# Patient Record
Sex: Female | Born: 1950
Health system: Southern US, Community
[De-identification: ages and names within clinical notes are randomized; demographics above are authoritative.]

## PROBLEM LIST (undated history)

## (undated) DIAGNOSIS — I639 Cerebral infarction, unspecified: Secondary | ICD-10-CM

## (undated) DIAGNOSIS — K9089 Other intestinal malabsorption: Secondary | ICD-10-CM

## (undated) DIAGNOSIS — I10 Essential (primary) hypertension: Secondary | ICD-10-CM

## (undated) DIAGNOSIS — D6800 Von Willebrand disease, unspecified: Secondary | ICD-10-CM

## (undated) DIAGNOSIS — T4145XA Adverse effect of unspecified anesthetic, initial encounter: Secondary | ICD-10-CM

## (undated) DIAGNOSIS — T7840XA Allergy, unspecified, initial encounter: Secondary | ICD-10-CM

## (undated) DIAGNOSIS — T8859XA Other complications of anesthesia, initial encounter: Secondary | ICD-10-CM

## (undated) DIAGNOSIS — M5410 Radiculopathy, site unspecified: Secondary | ICD-10-CM

## (undated) DIAGNOSIS — M797 Fibromyalgia: Secondary | ICD-10-CM

## (undated) DIAGNOSIS — D689 Coagulation defect, unspecified: Secondary | ICD-10-CM

## (undated) DIAGNOSIS — K219 Gastro-esophageal reflux disease without esophagitis: Secondary | ICD-10-CM

## (undated) DIAGNOSIS — F419 Anxiety disorder, unspecified: Secondary | ICD-10-CM

## (undated) DIAGNOSIS — D759 Disease of blood and blood-forming organs, unspecified: Secondary | ICD-10-CM

## (undated) DIAGNOSIS — C50919 Malignant neoplasm of unspecified site of unspecified female breast: Secondary | ICD-10-CM

## (undated) DIAGNOSIS — M1990 Unspecified osteoarthritis, unspecified site: Secondary | ICD-10-CM

## (undated) DIAGNOSIS — Z5189 Encounter for other specified aftercare: Secondary | ICD-10-CM

## (undated) DIAGNOSIS — E7800 Pure hypercholesterolemia, unspecified: Secondary | ICD-10-CM

## (undated) HISTORY — PX: SHOULDER SURGERY: SHX246

## (undated) HISTORY — PX: COSMETIC SURGERY: SHX468

## (undated) HISTORY — DX: Radiculopathy, site unspecified: M54.10

## (undated) HISTORY — PX: AUGMENTATION MAMMAPLASTY: SUR837

## (undated) HISTORY — DX: Malignant neoplasm of unspecified site of unspecified female breast: C50.919

## (undated) HISTORY — DX: Gastro-esophageal reflux disease without esophagitis: K21.9

## (undated) HISTORY — PX: HEMORRHOID SURGERY: SHX153

## (undated) HISTORY — DX: Fibromyalgia: M79.7

## (undated) HISTORY — PX: CARPAL TUNNEL RELEASE: SHX101

## (undated) HISTORY — PX: SKIN CANCER EXCISION: SHX779

## (undated) HISTORY — DX: Encounter for other specified aftercare: Z51.89

## (undated) HISTORY — PX: MASTECTOMY: SHX3

## (undated) HISTORY — DX: Anxiety disorder, unspecified: F41.9

## (undated) HISTORY — DX: Other intestinal malabsorption: K90.89

## (undated) HISTORY — DX: Von Willebrand disease, unspecified: D68.00

## (undated) HISTORY — DX: Von Willebrand's disease: D68.0

## (undated) HISTORY — PX: FINGER SURGERY: SHX640

## (undated) HISTORY — PX: LAMINECTOMY: SHX219

## (undated) HISTORY — PX: SPINE SURGERY: SHX786

## (undated) HISTORY — PX: BACK SURGERY: SHX140

## (undated) HISTORY — DX: Allergy, unspecified, initial encounter: T78.40XA

## (undated) HISTORY — PX: ABDOMINAL HYSTERECTOMY: SHX81

## (undated) HISTORY — PX: TONSILLECTOMY: SUR1361

## (undated) HISTORY — DX: Cerebral infarction, unspecified: I63.9

## (undated) HISTORY — PX: TUBAL LIGATION: SHX77

## (undated) HISTORY — DX: Coagulation defect, unspecified: D68.9

## (undated) HISTORY — DX: Pure hypercholesterolemia, unspecified: E78.00

---

## 1997-10-19 ENCOUNTER — Other Ambulatory Visit: Admission: RE | Admit: 1997-10-19 | Discharge: 1997-10-19 | Payer: Self-pay | Admitting: *Deleted

## 1998-10-27 ENCOUNTER — Other Ambulatory Visit: Admission: RE | Admit: 1998-10-27 | Discharge: 1998-10-27 | Payer: Self-pay | Admitting: *Deleted

## 1999-10-24 ENCOUNTER — Other Ambulatory Visit: Admission: RE | Admit: 1999-10-24 | Discharge: 1999-10-24 | Payer: Self-pay | Admitting: *Deleted

## 2000-06-21 ENCOUNTER — Encounter: Payer: Self-pay | Admitting: Neurological Surgery

## 2000-06-21 ENCOUNTER — Ambulatory Visit (HOSPITAL_COMMUNITY): Admission: RE | Admit: 2000-06-21 | Discharge: 2000-06-21 | Payer: Self-pay | Admitting: Neurological Surgery

## 2000-10-23 ENCOUNTER — Ambulatory Visit (HOSPITAL_COMMUNITY): Admission: RE | Admit: 2000-10-23 | Discharge: 2000-10-23 | Payer: Self-pay | Admitting: *Deleted

## 2000-10-23 ENCOUNTER — Encounter: Payer: Self-pay | Admitting: *Deleted

## 2000-10-28 ENCOUNTER — Other Ambulatory Visit: Admission: RE | Admit: 2000-10-28 | Discharge: 2000-10-28 | Payer: Self-pay | Admitting: *Deleted

## 2001-10-23 ENCOUNTER — Encounter: Payer: Self-pay | Admitting: *Deleted

## 2001-10-23 ENCOUNTER — Ambulatory Visit (HOSPITAL_COMMUNITY): Admission: RE | Admit: 2001-10-23 | Discharge: 2001-10-23 | Payer: Self-pay | Admitting: *Deleted

## 2001-10-28 ENCOUNTER — Other Ambulatory Visit: Admission: RE | Admit: 2001-10-28 | Discharge: 2001-10-28 | Payer: Self-pay | Admitting: *Deleted

## 2001-12-19 ENCOUNTER — Ambulatory Visit (HOSPITAL_COMMUNITY): Admission: RE | Admit: 2001-12-19 | Discharge: 2001-12-19 | Payer: Self-pay | Admitting: General Surgery

## 2002-10-26 ENCOUNTER — Ambulatory Visit (HOSPITAL_COMMUNITY): Admission: RE | Admit: 2002-10-26 | Discharge: 2002-10-26 | Payer: Self-pay | Admitting: *Deleted

## 2002-10-26 ENCOUNTER — Encounter: Payer: Self-pay | Admitting: *Deleted

## 2002-11-02 ENCOUNTER — Other Ambulatory Visit: Admission: RE | Admit: 2002-11-02 | Discharge: 2002-11-02 | Payer: Self-pay | Admitting: *Deleted

## 2002-12-22 ENCOUNTER — Encounter: Payer: Self-pay | Admitting: Family Medicine

## 2002-12-22 ENCOUNTER — Ambulatory Visit (HOSPITAL_COMMUNITY): Admission: RE | Admit: 2002-12-22 | Discharge: 2002-12-22 | Payer: Self-pay | Admitting: Family Medicine

## 2003-05-03 ENCOUNTER — Ambulatory Visit (HOSPITAL_COMMUNITY): Admission: RE | Admit: 2003-05-03 | Discharge: 2003-05-03 | Payer: Self-pay | Admitting: Neurological Surgery

## 2003-10-28 ENCOUNTER — Ambulatory Visit (HOSPITAL_COMMUNITY): Admission: RE | Admit: 2003-10-28 | Discharge: 2003-10-28 | Payer: Self-pay | Admitting: *Deleted

## 2003-11-04 ENCOUNTER — Other Ambulatory Visit: Admission: RE | Admit: 2003-11-04 | Discharge: 2003-11-04 | Payer: Self-pay | Admitting: *Deleted

## 2004-10-30 ENCOUNTER — Ambulatory Visit (HOSPITAL_COMMUNITY): Admission: RE | Admit: 2004-10-30 | Discharge: 2004-10-30 | Payer: Self-pay | Admitting: *Deleted

## 2004-11-16 ENCOUNTER — Other Ambulatory Visit: Admission: RE | Admit: 2004-11-16 | Discharge: 2004-11-16 | Payer: Self-pay | Admitting: *Deleted

## 2004-12-20 ENCOUNTER — Ambulatory Visit (HOSPITAL_COMMUNITY): Admission: RE | Admit: 2004-12-20 | Discharge: 2004-12-20 | Payer: Self-pay | Admitting: Otolaryngology

## 2004-12-21 ENCOUNTER — Ambulatory Visit: Payer: Self-pay | Admitting: Internal Medicine

## 2005-02-01 ENCOUNTER — Ambulatory Visit: Payer: Self-pay | Admitting: Internal Medicine

## 2005-02-06 ENCOUNTER — Ambulatory Visit: Payer: Self-pay | Admitting: Internal Medicine

## 2005-02-06 ENCOUNTER — Ambulatory Visit (HOSPITAL_COMMUNITY): Admission: RE | Admit: 2005-02-06 | Discharge: 2005-02-06 | Payer: Self-pay | Admitting: Internal Medicine

## 2005-03-13 ENCOUNTER — Ambulatory Visit: Payer: Self-pay | Admitting: Internal Medicine

## 2005-04-23 ENCOUNTER — Ambulatory Visit: Payer: Self-pay | Admitting: Internal Medicine

## 2005-04-27 ENCOUNTER — Ambulatory Visit: Payer: Self-pay | Admitting: Internal Medicine

## 2005-07-23 ENCOUNTER — Ambulatory Visit: Payer: Self-pay | Admitting: Internal Medicine

## 2005-11-16 ENCOUNTER — Ambulatory Visit (HOSPITAL_COMMUNITY): Admission: RE | Admit: 2005-11-16 | Discharge: 2005-11-16 | Payer: Self-pay | Admitting: *Deleted

## 2005-11-20 ENCOUNTER — Other Ambulatory Visit: Admission: RE | Admit: 2005-11-20 | Discharge: 2005-11-20 | Payer: Self-pay | Admitting: *Deleted

## 2005-11-29 ENCOUNTER — Ambulatory Visit (HOSPITAL_COMMUNITY): Admission: RE | Admit: 2005-11-29 | Discharge: 2005-11-29 | Payer: Self-pay | Admitting: Family Medicine

## 2005-12-26 ENCOUNTER — Ambulatory Visit: Payer: Self-pay | Admitting: Orthopedic Surgery

## 2006-01-09 ENCOUNTER — Ambulatory Visit: Payer: Self-pay | Admitting: Orthopedic Surgery

## 2006-01-14 ENCOUNTER — Ambulatory Visit (HOSPITAL_COMMUNITY): Admission: RE | Admit: 2006-01-14 | Discharge: 2006-01-14 | Payer: Self-pay | Admitting: Orthopedic Surgery

## 2006-01-24 ENCOUNTER — Ambulatory Visit: Payer: Self-pay | Admitting: Orthopedic Surgery

## 2006-02-07 ENCOUNTER — Ambulatory Visit: Payer: Self-pay | Admitting: Orthopedic Surgery

## 2006-02-13 ENCOUNTER — Ambulatory Visit (HOSPITAL_COMMUNITY): Admission: RE | Admit: 2006-02-13 | Discharge: 2006-02-13 | Payer: Self-pay | Admitting: Orthopedic Surgery

## 2006-02-15 ENCOUNTER — Ambulatory Visit: Payer: Self-pay | Admitting: Internal Medicine

## 2006-03-26 ENCOUNTER — Encounter (HOSPITAL_COMMUNITY): Admission: RE | Admit: 2006-03-26 | Discharge: 2006-04-25 | Payer: Self-pay | Admitting: Neurological Surgery

## 2006-08-01 ENCOUNTER — Ambulatory Visit (HOSPITAL_BASED_OUTPATIENT_CLINIC_OR_DEPARTMENT_OTHER): Admission: RE | Admit: 2006-08-01 | Discharge: 2006-08-02 | Payer: Self-pay | Admitting: Orthopedic Surgery

## 2006-12-06 ENCOUNTER — Ambulatory Visit (HOSPITAL_COMMUNITY): Admission: RE | Admit: 2006-12-06 | Discharge: 2006-12-06 | Payer: Self-pay | Admitting: Obstetrics and Gynecology

## 2006-12-25 ENCOUNTER — Other Ambulatory Visit: Admission: RE | Admit: 2006-12-25 | Discharge: 2006-12-25 | Payer: Self-pay | Admitting: *Deleted

## 2007-06-20 ENCOUNTER — Encounter: Payer: Self-pay | Admitting: Orthopedic Surgery

## 2007-09-09 ENCOUNTER — Ambulatory Visit (HOSPITAL_COMMUNITY): Admission: RE | Admit: 2007-09-09 | Discharge: 2007-09-09 | Payer: Self-pay | Admitting: Family Medicine

## 2008-05-21 LAB — HM COLONOSCOPY

## 2009-03-31 ENCOUNTER — Ambulatory Visit (HOSPITAL_COMMUNITY): Admission: RE | Admit: 2009-03-31 | Discharge: 2009-03-31 | Payer: Self-pay | Admitting: General Surgery

## 2009-03-31 ENCOUNTER — Encounter (INDEPENDENT_AMBULATORY_CARE_PROVIDER_SITE_OTHER): Payer: Self-pay | Admitting: General Surgery

## 2009-04-29 ENCOUNTER — Encounter (INDEPENDENT_AMBULATORY_CARE_PROVIDER_SITE_OTHER): Payer: Self-pay | Admitting: General Surgery

## 2009-04-29 ENCOUNTER — Inpatient Hospital Stay (HOSPITAL_COMMUNITY): Admission: RE | Admit: 2009-04-29 | Discharge: 2009-05-01 | Payer: Self-pay | Admitting: General Surgery

## 2009-06-20 ENCOUNTER — Ambulatory Visit (HOSPITAL_COMMUNITY): Payer: Self-pay | Admitting: Oncology

## 2009-09-01 ENCOUNTER — Ambulatory Visit (HOSPITAL_BASED_OUTPATIENT_CLINIC_OR_DEPARTMENT_OTHER): Admission: RE | Admit: 2009-09-01 | Discharge: 2009-09-02 | Payer: Self-pay | Admitting: Plastic Surgery

## 2009-09-26 ENCOUNTER — Ambulatory Visit (HOSPITAL_COMMUNITY): Payer: Self-pay | Admitting: Oncology

## 2010-01-10 ENCOUNTER — Ambulatory Visit (HOSPITAL_BASED_OUTPATIENT_CLINIC_OR_DEPARTMENT_OTHER): Admission: RE | Admit: 2010-01-10 | Discharge: 2010-01-10 | Payer: Self-pay | Admitting: Plastic Surgery

## 2010-01-19 HISTORY — PX: COLONOSCOPY: SHX174

## 2010-02-14 ENCOUNTER — Ambulatory Visit (HOSPITAL_COMMUNITY): Admission: RE | Admit: 2010-02-14 | Discharge: 2010-02-14 | Payer: Self-pay | Admitting: General Surgery

## 2010-02-18 LAB — HM MAMMOGRAPHY

## 2010-03-06 LAB — HM PAP SMEAR: HM Pap smear: NORMAL

## 2010-03-31 ENCOUNTER — Ambulatory Visit
Admission: RE | Admit: 2010-03-31 | Discharge: 2010-03-31 | Payer: Self-pay | Source: Home / Self Care | Admitting: Plastic Surgery

## 2010-06-12 ENCOUNTER — Encounter: Payer: Self-pay | Admitting: Family Medicine

## 2010-06-19 ENCOUNTER — Encounter (HOSPITAL_COMMUNITY): Admission: RE | Admit: 2010-06-19 | Payer: Self-pay | Source: Home / Self Care | Admitting: Oncology

## 2010-06-26 ENCOUNTER — Ambulatory Visit (HOSPITAL_COMMUNITY): Admitting: Oncology

## 2010-06-26 DIAGNOSIS — C50919 Malignant neoplasm of unspecified site of unspecified female breast: Secondary | ICD-10-CM

## 2010-08-01 LAB — TYPE AND SCREEN
ABO/RH(D): A POS
Antibody Screen: POSITIVE
Donor AG Type: NEGATIVE
Unit division: 0

## 2010-08-01 LAB — POCT HEMOGLOBIN-HEMACUE: Hemoglobin: 13.7 g/dL (ref 12.0–15.0)

## 2010-08-03 LAB — TYPE AND SCREEN
ABO/RH(D): A POS
Antibody Screen: POSITIVE
Donor AG Type: NEGATIVE
Donor AG Type: NEGATIVE

## 2010-08-09 LAB — POCT HEMOGLOBIN-HEMACUE: Hemoglobin: 13.9 g/dL (ref 12.0–15.0)

## 2010-08-09 LAB — TYPE AND SCREEN
ABO/RH(D): A POS
Antibody Screen: POSITIVE
Donor AG Type: NEGATIVE
Donor AG Type: NEGATIVE

## 2010-08-22 LAB — CROSSMATCH
ABO/RH(D): A POS
Antibody Screen: POSITIVE
DAT, IgG: NEGATIVE
Donor AG Type: NEGATIVE
Donor AG Type: NEGATIVE
Donor AG Type: NEGATIVE
PT AG Type: NEGATIVE

## 2010-08-22 LAB — BASIC METABOLIC PANEL
GFR calc Af Amer: >60 mL/min (ref >60)
Glucose, Bld: 103 mg/dL — ABNORMAL HIGH (ref 70–99)

## 2010-08-22 LAB — DIFFERENTIAL
Basophils Relative: 0 % (ref 0–1)
Basophils Relative: 0 % (ref 0–1)
Eosinophils Absolute: 0.3 10*3/uL (ref 0.0–0.7)
Eosinophils Absolute: 0.5 10*3/uL (ref 0.0–0.7)
Lymphocytes Relative: 23 % (ref 12–46)
Lymphocytes Relative: 24 % (ref 12–46)
Monocytes Absolute: 0.5 10*3/uL (ref 0.1–1.0)
Monocytes Absolute: 0.6 10*3/uL (ref 0.1–1.0)
Monocytes Relative: 12 % (ref 3–12)
Neutro Abs: 3 10*3/uL (ref 1.7–7.7)
Neutro Abs: 3 10*3/uL (ref 1.7–7.7)
Neutrophils Relative %: 58 % (ref 43–77)

## 2010-08-22 LAB — CBC
HCT: 31.5 % — ABNORMAL LOW (ref 36.0–46.0)
HCT: 34.5 % — ABNORMAL LOW (ref 36.0–46.0)
Hemoglobin: 10.9 g/dL — ABNORMAL LOW (ref 12.0–15.0)
Hemoglobin: 12.1 g/dL (ref 12.0–15.0)
MCHC: 34.6 g/dL (ref 30.0–36.0)
MCHC: 35 g/dL (ref 30.0–36.0)
MCV: 94 fL (ref 78.0–100.0)
RBC: 3.35 MIL/uL — ABNORMAL LOW (ref 3.87–5.11)
WBC: 5.2 10*3/uL (ref 4.0–10.5)

## 2010-08-23 LAB — APTT: aPTT: 29 seconds (ref 24–37)

## 2010-08-23 LAB — BASIC METABOLIC PANEL
BUN: 12 mg/dL (ref 6–23)
CO2: 32 mEq/L (ref 19–32)
GFR calc Af Amer: 47 mL/min — ABNORMAL LOW (ref >60)
Glucose, Bld: 104 mg/dL — ABNORMAL HIGH (ref 70–99)
Potassium: 4.3 mEq/L (ref 3.5–5.1)
Sodium: 142 mEq/L (ref 135–145)

## 2010-08-23 LAB — CBC
Hemoglobin: 13.6 g/dL (ref 12.0–15.0)
MCHC: 35 g/dL (ref 30.0–36.0)
MCV: 94.1 fL (ref 78.0–100.0)
Platelets: 203 10*3/uL (ref 150–400)
RDW: 12.6 % (ref 11.5–15.5)
WBC: 5.1 10*3/uL (ref 4.0–10.5)

## 2010-09-25 ENCOUNTER — Encounter (HOSPITAL_COMMUNITY): Attending: Oncology

## 2010-09-25 ENCOUNTER — Other Ambulatory Visit (HOSPITAL_COMMUNITY): Payer: Self-pay | Admitting: Oncology

## 2010-09-25 DIAGNOSIS — Z79899 Other long term (current) drug therapy: Secondary | ICD-10-CM | POA: Insufficient documentation

## 2010-09-25 DIAGNOSIS — Z853 Personal history of malignant neoplasm of breast: Secondary | ICD-10-CM | POA: Insufficient documentation

## 2010-09-25 DIAGNOSIS — C50919 Malignant neoplasm of unspecified site of unspecified female breast: Secondary | ICD-10-CM

## 2010-09-25 DIAGNOSIS — E7800 Pure hypercholesterolemia, unspecified: Secondary | ICD-10-CM | POA: Insufficient documentation

## 2010-09-25 DIAGNOSIS — K219 Gastro-esophageal reflux disease without esophagitis: Secondary | ICD-10-CM | POA: Insufficient documentation

## 2010-09-25 LAB — COMPREHENSIVE METABOLIC PANEL
Albumin: 3.8 g/dL (ref 3.5–5.2)
Alkaline Phosphatase: 61 U/L (ref 39–117)
BUN: 10 mg/dL (ref 6–23)
Calcium: 10.1 mg/dL (ref 8.4–10.5)
Creatinine, Ser: 0.67 mg/dL (ref 0.4–1.2)
Glucose, Bld: 89 mg/dL (ref 70–99)
Potassium: 4.2 mEq/L (ref 3.5–5.1)
Total Protein: 6.6 g/dL (ref 6.0–8.3)

## 2010-09-27 ENCOUNTER — Encounter (HOSPITAL_COMMUNITY)

## 2010-09-27 DIAGNOSIS — C50919 Malignant neoplasm of unspecified site of unspecified female breast: Secondary | ICD-10-CM

## 2010-09-27 DIAGNOSIS — M810 Age-related osteoporosis without current pathological fracture: Secondary | ICD-10-CM

## 2010-10-06 NOTE — Consult Note (Signed)
NAME:  Caroline Valdez, Caroline Valdez             ACCOUNT NO.:  [id number]   MEDICAL RECORD NO.:  [id number]          PATIENT TYPE:  AMB   LOCATION:  DAY                           FACILITY:  [facility]   PHYSICIAN:  R. Roetta Sessions, M.D. DATE OF BIRTH:  11/23/1950   DATE OF CONSULTATION:  DATE OF DISCHARGE:                                   CONSULTATION   REQUESTING PHYSICIAN:  Dr. Jearld Fenton.   REASON FOR CONSULTATION:  Refractory GERD.   HISTORY OF PRESENT ILLNESS:  Mrs. Cislo is a 60 year old Caucasian female  who presents today for evaluation of refractory GERD symptoms. She notes  about 3 months ago she began developing frequent throat clearing, some  coughing and strangulation, not associated with eating.  She was having  between five and six episodes a day.  She was seen by Dr. Jearld Fenton at  [facility], Nose and Thro[facility].  He felt that some of her symptoms were  related to laryngospasm and GERD.  She does have a history of similar type  of symptoms back 8 years ago.  She underwent an EGD by Dr. Jena Gauss at that  time and was found to have a normal exam.  This was then followed by  manometry which showed moderate correlation with belching and reflux  episodes during the pH study.  However, pH study was normal.  So basically  she has a history of NERD.  She complains of refractory heartburn and  indigestion over the last 3 months.  She was started on b.i.d. Nexium 40 mg  over the last couple of weeks.  She notes pill dysphagia.  She denies any  problems with solids or liquids.  She denies any odynophagia.  She has also  been evaluated by a pulmonologist with a reportedly normal exam.  She denies  any nausea or vomiting.  She does have fleeting left lower quadrant  abdominal pain with a history of IBS, constipation predominant, which  responds well to stool softeners.  She does take an occasional Dulcolax.  She noted small fresh, bright red blood after stooling with known  hemorrhoidal disease and denies  any melena.  Reportedly she has had a  colonoscopy by Dr. Lovell Sheehan within the last 5 years which is normal.   PAST MEDICAL HISTORY:  Chronic GERD.  She is being evaluated at [facility] for a  platelet disorder felt to be possible von Willebrand's.  Hemorrhoid disease  followed by Dr. Lovell Sheehan with the last colonoscopy normal within the last 5  years.  IBS, fibromyalgia, hemorrhoidectomy times two, back surgery and  chronic back pain, ruptured disk, complete hysterectomy, tonsillectomy,  bilateral benign breast biopsies.   CURRENT MEDICATIONS:  1.  Nexium 40 mg b.i.d.  2.  Zantac 75 mg 2 q.h.s.  3.  Centrum Silver daily.  4.  Black cohosh 540 mg daily.  5.  Calcium 600 mg with vitamin D daily.  6.  Dulcolax p.r.n.  7.  Stool softeners p.r.n.   ALLERGIES:  Aspirin.   FAMILY HISTORY:  Noncontributory.   SOCIAL HISTORY:  Mrs. Cortese has been married for 30 plus years.  She  has  two grown, healthy children.  She is currently unemployed.  She denies any  tobacco, alcohol or drug use.   REVIEW OF SYSTEMS:  CONSTITUTIONAL:  Weight is stable.  Denies any anorexia.  Denies any early satiety.  Denies any fatigue.  Denies any fever or chills.  CARDIOVASCULAR:  Denies chest pain or palpitations.  PULMONARY:  She does  have a nonproductive cough as described in the HPI.  She denies any  hemoptysis or shortness of breath.  GI:  See HPI.   PHYSICAL EXAMINATION:  VITAL SIGNS:  Weight 129 pounds.  Height 66 inches.  Temperature 98.2, blood pressure 118/64.  Pulse 72.  GENERAL:  Mrs. Lacerte is a well-developed, well-nourished Caucasian female  in no acute distress.  HEENT:  Sclerae are clear and nonicteric.  The conjunctivae are pink.  The  oropharynx is pink and moist without lesions.  NECK:  Supple without mass or thyromegaly.  CHEST:  Heart has regular rate and rhythm with normal S1, S2 without  murmurs, rubs or gallops.  LUNGS:  Clear to auscultation bilaterally.  ABDOMEN:  Positive bowel sounds  times four.  No bruits auscultated.  Soft,  nontender, nondistended.  No mass or hepatosplenomegaly.  No rebound  tenderness or guarding.  EXTREMITIES:  Without clubbing or edema bilaterally.  SKIN:  Pink, warm and dry without any rash or jaundice.  RECTAL:  Deferred.   IMPRESSION:  Mrs. Natal is a 60 year old Caucasian female with a 67-month  history of refractory gastroesophageal reflux disease type of symptoms, most  of her symptoms consisting of frequent throat clearing, strangulation  episodes, heartburn and indigestion.  Her symptoms are suspicious for  laryngopharyngeal reflux.  She also notes pill dysphagia.  I feel further  evaluation is warranted at this time, given the worsening of her symptoms  and she has inadequate relief on b.i.d. PPI as well as nighttime H2 blocker.   She also has a history of irritable bowel syndrome, constipation based, and  is requesting help with this today too.   PLAN:  1.  We will schedule an EGD with Dr. Jena Gauss in the near future.  I discussed      this procedure including risks and benefits to include but not limited      to bleeding, infection, perforation, drug reaction, __________ consent      will be obtained.  2.  Prescription for Maalox 17 gm daily as directed, quantity sufficient      times a month with one refill.  3.  She is to continue Nexium 40 mg b.i.d. and Zantac 75, 2 at bedtime, for      now until EGD.  4.  Further recommendations pending procedure.  5.  She is going to check on her last colonoscopy, given her history of      small volume intermittent hematochezia      suspected to be due to hemorrhoidal disease.  However, she is due for a      colonoscopy.  She is going to give [place] a call so this can be done at the      same time.   We would like to thank Dr. Jearld Fenton for allowing [place] to participate in the care  of Mrs. Deal.      Nicholas Lose, N.P.     Jonathon Bellows, M.D.  Electronically Signed     KC/MEDQ  D:  02/01/2005  T:  02/01/2005  Job:  [id number]   cc:   Jonny Ruiz  Jearld Fenton, M.D.  [address] [place]  [place] [address]  Fax: [phone]

## 2010-10-06 NOTE — Op Note (Signed)
NAMESHELAGH, RAYMAN             ACCOUNT NO.:  [id number]   MEDICAL RECORD NO.:  [id number]          PATIENT TYPE:  AMB   LOCATION:  DSC                          FACILITY:  [facility]   PHYSICIAN:  Katy Fitch. Sypher, M.D. DATE OF BIRTH:  1950-08-05   DATE OF PROCEDURE:  08/01/2006  DATE OF DISCHARGE:  08/02/2006                               OPERATIVE REPORT   PREOPERATIVE DIAGNOSIS:  Painful right shoulder due to adhesive  capsulitis and chronic stage II impingement with probable  acromioclavicular arthropathy.   POSTOPERATIVE DIAGNOSIS:  Painful right shoulder due to adhesive  capsulitis and chronic stage II impingement with probable  acromioclavicular arthropathy with confirmation of extensive adhesive  capsulitis, acromioclavicular arthropathy and extensive bursal-side  degenerative changes and fraying of supraspinatus and infraspinatus  tendons.   OPERATION/PROCEDURE:  1. Examination right shoulder under anesthesia.  2. Diagnostic arthroscopy, right glenohumeral joint with debridement      of labrum, adhesive capsulitis tissues, and confirmation of joint      side intact rotator cuff tendons.  3. Subacromial decompression with extensive bursectomy and lysis of      adhesions between acromion, acromioclavicular joint capsule and      coracoacromial ligament with rotator cuff followed by subacromial      decompression by acromioplasty, coracoacromial ligament release and      acromioplasty.  4. Arthroscopic resection of distal clavicle.   SURGEON:  Katy Fitch. Sypher, M.D.   ASSISTANT:  Molly Maduro Dasnoit PA-C.   ANESTHESIA:  General endotracheal without supplemental interscalene  block.  Supervising anesthesiologist, Janetta Hora. Gelene Mink, M.D.   INDICATIONS:  Talynn Lebon is a 60 year old woman referred to the  courtesy of Dr. Barnett Abu for evaluation and management of a  chronically stiff and painful right shoulder.  Mrs. Hubka had had pain  in her shoulder dating  back to June 2007.  She was initially evaluated  by Dr. Romeo Apple in [place] and had an MRI of the shoulder in August  2007.  The MRI revealed extensive rotator cuff tendinopathy, subacromial  and subdeltoid bursitis and unfavorable acromial anatomy.   Dr. Danielle Dess evaluated Ms. Hands for cervical degenerative disk disease.  Upon review of her exam, Dr. Danielle Dess identified signs of chronic  impingement and requested upper extremity orthopedic consult.  Mrs.  Narayanan was seen in February 2008.  At the time of her consult she  reported that she had a history of a bleeding diathesis.  She has been  evaluated at [facility] and has been advised that she probably has  a variant of von Willebrand disease.  She has been advised to use DDAVP  preoperatively by her consultants at  [facility].  With prior  neurosurgery and abdominal surgery, she has used the DDAVP successfully.  Preoperatively, an anesthesia consult was obtained with Dr. Gelene Mink.  We discussed the von Willebrand predicament and decided to provide DDAVP  in an effort to diminish our risk of bleeding.   After informed consent, Mrs. Arnell was brought to the operating room  at this time.   DESCRIPTION OF PROCEDURE:  Schwanda Zima was brought  to the operating  room and placed in supine position on the table.  After anesthesia  consult with Dr. Gelene Mink due to her history of von Willebrand studies,  it was elected not to proceed with an interscalene block out of fear of  bleeding complications.  Her DDAVP was dosed accordingly by the pharmacy  and provided 30 minutes prior to entering the operating room.   After informed consent, she was brought to room 8, placed in supine  position on the operating table and under Dr. Thornton Dales strict  supervision, general endotracheal anesthesia induced.  She then  carefully positioned in the beach-chair position with a __________  torso and head holder designed for shoulder  arthroscopy.  The entire  right upper extremity and forequarter were prepped with DuraPrep and  draped with impervious arthroscopy drapes.  Examination of the shoulder  under anesthesia revealed elevation 160 degrees, external rotation of 75  degrees, internal of 50 degrees.  She was noted to have moderate  adhesive capsulitis.  She had clear impingement signs beneath the [facility]  joint and acromion.   The scope was placed through a standard posterior viewing portal with a  blunt trocar.  Subsequent diagnostic arthroscopy confirmed profound  adhesive capsulitis with angry appearing hypervascular scar tissue  covering the surface of the subscapularis anterior glenohumeral  ligaments and rotator interval.  This was documented with a digital  camera.  The origin of the biceps at the superior labrum was intact.  Biceps tendon was normal through the rotator interval.  The deep surface  of the subscapularis, supraspinatus, infraspinatus, teres minor was  noted be normal.   An anterior portal was created under direct vision followed by use of  the suction shaver to debride the labrum and to remove the adhesive  capsulitis granulations.  A bipolar cautery was used for hemostasis and  for lysis of the adhesions between the subscapularis and the capsular  ligaments as well as the capsule in the anterior surface of  subscapularis.   After completion of the intra-articular debridement, the scope was  removed and placed in subacromial space.   The subacromial space was obliterated by adhesions between the acromion  and rotator cuff.  After use of cutting cautery to take down adhesions,  a suction shaver was used to debride redundant tissue followed by  inspection of the rotator cuff.  The cuff was extremely abraded on its  bursal surface, primarily the entire supraspinatus tendon and a portion  of the infraspinatus and the critical zone.  After debridement of the rough surface of the tendons, the  capsule of  the [facility] joint was inspected.  The capsule was violated due to chronic sub  AC impingement.  The coracoacromial ligament was relaxed with the  cutting cautery followed by hemostasis with bipolar cautery.  The AC  capsule was resected with the cutting [employer] wand followed by use of  a suction shaver to remove the distal 15 mm of clavicle and leveling the  acromion to type 1 morphology.  After final hemostasis and photographic  documentation of the  decompression, the arthroscopic equipment was  removed.   Our final diagnosis was subacromioclavicular joint and subcoracoacromial  impingement aggravated by adhesive capsulitis.   The rotator cuff tendon was found be intact on its bursal side but  significantly frayed at superficial 50% of the thickness of the tendon  in the critical zone.  These findings correlate well with the  preoperative MRI report.   The arthroscopic  equipment was removed and the portals repaired with  mattress suture of 3-0 Prolene.  Mrs. Tawney was awakened from general  anesthesia and transferred to the recovery room with stable vital signs.  She will be admitted to the recovery care center for observation of her  vital signs, appropriate analgesics in the form of p.o. and IV Dilaudid,  p.o. Percocet and possible use of IV PCA morphine.   It should be noted that passive calf compression pumps were used  throughout the procedure for deep vein thrombosis prophylaxis and we  anticipate that she will be able to begin immediate mobilization in the  postoperative period in the recovery care center.      Katy Fitch Sypher, M.D.  Electronically Signed     RVS/MEDQ  D:  08/01/2006  T:  08/03/2006  Job:  [id number]   cc:   Stefani Dama, M.D.

## 2010-11-08 ENCOUNTER — Encounter: Payer: Self-pay | Admitting: Family Medicine

## 2010-11-08 DIAGNOSIS — M797 Fibromyalgia: Secondary | ICD-10-CM

## 2010-11-08 DIAGNOSIS — N809 Endometriosis, unspecified: Secondary | ICD-10-CM | POA: Insufficient documentation

## 2010-11-08 DIAGNOSIS — M5410 Radiculopathy, site unspecified: Secondary | ICD-10-CM

## 2011-04-03 DIAGNOSIS — C50919 Malignant neoplasm of unspecified site of unspecified female breast: Secondary | ICD-10-CM | POA: Insufficient documentation

## 2011-06-20 ENCOUNTER — Encounter (HOSPITAL_COMMUNITY): Payer: Self-pay | Admitting: Oncology

## 2011-06-20 ENCOUNTER — Encounter (HOSPITAL_COMMUNITY): Attending: Oncology | Admitting: Oncology

## 2011-06-20 DIAGNOSIS — D0590 Unspecified type of carcinoma in situ of unspecified breast: Secondary | ICD-10-CM

## 2011-06-20 DIAGNOSIS — Z9010 Acquired absence of unspecified breast and nipple: Secondary | ICD-10-CM

## 2011-06-20 DIAGNOSIS — D680 Von Willebrand's disease: Secondary | ICD-10-CM

## 2011-06-20 DIAGNOSIS — M810 Age-related osteoporosis without current pathological fracture: Secondary | ICD-10-CM

## 2011-06-20 NOTE — Patient Instructions (Signed)
[facility]  Discharge Instructions  RECOMMENDATIONS MADE BY THE CONSULTANT AND ANY TEST RESULTS WILL BE SENT TO YOUR REFERRING DOCTOR.   EXAM FINDINGS BY MD TODAY AND SIGNS AND SYMPTOMS TO REPORT TO CLINIC OR PRIMARY MD: We will get you scheduled for a Bone Scan with Dr.Bertrand in March 2013. We will get you scheduled for lab work and Zometa in may 2013. Return to clinic in 1 year to see MD.     I acknowledge that I have been informed and understand all the instructions given to me and received a copy. I do not have any more questions at this time, but understand that I may call the Specialty Clinic at [facility] at [phone] during business hours should I have any further questions or need assistance in obtaining follow-up care.    __________________________________________  _____________  __________ Signature of Patient or Authorized Representative            Date                   Time    __________________________________________ Nurse's Signature

## 2011-06-20 NOTE — Progress Notes (Signed)
CC:   Claude Manges, MD Dalia Heading, M.D.  DIAGNOSIS: 1. Ductal carcinoma in situ of left breast with suspicious but not     definite cells for microinvasion.  The DCIS was ER positive, PR     negative but felt to be high-grade and she had a mastectomy on     04/29/2009.  Seven nodes were found and all negative.  The DCIS was     1.2 cm in size on the November pathology report with the initial     biopsy and then at mastectomy there was another 8 mm of DCIS found.     The original biopsy took place actually in late October 2010.  She     did not want to pursue adjuvant hormonal therapy.  She has been     observed since without recurrence. 2. Right breast biopsy years ago. 3. Right breast and left breast reconstruction by Dr. Brantley Persons in [place]. 4. History of von Willebrand disease diagnosed by Dr. Kerry Dory at [facility] and she has been treated with DDAVP prior to surgery     with no problems thereafter. 5. Tonsillectomy 1968. 6. Dilation and curettage in 1974. 7. Right breast biopsy years ago. 8. Hemorrhoidectomy in 1988 with a repeat in 1999. 9. Colonoscopy in 2001 and she also states that she had 1 more     recently. 10.Right shoulder surgery in 2008. 11.Ruptured disk with laminectomy in 1989. 12.Hypercholesterolemia. 13.Gastroesophageal reflux disease. 14.Family history of breast cancer in her mother and a sister at age     92 who had bilateral mastectomies.  I do not think any of the     family members have been tested for BRCA1 or BRCA2.  Jadynn is here today doing very, very well, but she needs a bone density in March which we will schedule.  She will have a C-MET in May and then Zometa which she takes once a year.  She does have documented osteoporosis and she is on calcium and vitamin D.  REVIEW OF SYSTEMS:  Otherwise negative.  PHYSICAL EXAMINATION:  Lungs:  Her lungs are very clear.  She has no adenopathy in any location.   Heart:  Shows a regular rhythm and rate without murmur, rub or gallop.  Breasts:  Both breasts are negative for masses.  Abdomen:  Soft, nontender, without organomegaly.  She has no peripheral edema.  So will see her back in a year, but she will come in May for the Reclast.  We will see her sooner if need be.  Her vital signs today were excellent, weight stable at 133 pounds.   ______________________________ Ladona Horns. Mariel Sleet, MD ESN/MEDQ  D:  06/20/2011  T:  06/20/2011  Job:  [id number]

## 2011-06-20 NOTE — Progress Notes (Signed)
This office note has been dictated.

## 2011-06-22 ENCOUNTER — Telehealth (HOSPITAL_COMMUNITY): Payer: Self-pay

## 2011-06-22 NOTE — Telephone Encounter (Signed)
Taking Calcium 1000 mg daily and Vitamin D 1000 units daily.

## 2011-06-22 NOTE — Telephone Encounter (Signed)
Message copied by [name] on Fri Jun 22, 2011  1:01 PM ------      Message from: Mariel Sleet, ERIC S      Created: Wed Jun 20, 2011  5:20 PM       Please call her and verify her dose of Ca+ and if she is on Vit D and how much??

## 2011-09-03 DIAGNOSIS — E785 Hyperlipidemia, unspecified: Secondary | ICD-10-CM | POA: Insufficient documentation

## 2011-09-03 DIAGNOSIS — D6801 Von willebrand disease, type 1: Secondary | ICD-10-CM | POA: Insufficient documentation

## 2011-09-27 ENCOUNTER — Other Ambulatory Visit (HOSPITAL_COMMUNITY): Payer: Self-pay

## 2011-09-27 DIAGNOSIS — D0510 Intraductal carcinoma in situ of unspecified breast: Secondary | ICD-10-CM

## 2011-10-04 ENCOUNTER — Encounter (HOSPITAL_COMMUNITY): Attending: Oncology

## 2011-10-04 DIAGNOSIS — D0510 Intraductal carcinoma in situ of unspecified breast: Secondary | ICD-10-CM

## 2011-10-04 DIAGNOSIS — M810 Age-related osteoporosis without current pathological fracture: Secondary | ICD-10-CM | POA: Insufficient documentation

## 2011-10-04 LAB — COMPREHENSIVE METABOLIC PANEL
ALT: 18 U/L (ref 0–35)
AST: 26 U/L (ref 0–37)
Albumin: 4.2 g/dL (ref 3.5–5.2)
Alkaline Phosphatase: 58 U/L (ref 39–117)
BUN: 10 mg/dL (ref 6–23)
Chloride: 102 mEq/L (ref 96–112)
Potassium: 4 mEq/L (ref 3.5–5.1)
Sodium: 139 mEq/L (ref 135–145)
Total Bilirubin: 0.7 mg/dL (ref 0.3–1.2)
Total Protein: 7.1 g/dL (ref 6.0–8.3)

## 2011-10-04 LAB — CBC
HCT: 40 % (ref 36.0–46.0)
MCH: 31.5 pg (ref 26.0–34.0)
MCV: 92.6 fL (ref 78.0–100.0)
Platelets: 195 10*3/uL (ref 150–400)
RDW: 12.4 % (ref 11.5–15.5)
WBC: 5 10*3/uL (ref 4.0–10.5)

## 2011-10-04 NOTE — Progress Notes (Signed)
Labs drawn today for cbc,cmp 

## 2011-10-08 ENCOUNTER — Telehealth (HOSPITAL_COMMUNITY): Payer: Self-pay | Admitting: Oncology

## 2011-10-08 NOTE — Telephone Encounter (Signed)
[id number] ?'D IF REFERRAL FOR OV DOS 06/20/11 WAS OBTAINED. PER CSR A REF WAS NOT OBTAINED FOR THE  VISIT. I WAS ADVISED THAT THE INITIAL VISIT HAS TO HAVE A REFERRAL DONE BY PT'S PCP AND ARE GOOD FOR 365 DAYS. DURING THAT 365 DAY PERIOD THE SPECIALIST  CAN REQUEST REFERRALS FOR  SERVICE THEY ORDER. HOWEVER, AFTER 365 DAYS THE PT MUST GO BACK TO THEIR PCP TO OBTAIN ANOTHER REFERRAL TO SEE THE SPECIALIST.

## 2011-10-09 ENCOUNTER — Telehealth (HOSPITAL_COMMUNITY): Payer: Self-pay | Admitting: Oncology

## 2011-10-11 ENCOUNTER — Encounter (HOSPITAL_BASED_OUTPATIENT_CLINIC_OR_DEPARTMENT_OTHER)

## 2011-10-11 DIAGNOSIS — M810 Age-related osteoporosis without current pathological fracture: Secondary | ICD-10-CM

## 2011-10-11 MED ORDER — ZOLEDRONIC ACID 5 MG/100ML IV SOLN
5.0000 mg | Freq: Once | INTRAVENOUS | Status: AC
  Administered 2011-10-11: 5 mg via INTRAVENOUS

## 2011-10-11 MED ORDER — SODIUM CHLORIDE 0.9 % IJ SOLN
INTRAMUSCULAR | Status: AC
  Filled 2011-10-11: qty 10

## 2011-10-11 MED ORDER — ZOLEDRONIC ACID 5 MG/100ML IV SOLN
INTRAVENOUS | Status: AC
  Filled 2011-10-11: qty 100

## 2011-10-11 MED ORDER — SODIUM CHLORIDE 0.9 % IV SOLN
Freq: Once | INTRAVENOUS | Status: AC
  Administered 2011-10-11: 11:00:00 via INTRAVENOUS

## 2011-10-11 NOTE — Progress Notes (Signed)
Tolerated well

## 2011-11-01 ENCOUNTER — Encounter: Payer: Self-pay | Admitting: Oncology

## 2012-03-04 ENCOUNTER — Other Ambulatory Visit: Payer: Self-pay | Admitting: Family Medicine

## 2012-03-04 DIAGNOSIS — Z139 Encounter for screening, unspecified: Secondary | ICD-10-CM

## 2012-03-07 ENCOUNTER — Other Ambulatory Visit: Payer: Self-pay | Admitting: Family Medicine

## 2012-03-07 ENCOUNTER — Ambulatory Visit (HOSPITAL_COMMUNITY)
Admission: RE | Admit: 2012-03-07 | Discharge: 2012-03-07 | Disposition: A | Discharge status: Home / Self Care | Source: Ambulatory Visit | Attending: Family Medicine | Admitting: Family Medicine

## 2012-03-07 DIAGNOSIS — Z139 Encounter for screening, unspecified: Secondary | ICD-10-CM

## 2012-03-07 DIAGNOSIS — Z853 Personal history of malignant neoplasm of breast: Secondary | ICD-10-CM | POA: Insufficient documentation

## 2012-03-07 DIAGNOSIS — Z803 Family history of malignant neoplasm of breast: Secondary | ICD-10-CM | POA: Insufficient documentation

## 2012-06-18 ENCOUNTER — Ambulatory Visit (HOSPITAL_COMMUNITY): Admitting: Oncology

## 2012-06-30 ENCOUNTER — Encounter (HOSPITAL_COMMUNITY): Payer: PRIVATE HEALTH INSURANCE | Attending: Oncology | Admitting: Oncology

## 2012-06-30 ENCOUNTER — Encounter (HOSPITAL_COMMUNITY): Payer: Self-pay | Admitting: Oncology

## 2012-06-30 VITALS — BP 124/80 | HR 86 | Temp 97.8°F | Resp 16 | Wt 127.3 lb

## 2012-06-30 DIAGNOSIS — R079 Chest pain, unspecified: Secondary | ICD-10-CM

## 2012-06-30 DIAGNOSIS — R0789 Other chest pain: Secondary | ICD-10-CM

## 2012-06-30 DIAGNOSIS — Z170 Estrogen receptor positive status [ER+]: Secondary | ICD-10-CM

## 2012-06-30 DIAGNOSIS — F329 Major depressive disorder, single episode, unspecified: Secondary | ICD-10-CM

## 2012-06-30 DIAGNOSIS — D0512 Intraductal carcinoma in situ of left breast: Secondary | ICD-10-CM

## 2012-06-30 DIAGNOSIS — D0590 Unspecified type of carcinoma in situ of unspecified breast: Secondary | ICD-10-CM

## 2012-06-30 NOTE — Progress Notes (Signed)
Problem number 1 DCIS of the left breast with suspicious but not definite cells for possible microinvasion. The DCIS was ER positive, PR negative but felt to be high-grade status post mastectomy on 04/30/1999 and with subsequent bilateral reconstruction with silicone implants on both sides. The DCIS was 1.2 cm in size and at the time of mastectomy there was another 8 mm of DCIS found. The original biopsy took place in late October 2010. She does not want to pursue adjuvant hormonal therapy. Thus far she was without recurrence. Problem #2 right sided chest pain after leaning on the side of a chair last Tuesday or Wednesday afternoon, she then felt a pop in her right breast/chest wall area with excruciating pain occurring Friday evening without relief by Vicodin though it has slowly gotten better. She actually thinks the pain is more in the breast area but on physical exam it feels as if it may be one of the ribs medially located just below the breast in the inner lower quadrant. I think we need to make sure that this is not a implant rupture. Problem #3 von Willebrand's disease diagnosed by Dr. Kerry Dory at [facility] and treated with DDAVP prior to surgery in the past Problem #4 depression, reactive secondary to the death of her husband from acute leukemia 5 weeks ago. He had a long-standing history of MDS before that Problem #5 history of colonoscopy in 2001 and 2011 Problem #6 right shoulder surgery 2008 Problem #7 ruptured disc with laminectomy in 1989 Problem #8 right breast biopsy many years ago Problem #9 D&C in 1974 Problem #10 history of tonsillectomy 1968 Problem #11 history of DCIS and her mother and also in a sister at age 80 status post bilateral mastectomies and her sister. She tells me today that one of her sisters granddaughters was diagnosed with ovarian cancer at age 50. She is not sure whether her great niece has been tested for BRCA1 or BRCA2. Except for the pain in the right rib  cage mentioned above she has been doing very well except for the also loss of her husband from acute leukemia. He had been fighting that for 2-3 years but prior to that had a history of MDS for many years.  She is having a tough time getting by at looks good. She is sleeping at times. Her weight is down 6 pounds however since a year ago.  Oncology review of systems is negative. Lungs are clear. She has no lymphadenopathy in any location. Both reconstructed breasts show no distinct masses but she is very tender especially down at the rib cage level medial lower quadrant of the right breast at approximately the costal chondral junction. Approximately the fifth rib. She has normal heart exam. Abdomen is soft and nontender. She has no leg edema and no arm edema. The left reconstructed breast shows no masses either.  I do think we need to exclude rupture of the implant on the right. There are no ecchymoses over the scan etc. If all is well we will see her back in a year.

## 2012-06-30 NOTE — Patient Instructions (Addendum)
[facility] Discharge Instructions  RECOMMENDATIONS MADE BY THE CONSULTANT AND ANY TEST RESULTS WILL BE SENT TO YOUR REFERRING PHYSICIAN.  EXAM FINDINGS BY THE PHYSICIAN TODAY AND SIGNS OR SYMPTOMS TO REPORT TO CLINIC OR PRIMARY PHYSICIAN: Exam and discussion by MD.  Will do ultrasound of your right breast to make sure the implant is ok.  MEDICATIONS PRESCRIBED:  none  INSTRUCTIONS GIVEN AND DISCUSSED: Report any new lumps, bone pain or shortness of breath.  SPECIAL INSTRUCTIONS/FOLLOW-UP: Ultrasound of your right breast and follow-up in 1 year.  Thank you for choosing [facility] to provide your oncology and hematology care.  To afford each patient quality time with our providers, please arrive at least 15 minutes before your scheduled appointment time.  With your help, our goal is to use those 15 minutes to complete the necessary work-up to ensure our physicians have the information they need to help with your evaluation and healthcare recommendations.    Effective January 1st, 2014, we ask that you re-schedule your appointment with our physicians should you arrive 10 or more minutes late for your appointment.  We strive to give you quality time with our providers, and arriving late affects you and other patients whose appointments are after yours.    Again, thank you for choosing [facility].  Our hope is that these requests will decrease the amount of time that you wait before being seen by our physicians.       _____________________________________________________________  Should you have questions after your visit to [facility], please contact our office at [phone] between the hours of 8:30 a.m. and 5:00 p.m.  Voicemails left after 4:30 p.m. will not be returned until the following business day.  For prescription refill requests, have your pharmacy contact our office with your prescription refill request.

## 2012-07-02 ENCOUNTER — Other Ambulatory Visit (HOSPITAL_COMMUNITY): Payer: PRIVATE HEALTH INSURANCE

## 2012-07-09 ENCOUNTER — Ambulatory Visit (HOSPITAL_COMMUNITY)
Admission: RE | Admit: 2012-07-09 | Discharge: 2012-07-09 | Disposition: A | Discharge status: Home / Self Care | Source: Ambulatory Visit | Attending: Family Medicine | Admitting: Family Medicine

## 2012-07-09 ENCOUNTER — Ambulatory Visit (HOSPITAL_COMMUNITY)
Admission: RE | Admit: 2012-07-09 | Discharge: 2012-07-09 | Disposition: A | Discharge status: Home / Self Care | Source: Ambulatory Visit | Attending: Oncology | Admitting: Oncology

## 2012-07-09 ENCOUNTER — Other Ambulatory Visit (HOSPITAL_COMMUNITY): Payer: Self-pay | Admitting: Family Medicine

## 2012-07-09 DIAGNOSIS — N6310 Unspecified lump in the right breast, unspecified quadrant: Secondary | ICD-10-CM

## 2012-07-09 DIAGNOSIS — D0512 Intraductal carcinoma in situ of left breast: Secondary | ICD-10-CM

## 2012-07-09 DIAGNOSIS — N644 Mastodynia: Secondary | ICD-10-CM | POA: Insufficient documentation

## 2012-07-09 DIAGNOSIS — Z853 Personal history of malignant neoplasm of breast: Secondary | ICD-10-CM | POA: Insufficient documentation

## 2012-07-09 DIAGNOSIS — R0789 Other chest pain: Secondary | ICD-10-CM

## 2012-08-18 ENCOUNTER — Other Ambulatory Visit: Payer: Self-pay | Admitting: Family Medicine

## 2012-09-16 ENCOUNTER — Encounter: Payer: Self-pay | Admitting: Family Medicine

## 2012-09-16 ENCOUNTER — Ambulatory Visit (INDEPENDENT_AMBULATORY_CARE_PROVIDER_SITE_OTHER): Admitting: Family Medicine

## 2012-09-16 VITALS — BP 120/72 | HR 100 | Temp 98.0°F | Resp 16 | Wt 126.0 lb

## 2012-09-16 DIAGNOSIS — E7800 Pure hypercholesterolemia, unspecified: Secondary | ICD-10-CM | POA: Insufficient documentation

## 2012-09-16 DIAGNOSIS — F4321 Adjustment disorder with depressed mood: Secondary | ICD-10-CM

## 2012-09-16 DIAGNOSIS — D0512 Intraductal carcinoma in situ of left breast: Secondary | ICD-10-CM | POA: Insufficient documentation

## 2012-09-16 DIAGNOSIS — Z0000 Encounter for general adult medical examination without abnormal findings: Secondary | ICD-10-CM

## 2012-09-16 LAB — BASIC METABOLIC PANEL
BUN: 11 mg/dL (ref 6–23)
CO2: 29 mEq/L (ref 19–32)
Calcium: 9.3 mg/dL (ref 8.4–10.5)
Chloride: 103 mEq/L (ref 96–112)
Creat: 0.72 mg/dL (ref 0.50–1.10)

## 2012-09-16 LAB — CBC WITH DIFFERENTIAL/PLATELET
Basophils Relative: 1 % (ref 0–1)
Eosinophils Absolute: 0.1 10*3/uL (ref 0.0–0.7)
Hemoglobin: 13.8 g/dL (ref 12.0–15.0)
MCH: 31.2 pg (ref 26.0–34.0)
MCHC: 34.2 g/dL (ref 30.0–36.0)
Neutro Abs: 2.3 10*3/uL (ref 1.7–7.7)
Neutrophils Relative %: 54 % (ref 43–77)
Platelets: 197 10*3/uL (ref 150–400)
RBC: 4.43 MIL/uL (ref 3.87–5.11)

## 2012-09-16 LAB — LIPID PANEL
Cholesterol: 179 mg/dL (ref 0–200)
HDL: 56 mg/dL (ref >39)
Total CHOL/HDL Ratio: 3.2 Ratio
Triglycerides: 121 mg/dL (ref <150)
VLDL: 24 mg/dL (ref 0–40)

## 2012-09-16 LAB — HEPATIC FUNCTION PANEL
Albumin: 4.5 g/dL (ref 3.5–5.2)
Bilirubin, Direct: 0.2 mg/dL (ref 0.0–0.3)
Total Bilirubin: 0.8 mg/dL (ref 0.3–1.2)

## 2012-09-16 MED ORDER — ALPRAZOLAM 0.5 MG PO TABS: 0.5000 mg | ORAL_TABLET | Freq: Every evening | ORAL | Status: DC | PRN

## 2012-09-16 NOTE — Progress Notes (Signed)
Subjective:    Patient ID: Caroline Valdez, female    DOB: July 24, 1950, 62 y.o.   MRN: [id number]  HPI Sadly the patient's husband recently died due to myelodysplastic syndrome in January. She is still dealing with a great deal of grief. 2-3 nights a week she cannot sleep. She would like Xanax to be used sparingly to help with the insomnia. She denies depression, anhedonia, or panic attacks. She denies suicidal ideation.  Otherwise she is doing well with no specific complaints. Past Medical History  Diagnosis Date  . Fibromyalgia   . 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 Endometriosis   . Radiculopathy   . GERD (gastroesophageal reflux disease)   . Von Willebrand's disease     history of  . Hypercholesteremia   . Breast cancer    Current Outpatient Prescriptions on File Prior to Visit  Medication Sig Dispense Refill  . calcium carbonate (OS-CAL) 600 MG TABS Take 600 mg by mouth daily.       . DiphenhydrAMINE HCl, Sleep, (ZZZQUIL) 25 MG CAPS Take 25 mg by mouth at bedtime as needed. Takes 2 at bedtime as needed.      Marland Kitchen HYDROcodone-acetaminophen (VICODIN) 5-500 MG per tablet Take 1 tablet by mouth every 6 (six) hours as needed.        Marland Kitchen NEXIUM 40 MG capsule TAKE 1 CAPSULE BY MOUTH EVERY DAY  90 capsule  3  . simvastatin (ZOCOR) 20 MG tablet TAKE 1 TABLET BY MOUTH AT BEDTIME  90 tablet  1  . zinc sulfate 220 MG capsule Take 220 mg by mouth daily.        . zoledronic acid (RECLAST) 5 MG/100ML SOLN Inject 5 mg into the vein once. yearly       No current facility-administered medications on file prior to visit.   Allergies  Allergen Reactions  . Asa Buff (Mag (Buffered Aspirin)    History   Social History  . Marital Status: Married    Spouse Name: N/A    Number of Children: N/A  . Years of Education: N/A   Occupational History  . Not on file.   Social History Main Topics  . Smoking status: Never Smoker   . Smokeless tobacco: Never Used  . Alcohol Use: No  . Drug  Use: No  . Sexually Active: Not on file     Comment: husband recently died from myelodysplastic syndrome in January of 2014   Other Topics Concern  . Not on file   Social History Narrative  . No narrative on file   Family History  Problem Relation Age of Onset  . Cancer Mother     breast cancer  . Stroke Father   . Cancer Sister     breast cancer    Colonoscopy is not due until 2020 Zostavax was given in 2012 Pneumovax was given in 2012 Tetanus was given in 2009  Review of Systems  All other systems reviewed and are negative.       Objective:   Physical Exam  Constitutional: She is oriented to person, place, and time. She appears well-developed and well-nourished.  HENT:  Head: Normocephalic and atraumatic.  Right Ear: External ear normal.  Left Ear: External ear normal.  Nose: Nose normal.  Mouth/Throat: Oropharynx is clear and moist.  Eyes: Conjunctivae and EOM are normal. Pupils are equal, round, and reactive to light. Right eye exhibits no discharge. Left eye exhibits no discharge. No scleral icterus.  Neck: Normal  range of motion. Neck supple. No JVD present. No tracheal deviation present. No thyromegaly present.  Cardiovascular: Normal rate, regular rhythm, normal heart sounds and intact distal pulses.  Exam reveals no gallop and no friction rub.   No murmur heard. Pulmonary/Chest: Effort normal and breath sounds normal. No stridor. No respiratory distress. She has no wheezes. She has no rales. She exhibits no tenderness.  Abdominal: Soft. Bowel sounds are normal. She exhibits no distension and no mass. There is no tenderness. There is no rebound and no guarding.  Genitourinary: Vagina normal and uterus normal. No vaginal discharge found.  Musculoskeletal: Normal range of motion. She exhibits no edema and no tenderness.  Lymphadenopathy:    She has no cervical adenopathy.  Neurological: She is alert and oriented to person, place, and time. She has normal  reflexes. She displays normal reflexes. No cranial nerve deficit. She exhibits normal muscle tone. Coordination normal.  Skin: Skin is warm and dry. No rash noted. No erythema. No pallor.  Psychiatric: She has a normal mood and affect. Her behavior is normal. Judgment and thought content normal.   her oncologist recently performed a breast exam in January.        Assessment & Plan:  1. Routine general medical examination at a health care facility Cancer screening and immunizations are up to date. Patient's bone density was done in 2013 and was stable to improved.  Therefore I will obtain some baseline screening labs.  Exam today was normal she can follow up in one year if there are no abnormalities on her blood work - Basic Metabolic Panel - CBC with Differential - Hepatic Function Panel - Lipid Panel - ALPRAZolam (XANAX) 0.5 MG tablet; Take 1 tablet (0.5 mg total) by mouth at bedtime as needed.  Dispense: 60 tablet; Refill: 1 - Pap IG (Image Guided) [facility]  2. Grief reaction Xanax was prescribed to be used on a when necessary basis. If the symptoms of depression and anxiety persist or become pathologic we can consider starting an SSRI. - ALPRAZolam (XANAX) 0.5 MG tablet; Take 1 tablet (0.5 mg total) by mouth at bedtime as needed.  Dispense: 60 tablet; Refill: 1

## 2012-09-17 LAB — PAP IG (IMAGE GUIDED)

## 2012-09-18 DIAGNOSIS — G459 Transient cerebral ischemic attack, unspecified: Secondary | ICD-10-CM

## 2012-09-18 DIAGNOSIS — I639 Cerebral infarction, unspecified: Secondary | ICD-10-CM

## 2012-09-18 HISTORY — DX: Transient cerebral ischemic attack, unspecified: G45.9

## 2012-09-18 HISTORY — DX: Cerebral infarction, unspecified: I63.9

## 2012-09-18 NOTE — Progress Notes (Signed)
Letter sent.

## 2012-09-26 ENCOUNTER — Encounter (HOSPITAL_COMMUNITY): Payer: PRIVATE HEALTH INSURANCE | Attending: Oncology

## 2012-09-26 VITALS — BP 130/73 | HR 80 | Temp 97.5°F | Resp 16

## 2012-09-26 DIAGNOSIS — M810 Age-related osteoporosis without current pathological fracture: Secondary | ICD-10-CM

## 2012-09-26 MED ORDER — ZOLEDRONIC ACID 5 MG/100ML IV SOLN
5.0000 mg | Freq: Once | INTRAVENOUS | Status: AC
  Administered 2012-09-26: 5 mg via INTRAVENOUS

## 2012-09-26 MED ORDER — SODIUM CHLORIDE 0.9 % IV SOLN
INTRAVENOUS | Status: DC
  Administered 2012-09-26: 250 mL via INTRAVENOUS

## 2012-09-26 MED ORDER — ZOLEDRONIC ACID 5 MG/100ML IV SOLN
INTRAVENOUS | Status: AC
  Filled 2012-09-26: qty 100

## 2012-09-26 NOTE — Progress Notes (Signed)
Caroline Valdez Teas tolerated infusion well and without incident; verbalizes understanding for follow-up.  No distress noted at time of discharge and patient was discharged home by herself.

## 2012-10-10 ENCOUNTER — Encounter: Payer: Self-pay | Admitting: Physician Assistant

## 2012-10-10 ENCOUNTER — Ambulatory Visit (INDEPENDENT_AMBULATORY_CARE_PROVIDER_SITE_OTHER): Admitting: Physician Assistant

## 2012-10-10 ENCOUNTER — Ambulatory Visit
Admission: RE | Admit: 2012-10-10 | Discharge: 2012-10-10 | Disposition: A | Discharge status: Home / Self Care | Source: Ambulatory Visit | Attending: Physician Assistant | Admitting: Physician Assistant

## 2012-10-10 VITALS — BP 110/70 | HR 86 | Temp 97.1°F | Resp 22 | Ht 65.0 in | Wt 129.0 lb

## 2012-10-10 DIAGNOSIS — E7800 Pure hypercholesterolemia, unspecified: Secondary | ICD-10-CM

## 2012-10-10 DIAGNOSIS — G459 Transient cerebral ischemic attack, unspecified: Secondary | ICD-10-CM

## 2012-10-10 DIAGNOSIS — D680 Von Willebrand's disease: Secondary | ICD-10-CM

## 2012-10-10 MED ORDER — ATORVASTATIN CALCIUM 80 MG PO TABS: 80.0000 mg | ORAL_TABLET | Freq: Every day | ORAL | Status: DC

## 2012-10-10 MED ORDER — IOHEXOL 300 MG/ML  SOLN
75.0000 mL | Freq: Once | INTRAMUSCULAR | Status: AC | PRN
  Administered 2012-10-10: 75 mL via INTRAVENOUS

## 2012-10-10 NOTE — Progress Notes (Signed)
Patient ID: DUSTINA SCOGGIN MRN: [id number], DOB: 05/27/1950, 62 y.o. Date of Encounter: @DATE @  Chief Complaint:  Chief Complaint  Patient presents with  . other    confusion yesterday    HPI: 62 y.o. year old female  presents with her daughter. She reports that yesterday she had periods of time that "she acted different than usual" and " she can only remember bits and pieces of things that went on during those times." Yesterday she went to lunch with multiple family members. They noticed that when they asked her questions, talked to her, she only gave them "one word answers, which is very unusual for her."  Later yesterday she had been at home, got in her car, picked up her mom, went to grocery store and out to eat. Then she drove back home. While out, her mom noticed that she "was acting strange". Patient realized later that she had bought two of the same items at the grocery store and she didn't mean to buy two. Now, patient can only remember parts of what went on at the lunch and only parts of what went on later that day. Really doesn't remember getting to her mom's house, etc.  She has noticed no other symptoms. No weakness/heaviness sensation in any arm or leg. No slurred speech. No staggering or balance issues. No vision changes.    Past Medical History  Diagnosis Date  . Fibromyalgia   . 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 Endometriosis   . Radiculopathy   . GERD (gastroesophageal reflux disease)   . Von Willebrand's disease     history of  . Hypercholesteremia   . Breast cancer      Home Meds: See attached medication section for current medication list. Any medications entered into computer today will not appear on this note's list. The medications listed below were entered prior to today. Current Outpatient Prescriptions on File Prior to Visit  Medication Sig Dispense Refill  . ALPRAZolam (XANAX) 0.5 MG tablet Take 1 tablet (0.5 mg total) by mouth at bedtime  as needed.  60 tablet  1  . calcium carbonate (OS-CAL) 600 MG TABS Take 600 mg by mouth daily.       . cholecalciferol (VITAMIN D) 1000 UNITS tablet Take 1,000 Units by mouth daily.      . DiphenhydrAMINE HCl, Sleep, (ZZZQUIL) 25 MG CAPS Take 25 mg by mouth at bedtime as needed. Takes 2 at bedtime as needed.      Marland Kitchen HYDROcodone-acetaminophen (VICODIN) 5-500 MG per tablet Take 1 tablet by mouth every 6 (six) hours as needed.        . Multiple Vitamins-Minerals (ICAPS MV PO) Take by mouth.      Marland Kitchen NEXIUM 40 MG capsule TAKE 1 CAPSULE BY MOUTH EVERY DAY  90 capsule  3  . OVER THE COUNTER MEDICATION Multivitamin      . zinc sulfate 220 MG capsule Take 220 mg by mouth daily.        . zoledronic acid (RECLAST) 5 MG/100ML SOLN Inject 5 mg into the vein once. yearly       No current facility-administered medications on file prior to visit.    Allergies: No Known Allergies  History   Social History  . Marital Status: Married    Spouse Name: N/A    Number of Children: N/A  . Years of Education: N/A   Occupational History  . Not on file.   Social History Main Topics  .  Smoking status: Never Smoker   . Smokeless tobacco: Never Used  . Alcohol Use: No  . Drug Use: No  . Sexually Active: Not on file     Comment: husband recently died from myelodysplastic syndrome in January of 2014   Other Topics Concern  . Not on file   Social History Narrative  . No narrative on file    Family History  Problem Relation Age of Onset  . Cancer Mother     breast cancer  . Stroke Father   . Cancer Sister     breast cancer     Review of Systems:  See HPI for pertinent ROS. All other ROS negative.    Physical Exam: Blood pressure 110/70, pulse 86, temperature 97.1 F (36.2 C), temperature source Oral, resp. rate 22, height 5\' 5"  (1.651 m), weight 129 lb (58.514 kg)., Body mass index is 21.47 kg/(m^2). General: WNWD WF. Appears in no acute distress. Neck: Supple. No thyromegaly. No  lymphadenopathy. Carotid Arteries 2+ Bilaterally without Bruits.  Lungs: Clear bilaterally to auscultation without wheezes, rales, or rhonchi. Breathing is unlabored. Heart: RRR with S1 S2. No murmurs, rubs, or gallops. Musculoskeletal:  Strength and tone normal for age. Neuro: Alert and oriented X 3. Moves all extremities spontaneously.EOM intact.  Gait is normal.Romberg normal. Can tandem walk heel to toe with good balance.  CNII-XII grossly in tact.  5/5 strength of bilateral arms and bilateral legs and at ankle level.   Psych:  Responds to questions appropriately with a normal affect.     ASSESSMENT AND PLAN:  62 y.o. year old female with  1. TIA (transient ischemic attack) I reviewed case with Dr. Tanya Nones while patient in office. He also discussed situation with patient and her daughter. Discussed that we think she has had a TIA. Discussed risk of Stroke in next two weeks following a TIA. Explained the tests that need to be done. Explained that we need to do CT scan immediately. We can either schedule the Carotid Doppler and Echo to be done on outpt basis or we can have her go to the hospital and have all of this evaluation complete in next 24 hours. She is aware of risk but opts to have eval on outpatient basis.  We also discussed her h/o VonWillibrands. Discussed bleeding risk from Von Willebrands vs Clot  Risk given recent TIA.  Will obtain Head CT now. If that shows no bleed, then she will start Aspirin 81mg  one QD.  We will change her Simvastatin to Lipitor 80 given the data of use of  Lipitor 80 at time of TIA/CVA. Will need FLP/LFT in 6 weeks.  She will schedule f/u OV here in 2 weeks to f/u the above. F/U sooner if any further signs/symptoms.  Her BP is good at 110/70.   - CT Head W Wo Contrast; Future - 2D Echocardiogram without contrast; Future - US Carotid Duplex Bilateral; Future - atorvastatin (LIPITOR) 80 MG tablet; Take 1 tablet (80 mg total) by mouth daily.  Dispense: 30  tablet; Refill: 1  2. Von Willebrand disease See above.  3. Hypercholesteremia - atorvastatin (LIPITOR) 80 MG tablet; Take 1 tablet (80 mg total) by mouth daily.  Dispense: 30 tablet; Refill: 1   Signed, [address] [place], [place], [facility] 10/10/2012 4:20 PM

## 2012-10-22 ENCOUNTER — Encounter (HOSPITAL_COMMUNITY)

## 2012-10-22 ENCOUNTER — Ambulatory Visit (HOSPITAL_COMMUNITY)
Admission: RE | Admit: 2012-10-22 | Discharge: 2012-10-22 | Disposition: A | Discharge status: Home / Self Care | Source: Ambulatory Visit | Attending: Cardiovascular Disease | Admitting: Cardiovascular Disease

## 2012-10-22 DIAGNOSIS — E785 Hyperlipidemia, unspecified: Secondary | ICD-10-CM | POA: Insufficient documentation

## 2012-10-22 DIAGNOSIS — I059 Rheumatic mitral valve disease, unspecified: Secondary | ICD-10-CM | POA: Insufficient documentation

## 2012-10-22 DIAGNOSIS — C50919 Malignant neoplasm of unspecified site of unspecified female breast: Secondary | ICD-10-CM | POA: Insufficient documentation

## 2012-10-22 DIAGNOSIS — I079 Rheumatic tricuspid valve disease, unspecified: Secondary | ICD-10-CM | POA: Insufficient documentation

## 2012-10-22 DIAGNOSIS — G459 Transient cerebral ischemic attack, unspecified: Secondary | ICD-10-CM | POA: Insufficient documentation

## 2012-10-22 DIAGNOSIS — I359 Nonrheumatic aortic valve disorder, unspecified: Secondary | ICD-10-CM | POA: Insufficient documentation

## 2012-10-22 NOTE — Progress Notes (Signed)
[facility]   2D echo completed 10/22/2012.   Cindy Jacquelynn Friend, RDCS  

## 2012-10-22 NOTE — Progress Notes (Signed)
Carotid Duplex Completed. Caroline Valdez, RDMS, RVT  

## 2012-10-24 ENCOUNTER — Ambulatory Visit (INDEPENDENT_AMBULATORY_CARE_PROVIDER_SITE_OTHER): Admitting: Family Medicine

## 2012-10-24 ENCOUNTER — Encounter: Payer: Self-pay | Admitting: Family Medicine

## 2012-10-24 VITALS — BP 122/76 | HR 80 | Temp 97.6°F | Resp 18 | Wt 128.0 lb

## 2012-10-24 DIAGNOSIS — G459 Transient cerebral ischemic attack, unspecified: Secondary | ICD-10-CM

## 2012-10-24 NOTE — Progress Notes (Signed)
Subjective:    Patient ID: Caroline Valdez, female    DOB: 16-Oct-1950, 62 y.o.   MRN: [id number]  HPI Patient's last office visit was reviewed. She was diagnosed with a possible TIA. The patient has 3 hours of time which he cannot remember. The family states that she was giving short answers was acting out of character was acting confused. There was no bowel or bladder incontinence. There is no witnessed seizure activity. There is no history of a concussion. She did not take any mood altering substances.  CAT scan obtained revealed no acute intracranial and amount he. Carotid Doppler showed mild nonobstructive plaque. Echocardiogram of the heart was completely normal. The patient was started on aspirin 81 mg by mouth daily and was switched from Zocor 20 mg a day 2 Lipitor 80 mg a day. Since being seen the patient reports feeling tired and some slight myalgias on high-dose Lipitor. Otherwise she is doing well. They have been no further episodes. Past Medical History  Diagnosis Date  . Fibromyalgia   . 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 Endometriosis   . Radiculopathy   . GERD (gastroesophageal reflux disease)   . Von Willebrand's disease     history of  . Hypercholesteremia   . Breast cancer    Current Outpatient Prescriptions on File Prior to Visit  Medication Sig Dispense Refill  . ALPRAZolam (XANAX) 0.5 MG tablet Take 1 tablet (0.5 mg total) by mouth at bedtime as needed.  60 tablet  1  . atorvastatin (LIPITOR) 80 MG tablet Take 1 tablet (80 mg total) by mouth daily.  30 tablet  1  . calcium carbonate (OS-CAL) 600 MG TABS Take 600 mg by mouth daily.       . cholecalciferol (VITAMIN D) 1000 UNITS tablet Take 1,000 Units by mouth daily.      . DiphenhydrAMINE HCl, Sleep, (ZZZQUIL) 25 MG CAPS Take 25 mg by mouth at bedtime as needed. Takes 2 at bedtime as needed.      Marland Kitchen HYDROcodone-acetaminophen (VICODIN) 5-500 MG per tablet Take 1 tablet by mouth every 6 (six) hours as  needed.        . zinc sulfate 220 MG capsule Take 220 mg by mouth daily.        . zoledronic acid (RECLAST) 5 MG/100ML SOLN Inject 5 mg into the vein once. yearly      . Multiple Vitamins-Minerals (ICAPS MV PO) Take by mouth.      Marland Kitchen NEXIUM 40 MG capsule TAKE 1 CAPSULE BY MOUTH EVERY DAY  90 capsule  3   No current facility-administered medications on file prior to visit.   No Known Allergies History   Social History  . Marital Status: Married    Spouse Name: N/A    Number of Children: N/A  . Years of Education: N/A   Occupational History  . Not on file.   Social History Main Topics  . Smoking status: Never Smoker   . Smokeless tobacco: Never Used  . Alcohol Use: No  . Drug Use: No  . Sexually Active: Not on file     Comment: husband recently died from myelodysplastic syndrome in January of 2014   Other Topics Concern  . Not on file   Social History Narrative  . No narrative on file      Review of Systems  All other systems reviewed and are negative.       Objective:   Physical Exam  Vitals  reviewed. Constitutional: She is oriented to person, place, and time. She appears well-developed and well-nourished.  Eyes: Conjunctivae and EOM are normal. Pupils are equal, round, and reactive to light.  Neck: Neck supple. No JVD present. No thyromegaly present.  Cardiovascular: Normal rate, regular rhythm and normal heart sounds.   No murmur heard. Pulmonary/Chest: Effort normal and breath sounds normal. No respiratory distress. She has no wheezes. She has no rales.  Abdominal: Soft. Bowel sounds are normal. She exhibits no distension. There is no tenderness.  Musculoskeletal: Normal range of motion.  Lymphadenopathy:    She has no cervical adenopathy.  Neurological: She is alert and oriented to person, place, and time. She has normal reflexes. She displays normal reflexes. No cranial nerve deficit. Coordination normal.          Assessment & Plan:  1. TIA  (transient ischemic attack) Emesis was likely a TIA. Therefore I've asked her to continue the aspirin 81 mg by mouth daily. She can decrease the Lipitor to 40 mg by mouth daily hopefully that'll make it more tolerable. I like her to return in 3 months let us recheck a fasting lipid panel. Her goal LDL be less than 70. She is to return immediately if symptoms change or return. The other possibility is this could have been a unusual partial seizure with some type of post ictal phase.  Consider an EEG if these attacks become recurrent.

## 2012-10-27 ENCOUNTER — Telehealth: Payer: Self-pay | Admitting: Family Medicine

## 2012-10-27 NOTE — Telephone Encounter (Signed)
Message copied by Donne Anon on Mon Oct 27, 2012  5:00 PM ------      Message from: Allayne Butcher      Created: Sat Oct 25, 2012 10:35 AM       She recently had OV and was diagnosed with TIA      Carotid Artery Doppler shows no significant blockages in the carotid arteries--the arteries in her neck going up to her brain.       Tell her tests are normal. Make sure she is taking ASA 81mg  QD and the Lipitor as we discussed. Schedule f/u OV in 5 weeks--come fasting.       ------

## 2012-10-27 NOTE — Telephone Encounter (Signed)
Pt saw Dr Tanya Nones on Friday.  He relayed results to her.  Has cut Lipitor dose in half and is having her return in 3 months

## 2012-12-29 ENCOUNTER — Other Ambulatory Visit: Payer: Self-pay | Admitting: Family Medicine

## 2012-12-29 NOTE — Telephone Encounter (Signed)
Medication refilled per protocol. 

## 2013-01-05 ENCOUNTER — Other Ambulatory Visit: Payer: Self-pay | Admitting: Physician Assistant

## 2013-01-05 NOTE — Telephone Encounter (Signed)
Medication refilled per protocol. 

## 2013-01-26 ENCOUNTER — Ambulatory Visit (INDEPENDENT_AMBULATORY_CARE_PROVIDER_SITE_OTHER): Admitting: Family Medicine

## 2013-01-26 ENCOUNTER — Encounter: Payer: Self-pay | Admitting: Family Medicine

## 2013-01-26 VITALS — BP 100/64 | HR 78 | Temp 97.7°F | Resp 14 | Wt 129.0 lb

## 2013-01-26 DIAGNOSIS — G459 Transient cerebral ischemic attack, unspecified: Secondary | ICD-10-CM

## 2013-01-26 DIAGNOSIS — Z23 Encounter for immunization: Secondary | ICD-10-CM

## 2013-01-26 LAB — COMPLETE METABOLIC PANEL WITH GFR
Albumin: 4.3 g/dL (ref 3.5–5.2)
Alkaline Phosphatase: 48 U/L (ref 39–117)
BUN: 11 mg/dL (ref 6–23)
GFR, Est Non African American: 89 mL/min
Glucose, Bld: 85 mg/dL (ref 70–99)
Potassium: 4.1 mEq/L (ref 3.5–5.3)
Total Bilirubin: 1 mg/dL (ref 0.3–1.2)

## 2013-01-26 LAB — LIPID PANEL
Cholesterol: 117 mg/dL (ref 0–200)
Total CHOL/HDL Ratio: 2.3 Ratio

## 2013-01-26 MED ORDER — ATORVASTATIN CALCIUM 80 MG PO TABS: ORAL_TABLET | ORAL | Status: DC

## 2013-01-26 NOTE — Progress Notes (Signed)
Subjective:    Patient ID: Caroline Valdez, female    DOB: 1951/02/17, 62 y.o.   MRN: [id number]  Hyperlipidemia  10/24/12 Patient's last office visit was reviewed. She was diagnosed with a possible TIA. The patient has 3 hours of time which he cannot remember. The family states that she was giving short answers was acting out of character was acting confused. There was no bowel or bladder incontinence. There is no witnessed seizure activity. There is no history of a concussion. She did not take any mood altering substances.  CAT scan obtained revealed no acute intracranial and amount he. Carotid Doppler showed mild nonobstructive plaque. Echocardiogram of the heart was completely normal. The patient was started on aspirin 81 mg by mouth daily and was switched from Zocor 20 mg a day to Lipitor 80 mg a day. Since being seen the patient reports feeling tired and some slight myalgias on high-dose Lipitor. Otherwise she is doing well. They have been no further episodes.  At that time, my plan was: 1. TIA (transient ischemic attack) Emesis was likely a TIA. Therefore I've asked her to continue the aspirin 81 mg by mouth daily. She can decrease the Lipitor to 40 mg by mouth daily hopefully that'll make it more tolerable. I like her to return in 3 months let us recheck a fasting lipid panel. Her goal LDL be less than 70. She is to return immediately if symptoms change or return. The other possibility is this could have been a unusual partial seizure with some type of post ictal phase.  Consider an EEG if these attacks become recurrent.  She is here today for followup. She has had no further episodes similar to the TIA she had in the spring. She is doing well. She denies any myalgias on Lipitor. She is currently taking 40 mg by mouth daily. She is here to recheck a fasting lipid panel. Her goal LDL is less than 70. Blood pressure is currently well controlled. She is taking her aspirin 81 mg every day for secondary  prevention of stroke. She denies any chest pain, shortness of breath, dyspnea on exertion, palpitations, or syncope. Past Medical History  Diagnosis Date  . Fibromyalgia   . 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 Endometriosis   . Radiculopathy   . GERD (gastroesophageal reflux disease)   . Von Willebrand's disease     history of  . Hypercholesteremia   . Breast cancer     left   Current Outpatient Prescriptions on File Prior to Visit  Medication Sig Dispense Refill  . ALPRAZolam (XANAX) 0.5 MG tablet Take 1 tablet (0.5 mg total) by mouth at bedtime as needed.  60 tablet  1  . aspirin 81 MG tablet Take 81 mg by mouth daily.      . calcium carbonate (OS-CAL) 600 MG TABS Take 600 mg by mouth daily.       . cholecalciferol (VITAMIN D) 1000 UNITS tablet Take 1,000 Units by mouth daily.      . DiphenhydrAMINE HCl, Sleep, (ZZZQUIL) 25 MG CAPS Take 25 mg by mouth at bedtime as needed. Takes 2 at bedtime as needed.      . Multiple Vitamins-Minerals (ICAPS MV PO) Take by mouth.      Marland Kitchen NEXIUM 40 MG capsule TAKE 1 CAPSULE DAILY  90 capsule  3  . zinc sulfate 220 MG capsule Take 220 mg by mouth daily.        . zoledronic acid (  RECLAST) 5 MG/100ML SOLN Inject 5 mg into the vein once. yearly       No current facility-administered medications on file prior to visit.   No Known Allergies History   Social History  . Marital Status: Married    Spouse Name: N/A    Number of Children: N/A  . Years of Education: N/A   Occupational History  . Not on file.   Social History Main Topics  . Smoking status: Never Smoker   . Smokeless tobacco: Never Used  . Alcohol Use: No  . Drug Use: No  . Sexual Activity: Not on file     Comment: husband recently died from myelodysplastic syndrome in January of 2014   Other Topics Concern  . Not on file   Social History Narrative  . No narrative on file      Review of Systems  All other systems reviewed and are negative.        Objective:   Physical Exam  Vitals reviewed. Constitutional: She is oriented to person, place, and time. She appears well-developed and well-nourished.  Eyes: Conjunctivae and EOM are normal. Pupils are equal, round, and reactive to light.  Neck: Neck supple. No JVD present. No thyromegaly present.  Cardiovascular: Normal rate, regular rhythm and normal heart sounds.   No murmur heard. Pulmonary/Chest: Effort normal and breath sounds normal. No respiratory distress. She has no wheezes. She has no rales.  Abdominal: Soft. Bowel sounds are normal. She exhibits no distension. There is no tenderness.  Musculoskeletal: Normal range of motion.  Lymphadenopathy:    She has no cervical adenopathy.  Neurological: She is alert and oriented to person, place, and time. She has normal reflexes. No cranial nerve deficit. Coordination normal.          Assessment & Plan:  1. Need for prophylactic vaccination and inoculation against influenza - Flu Vaccine QUAD 36+ mos IM  2. TIA (transient ischemic attack) Check fasting lipid panel today. Goal LDL is less than 70. If she is at goal I would continue Lipitor 40 mg by mouth daily. If She's not at goal, increase Lipitor to 80 mg by mouth daily. I recommended she continue aspirin 81 mg by mouth daily. - COMPLETE METABOLIC PANEL WITH GFR - Lipid panel - atorvastatin (LIPITOR) 80 MG tablet; TAKE ONE TABLET BY MOUTH EVERY DAY  Dispense: 90 tablet; Refill: 3

## 2013-06-15 ENCOUNTER — Other Ambulatory Visit: Payer: Self-pay | Admitting: Family Medicine

## 2013-06-15 DIAGNOSIS — Z139 Encounter for screening, unspecified: Secondary | ICD-10-CM

## 2013-06-30 ENCOUNTER — Encounter (HOSPITAL_BASED_OUTPATIENT_CLINIC_OR_DEPARTMENT_OTHER)

## 2013-06-30 ENCOUNTER — Encounter (HOSPITAL_COMMUNITY): Payer: Self-pay

## 2013-06-30 ENCOUNTER — Encounter (HOSPITAL_COMMUNITY): Attending: Hematology and Oncology

## 2013-06-30 VITALS — BP 108/72 | HR 80 | Temp 97.9°F | Resp 16 | Wt 132.0 lb

## 2013-06-30 DIAGNOSIS — D0510 Intraductal carcinoma in situ of unspecified breast: Secondary | ICD-10-CM

## 2013-06-30 DIAGNOSIS — Z09 Encounter for follow-up examination after completed treatment for conditions other than malignant neoplasm: Secondary | ICD-10-CM | POA: Insufficient documentation

## 2013-06-30 DIAGNOSIS — Z170 Estrogen receptor positive status [ER+]: Secondary | ICD-10-CM | POA: Insufficient documentation

## 2013-06-30 DIAGNOSIS — E7800 Pure hypercholesterolemia, unspecified: Secondary | ICD-10-CM | POA: Insufficient documentation

## 2013-06-30 DIAGNOSIS — D6800 Von Willebrand disease, unspecified: Secondary | ICD-10-CM

## 2013-06-30 DIAGNOSIS — D0590 Unspecified type of carcinoma in situ of unspecified breast: Secondary | ICD-10-CM

## 2013-06-30 DIAGNOSIS — IMO0002 Reserved for concepts with insufficient information to code with codable children: Secondary | ICD-10-CM | POA: Insufficient documentation

## 2013-06-30 DIAGNOSIS — Z9010 Acquired absence of unspecified breast and nipple: Secondary | ICD-10-CM | POA: Insufficient documentation

## 2013-06-30 DIAGNOSIS — Z853 Personal history of malignant neoplasm of breast: Secondary | ICD-10-CM | POA: Insufficient documentation

## 2013-06-30 DIAGNOSIS — K219 Gastro-esophageal reflux disease without esophagitis: Secondary | ICD-10-CM | POA: Insufficient documentation

## 2013-06-30 DIAGNOSIS — IMO0001 Reserved for inherently not codable concepts without codable children: Secondary | ICD-10-CM | POA: Insufficient documentation

## 2013-06-30 DIAGNOSIS — Z8673 Personal history of transient ischemic attack (TIA), and cerebral infarction without residual deficits: Secondary | ICD-10-CM | POA: Insufficient documentation

## 2013-06-30 DIAGNOSIS — M948X9 Other specified disorders of cartilage, unspecified sites: Secondary | ICD-10-CM | POA: Insufficient documentation

## 2013-06-30 LAB — CBC WITH DIFFERENTIAL/PLATELET
BASOS ABS: 0 10*3/uL (ref 0.0–0.1)
BASOS PCT: 0 % (ref 0–1)
Eosinophils Absolute: 0.1 10*3/uL (ref 0.0–0.7)
Eosinophils Relative: 1 % (ref 0–5)
HEMATOCRIT: 41.4 % (ref 36.0–46.0)
HEMOGLOBIN: 14 g/dL (ref 12.0–15.0)
LYMPHS PCT: 31 % (ref 12–46)
Lymphs Abs: 1.6 10*3/uL (ref 0.7–4.0)
MCH: 31.7 pg (ref 26.0–34.0)
MCHC: 33.8 g/dL (ref 30.0–36.0)
MCV: 93.7 fL (ref 78.0–100.0)
Monocytes Absolute: 0.5 10*3/uL (ref 0.1–1.0)
Monocytes Relative: 10 % (ref 3–12)
NEUTROS ABS: 3.1 10*3/uL (ref 1.7–7.7)
NEUTROS PCT: 58 % (ref 43–77)
Platelets: 190 10*3/uL (ref 150–400)
RBC: 4.42 MIL/uL (ref 3.87–5.11)
RDW: 12.4 % (ref 11.5–15.5)
WBC: 5.3 10*3/uL (ref 4.0–10.5)

## 2013-06-30 LAB — APTT: aPTT: 29 seconds (ref 24–37)

## 2013-06-30 LAB — COMPREHENSIVE METABOLIC PANEL
ALBUMIN: 4.3 g/dL (ref 3.5–5.2)
ALK PHOS: 64 U/L (ref 39–117)
ALT: 23 U/L (ref 0–35)
AST: 28 U/L (ref 0–37)
BILIRUBIN TOTAL: 0.8 mg/dL (ref 0.3–1.2)
BUN: 12 mg/dL (ref 6–23)
CHLORIDE: 101 meq/L (ref 96–112)
CO2: 30 meq/L (ref 19–32)
Calcium: 9.9 mg/dL (ref 8.4–10.5)
Creatinine, Ser: 0.75 mg/dL (ref 0.50–1.10)
GFR calc Af Amer: >90 mL/min (ref >90)
GFR, EST NON AFRICAN AMERICAN: 88 mL/min — AB (ref >90)
Glucose, Bld: 96 mg/dL (ref 70–99)
POTASSIUM: 4 meq/L (ref 3.7–5.3)
Sodium: 142 mEq/L (ref 137–147)
Total Protein: 7.6 g/dL (ref 6.0–8.3)

## 2013-06-30 LAB — PROTIME-INR
INR: 0.97 (ref 0.00–1.49)
Prothrombin Time: 12.7 seconds (ref 11.6–15.2)

## 2013-06-30 NOTE — Patient Instructions (Addendum)
[place] Discharge Instructions  RECOMMENDATIONS MADE BY THE CONSULTANT AND ANY TEST RESULTS WILL BE SENT TO YOUR REFERRING PHYSICIAN.  EXAM FINDINGS BY THE PHYSICIAN TODAY AND SIGNS OR SYMPTOMS TO REPORT TO CLINIC OR PRIMARY PHYSICIAN: Exam and findings as discussed by Dr. Barnet Glasgow.  We will get you scheduled for genetic counseling for determination of BRCA1 and BRCA2 testing. They will contact you with the date and time of your appointment.  Will check some additional blood work today, will cancel your reclast infusion and see you back in 1 year. Report any new lumps, bone pain, shortness of breath or other symptoms.     INSTRUCTIONS/FOLLOW-UP: Follow-up in 1 year.   Thank you for choosing [facility] to provide your oncology and hematology care.  To afford each patient quality time with our providers, please arrive at least 15 minutes before your scheduled appointment time.  With your help, our goal is to use those 15 minutes to complete the necessary work-up to ensure our physicians have the information they need to help with your evaluation and healthcare recommendations.    Effective January 1st, 2014, we ask that you re-schedule your appointment with our physicians should you arrive 10 or more minutes late for your appointment.  We strive to give you quality time with our providers, and arriving late affects you and other patients whose appointments are after yours.    Again, thank you for choosing [facility].  Our hope is that these requests will decrease the amount of time that you wait before being seen by our physicians.       _____________________________________________________________  Should you have questions after your visit to [facility], please contact our office at [phone] between the hours of 8:30 a.m. and 5:00 p.m.  Voicemails left after 4:30 p.m. will not be returned until the following business day.   For prescription refill requests, have your pharmacy contact our office with your prescription refill request.

## 2013-06-30 NOTE — Progress Notes (Signed)
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 - Plan: CBC with Differential, Comprehensive metabolic panel, APTT, Protime-INR, Von Willebrand multimeric, simvastatin (ZOCOR) 40 MG tablet, Ambulatory referral to Genetics, CBC with Differential, Comprehensive metabolic panel, APTT, Protime-INR, Von Willebrand multimeric  Von Willebrand disease - Plan: CBC with Differential, Comprehensive metabolic panel, APTT, Protime-INR, Von Willebrand multimeric, simvastatin (ZOCOR) 40 MG tablet, Ambulatory referral to Genetics, CBC with Differential, Comprehensive metabolic panel, APTT, Protime-INR, Von Willebrand multimeric  Chief Complaint  Patient presents with  . Follow-up  . DCIS with microinvasion  . Von Willebrand's disease    On DDAVP    CURRENT THERAPY: Watchful expectation, intermittent use of DDAVP prior to surgical interventions for von Willebrand's disease  INTERVAL HISTORY: Caroline Valdez 63 y.o. 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 Apparently suffered a "mini stroke" in May of 2014 but with normal echocardiogram and normal ultrasound of the carotids. She had experienced a 2-3 hour period when she did not remember what happened. She was driving her mother to appointments at the time and did not suffer any left or right-sided weakness. She is not any additional experiences. She does have an abnormality in the right breast which is being followed by MRI. She denies any upper extremity swelling or redness, cough, wheezing, PND, orthopnea, palpitations, epistaxis, easy bruising, melena, hematochezia,  hematuria, lower extremity swelling or redness, PND, orthopnea, palpitations, skin rash, headache, or seizures. Because of hyperostosis in the floor of her mouth, she plans to undergo oral surgery in May of 2015. She is interested in undergoing BRCA1 and BRCA2 testing.  MEDICAL HISTORY: Past Medical History  Diagnosis Date  . Fibromyalgia   . 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 Endometriosis   . Radiculopathy   . GERD (gastroesophageal reflux disease)   . Von Willebrand's disease     history of  . Hypercholesteremia   . Breast cancer     left  . Mini stroke 09/2012    INTERIM HISTORY: has Radiculopathy; Fibromyalgia; Endometriosis; Hypercholesteremia; and Breast cancer on her problem list.   #1.DCIS of the left breast with suspicious but not definite cells for possible microinvasion. The DCIS was ER positive, PR negative but felt to be high-grade status post mastectomy on 04/30/1999 and with subsequent bilateral reconstruction with silicone implants on both sides. The DCIS was 1.2 cm in size and at the time of mastectomy there was another 8 mm of DCIS found. The original biopsy took place in late October 2010. She did not want to pursue adjuvant hormonal therapy.  #2Cruzita Valdez Willebrand's disease diagnosed by Dr. Sterling Valdez at [facility] and treated with DDAVP prior to surgery in the past.  ALLERGIES:  has No Known Allergies.  MEDICATIONS: has a current medication list which includes the following prescription(s): alprazolam, aspirin, calcium carbonate, cholecalciferol, diphenhydramine hcl (sleep), multiple vitamins-minerals, nexium, simvastatin, zinc sulfate, and zoledronic acid.  SURGICAL HISTORY:  Past Surgical History  Procedure Laterality Date  . Mastectomy      left  . Hemorrhoid surgery    . Abdominal hysterectomy    . Tonsillectomy    . Shoulder surgery    .  Back surgery    . Laminectomy    . Carpal tunnel release      right  . Breast reconstruction       FAMILY HISTORY: family history includes Cancer in her mother and sister; Stroke in her father.  SOCIAL HISTORY:  reports that she has never smoked. She has never used smokeless tobacco. She reports that she does not drink alcohol or use illicit drugs.  REVIEW OF SYSTEMS:  Other than that discussed above is noncontributory.  PHYSICAL EXAMINATION: ECOG PERFORMANCE STATUS: 0 - Asymptomatic  Blood pressure 108/72, pulse 80, temperature 97.9 F (36.6 C), temperature source Oral, resp. rate 16, weight 132 lb (59.875 kg).  GENERAL:alert, no distress and comfortable SKIN: skin color, texture, turgor are normal, no rashes or significant lesions EYES: PERLA; Conjunctiva are pink and non-injected, sclera clear OROPHARYNX: Hyperostosis noticed in the floor of the mouth anteriorly. NECK: supple, thyroid normal size, non-tender, without nodularity. No masses CHEST: Status post right breast reduction with implant. Left breast reconstructed after left mastectomy. No palpable masses in either breast. No LYMPH:  no palpable lymphadenopathy in the cervical, axillary or inguinal LUNGS: clear to auscultation and percussion with normal breathing effort HEART: regular rate & rhythm and no murmurs. ABDOMEN:abdomen soft, non-tender and normal bowel sounds MUSCULOSKELETAL:no cyanosis of digits and no clubbing. Range of motion normal.  NEURO: alert & oriented x 3 with fluent speech, no focal motor/sensory deficits   LABORATORY DATA: Office Visit on 06/30/2013  Component Date Value Range Status  . WBC 06/30/2013 5.3  4.0 - 10.5 K/uL Final  . RBC 06/30/2013 4.42  3.87 - 5.11 MIL/uL Final  . Hemoglobin 06/30/2013 14.0  12.0 - 15.0 g/dL Final  . HCT 06/30/2013 41.4  36.0 - 46.0 % Final  . MCV 06/30/2013 93.7  78.0 - 100.0 fL Final  . MCH 06/30/2013 31.7  26.0 - 34.0 pg Final  . MCHC 06/30/2013 33.8  30.0 - 36.0 g/dL Final  . RDW 06/30/2013 12.4  11.5 - 15.5 % Final  . Platelets 06/30/2013 190  150 - 400  K/uL Final  . Neutrophils Relative % 06/30/2013 58  43 - 77 % Final  . Neutro Abs 06/30/2013 3.1  1.7 - 7.7 K/uL Final  . Lymphocytes Relative 06/30/2013 31  12 - 46 % Final  . Lymphs Abs 06/30/2013 1.6  0.7 - 4.0 K/uL Final  . Monocytes Relative 06/30/2013 10  3 - 12 % Final  . Monocytes Absolute 06/30/2013 0.5  0.1 - 1.0 K/uL Final  . Eosinophils Relative 06/30/2013 1  0 - 5 % Final  . Eosinophils Absolute 06/30/2013 0.1  0.0 - 0.7 K/uL Final  . Basophils Relative 06/30/2013 0  0 - 1 % Final  . Basophils Absolute 06/30/2013 0.0  0.0 - 0.1 K/uL Final  . Sodium 06/30/2013 142  137 - 147 mEq/L Final  . Potassium 06/30/2013 4.0  3.7 - 5.3 mEq/L Final  . Chloride 06/30/2013 101  96 - 112 mEq/L Final  . CO2 06/30/2013 30  19 - 32 mEq/L Final  . Glucose, Bld 06/30/2013 96  70 - 99 mg/dL Final  . BUN 06/30/2013 12  6 - 23 mg/dL Final  . Creatinine, Ser 06/30/2013 0.75  0.50 - 1.10 mg/dL Final  . Calcium 06/30/2013 9.9  8.4 - 10.5 mg/dL Final  . Total Protein 06/30/2013 7.6  6.0 - 8.3 g/dL Final  . Albumin 06/30/2013 4.3  3.5 - 5.2 g/dL Final  . AST 06/30/2013 28  0 -  37 U/L Final  . ALT 06/30/2013 23  0 - 35 U/L Final  . Alkaline Phosphatase 06/30/2013 64  39 - 117 U/L Final  . Total Bilirubin 06/30/2013 0.8  0.3 - 1.2 mg/dL Final  . GFR calc non Af Amer 06/30/2013 88* >90 mL/min Final  . GFR calc Af Amer 06/30/2013 >90  >90 mL/min Final   Comment: (NOTE)                          The eGFR has been calculated using the CKD EPI equation.                          This calculation has not been validated in all clinical situations.                          eGFR's persistently <90 mL/min signify possible Chronic Kidney                          Disease.  Marland Kitchen aPTT 06/30/2013 29  24 - 37 seconds Final  . Prothrombin Time 06/30/2013 12.7  11.6 - 15.2 seconds Final  . INR 06/30/2013 0.97  0.00 - 1.49 Final    PATHOLOGY: No new pathology.  Urinalysis No results found for this basename:  colorurine,  appearanceur,  labspec,  phurine,  glucoseu,  hgbur,  bilirubinur,  ketonesur,  proteinur,  urobilinogen,  nitrite,  leukocytesur    RADIOGRAPHIC STUDIES: . MM Digital Diagnostic Unilat R Status: Final result            Study Result    *RADIOLOGY REPORT*  Clinical Data: Trauma to the right breast. Evaluate implant  integrity. The patient states that clinically the implant is stable  in appearance. She states that she had pain following the accident  but that has improved. History of left breast cancer status post  mastectomy.  DIGITAL DIAGNOSTIC RIGHT MAMMOGRAM WITH CAD  Comparison: With priors  Findings:  ACR Breast Density Category 3: The breast tissue is heterogeneously  dense.  Standard and push back views were obtained. There is a subpectoral  implant. Integrity of the implant looks intact mammographically.  No suspicious mass or malignant-type microcalcifications  identified.  Mammographic images were processed with CAD.  IMPRESSION:  No evidence of malignancy in the right breast.  RECOMMENDATION:  Unilateral right screening mammogram in 1 year is recommended. If  additional imaging of the right breast implant for integrity is  needed MRI would be recommended.  I have discussed the findings and recommendations with the patient.  Results were also provided in writing at the conclusion of the  visit.  BI-RADS CATEGORY 1: Negative.  Original Report Authenticated      ASSESSMENT:  #1. Noninvasive ductal neoplasia the left breast, microinvasion, status post left mastectomy and reconstruction together with right breast reduction and implant, no evidence of disease. Surgery was performed in December 2010. #2. Von Willebrand's disease, asymptomatic. #3. Probable abscence seizure in June of 2014, normal echocardiogram and normal carotid ultrasound. #4. Desirous of BRCA1 and BRCA2 testing. #5. Right shoulder surgery 2008. #6. Ruptured disc with laminectomy  in 1989. #7. Tonsillectomy 1968. #8. Hyperostosis floor of the mouth for oral surgery in May 2015. #9. History of DCIS in her mother and also in a sister at age 28 status post bilateral mastectomies in her sister. She tells  me today that one of her sister's granddaughters was diagnosed with ovarian cancer at age 62    PLAN:  #1. Genetic counseling for BRCA1 and BRCA2 testing. #2. Per recommendations of her oral surgeon, Reclast will be held prior to anticipated surgery in May of 2015. #3. Continue yearly MRI of the right breast. #4. Will minister Reclast after oral surgery is completed. #5. Followup in one year with CBC, chem profile, and vitamin D level.   All questions were answered. The patient knows to call the clinic with any problems, questions or concerns. We can certainly see the patient much sooner if necessary.   I spent 25 minutes counseling the patient face to face. The total time spent in the appointment was 30 minutes.    Doroteo Bradford, MD 06/30/2013 12:59 PM

## 2013-06-30 NOTE — Addendum Note (Signed)
Addended by: Mellissa Kohut on: 06/30/2013 02:32 PM   Modules accepted: Orders

## 2013-06-30 NOTE — Progress Notes (Signed)
Labs drawn today for von willebrand,cbc/diff,cmp,ptt,pt,cmp,ldh

## 2013-07-01 ENCOUNTER — Telehealth: Payer: Self-pay | Admitting: Genetic Counselor

## 2013-07-01 NOTE — Telephone Encounter (Signed)
PATIENT SCHEDULED FOR GENETIC COUSELING ON 04/30 @ 9 W/KAREN POWELL.  [facility] PACKET MAILED.

## 2013-07-01 NOTE — Telephone Encounter (Signed)
LEFT MESSAGE FOR PATIENT TO RETURN CALL TO SCHEDULE GENETIC APPT.  °

## 2013-07-06 LAB — VON WILLEBRAND FACTOR MULTIMER
FACTOR-VIII ACTIVITY: 117 % (ref 50–180)
RISTOCETIN CO-FACTOR: 148 % (ref 42–200)
Von Willebrand Factor Ag: 153 % (ref 50–217)

## 2013-07-13 ENCOUNTER — Ambulatory Visit (HOSPITAL_COMMUNITY)
Admission: RE | Admit: 2013-07-13 | Discharge: 2013-07-13 | Disposition: A | Discharge status: Home / Self Care | Source: Ambulatory Visit | Attending: Family Medicine | Admitting: Family Medicine

## 2013-07-13 DIAGNOSIS — Z139 Encounter for screening, unspecified: Secondary | ICD-10-CM

## 2013-07-13 DIAGNOSIS — Z1231 Encounter for screening mammogram for malignant neoplasm of breast: Secondary | ICD-10-CM | POA: Insufficient documentation

## 2013-08-04 ENCOUNTER — Other Ambulatory Visit

## 2013-08-04 DIAGNOSIS — E785 Hyperlipidemia, unspecified: Secondary | ICD-10-CM

## 2013-08-04 LAB — LIPID PANEL
CHOLESTEROL: 131 mg/dL (ref 0–200)
HDL: 55 mg/dL (ref >39)
LDL Cholesterol: 59 mg/dL (ref 0–99)
Total CHOL/HDL Ratio: 2.4 Ratio
Triglycerides: 84 mg/dL (ref <150)
VLDL: 17 mg/dL (ref 0–40)

## 2013-08-19 ENCOUNTER — Telehealth: Payer: Self-pay | Admitting: *Deleted

## 2013-08-19 NOTE — Telephone Encounter (Signed)
Left message for pt to make her aware that Santiago Glad will not be here and she would be seeing Ofri.

## 2013-09-09 ENCOUNTER — Telehealth (HOSPITAL_COMMUNITY): Payer: Self-pay

## 2013-09-10 ENCOUNTER — Other Ambulatory Visit (HOSPITAL_COMMUNITY): Payer: Self-pay | Admitting: Oncology

## 2013-09-10 DIAGNOSIS — D6800 Von Willebrand disease, unspecified: Secondary | ICD-10-CM

## 2013-09-11 ENCOUNTER — Telehealth (HOSPITAL_COMMUNITY): Payer: Self-pay

## 2013-09-11 ENCOUNTER — Encounter (HOSPITAL_COMMUNITY): Payer: Self-pay | Admitting: Oncology

## 2013-09-11 ENCOUNTER — Other Ambulatory Visit (HOSPITAL_COMMUNITY): Payer: Self-pay | Admitting: Oncology

## 2013-09-11 DIAGNOSIS — D6800 Von Willebrand disease, unspecified: Secondary | ICD-10-CM

## 2013-09-11 HISTORY — DX: Von Willebrand's disease: D68.0

## 2013-09-11 HISTORY — DX: Von Willebrand disease, unspecified: D68.00

## 2013-09-11 MED ORDER — DESMOPRESSIN ACETATE SPRAY 0.01 % NA SOLN: 10.0000 ug | Freq: Two times a day (BID) | NASAL | Status: DC

## 2013-09-11 NOTE — Telephone Encounter (Signed)
Patient notified regarding infusion of DDAVP on 10/14/13 @ 8:45am prior to surgery.  Also instructed to use prescribed nasal spray for 3 days after surgery.

## 2013-09-17 ENCOUNTER — Ambulatory Visit (HOSPITAL_BASED_OUTPATIENT_CLINIC_OR_DEPARTMENT_OTHER): Admitting: Genetic Counselor

## 2013-09-17 ENCOUNTER — Other Ambulatory Visit

## 2013-09-17 DIAGNOSIS — C50919 Malignant neoplasm of unspecified site of unspecified female breast: Secondary | ICD-10-CM

## 2013-09-17 DIAGNOSIS — Z87898 Personal history of other specified conditions: Secondary | ICD-10-CM

## 2013-09-17 DIAGNOSIS — Z803 Family history of malignant neoplasm of breast: Secondary | ICD-10-CM

## 2013-09-17 DIAGNOSIS — Z801 Family history of malignant neoplasm of trachea, bronchus and lung: Secondary | ICD-10-CM

## 2013-09-17 NOTE — Progress Notes (Signed)
Patient Name: Caroline Valdez Patient Age: 63 y.o. Encounter Date: 09/17/2013  Referring Physician: Susy Frizzle, MD [address], [place] [address]  Primary Care Provider: [name], MD   [name], a 63 y.o. female, is being seen at the [facility] due to a personal and family history of breast cancer.  She presents to clinic today to discuss the possibility of a hereditary predisposition to cancer and discuss whether genetic testing is warranted.  HISTORY OF PRESENT ILLNESS: Caroline Valdez was diagnosed with left breast cancer (DCIS) in 2010 at the age of 78. she is s/p left mastectomy. The breast tumor was ER postive, PR positive and Her2Neu negative.  She reports having basal and squamous cell skin cancers removed from her forehead, chest and nose.  She reports having a total hysterectomy in 86 at age 30. She is up-to-date with her yearly mammogram and gynecologic exam. She had negative colonoscopies in 2001 and 2011.  Past Medical History  Diagnosis Date  . Fibromyalgia   . 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 Endometriosis   . Radiculopathy   . GERD (gastroesophageal reflux disease)   . Von Willebrand's disease     history of  . Hypercholesteremia   . Breast cancer     left  . Mini stroke 09/2012  . Von Willebrand disease 09/11/2013    Past Surgical History  Procedure Laterality Date  . Mastectomy      left  . Hemorrhoid surgery    . Abdominal hysterectomy    . Tonsillectomy    . Shoulder surgery    . Back surgery    . Laminectomy    . Carpal tunnel release      right  . Breast reconstruction      History   Social History  . Marital Status: Married    Spouse Name: N/A    Number of Children: N/A  . Years of Education: N/A   Social History Main Topics  . Smoking status: Never Smoker   . Smokeless tobacco: Never Used  . Alcohol Use: No  . Drug Use: No  . Sexual Activity: Not on file   Comment: husband recently died from myelodysplastic syndrome in January of 2014   Other Topics Concern  . Not on file   Social History Narrative  . No narrative on file     FAMILY HISTORY:   During the visit, a 4-generation pedigree was obtained. Significant diagnoses include the following:  Family History  Problem Relation Age of Onset  . Breast cancer Mother 7    now 71yo  . Stroke Father     Father had no full sisters  . Breast cancer Sister 8    now 68yo; had TAH/BSO  . Lung cancer Brother     died 31yo; smoker    Additionally, her sister who had breast cancer had a TAH/BSO (age unknown). Other than her mother, there are no cancers in any maternal relatives. Her paternal family is very limited. Her father only had a paternal half-sister. Her paternal grandmother died in her 57s and she has no information about her paternal grandfather.  Caroline Valdez ancestry is [place]. There is no known Jewish ancestry and no consanguinity.  ASSESSMENT AND PLAN: Caroline Valdez is a 63 y.o. female with a personal and family history of breast cancer. While she was not diagnosed young, this history is somewhat suggestive of a hereditary predisposition to cancer.  Her own TAH/BSO may have delayed the onset of her breast cancer. Given this, her sister's young age at breast cancer diagnosis, and the paucity of women in her father's family, genetic testing is warranted. We reviewed the characteristics, features and inheritance patterns of hereditary cancer syndromes. We also discussed genetic testing, including other appropriate family members to test, the process of testing, insurance coverage and implications of results. A negative result will be overall reassuring, but her sister is recommended to also undergo a genetics evaluation.  Caroline Valdez wished to pursue genetic testing and a blood sample will be sent to [facility] for analysis of 17 genes on the BreastNext panel. We discussed the  implications of a positive, negative and/ or Variant of Uncertain Significance (VUS) result. Results should be available in approximately 4-5 weeks, at which point we will contact her and address implications for her as well as address genetic testing for at-risk family members, if needed.    We encouraged Caroline Valdez to remain in contact with Cancer Genetics annually so that we can update the family history and inform her of any changes in cancer genetics and testing that may be of benefit for this family. Ms.  Valdez questions were answered to her satisfaction today.   Thank you for the referral and allowing [place] to share in the care of your patient.   The patient was seen for a total of 35 minutes, greater than 50% of which was spent face-to-face counseling. This patient was discussed with the referring provider who agrees with the above.

## 2013-09-18 ENCOUNTER — Encounter: Payer: Self-pay | Admitting: Family Medicine

## 2013-09-18 ENCOUNTER — Ambulatory Visit (INDEPENDENT_AMBULATORY_CARE_PROVIDER_SITE_OTHER): Admitting: Family Medicine

## 2013-09-18 VITALS — BP 110/74 | HR 78 | Temp 97.3°F | Resp 16 | Ht 65.5 in | Wt 135.0 lb

## 2013-09-18 DIAGNOSIS — Z0000 Encounter for general adult medical examination without abnormal findings: Secondary | ICD-10-CM

## 2013-09-18 NOTE — Progress Notes (Signed)
Subjective:    Patient ID: Caroline Valdez, female    DOB: 1951-02-05, 63 y.o.   MRN: [id number]  HPI Patient is here today for complete physical exam. She has no concerns. She has a spot underneath her left arm which is a C3 keratosis and completely benign. Otherwise she is doing well. She is scheduled to have torus resection in her mouth.  Due to her von Willebrand's disease they're pretty treating her with ddavp.  Otherwise she is doing well. Her mammogram was performed in January 2015 is up to date. She has a history of a hysterectomy and therefore does not require Pap smears. Her colonoscopy was performed in 2011 and is not due again until 2021. She is not due for Prevnar 13 until next year. She is not due for a booster on Pneumovax 23 until 2017. Her flu shot, tdap, and zostavax are UTD.   Past Medical History  Diagnosis Date  . Fibromyalgia   . 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 Endometriosis   . Radiculopathy   . GERD (gastroesophageal reflux disease)   . Von Willebrand's disease     history of  . Hypercholesteremia   . Breast cancer     left  . Mini stroke 09/2012  . Von Willebrand disease 09/11/2013   Current Outpatient Prescriptions on File Prior to Visit  Medication Sig Dispense Refill  . ALPRAZolam (XANAX) 0.5 MG tablet Take 1 tablet (0.5 mg total) by mouth at bedtime as needed.  60 tablet  1  . aspirin 81 MG tablet Take 81 mg by mouth daily.      . calcium carbonate (OS-CAL) 600 MG TABS Take 600 mg by mouth daily.       . cholecalciferol (VITAMIN D) 1000 UNITS tablet Take 1,000 Units by mouth daily.      Marland Kitchen desmopressin (DDAVP NASAL) 0.01 % solution Place 1 spray (10 mcg total) into the nose 2 (two) times daily.  5 mL  0  . DiphenhydrAMINE HCl, Sleep, (ZZZQUIL) 25 MG CAPS Take 25 mg by mouth at bedtime as needed. Takes 2 at bedtime as needed.      . Multiple Vitamins-Minerals (ICAPS MV PO) Take 1 capsule by mouth daily.       Marland Kitchen NEXIUM 40 MG capsule TAKE 1  CAPSULE DAILY  90 capsule  3  . simvastatin (ZOCOR) 40 MG tablet Take 40 mg by mouth daily.      Marland Kitchen zinc sulfate 220 MG capsule Take 220 mg by mouth daily.        . zoledronic acid (RECLAST) 5 MG/100ML SOLN Inject 5 mg into the vein once. yearly       No current facility-administered medications on file prior to visit.   No Known Allergies History   Social History  . Marital Status: Married    Spouse Name: N/A    Number of Children: N/A  . Years of Education: N/A   Occupational History  . Not on file.   Social History Main Topics  . Smoking status: Never Smoker   . Smokeless tobacco: Never Used  . Alcohol Use: No  . Drug Use: No  . Sexual Activity: Not on file     Comment: husband recently died from myelodysplastic syndrome in January of 2014   Other Topics Concern  . Not on file   Social History Narrative  . No narrative on file   Family History  Problem Relation Age of Onset  .  Breast cancer Mother 43    now 63yo  . Stroke Father     Father had no full sisters  . Breast cancer Sister 37    now 51yo; had TAH/BSO  . Lung cancer Brother     died 23yo; smoker      Review of Systems  All other systems reviewed and are negative.      Objective:   Physical Exam  Vitals reviewed. Constitutional: She is oriented to person, place, and time. She appears well-developed and well-nourished. No distress.  HENT:  Head: Normocephalic and atraumatic.  Right Ear: External ear normal.  Left Ear: External ear normal.  Nose: Nose normal.  Mouth/Throat: Oropharynx is clear and moist. No oropharyngeal exudate.  Eyes: Conjunctivae and EOM are normal. Pupils are equal, round, and reactive to light. Right eye exhibits no discharge. Left eye exhibits no discharge. No scleral icterus.  Neck: Normal range of motion. Neck supple. No JVD present. No tracheal deviation present. No thyromegaly present.  Cardiovascular: Normal rate, regular rhythm, normal heart sounds and intact distal  pulses.  Exam reveals no gallop and no friction rub.   No murmur heard. Pulmonary/Chest: Effort normal and breath sounds normal. No stridor. No respiratory distress. She has no wheezes. She has no rales. She exhibits no tenderness.  Abdominal: Soft. Bowel sounds are normal. She exhibits no distension and no mass. There is no tenderness. There is no rebound and no guarding.  Musculoskeletal: Normal range of motion. She exhibits no edema and no tenderness.  Lymphadenopathy:    She has no cervical adenopathy.  Neurological: She is alert and oriented to person, place, and time. She has normal reflexes. She displays normal reflexes. No cranial nerve deficit. She exhibits normal muscle tone. Coordination normal.  Skin: Skin is warm. No rash noted. She is not diaphoretic. No erythema. No pallor.  Psychiatric: She has a normal mood and affect. Her behavior is normal. Judgment and thought content normal.          Assessment & Plan:  Routine general medical examination at a health care facility  His physical exam is completely normal. Her screening tests are up to date. Her immunizations are up-to-date.  Resume Reclast after her upcoming surgery. I will repeat a bone density test in 2 years. The bone density test she had this year showed stable osteopenia in her lumbar spine with a T score of -2.4, and in both hips.  Otherwise she is doing well. I recheck her cholesterol and blood pressure in 6 months.

## 2013-09-25 ENCOUNTER — Ambulatory Visit (HOSPITAL_COMMUNITY)

## 2013-10-14 ENCOUNTER — Encounter (HOSPITAL_COMMUNITY): Attending: Hematology and Oncology

## 2013-10-14 VITALS — BP 135/76 | HR 78 | Temp 98.1°F | Resp 18

## 2013-10-14 DIAGNOSIS — D6800 Von Willebrand disease, unspecified: Secondary | ICD-10-CM

## 2013-10-14 MED ORDER — SODIUM CHLORIDE 0.9 % IV SOLN
20.0000 ug | Freq: Once | INTRAVENOUS | Status: AC
  Administered 2013-10-14: 20 ug via INTRAVENOUS
  Filled 2013-10-14: qty 5

## 2013-10-14 NOTE — Progress Notes (Signed)
IV access not discontinued d/t pt is going to have an OP surgery that will require conscious sedation.  Called Dr. Dorian Heckle office and confirmed this was okay with them prior to discharge.  IV access wrapped in kling for protection.

## 2013-10-19 ENCOUNTER — Encounter: Payer: Self-pay | Admitting: Genetic Counselor

## 2013-10-19 NOTE — Progress Notes (Signed)
Referring Physician: Jenna Luo, MD   [name] was called today to discuss genetic test results. Please see the Genetics note from her visit on 09/17/13.  GENETIC TESTING: At the time of [name]'s visit, we recommended she pursue genetic testing of multiple genes on the BreastNext gene panel. This test, which included sequencing and deletion/duplication analysis of 17 genes, was performed at [facility]. Testing was normal and did not reveal a mutation in these genes. The genes tested were ATM, BARD1, BRCA1, BRCA2, BRIP1, CDH1, CHEK2, MRE11A, MUTYH, NBN, NF1, PALB2, PTEN, RAD50, RAD51C, RAD51D, and TP53.  We discussed with [name] that since the current test is not perfect, it is possible there may be a gene mutation that current testing cannot detect, but that chance is small.  We also discussed that it is possible that a different genetic factor, which is not on this panel or has not yet been discovered, is responsible for the cancer diagnoses in the family. Finally, it may be that there is a detectable mutation in one of these genes in [name]'s family that she did not inherit and that her breast cancer was a sporadic phenocopy.  CANCER SCREENING: This result suggests that [name]'s cancer was most likely not due to an inherited predisposition. Most cancers happen by chance and this negative test, along with details of her personal history, suggests that her cancer falls into this category. We, therefore, recommended she continue to follow the cancer screening guidelines provided by her physician.   FAMILY MEMBERS: While these results are reassuring for [name], this test does not tell us anything about [name]'s sister's genetic status. Since she had breast cancer at age 19, she is recommended to have genetic counseling and testing. Please let us know if we can help facilitate testing. Genetic counselors can be located in other cities, by visiting the website of the  [employer] (ArtistMovie.se) and Field seismologist for a Dietitian by zip code. [name] is aware that her daughter and two unaffected sisters have a somewhat elevated risk of breast cancer due to the family history.    Lastly, we discussed with [name] that cancer genetics is a rapidly advancing field and it is possible that new genetic tests will be appropriate for her in the future. We encouraged her to remain in contact with [place] on an annual basis so we can update her personal and family histories, and let her know of advances in cancer genetics that may benefit the family. Our contact number was provided. [name] questions were answered to her satisfaction today, and she knows she is welcome to call anytime with additional questions.    Steele Berg, MS, [place] Certified Genetic Counseor phone: [phone] [email or web address]

## 2013-11-17 ENCOUNTER — Other Ambulatory Visit: Payer: Self-pay | Admitting: Family Medicine

## 2013-11-26 ENCOUNTER — Encounter: Payer: Self-pay | Admitting: Physician Assistant

## 2013-11-26 ENCOUNTER — Ambulatory Visit (INDEPENDENT_AMBULATORY_CARE_PROVIDER_SITE_OTHER): Admitting: Physician Assistant

## 2013-11-26 VITALS — BP 130/76 | HR 72 | Temp 98.0°F | Resp 14

## 2013-11-26 DIAGNOSIS — A6920 Lyme disease, unspecified: Secondary | ICD-10-CM

## 2013-11-26 DIAGNOSIS — K645 Perianal venous thrombosis: Secondary | ICD-10-CM

## 2013-11-26 MED ORDER — HYDROCODONE-ACETAMINOPHEN 5-325 MG PO TABS: 1.0000 | ORAL_TABLET | Freq: Four times a day (QID) | ORAL | Status: DC | PRN

## 2013-11-26 MED ORDER — DOXYCYCLINE HYCLATE 100 MG PO TABS: 100.0000 mg | ORAL_TABLET | Freq: Two times a day (BID) | ORAL | Status: DC

## 2013-11-27 ENCOUNTER — Encounter (INDEPENDENT_AMBULATORY_CARE_PROVIDER_SITE_OTHER): Admitting: Surgery

## 2013-11-28 NOTE — Progress Notes (Signed)
Patient ID: KEAH LAMBA MRN: [id number], DOB: 1951-05-08, 63 y.o. Date of Encounter: @DATE @  Chief Complaint:  Chief Complaint  Patient presents with  . Hemmorrohoids    x 3 days- has been using creams and suppositories, but it keeps getting worse  . Bug Bite    x 3 days- back of L shoulder- no bug removed, just noticed it was a bite  . Medication Management    Reclast was held d/t possible oral surgery- surgery cancelled- should she resume medication    HPI: 63 y.o. year old white female  presents with the above concerns.  States she has had surgery on hemorrhoids twice in the past. She's been using some suppositories and external cream. She can feel one external hemorrhoid it is extremely painful.  On the night of Sunday 11/22/13, she noticed an itchy area on her left upper back.  Since then has noticed an area of pink coloration that appears to be in a bulls eye pattern. She never actually saw a tick there or removed a tic from the spot.  She also has large tori in her mouth. As the surgeon was going to do surgery in May at the last minute canceled. He had her stay off Reclast for one year prior to that planned surgery. Now is wanting her to stay off of it in case they need to do surgery in the future.   Past Medical History  Diagnosis Date  . Fibromyalgia   . 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 Endometriosis   . Radiculopathy   . GERD (gastroesophageal reflux disease)   . Von Willebrand's disease     history of  . Hypercholesteremia   . Breast cancer     left  . Mini stroke 09/2012  . Von Willebrand disease 09/11/2013     Home Meds: Outpatient Prescriptions Prior to Visit  Medication Sig Dispense Refill  . ALPRAZolam (XANAX) 0.5 MG tablet Take 1 tablet (0.5 mg total) by mouth at bedtime as needed.  60 tablet  1  . aspirin 81 MG tablet Take 81 mg by mouth daily.      . calcium carbonate (OS-CAL) 600 MG TABS Take 600 mg by mouth daily.       .  cholecalciferol (VITAMIN D) 1000 UNITS tablet Take 1,000 Units by mouth daily.      . DiphenhydrAMINE HCl, Sleep, (ZZZQUIL) 25 MG CAPS Take 25 mg by mouth at bedtime as needed. Takes 2 at bedtime as needed.      . Multiple Vitamins-Minerals (ICAPS MV PO) Take 1 capsule by mouth daily.       Marland Kitchen NEXIUM 40 MG capsule TAKE 1 CAPSULE DAILY  90 capsule  3  . zinc sulfate 220 MG capsule Take 220 mg by mouth daily.        . simvastatin (ZOCOR) 40 MG tablet Take 40 mg by mouth daily.      . zoledronic acid (RECLAST) 5 MG/100ML SOLN Inject 5 mg into the vein once. yearly      . desmopressin (DDAVP NASAL) 0.01 % solution Place 1 spray (10 mcg total) into the nose 2 (two) times daily.  5 mL  0   No facility-administered medications prior to visit.    Allergies: No Known Allergies  History   Social History  . Marital Status: Married    Spouse Name: N/A    Number of Children: N/A  . Years of Education: N/A   Occupational  History  . Not on file.   Social History Main Topics  . Smoking status: Never Smoker   . Smokeless tobacco: Never Used  . Alcohol Use: No  . Drug Use: No  . Sexual Activity: Not on file     Comment: husband recently died from myelodysplastic syndrome in January of 2014   Other Topics Concern  . Not on file   Social History Narrative  . No narrative on file    Family History  Problem Relation Age of Onset  . Breast cancer Mother 81    now 42yo  . Stroke Father     Father had no full sisters  . Breast cancer Sister 63    now 56yo; had TAH/BSO  . Lung cancer Brother     died 24yo; smoker     Review of Systems:  See HPI for pertinent ROS. All other ROS negative.    Physical Exam: Blood pressure 130/76, pulse 72, temperature 98 F (36.7 C), temperature source Oral, resp. rate 14., There is no weight on file to calculate BMI. General: WNWD WF. Lying on her side, on the exam table, to prevent pressure on her rectal area.  Lungs: Clear bilaterally to auscultation  without wheezes, rales, or rhonchi. Breathing is unlabored. Heart: RRR with S1 S2. No murmurs, rubs, or gallops. Musculoskeletal:  Strength and tone normal for age. Anus/Rectum: On the left side, there is an approximate 1 cm "ball" that is extremely firm and hard. It is erythematous. The tip is in a red/purple area. Skin: Lateral aspect of left upper back/lateral aspect of upper scapula--there is a small 3 mm size area in the center which looks like the site of a prior tick. Around this is a circular round pink macular rash in a bulls-eye pattern. Neuro: Alert and oriented X 3. Moves all extremities spontaneously. Gait is normal. CNII-XII grossly in tact. Psych:  Responds to questions appropriately with a normal affect.     ASSESSMENT AND PLAN:  63 y.o. year old female with  1. External hemorrhoid, thrombosed - Ambulatory referral to General Surgery-- we were able to get her in with a surgeon tomorrow. Prescribeded hydrocodone to be using for pain. In addition to continue her topical treatments. - HYDROcodone-acetaminophen (NORCO/VICODIN) 5-325 MG per tablet; Take 1 tablet by mouth every 6 (six) hours as needed.  Dispense: 30 tablet; Refill: 0  2. Erythema migrans (Lyme disease) Discussed that the site is consistent with a previous tick bite now with erythema migrans. Discussed doing titers. Patient defers. I told her that regardless of the titer results, I would recommend that she go ahead and start doxycycline and complete a course of doxycycline. - doxycycline (VIBRA-TABS) 100 MG tablet; Take 1 tablet (100 mg total) by mouth 2 (two) times daily.  Dispense: 28 tablet; Refill: 0   Signed, [address] [place], [place], [facility] 11/28/2013 5:59 AM

## 2014-01-28 ENCOUNTER — Telehealth: Payer: Self-pay | Admitting: Family Medicine

## 2014-01-28 MED ORDER — ACYCLOVIR 400 MG PO TABS: 400.0000 mg | ORAL_TABLET | Freq: Three times a day (TID) | ORAL | Status: DC

## 2014-01-28 NOTE — Telephone Encounter (Signed)
Prescription sent to pharmacy. .   Call placed to patient and patient made aware.  

## 2014-01-28 NOTE — Telephone Encounter (Signed)
[phone] Pharmacy [facility] [address] [place]  Patient needs refill on her fever blister medication it has no refills

## 2014-01-28 NOTE — Telephone Encounter (Signed)
ok 

## 2014-01-28 NOTE — Telephone Encounter (Signed)
Ok to refill Acyclovir?

## 2014-03-05 ENCOUNTER — Other Ambulatory Visit: Payer: Self-pay

## 2014-04-20 ENCOUNTER — Encounter: Payer: Self-pay | Admitting: Family Medicine

## 2014-04-20 ENCOUNTER — Ambulatory Visit (INDEPENDENT_AMBULATORY_CARE_PROVIDER_SITE_OTHER): Payer: Medicare Other | Admitting: Family Medicine

## 2014-04-20 VITALS — BP 126/74 | HR 68 | Temp 98.3°F | Resp 16 | Ht 65.5 in | Wt 133.0 lb

## 2014-04-20 DIAGNOSIS — E785 Hyperlipidemia, unspecified: Secondary | ICD-10-CM

## 2014-04-20 DIAGNOSIS — G459 Transient cerebral ischemic attack, unspecified: Secondary | ICD-10-CM | POA: Diagnosis not present

## 2014-04-20 LAB — CBC WITH DIFFERENTIAL/PLATELET
Basophils Absolute: 0.1 10*3/uL (ref 0.0–0.1)
Basophils Relative: 1 % (ref 0–1)
Eosinophils Absolute: 0.1 10*3/uL (ref 0.0–0.7)
Eosinophils Relative: 1 % (ref 0–5)
HCT: 42.5 % (ref 36.0–46.0)
Hemoglobin: 14.7 g/dL (ref 12.0–15.0)
Lymphocytes Relative: 33 % (ref 12–46)
Lymphs Abs: 1.7 10*3/uL (ref 0.7–4.0)
MCH: 32.2 pg (ref 26.0–34.0)
MCHC: 34.6 g/dL (ref 30.0–36.0)
MCV: 93.2 fL (ref 78.0–100.0)
MPV: 10.6 fL (ref 9.4–12.4)
Monocytes Absolute: 0.4 10*3/uL (ref 0.1–1.0)
Monocytes Relative: 8 % (ref 3–12)
Neutro Abs: 3 10*3/uL (ref 1.7–7.7)
Neutrophils Relative %: 57 % (ref 43–77)
Platelets: 207 10*3/uL (ref 150–400)
RBC: 4.56 MIL/uL (ref 3.87–5.11)
RDW: 13.2 % (ref 11.5–15.5)
WBC: 5.2 10*3/uL (ref 4.0–10.5)

## 2014-04-20 LAB — COMPLETE METABOLIC PANEL WITH GFR
ALT: 21 U/L (ref 0–35)
AST: 27 U/L (ref 0–37)
Albumin: 4.3 g/dL (ref 3.5–5.2)
Alkaline Phosphatase: 65 U/L (ref 39–117)
BUN: 13 mg/dL (ref 6–23)
CO2: 31 mEq/L (ref 19–32)
Calcium: 9.2 mg/dL (ref 8.4–10.5)
Chloride: 105 mEq/L (ref 96–112)
Creat: 0.85 mg/dL (ref 0.50–1.10)
GFR, Est African American: 84 mL/min
GFR, Est Non African American: 73 mL/min
Glucose, Bld: 99 mg/dL (ref 70–99)
Potassium: 4.9 mEq/L (ref 3.5–5.3)
Sodium: 143 mEq/L (ref 135–145)
Total Bilirubin: 0.7 mg/dL (ref 0.2–1.2)
Total Protein: 6.6 g/dL (ref 6.0–8.3)

## 2014-04-20 LAB — LIPID PANEL
CHOLESTEROL: 136 mg/dL (ref 0–200)
HDL: 63 mg/dL (ref >39)
LDL CALC: 52 mg/dL (ref 0–99)
Total CHOL/HDL Ratio: 2.2 Ratio
Triglycerides: 105 mg/dL (ref <150)
VLDL: 21 mg/dL (ref 0–40)

## 2014-04-20 MED ORDER — ESOMEPRAZOLE MAGNESIUM 40 MG PO CPDR: 40.0000 mg | DELAYED_RELEASE_CAPSULE | Freq: Every day | ORAL | Status: DC

## 2014-04-20 MED ORDER — ATORVASTATIN CALCIUM 40 MG PO TABS: 40.0000 mg | ORAL_TABLET | Freq: Every day | ORAL | Status: DC

## 2014-04-20 NOTE — Progress Notes (Signed)
Subjective:    Patient ID: Caroline Valdez, female    DOB: [birth year], 63 y.o.   MRN: [id number]  HPI Patient has a history of a TIA as well as hyperlipidemia. She is currently on aspirin 81 mg by mouth daily for prevention of stroke. She is also taking atorvastatin 40 mg by mouth daily. She denies any myalgias or right upper quadrant pain. Her immunizations are up-to-date. She is overdue for a fasting lipid panel and CMP. Her mammogram is up-to-date. She has a history of a hysterectomy and therefore does not require Pap smears. Her last colonoscopy was in 2011. Past Medical History  Diagnosis Date  . Fibromyalgia   . 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 Endometriosis   . Radiculopathy   . GERD (gastroesophageal reflux disease)   . Von Willebrand's disease     history of  . Hypercholesteremia   . Breast cancer     left  . Mini stroke 09/2012  . Von Willebrand disease 09/11/2013   Past Surgical History  Procedure Laterality Date  . Mastectomy      left  . Hemorrhoid surgery    . Abdominal hysterectomy    . Tonsillectomy    . Shoulder surgery    . Back surgery    . Laminectomy    . Carpal tunnel release      right  . Breast reconstruction     Current Outpatient Prescriptions on File Prior to Visit  Medication Sig Dispense Refill  . acyclovir (ZOVIRAX) 400 MG tablet Take 1 tablet (400 mg total) by mouth 3 (three) times daily. 30 tablet 0  . ALPRAZolam (XANAX) 0.5 MG tablet Take 1 tablet (0.5 mg total) by mouth at bedtime as needed. 60 tablet 1  . aspirin 81 MG tablet Take 81 mg by mouth daily.    . calcium carbonate (OS-CAL) 600 MG TABS Take 600 mg by mouth daily.     . cholecalciferol (VITAMIN D) 1000 UNITS tablet Take 1,000 Units by mouth daily.    . DiphenhydrAMINE HCl, Sleep, (ZZZQUIL) 25 MG CAPS Take 25 mg by mouth at bedtime as needed. Takes 2 at bedtime as needed.    . Multiple Vitamins-Minerals (ICAPS MV PO) Take 1 capsule by mouth daily.     Marland Kitchen zinc  sulfate 220 MG capsule Take 220 mg by mouth daily.      . zoledronic acid (RECLAST) 5 MG/100ML SOLN Inject 5 mg into the vein once. yearly     No current facility-administered medications on file prior to visit.   No Known Allergies History   Social History  . Marital Status: Married    Spouse Name: N/A    Number of Children: N/A  . Years of Education: N/A   Occupational History  . Not on file.   Social History Main Topics  . Smoking status: Never Smoker   . Smokeless tobacco: Never Used  . Alcohol Use: No  . Drug Use: No  . Sexual Activity: Not on file     Comment: husband recently died from myelodysplastic syndrome in January of 2014   Other Topics Concern  . Not on file   Social History Narrative      Review of Systems  All other systems reviewed and are negative.      Objective:   Physical Exam  Constitutional: She is oriented to person, place, and time. She appears well-developed and well-nourished.  Neck: Neck supple. No JVD present.  Cardiovascular: Normal  rate, regular rhythm and normal heart sounds.   No murmur heard. Pulmonary/Chest: Effort normal and breath sounds normal. No respiratory distress. She has no wheezes. She has no rales.  Abdominal: Soft. Bowel sounds are normal. She exhibits no distension. There is no tenderness. There is no rebound and no guarding.  Musculoskeletal: She exhibits no edema.  Neurological: She is alert and oriented to person, place, and time. She has normal reflexes. She displays normal reflexes. No cranial nerve deficit. She exhibits normal muscle tone. Coordination normal.  Vitals reviewed.         Assessment & Plan:  HLD (hyperlipidemia) - Plan: COMPLETE METABOLIC PANEL WITH GFR, Lipid panel, CBC with Differential  Transient cerebral ischemia, unspecified transient cerebral ischemia type  His blood pressure is excellent. Her preventative care is up-to-date. I will check a CMP as well as fasting lipid panel to  monitor her hyperlipidemia. Goal LDL cholesterol is less than 70.

## 2014-04-22 ENCOUNTER — Encounter: Payer: Self-pay | Admitting: Family Medicine

## 2014-05-31 ENCOUNTER — Ambulatory Visit (INDEPENDENT_AMBULATORY_CARE_PROVIDER_SITE_OTHER): Payer: Medicare Other | Admitting: Physician Assistant

## 2014-05-31 ENCOUNTER — Encounter: Payer: Self-pay | Admitting: Physician Assistant

## 2014-05-31 VITALS — BP 118/76 | HR 80 | Temp 98.2°F | Resp 18 | Wt 135.0 lb

## 2014-05-31 DIAGNOSIS — C50912 Malignant neoplasm of unspecified site of left female breast: Secondary | ICD-10-CM | POA: Diagnosis not present

## 2014-05-31 DIAGNOSIS — R0789 Other chest pain: Secondary | ICD-10-CM

## 2014-05-31 MED ORDER — PREDNISONE 20 MG PO TABS: ORAL_TABLET | ORAL | Status: DC

## 2014-05-31 NOTE — Progress Notes (Signed)
Patient ID: Caroline Valdez MRN: [id number], DOB: 05-26-1950, 64 y.o. Date of Encounter: 05/31/2014, 10:08 AM    Chief Complaint:  Chief Complaint  Patient presents with  . left breast pinching    hx of mastectomy with implant on left side     HPI: 64 y.o. year old white female here with the above complaint.  She reports that she has a history of left breast cancer. Says that she underwent left mastectomy 5 years ago. Says the mastectomy was performed by Dr. Arnoldo Morale in [place]. Says that about 6 months later she had the implant. Says that was done by plastic surgeon in [place]. However says that that plastic surgeon does not take Medicare. Patient did not have Medicare at the         time of her prior surgery but now has Medicare.  Patient says that she was having no pain or symptoms until 4 days ago. Says that for the past 4 days she feels a pinching sensation at times and other times feeling of needles sticking her.  Points to the very tip/front area (where areola would be) of the left breast as the area where she is feeling these sensations. It is at the area of scar. She says that she also feels these sensations if she takes a deep breath or moves her body certain ways/positions.  Says that she has not done any upper body exercise and has been doing no upper body activity or lifting etc. Has done nothing that she can think of that would have irritated the site recently. Says that the breast itself and the remainder of the tissue is not tender with palpation and there is no tenderness with palpation.     Home Meds:   Outpatient Prescriptions Prior to Visit  Medication Sig Dispense Refill  . ALPRAZolam (XANAX) 0.5 MG tablet Take 1 tablet (0.5 mg total) by mouth at bedtime as needed. 60 tablet 1  . aspirin 81 MG tablet Take 81 mg by mouth daily.    Marland Kitchen atorvastatin (LIPITOR) 40 MG tablet Take 1 tablet (40 mg total) by mouth daily. 90 tablet 3  . calcium carbonate (OS-CAL)  600 MG TABS Take 600 mg by mouth daily.     . cholecalciferol (VITAMIN D) 1000 UNITS tablet Take 1,000 Units by mouth daily.    . DiphenhydrAMINE HCl, Sleep, (ZZZQUIL) 25 MG CAPS Take 25 mg by mouth at bedtime as needed. Takes 2 at bedtime as needed.    Marland Kitchen esomeprazole (NEXIUM) 40 MG capsule Take 1 capsule (40 mg total) by mouth daily. 90 capsule 3  . Multiple Vitamins-Minerals (ICAPS MV PO) Take 1 capsule by mouth daily.     Marland Kitchen acyclovir (ZOVIRAX) 400 MG tablet Take 1 tablet (400 mg total) by mouth 3 (three) times daily. 30 tablet 0  . zinc sulfate 220 MG capsule Take 220 mg by mouth daily.      . zoledronic acid (RECLAST) 5 MG/100ML SOLN Inject 5 mg into the vein once. yearly     No facility-administered medications prior to visit.    Allergies: No Known Allergies    Review of Systems: See HPI for pertinent ROS. All other ROS negative.    Physical Exam: Blood pressure 118/76, pulse 80, temperature 98.2 F (36.8 C), temperature source Oral, resp. rate 18, weight 135 lb (61.236 kg)., Body mass index is 22.12 kg/(m^2). General:  WNWD WF. Appears in no acute distress. Neck: Supple. No thyromegaly. No lymphadenopathy. Left Breast: She  points to area where she feels "pinching" and "needles prickling her". This area is at anterior of breast (where areola would be) and at scar site.  Lungs: Clear bilaterally to auscultation without wheezes, rales, or rhonchi. Breathing is unlabored. Heart: Regular rhythm. No murmurs, rubs, or gallops. Msk:  Strength and tone normal for age. Extremities/Skin: Warm and dry. Neuro: Alert and oriented X 3. Moves all extremities spontaneously. Gait is normal. CNII-XII grossly in tact. Psych:  Responds to questions appropriately with a normal affect.     ASSESSMENT AND PLAN:  64 y.o. year old female with  1. Musculoskeletal chest pain - predniSONE (DELTASONE) 20 MG tablet; Take 3 daily for 2 days, then 2 daily for 2 days, then 1 daily for 2 days.  Dispense: 12  tablet; Refill: 0  Discussed with patient that I think she has an irritated nerve at the scar tissue. Use prednisone to see if this will resolve the irritation/inflammation. Told her if symptoms do not resolve at completion of prednisone and call me. Also if symptoms resolved while on prednisone but then re-occur then call me as well. If either of these situations occur then I probably would refer her back to Dr. Arnoldo Morale in [place] for further evaluation/treatment.   2. Breast cancer, left    Signed, [address] [place], [place], [facility] 05/31/2014 10:08 AM

## 2014-06-10 ENCOUNTER — Other Ambulatory Visit: Payer: Self-pay | Admitting: Family Medicine

## 2014-06-10 DIAGNOSIS — Z09 Encounter for follow-up examination after completed treatment for conditions other than malignant neoplasm: Secondary | ICD-10-CM

## 2014-06-29 ENCOUNTER — Encounter (HOSPITAL_BASED_OUTPATIENT_CLINIC_OR_DEPARTMENT_OTHER): Payer: Medicare Other

## 2014-06-29 ENCOUNTER — Encounter (HOSPITAL_COMMUNITY): Payer: Medicare Other | Attending: Hematology & Oncology | Admitting: Hematology & Oncology

## 2014-06-29 VITALS — BP 143/69 | HR 84 | Temp 98.4°F | Resp 16 | Wt 134.9 lb

## 2014-06-29 DIAGNOSIS — D6800 Von Willebrand disease, unspecified: Secondary | ICD-10-CM

## 2014-06-29 DIAGNOSIS — M810 Age-related osteoporosis without current pathological fracture: Secondary | ICD-10-CM | POA: Diagnosis not present

## 2014-06-29 DIAGNOSIS — M8580 Other specified disorders of bone density and structure, unspecified site: Secondary | ICD-10-CM | POA: Diagnosis not present

## 2014-06-29 DIAGNOSIS — C50912 Malignant neoplasm of unspecified site of left female breast: Secondary | ICD-10-CM | POA: Diagnosis not present

## 2014-06-29 DIAGNOSIS — Z853 Personal history of malignant neoplasm of breast: Secondary | ICD-10-CM | POA: Diagnosis not present

## 2014-06-29 LAB — COMPREHENSIVE METABOLIC PANEL
ALBUMIN: 4.4 g/dL (ref 3.5–5.2)
ALK PHOS: 64 U/L (ref 39–117)
ALT: 24 U/L (ref 0–35)
AST: 29 U/L (ref 0–37)
Anion gap: 5 (ref 5–15)
BUN: 19 mg/dL (ref 6–23)
CALCIUM: 9.2 mg/dL (ref 8.4–10.5)
CO2: 29 mmol/L (ref 19–32)
Chloride: 107 mmol/L (ref 96–112)
Creatinine, Ser: 0.71 mg/dL (ref 0.50–1.10)
GFR, EST NON AFRICAN AMERICAN: 89 mL/min — AB (ref >90)
GLUCOSE: 96 mg/dL (ref 70–99)
Potassium: 3.9 mmol/L (ref 3.5–5.1)
SODIUM: 141 mmol/L (ref 135–145)
TOTAL PROTEIN: 7.2 g/dL (ref 6.0–8.3)
Total Bilirubin: 0.9 mg/dL (ref 0.3–1.2)

## 2014-06-29 NOTE — Patient Instructions (Signed)
[facility] Discharge Instructions  RECOMMENDATIONS MADE BY THE CONSULTANT AND ANY TEST RESULTS WILL BE SENT TO YOUR REFERRING PHYSICIAN.  SPECIAL INSTRUCTIONS/FOLLOW-UP:  Please call prior to your next visit with any problems or concerns. We will see you back again in one year  with labs and an office visit.  I have ordered your Reclast. The nurses will work with you to schedule your infusion. Please call if you have persistent discomfort in the left breast and I will order an ultrasound.  Thank you for choosing [facility] to provide your oncology and hematology care.  To afford each patient quality time with our providers, please arrive at least 15 minutes before your scheduled appointment time.  With your help, our goal is to use those 15 minutes to complete the necessary work-up to ensure our physicians have the information they need to help with your evaluation and healthcare recommendations.    Effective January 1st, 2014, we ask that you re-schedule your appointment with our physicians should you arrive 10 or more minutes late for your appointment.  We strive to give you quality time with our providers, and arriving late affects you and other patients whose appointments are after yours.    Again, thank you for choosing [facility].  Our hope is that these requests will decrease the amount of time that you wait before being seen by our physicians.       _____________________________________________________________  Should you have questions after your visit to [facility], please contact our office at [phone] between the hours of 8:30 a.m. and 5:00 p.m.  Voicemails left after 4:30 p.m. will not be returned until the following business day.  For prescription refill requests, have your pharmacy contact our office with your prescription refill request.

## 2014-06-29 NOTE — Progress Notes (Signed)
Labs for cmp,[id number]

## 2014-06-29 NOTE — Progress Notes (Signed)
Caroline Fraction, MD [address] [place] [address]   DIAGNOSIS: Noninvasive ductal neoplasia the left breast, microinvasion, status post left mastectomy and reconstruction together with right breast reduction and implant, no evidence of disease. Surgery was performed in December 2010.  DCIS with questionable microinvasion.  Er+, PR-. Tumor size approximately 2 cm  Patient did not pursue endocrine therapy  Von Willebrand's disease, asymptomatic. Evaluated by [name] in the past.   [place] expectation, intermittent use of DDAVP prior to surgical interventions for von Willebrand's disease  INTERVAL HISTORY: Caroline Valdez 64 y.o. female returns for follow-up of her breast cancer diagnosed in 2010. She has no major complaints today. She is doing well. She denies any bleeding or bruising. Her energy is good. She describes her appetite is unchanged. She will be due again for unilateral mammogram the end of February.  MEDICAL HISTORY: Past Medical History  Diagnosis Date  . Fibromyalgia   . 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 Endometriosis   . Radiculopathy   . GERD (gastroesophageal reflux disease)   . Von Willebrand's disease     history of  . Hypercholesteremia   . Breast cancer     left  . Mini stroke 09/2012  . Von Willebrand disease 09/11/2013    has Radiculopathy; Fibromyalgia; Endometriosis; Hypercholesteremia; Breast cancer; Von Willebrand disease; and Osteoporosis on her problem list.     has No Known Allergies.  Ms. Laham had no medications administered during this visit.  SURGICAL HISTORY: Past Surgical History  Procedure Laterality Date  . Mastectomy      left  . Hemorrhoid surgery    . Abdominal hysterectomy    . Tonsillectomy    . Shoulder surgery    . Back surgery    . Laminectomy    . Carpal tunnel release      right  . Breast reconstruction      SOCIAL HISTORY: History   Social History    . Marital Status: Married    Spouse Name: N/A  . Number of Children: N/A  . Years of Education: N/A   Occupational History  . Not on file.   Social History Main Topics  . Smoking status: Never Smoker   . Smokeless tobacco: Never Used  . Alcohol Use: No  . Drug Use: No  . Sexual Activity: Not on file     Comment: husband recently died from myelodysplastic syndrome in January of 2014   Other Topics Concern  . Not on file   Social History Narrative    FAMILY HISTORY: Family History  Problem Relation Age of Onset  . Breast cancer Mother 32    now 33yo  . Stroke Father     Father had no full sisters  . Breast cancer Sister 73    now 49yo; had TAH/BSO  . Lung cancer Brother     died 8yo; smoker    Review of Systems  Constitutional: Negative for fever, chills, weight loss and malaise/fatigue.  HENT: Negative for congestion, hearing loss, nosebleeds, sore throat and tinnitus.   Eyes: Negative for blurred vision, double vision, pain and discharge.  Respiratory: Negative for cough, hemoptysis, sputum production, shortness of breath and wheezing.   Cardiovascular: Negative for chest pain, palpitations, claudication, leg swelling and PND.  Gastrointestinal: Negative for heartburn, nausea, vomiting, abdominal pain, diarrhea, constipation, blood in stool and melena.  Genitourinary: Negative for dysuria, urgency, frequency and hematuria.  Musculoskeletal: Negative  for myalgias, joint pain and falls.  Skin: Negative for itching and rash.  Neurological: Negative for dizziness, tingling, tremors, sensory change, speech change, focal weakness, seizures, loss of consciousness, weakness and headaches.  Endo/Heme/Allergies: Does not bruise/bleed easily.  Psychiatric/Behavioral: Negative for depression, suicidal ideas, memory loss and substance abuse. The patient is not nervous/anxious and does not have insomnia.     PHYSICAL EXAMINATION  ECOG PERFORMANCE STATUS: 0 -  Asymptomatic  Filed Vitals:   06/29/14 1033  BP: 143/69  Pulse: 84  Temp: 98.4 F (36.9 C)  Resp: 16    Physical Exam  Constitutional: She is oriented to person, place, and time and well-developed, well-nourished, and in no distress.  HENT:  Head: Normocephalic and atraumatic.  Nose: Nose normal.  Mouth/Throat: Oropharynx is clear and moist. No oropharyngeal exudate.  Eyes: Conjunctivae and EOM are normal. Pupils are equal, round, and reactive to light. Right eye exhibits no discharge. Left eye exhibits no discharge. No scleral icterus.  Neck: Normal range of motion. Neck supple. No tracheal deviation present. No thyromegaly present.  Cardiovascular: Normal rate, regular rhythm and normal heart sounds.  Exam reveals no gallop and no friction rub.   No murmur heard. Pulmonary/Chest: Effort normal and breath sounds normal. She has no wheezes. She has no rales.    Abdominal: Soft. Bowel sounds are normal. She exhibits no distension and no mass. There is no tenderness. There is no rebound and no guarding.  Musculoskeletal: Normal range of motion. She exhibits no edema.  Lymphadenopathy:    She has no cervical adenopathy.  Neurological: She is alert and oriented to person, place, and time. She has normal reflexes. No cranial nerve deficit. Gait normal. Coordination normal.  Skin: Skin is warm and dry. No rash noted.  Psychiatric: Mood, memory, affect and judgment normal.  Nursing note and vitals reviewed.   LABORATORY DATA:  CBC    Component Value Date/Time   WBC 5.2 04/20/2014 0925   RBC 4.56 04/20/2014 0925   HGB 14.7 04/20/2014 0925   HCT 42.5 04/20/2014 0925   PLT 207 04/20/2014 0925   MCV 93.2 04/20/2014 0925   MCH 32.2 04/20/2014 0925   MCHC 34.6 04/20/2014 0925   RDW 13.2 04/20/2014 0925   LYMPHSABS 1.7 04/20/2014 0925   MONOABS 0.4 04/20/2014 0925   EOSABS 0.1 04/20/2014 0925   BASOSABS 0.1 04/20/2014 0925   CMP     Component Value Date/Time   NA 141  06/29/2014 1129   K 3.9 06/29/2014 1129   CL 107 06/29/2014 1129   CO2 29 06/29/2014 1129   GLUCOSE 96 06/29/2014 1129   BUN 19 06/29/2014 1129   CREATININE 0.71 06/29/2014 1129   CREATININE 0.85 04/20/2014 0925   CALCIUM 9.2 06/29/2014 1129   PROT 7.2 06/29/2014 1129   ALBUMIN 4.4 06/29/2014 1129   AST 29 06/29/2014 1129   ALT 24 06/29/2014 1129   ALKPHOS 64 06/29/2014 1129   BILITOT 0.9 06/29/2014 1129   GFRNONAA 89* 06/29/2014 1129   GFRNONAA 73 04/20/2014 0925   GFRAA >90 06/29/2014 1129   GFRAA 84 04/20/2014 0925      RADIOGRAPHIC STUDIES:  DEXA 07/2013 with osteopenia/osteoporosis    ASSESSMENT and THERAPY PLAN:    Breast cancer Pleasant 64 year old female DCIS of the left breast, questionable microinvasive disease. He was treated with mastectomy and reconstruction. Tumor was ER positive no endocrine therapy was pursued. She will be due again for screening mammogram of the right breast at the end of February  and we will arrange this for her. Breast exam is unremarkable today. She is doing well without evidence of recurrence and we will see her back in one year.   Von Willebrand disease Von Willebrand's disease has been evaluated by Dr. Gaylyn Cheers at [facility]. She currently has that well-controlled she uses DDAVP when necessary. She is currently having no difficulties.   Osteoporosis She has osteoporosis. She takes calcium, vitamin D, and is on yearly re-class. She is currently doing fairly well and has no major complaints tolerating her calcium and vitamin D. She was encouraged to continue.     All questions were answered. The patient knows to call the clinic with any problems, questions or concerns. We can certainly see the patient much sooner if necessary.  Molli Hazard 07/14/2014

## 2014-06-30 LAB — VITAMIN D 25 HYDROXY (VIT D DEFICIENCY, FRACTURES): Vit D, 25-Hydroxy: 49.8 ng/mL (ref 30.0–100.0)

## 2014-07-02 ENCOUNTER — Encounter (HOSPITAL_COMMUNITY): Payer: Self-pay

## 2014-07-02 ENCOUNTER — Encounter (HOSPITAL_BASED_OUTPATIENT_CLINIC_OR_DEPARTMENT_OTHER): Payer: Medicare Other

## 2014-07-02 VITALS — BP 120/65 | HR 79 | Temp 97.9°F | Resp 18 | Wt 135.8 lb

## 2014-07-02 DIAGNOSIS — M810 Age-related osteoporosis without current pathological fracture: Secondary | ICD-10-CM

## 2014-07-02 DIAGNOSIS — M8580 Other specified disorders of bone density and structure, unspecified site: Secondary | ICD-10-CM

## 2014-07-02 MED ORDER — SODIUM CHLORIDE 0.9 % IV SOLN
INTRAVENOUS | Status: DC
  Administered 2014-07-02: 12:00:00 via INTRAVENOUS

## 2014-07-02 MED ORDER — ZOLEDRONIC ACID 5 MG/100ML IV SOLN
5.0000 mg | Freq: Once | INTRAVENOUS | Status: AC
  Administered 2014-07-02: 5 mg via INTRAVENOUS
  Filled 2014-07-02: qty 100

## 2014-07-02 NOTE — Patient Instructions (Signed)
[place] at [facility]  Discharge Instructions:  You had your reclast infusion today.  Follow up as scheduled.  Call the clinic if you have any questions or concerns _______________________________________________________________  Thank you for choosing [place] at [facility] to provide your oncology and hematology care.  To afford each patient quality time with our providers, please arrive at least 15 minutes before your scheduled appointment.  You need to re-schedule your appointment if you arrive 10 or more minutes late.  We strive to give you quality time with our providers, and arriving late affects you and other patients whose appointments are after yours.  Also, if you no show three or more times for appointments you may be dismissed from the clinic.  Again, thank you for choosing [place] at [place] hope is that these requests will allow you access to exceptional care and in a timely manner. _______________________________________________________________  If you have questions after your visit, please contact our office at [phone] between the hours of 8:30 a.m. and 5:00 p.m. Voicemails left after 4:30 p.m. will not be returned until the following business day. _______________________________________________________________  For prescription refill requests, have your pharmacy contact our office. _______________________________________________________________  Recommendations made by the consultant and any test results will be sent to your referring physician. _______________________________________________________________

## 2014-07-02 NOTE — Progress Notes (Signed)
Caroline Valdez Tolerated reclast infusion well today.  Discharged ambulatory.

## 2014-07-06 ENCOUNTER — Other Ambulatory Visit (HOSPITAL_COMMUNITY): Payer: Self-pay | Admitting: Hematology & Oncology

## 2014-07-06 DIAGNOSIS — C50912 Malignant neoplasm of unspecified site of left female breast: Secondary | ICD-10-CM

## 2014-07-14 ENCOUNTER — Encounter (HOSPITAL_COMMUNITY): Payer: Self-pay | Admitting: Hematology & Oncology

## 2014-07-14 DIAGNOSIS — M810 Age-related osteoporosis without current pathological fracture: Secondary | ICD-10-CM | POA: Insufficient documentation

## 2014-07-14 NOTE — Assessment & Plan Note (Signed)
She has osteoporosis. She takes calcium, vitamin D, and is on yearly re-class. She is currently doing fairly well and has no major complaints tolerating her calcium and vitamin D. She was encouraged to continue.

## 2014-07-14 NOTE — Assessment & Plan Note (Signed)
Von Willebrand's disease has been evaluated by Dr. Gaylyn Cheers at [facility]. She currently has that well-controlled she uses DDAVP when necessary. She is currently having no difficulties.

## 2014-07-14 NOTE — Assessment & Plan Note (Signed)
Pleasant 64 year old female DCIS of the left breast, questionable microinvasive disease. He was treated with mastectomy and reconstruction. Tumor was ER positive no endocrine therapy was pursued. She will be due again for screening mammogram of the right breast at the end of February and we will arrange this for her. Breast exam is unremarkable today. She is doing well without evidence of recurrence and we will see her back in one year.

## 2014-07-15 ENCOUNTER — Ambulatory Visit (HOSPITAL_COMMUNITY)
Admission: RE | Admit: 2014-07-15 | Discharge: 2014-07-15 | Disposition: A | Discharge status: Home / Self Care | Payer: Medicare Other | Source: Ambulatory Visit | Attending: Family Medicine | Admitting: Family Medicine

## 2014-07-15 ENCOUNTER — Ambulatory Visit
Admission: RE | Admit: 2014-07-15 | Discharge: 2014-07-15 | Disposition: A | Discharge status: Home / Self Care | Payer: Medicare Other | Source: Ambulatory Visit | Attending: Hematology & Oncology | Admitting: Hematology & Oncology

## 2014-07-15 ENCOUNTER — Other Ambulatory Visit (HOSPITAL_COMMUNITY): Payer: Self-pay | Admitting: Hematology & Oncology

## 2014-07-15 DIAGNOSIS — N644 Mastodynia: Secondary | ICD-10-CM | POA: Diagnosis not present

## 2014-07-15 DIAGNOSIS — Z09 Encounter for follow-up examination after completed treatment for conditions other than malignant neoplasm: Secondary | ICD-10-CM

## 2014-07-15 DIAGNOSIS — Z1231 Encounter for screening mammogram for malignant neoplasm of breast: Secondary | ICD-10-CM | POA: Insufficient documentation

## 2014-07-16 ENCOUNTER — Encounter (HOSPITAL_COMMUNITY): Payer: Self-pay

## 2014-07-16 DIAGNOSIS — C50919 Malignant neoplasm of unspecified site of unspecified female breast: Secondary | ICD-10-CM

## 2014-07-16 NOTE — Progress Notes (Unsigned)
STAR Program Physical Impairment and Functional Assessment Screening Tool  1. Are you having any pain, including headaches, joint pain, or muscle pain (upper body = OT; lower body = PT)?  Yes, this started after my diagnosis and is still a problem.  2. Do your hands and/or feet feel numb or tingle (PT)?  Yes, this is new since my last visit.  3. Does any part of your body feel swollen or larger than usual (upper body = OT; lower body = PT)?  No  4. Are you so tired that you cannot do the things you want or need to do (PT or OT)?  No  5. Are you feeling weak or are you having trouble moving any part of your body (PT/OT)?  Yes, this started after my diagnosis and is still a problem.  6. Are you having trouble concentrating, thinking, or remembering things (OT/ST)?  Yes, this started after my diagnosis and is still a problem.  7. Are you having trouble moving around or feel like you might trip or fall (PT)?  No  8. Are you having trouble swallowing (ST)?  No  9. Are you having trouble speaking (ST)?  No  10. Are you having trouble with going or getting to the bathroom (OT)?  No  11. Are you having trouble with your sexual function (OT)?  No  12. Are you having trouble lifting things, even just your arms (OT/PT)?  Yes, this started after my diagnosis and is still a problem.  37. Are you having trouble taking care of yourself as in dressing or bathing (OT)?  No  14. Are you having trouble with daily tasks like chores or shopping (OT)?  No  15. Are you having trouble driving (OT)?  No  16. Are you having trouble returning to work or completing your tasks at work (OT)?  No  Other concerns:  Having tenderness in left breast and arm and has difficulty moving arm.  Ultrasound and mammogram negative.  Legend: OT = Occupational Therapy PT = Physical Therapy ST = Speech Therapy

## 2014-07-20 ENCOUNTER — Other Ambulatory Visit (HOSPITAL_BASED_OUTPATIENT_CLINIC_OR_DEPARTMENT_OTHER): Payer: Medicare Other

## 2014-08-03 DIAGNOSIS — L570 Actinic keratosis: Secondary | ICD-10-CM | POA: Diagnosis not present

## 2014-08-03 DIAGNOSIS — Z85828 Personal history of other malignant neoplasm of skin: Secondary | ICD-10-CM | POA: Diagnosis not present

## 2014-08-10 ENCOUNTER — Ambulatory Visit (HOSPITAL_COMMUNITY): Payer: Medicare Other | Attending: Hematology & Oncology | Admitting: Physical Therapy

## 2014-08-10 DIAGNOSIS — M898X1 Other specified disorders of bone, shoulder: Secondary | ICD-10-CM | POA: Insufficient documentation

## 2014-08-10 DIAGNOSIS — N644 Mastodynia: Secondary | ICD-10-CM | POA: Diagnosis not present

## 2014-08-10 DIAGNOSIS — M25612 Stiffness of left shoulder, not elsewhere classified: Secondary | ICD-10-CM | POA: Diagnosis not present

## 2014-08-10 DIAGNOSIS — G8929 Other chronic pain: Secondary | ICD-10-CM | POA: Insufficient documentation

## 2014-08-10 NOTE — Therapy (Addendum)
[place], [place], [address] Phone: [phone]   Fax:  [phone]  Physical Therapy Evaluation  Patient Details  Name: Caroline Valdez MRN: [id number] Date of Birth: [birth year] Referring Provider:  Patrici Ranks, MD  Encounter Date: 08/10/2014      PT End of Session - 08/10/14 1250    Visit Number 1   Number of Visits 8   Date for PT Re-Evaluation 10/09/14   Authorization Type medicare   Authorization - Visit Number 1   Authorization - Number of Visits 8   PT Start Time 4008   PT Stop Time 1055   PT Time Calculation (min) 40 min   Activity Tolerance Patient tolerated treatment well   Behavior During Therapy [facility] for tasks assessed/performed      Past Medical History  Diagnosis Date  . Fibromyalgia   . 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 Endometriosis   . Radiculopathy   . GERD (gastroesophageal reflux disease)   . Von Willebrand's disease     history of  . Hypercholesteremia   . Breast cancer     left  . Mini stroke 09/2012  . Von Willebrand disease 09/11/2013    Past Surgical History  Procedure Laterality Date  . Mastectomy      left  . Hemorrhoid surgery    . Abdominal hysterectomy    . Tonsillectomy    . Shoulder surgery    . Back surgery    . Laminectomy    . Carpal tunnel release      right  . Breast reconstruction      There were no vitals filed for this visit.  Visit Diagnosis:  Chronic scapular pain  Breast pain, left  Stiffness of shoulder joint, left      Subjective Assessment - 08/10/14 1252    Symptoms Caroline Valdez,  Caroline Valdez states that she had a masectomy in November of 2010.  She did not opt for chemo or radiation.  In January she noticed that she was having increased pain in her shoulder blade area.  She has tried moving it around, and steroids but she is still having pain therefore she is being referred to therapy.  She states she has not noticed any  swelling.    Pertinent History Pleasant 64 year old female DCIS of the left breast, questionable microinvasive disease. Sge was treated with mastectomy and reconstruction. Tumor was ER positive no endocrine therapy was pursued. Hx: fibromyalgia, back surgery    How long can you sit comfortably? no problem   How long can you stand comfortably? no problem   How long can you walk comfortably? no problem   Currently in Pain? Yes   Pain Score 5    Pain Location Scapula   Pain Orientation Left   Pain Descriptors / Indicators Aching            [facility] PT Assessment - 08/10/14 0001    Assessment   Medical Diagnosis Lt scapular pain    Onset Date 05/25/14   Next MD Visit 07/01/2015   Prior Therapy none   Precautions   Precautions None   Required Braces or Orthoses --  none   Restrictions   Weight Bearing Restrictions No   Balance Screen   Has the patient fallen in the past 6 months No   Has the patient had a decrease in activity level because of a fear of falling?  No   Is the  patient reluctant to leave their home because of a fear of falling?  No   Home Environment   Living Enviornment Private residence   Type of Home House   Prior Function   Level of Independence Independent with basic ADLs   Vocation Retired   Leisure yard work; Occupational hygienist   Overall Cognitive Status Within [employer] for tasks assessed   Observation/Other Assessments   Focus on Therapeutic Outcomes (FOTO)  TUG- 8 seconds; FACIT-F125.8; VAS fatigue 2.3; VAS distress 1.0; VAS pain2.3    AROM   Left Shoulder Flexion 175 Degrees  tight at end range    Left Shoulder ABduction 155 Degrees   Left Shoulder Internal Rotation 90 Degrees   Left Shoulder External Rotation 90 Degrees   Strength   Left Shoulder Flexion 5/5   Left Shoulder Extension 3+/5   Left Shoulder ABduction 5/5   Left Shoulder Internal Rotation 4/5   Left Shoulder External Rotation 5/5   Palpation   Palpation pt has fascial  restriction and mm spasm along medial border of Lt scapula               [facility] Adult PT Treatment/Exercise - 08/10/14 0001    Exercises   Exercises Shoulder   Shoulder Exercises: Supine   External Rotation --   Internal Rotation --   Flexion AAROM;Both;10 reps   ABduction AAROM;Left;5 reps   Shoulder Exercises: Prone   Extension Left;5 reps   Horizontal ABduction 1 Left;10 reps           PT Education - 08/10/14 1231    Education provided Yes   Education Details HEP for improved ROM; Star booklet given out; information on breast cancer and lymphedma   Person(s) Educated Patient   Methods Explanation;Handout   Comprehension Verbalized understanding;Returned demonstration          PT Short Term Goals - 08/10/14 1241    PT SHORT TERM GOAL #1   Title I HEP   Time 2   Period Days   PT SHORT TERM GOAL #2   Title Pt to have full ROM to be able to place items in higher shelves   Time 2   Period Weeks           PT Long Term Goals - 08/10/14 1243    PT LONG TERM GOAL #1   Title I in advance HEP   Time 4   Period Weeks   PT LONG TERM GOAL #2   Title Pain level to be no higher than a 1/10 80% of the day    Time 4   Period Weeks   PT LONG TERM GOAL #3   Title no mm spasm to allow pt to have decreased pain   Time 4   Period Weeks               Plan - 08/10/14 1232    Clinical Impression Statement PT is a 64 yo female who has been referred to physical therapy secondary to having occasional  Lt breast and consistant Lt scapular pain.  She was diagnosed with Lt breast cancer in 2010; opted for a masectomy but no chemo or radiation therapy.  Examination demonstrates decreased ROM of Lt shoulder, decreased scapular stabilization, mm spasm along medial border of Lt scapula and increased pain.  Caroline Valdez will benefit from skilled PT to address these issues and decrease her pain to improve her quality of life.    Pt will benefit from  skilled therapeutic  intervention in order to improve on the following deficits Decreased range of motion;Increased fascial restricitons;Increased muscle spasms;Pain   Rehab Potential Good   PT Frequency 2x / week   PT Duration 4 weeks   PT Treatment/Interventions Therapeutic activities;Therapeutic exercise;Manual techniques   PT Next Visit Plan Begin manual techniques to decrease pain and spasm, UBE backward, begin dowel pendulum exercises as well as thumb tack exercises. Progress to higher activities such as wall push up, t-band postural exercises     PT Home Exercise Plan given          G-Codes - 2014/08/22 1246    Functional Assessment Tool Used clincial judgement   Functional Limitation Other PT primary   Other PT Primary Current Status (N1700) At least 1 percent but less than 20 percent impaired, limited or restricted   Other PT Primary Goal Status (F7494) 0 percent impaired, limited or restricted       Problem List Patient Active Problem List   Diagnosis Date Noted  . Osteoporosis 07/14/2014  . Von Willebrand disease 09/11/2013  . Hypercholesteremia   . Breast cancer   . Radiculopathy 11/08/2010  . Fibromyalgia 11/08/2010  . Endometriosis 11/08/2010    Rayetta Humphrey PT/ CLT [phone] 08-22-2014, 12:52 PM  [place] [address] [place], [place], [address] Phone: [phone]   Fax:  [phone]

## 2014-08-10 NOTE — Patient Instructions (Signed)
ROM: Flexion - Wand (Supine)   Lie on back holding wand. Raise arms over head.  Repeat __5-10__ times per set. Do ___1_ sets per session. Do __1-2__ sessions per day.  http://orth.exer.us/928   Copyright  VHI. All rights reserved.  ROM: Abduction - Wand   Holding wand with left hand palm up, push wand directly out to side, leading with other hand palm down, until stretch is felt. Hold __15-20__ seconds. Repeat __5__ times per set. Do __1__ sets per session. Do ___2_ sessions per day.  http://orth.exer.us/746   Copyright  VHI. All rights reserved.  ROM: Abduction - Wand   Holding wand with left hand palm up, push wand directly out to side, leading with other hand palm down, until stretch is felt. Hold ____ seconds. Repeat ____ times per set. Do ____ sets per session. Do ____ sessions per day.  http://orth.exer.us/746   Copyright  VHI. All rights reserved.  Scapular Retraction (Prone)   Lie with arms at sides. Pinch shoulder blades together and raise arms a few inches from floor. Repeat ___5-0_ times per set. Do _1___ sets per session. Do __2__ sessions per day.  http://orth.exer.us/954   Copyright  VHI. All rights reserved.  Strengthening: Horizontal Abduction - with External Rotation (Prone)   Holding __0__ pound weights, raise arms out from sides, pinching shoulder blades. Keep elbows straight, thumbs up. Repeat __5-10__ times per set. Do _1___ sets per session. Do _2___ sessions per day.  http://orth.exer.us/894   Copyright  VHI. All rights reserved.

## 2014-08-11 NOTE — Addendum Note (Signed)
Addended by: Leeroy Cha on: 08/11/2014 04:50 PM   Modules accepted: Orders

## 2014-08-19 ENCOUNTER — Ambulatory Visit (HOSPITAL_COMMUNITY): Payer: Medicare Other | Admitting: Physical Therapy

## 2014-08-19 DIAGNOSIS — M25612 Stiffness of left shoulder, not elsewhere classified: Secondary | ICD-10-CM

## 2014-08-19 DIAGNOSIS — N644 Mastodynia: Secondary | ICD-10-CM | POA: Diagnosis not present

## 2014-08-19 DIAGNOSIS — M898X1 Other specified disorders of bone, shoulder: Secondary | ICD-10-CM | POA: Diagnosis not present

## 2014-08-19 DIAGNOSIS — G8929 Other chronic pain: Secondary | ICD-10-CM | POA: Diagnosis not present

## 2014-08-19 NOTE — Therapy (Signed)
[place], [place], [address] Phone: [phone]   Fax:  [phone]  Physical Therapy Treatment  Patient Details  Name: Caroline Valdez MRN: [id number] Date of Birth: April 04, 1951 Referring Provider:  Susy Frizzle, MD  Encounter Date: 08/19/2014      PT End of Session - 08/19/14 1045    Visit Number 2   Number of Visits 8   Date for PT Re-Evaluation 10/09/14   Authorization Type medicare   Authorization - Visit Number 2   Authorization - Number of Visits 8   PT Start Time 0850   PT Stop Time 0930   PT Time Calculation (min) 40 min      Past Medical History  Diagnosis Date  . Fibromyalgia   . 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 Endometriosis   . Radiculopathy   . GERD (gastroesophageal reflux disease)   . Von Willebrand's disease     history of  . Hypercholesteremia   . Breast cancer     left  . Mini stroke 09/2012  . Von Willebrand disease 09/11/2013    Past Surgical History  Procedure Laterality Date  . Mastectomy      left  . Hemorrhoid surgery    . Abdominal hysterectomy    . Tonsillectomy    . Shoulder surgery    . Back surgery    . Laminectomy    . Carpal tunnel release      right  . Breast reconstruction      There were no vitals filed for this visit.  Visit Diagnosis:  Chronic scapular pain  Breast pain, left  Stiffness of shoulder joint, left      Subjective Assessment - 08/19/14 1039    Symptoms Pt states she is having increased breast tingling and an uncomfortable feeling under the scar on her Lt breast.     Currently in Pain? No/denies   Pain Score --  states it has been up to a 3/10 in the past two days.   Pain Location Scapula   Pain Orientation Left   Pain Descriptors / Indicators Aching   Multiple Pain Sites Yes   Pain Score 2   Pain Location Breast   Pain Orientation Left   Pain Descriptors / Indicators Tingling   Pain Onset 1 to 4 weeks  ago              [facility] Adult PT Treatment/Exercise - 08/19/14 0001    Shoulder Exercises: Supine   Other Supine Exercises PROM for shoulder now wnl    Shoulder Exercises: Seated   Other Seated Exercises Lt scapular retraction /cervical retraction x 10 each    Manual Therapy   Manual Therapy Myofascial release;Manual Lymphatic Drainage (MLD)   Myofascial Release Under scar area of Lt breast    Manual Lymphatic Drainage (MLD) for supraclavicular, deep and superficial abdominal; routing fluid using intraaxillary and Lt axillary/inguinal pathway.  UE was not completed                 PT Education - 08/19/14 1045    Education provided Yes   Education Details to complete scar massage for breast scar.   Person(s) Educated Patient   Methods Explanation;Demonstration   Comprehension Verbalized understanding;Returned demonstration          PT Short Term Goals - 08/10/14 1241    PT SHORT TERM GOAL #1   Title I HEP   Time 2  Period Days   PT SHORT TERM GOAL #2   Title Pt to have full ROM to be able to place items in higher shelves   Time 2   Period Weeks           PT Long Term Goals - 08/10/14 1243    PT LONG TERM GOAL #1   Title I in advance HEP   Time 4   Period Weeks   PT LONG TERM GOAL #2   Title Pain level to be no higher than a 1/10 80% of the day    Time 4   Period Weeks   PT LONG TERM GOAL #3   Title no mm spasm to allow pt to have decreased pain   Time 4   Period Weeks               Plan - 08/19/14 1046    Clinical Impression Statement Pt states that the exercises on her stomach increased her breast discomfort.  She had called the facility and therapist told pt to only do supine exercises which seemed to help.  Pt has increased swelling in Lt breast.  Pt also has scar adhesions under Lt breast incision.  Treatment will focus on manual to decrease swelling and discomfort as ROM is wnl now.    PT Next Visit Plan Pt to have increased time  spent in reducting Lt breast edema and scar adhesions.  Begin dowel pendulem exercises for stretching.         Problem List Patient Active Problem List   Diagnosis Date Noted  . Osteoporosis 07/14/2014  . Von Willebrand disease 09/11/2013  . Hypercholesteremia   . Breast cancer   . Radiculopathy 11/08/2010  . Fibromyalgia 11/08/2010  . Endometriosis 11/08/2010   Rayetta Humphrey, PT CLT [phone] 08/19/2014, 10:50 AM  [place], [place], [address] Phone: [phone]   Fax:  [phone]

## 2014-08-25 ENCOUNTER — Ambulatory Visit (HOSPITAL_COMMUNITY): Payer: Medicare Other | Admitting: Physical Therapy

## 2014-08-26 ENCOUNTER — Ambulatory Visit (HOSPITAL_COMMUNITY): Payer: Medicare Other | Attending: Hematology & Oncology | Admitting: Physical Therapy

## 2014-08-26 DIAGNOSIS — G8929 Other chronic pain: Secondary | ICD-10-CM | POA: Insufficient documentation

## 2014-08-26 DIAGNOSIS — M898X1 Other specified disorders of bone, shoulder: Secondary | ICD-10-CM | POA: Insufficient documentation

## 2014-08-26 DIAGNOSIS — M25612 Stiffness of left shoulder, not elsewhere classified: Secondary | ICD-10-CM | POA: Diagnosis not present

## 2014-08-26 DIAGNOSIS — N644 Mastodynia: Secondary | ICD-10-CM | POA: Diagnosis not present

## 2014-08-26 NOTE — Therapy (Signed)
[place], [place], [address] Phone: [phone]   Fax:  [phone]  Physical Therapy Treatment  Patient Details  Name: Caroline Valdez MRN: [id number] Date of Birth: 1950-08-06 Referring Provider:  Patrici Ranks, MD  Encounter Date: 08/26/2014      PT End of Session - 08/26/14 1214    Visit Number 3   Number of Visits 8   Date for PT Re-Evaluation 10/09/14   Authorization Type medicare   Authorization - Visit Number 3   Authorization - Number of Visits 8   PT Start Time 0945   PT Stop Time 1025   PT Time Calculation (min) 40 min   Activity Tolerance Patient tolerated treatment well   Behavior During Therapy [facility] for tasks assessed/performed      Past Medical History  Diagnosis Date  . Fibromyalgia   . 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 Endometriosis   . Radiculopathy   . GERD (gastroesophageal reflux disease)   . Von Willebrand's disease     history of  . Hypercholesteremia   . Breast cancer     left  . Mini stroke 09/2012  . Von Willebrand disease 09/11/2013    Past Surgical History  Procedure Laterality Date  . Mastectomy      left  . Hemorrhoid surgery    . Abdominal hysterectomy    . Tonsillectomy    . Shoulder surgery    . Back surgery    . Laminectomy    . Carpal tunnel release      right  . Breast reconstruction      There were no vitals filed for this visit.  Visit Diagnosis:  Chronic scapular pain  Breast pain, left  Stiffness of shoulder joint, left      Subjective Assessment - 08/26/14 1219    Subjective PT states she is no longer having discomfort in her scar and her scapular region is not bothering her.  States she is currently without pain.   Currently in Pain? No/denies                       [facility] Adult PT Treatment/Exercise - 08/26/14 1213    Shoulder Exercises: Seated   Elevation Both;10 reps   Retraction 10 reps   Shoulder Exercises: Sidelying   ABduction 10 reps   Manual Therapy   Manual Therapy Manual Lymphatic Drainage (MLD)   Myofascial Release to Lt pectoral muscle to decrease tightness/adhesions   Manual Lymphatic Drainage (MLD) for Lt breast routing Lt inguinal and Rt axillary including deep and superficial abdominal, clavical and lumbar nodes.  Completed supine and Rt sidelying. l                  PT Short Term Goals - 08/10/14 1241    PT SHORT TERM GOAL #1   Title I HEP   Time 2   Period Days   PT SHORT TERM GOAL #2   Title Pt to have full ROM to be able to place items in higher shelves   Time 2   Period Weeks           PT Long Term Goals - 08/10/14 1243    PT LONG TERM GOAL #1   Title I in advance HEP   Time 4   Period Weeks   PT LONG TERM GOAL #2   Title Pain level to be no higher than  a 1/10 80% of the day    Time 4   Period Weeks   PT LONG TERM GOAL #3   Title no mm spasm to allow pt to have decreased pain   Time 4   Period Weeks               Plan - 08/26/14 1215    Clinical Impression Statement Pt with full ROM in Lt UE now; however end range limitation with abduction with noted tightness in pectoral muscle.  Instructed with wall stretch for HEP and added seated UE strengtheing exercises.  PT able to complete without difficulty.  Pt no longer with tightness and sensitivity in scar region with overall reduction in swelling in Lt breast.  Pt educated on compression tank tops and how to acquire one.  Also informed of specialty bras and to notify her insurance to find out if these are covered.    PT Next Visit Plan continue manual techniques to decrease swelling in Lt breast.  Instruct with pectoral stretch next visit.  Follow up with compression garment/bra.        Problem List Patient Active Problem List   Diagnosis Date Noted  . Osteoporosis 07/14/2014  . Von Willebrand disease 09/11/2013  . Hypercholesteremia   . Breast cancer   .  Radiculopathy 11/08/2010  . Fibromyalgia 11/08/2010  . Endometriosis 11/08/2010    Teena Irani, PTA/CLT [phone] 08/26/2014, 12:21 PM  [place] [address] [place], [place], [address] Phone: [phone]   Fax:  [phone]

## 2014-08-27 ENCOUNTER — Ambulatory Visit (HOSPITAL_COMMUNITY): Payer: Medicare Other | Admitting: Physical Therapy

## 2014-08-27 DIAGNOSIS — M25612 Stiffness of left shoulder, not elsewhere classified: Secondary | ICD-10-CM | POA: Diagnosis not present

## 2014-08-27 DIAGNOSIS — M898X1 Other specified disorders of bone, shoulder: Secondary | ICD-10-CM | POA: Diagnosis not present

## 2014-08-27 DIAGNOSIS — N644 Mastodynia: Secondary | ICD-10-CM

## 2014-08-27 DIAGNOSIS — G8929 Other chronic pain: Secondary | ICD-10-CM

## 2014-08-28 NOTE — Therapy (Signed)
[place], [place], [address] Phone: [phone]   Fax:  [phone]  Physical Therapy Treatment  Patient Details  Name: Caroline Valdez MRN: [id number] Date of Birth: 09/07/1950 Referring Provider:  Patrici Ranks, MD  Encounter Date: 08/27/2014      PT End of Session - 08/27/14 1052    Visit Number 4   Number of Visits 8   Date for PT Re-Evaluation 10/09/14   Authorization Type medicare   Authorization - Visit Number 4   Authorization - Number of Visits 8   PT Start Time 9678   PT Stop Time 0937   PT Time Calculation (min) 45 min      Past Medical History  Diagnosis Date  . Fibromyalgia   . 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 Endometriosis   . Radiculopathy   . GERD (gastroesophageal reflux disease)   . Von Willebrand's disease     history of  . Hypercholesteremia   . Breast cancer     left  . Mini stroke 09/2012  . Von Willebrand disease 09/11/2013    Past Surgical History  Procedure Laterality Date  . Mastectomy      left  . Hemorrhoid surgery    . Abdominal hysterectomy    . Tonsillectomy    . Shoulder surgery    . Back surgery    . Laminectomy    . Carpal tunnel release      right  . Breast reconstruction      There were no vitals filed for this visit.  Visit Diagnosis:  Chronic scapular pain  Breast pain, left  Stiffness of shoulder joint, left      Subjective Assessment - 08/28/14 1042    Subjective Pt continues to be pain free.  States that she has all her ROM back in her arm.              [facility] Adult PT Treatment/Exercise - 08/28/14 0001    Shoulder Exercises: Seated   Elevation Both;10 reps   Retraction 10 reps   Shoulder Exercises: Sidelying   ABduction 10 reps   Shoulder Exercises: Standing   Other Standing Exercises 3 way pectorial stretch    Manual Therapy   Manual Therapy Manual Lymphatic Drainage (MLD)   Myofascial Release to Lt  pectoral muscle to decrease tightness/adhesions   Manual Lymphatic Drainage (MLD) for Lt breast routing Lt inguinal and Rt axillary including deep and superficial abdominal, clavical and lumbar nodes.  Completed supine and  prone            PT Education - 08/28/14 1051    Education provided Yes   Education Details Pectorial stretch   Methods Explanation;Demonstration   Comprehension Verbalized understanding;Returned demonstration          PT Short Term Goals - 08/10/14 1241    PT SHORT TERM GOAL #1   Title I HEP   Time 2   Period Days   PT SHORT TERM GOAL #2   Title Pt to have full ROM to be able to place items in higher shelves   Time 2   Period Weeks           PT Long Term Goals - 08/10/14 1243    PT LONG TERM GOAL #1   Title I in advance HEP   Time 4   Period Weeks   PT LONG TERM GOAL #2   Title Pain level  to be no higher than a 1/10 80% of the day    Time 4   Period Weeks   PT LONG TERM GOAL #3   Title no mm spasm to allow pt to have decreased pain   Time 4   Period Weeks               Plan - 08/27/14 1053    Clinical Impression Statement PT with decreased induration but continued swelling  in Lt breast and subaxillary area. Pt  states her insurance company stated that they would cover what was medically necessary.  Will send a prescription for compression bra to her insurance.    PT Next Visit Plan Continue manual techniques to decrease swelling in Lt breast and subaxillary area.          Problem List Patient Active Problem List   Diagnosis Date Noted  . Osteoporosis 07/14/2014  . Von Willebrand disease 09/11/2013  . Hypercholesteremia   . Breast cancer   . Radiculopathy 11/08/2010  . Fibromyalgia 11/08/2010  . Endometriosis 11/08/2010   Rayetta Humphrey, PT CLT [phone] 08/28/2014, 10:57 AM  [place], [place], [address] Phone: [phone]   Fax:   [phone]

## 2014-08-31 ENCOUNTER — Ambulatory Visit (HOSPITAL_COMMUNITY): Payer: Medicare Other | Admitting: Physical Therapy

## 2014-08-31 DIAGNOSIS — G8929 Other chronic pain: Secondary | ICD-10-CM

## 2014-08-31 DIAGNOSIS — N644 Mastodynia: Secondary | ICD-10-CM

## 2014-08-31 DIAGNOSIS — M25612 Stiffness of left shoulder, not elsewhere classified: Secondary | ICD-10-CM | POA: Diagnosis not present

## 2014-08-31 DIAGNOSIS — M898X1 Other specified disorders of bone, shoulder: Principal | ICD-10-CM

## 2014-08-31 NOTE — Therapy (Signed)
[place], [place], [address] Phone: [phone]   Fax:  [phone]  Physical Therapy Treatment  Patient Details  Name: Caroline Valdez MRN: [id number] Date of Birth: 1950-09-26 Referring Provider:  Patrici Ranks, MD  Encounter Date: 08/31/2014      PT End of Session - 08/31/14 1026    Visit Number 5   Number of Visits 8   Date for PT Re-Evaluation 10/09/14   Authorization Type medicare   Authorization - Visit Number 5   Authorization - Number of Visits 8   PT Start Time 0931   PT Stop Time 1009   PT Time Calculation (min) 38 min   Activity Tolerance Patient tolerated treatment well   Behavior During Therapy [facility] for tasks assessed/performed      Past Medical History  Diagnosis Date  . Fibromyalgia   . 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 Endometriosis   . Radiculopathy   . GERD (gastroesophageal reflux disease)   . Von Willebrand's disease     history of  . Hypercholesteremia   . Breast cancer     left  . Mini stroke 09/2012  . Von Willebrand disease 09/11/2013    Past Surgical History  Procedure Laterality Date  . Mastectomy      left  . Hemorrhoid surgery    . Abdominal hysterectomy    . Tonsillectomy    . Shoulder surgery    . Back surgery    . Laminectomy    . Carpal tunnel release      right  . Breast reconstruction      There were no vitals filed for this visit.  Visit Diagnosis:  Chronic scapular pain  Breast pain, left  Stiffness of shoulder joint, left      Subjective Assessment - 08/31/14 0955    Subjective Patient states she is having some discomfort along her scar line and part of her pec muscle, which started after the pec stretching last session   Pertinent History Pleasant 64 year old female DCIS of the left breast, questionable microinvasive disease. Sge was treated with mastectomy and reconstruction. Tumor was ER positive no endocrine therapy  was pursued. Hx: fibromyalgia, back surgery    Currently in Pain? Yes   Pain Score 3    Pain Location --  L pec/along scar line   Pain Orientation Left                       [facility] Adult PT Treatment/Exercise - 08/31/14 0001    Shoulder Exercises: Seated   Elevation Both;15 reps   Retraction 15 reps   Flexion Left;15 reps   Flexion Weight (lbs) 0   Flexion Limitations cues for form   Abduction Left;AAROM;15 reps   ABduction Weight (lbs) 0   ABduction Limitations with dowel, cues for form   Other Seated Exercises Posterior and anterior shoulder rolls 1x15 at slow, easy pace    Other Seated Exercises 3D thoracic excursions 1x10   Manual Therapy   Manual Therapy Manual Lymphatic Drainage (MLD);Myofascial release   Myofascial Release to L pec and L anterior deltoid    Manual Lymphatic Drainage (MLD) performed to L shoulder girdle/pectoral girdle in supine/prone positions                 PT Education - 08/31/14 1026    Education provided Yes   Education Details education regarding close interactions of neck/shoulder/thoracic spine  Person(s) Educated Patient   Methods Explanation   Comprehension Verbalized understanding          PT Short Term Goals - 08/10/14 1241    PT SHORT TERM GOAL #1   Title I HEP   Time 2   Period Days   PT SHORT TERM GOAL #2   Title Pt to have full ROM to be able to place items in higher shelves   Time 2   Period Weeks           PT Long Term Goals - 08/10/14 1243    PT LONG TERM GOAL #1   Title I in advance HEP   Time 4   Period Weeks   PT LONG TERM GOAL #2   Title Pain level to be no higher than a 1/10 80% of the day    Time 4   Period Weeks   PT LONG TERM GOAL #3   Title no mm spasm to allow pt to have decreased pain   Time 4   Period Weeks               Plan - 08/31/14 1027    Clinical Impression Statement Continued manual work including release of muscle knots in pectoral/axillary region and in  anterior deltiod; also continued shoudler girdle/scapular exercises with focus on slow, controlled form with appropriate pacing and good technique. Patient did experience reduction in soreness in area of scar after treatment but at end of session did state her shoulder was a little sore after increasing repetitions of exercise, and that that shoulder had been sensitive to high amounts of activity for a long time due to some problems she has in her neck. Recommend  adjusting reps appropriately next session.    Pt will benefit from skilled therapeutic intervention in order to improve on the following deficits Decreased range of motion;Increased fascial restricitons;Increased muscle spasms;Pain   Rehab Potential Good   PT Frequency 2x / week   PT Duration 4 weeks   PT Treatment/Interventions Therapeutic activities;Therapeutic exercise;Manual techniques   PT Next Visit Plan Continue manual techniques to decrease swelling in Lt breast and subaxillary area.     PT Home Exercise Plan given   Consulted and Agree with Plan of Care Patient        Problem List Patient Active Problem List   Diagnosis Date Noted  . Osteoporosis 07/14/2014  . Von Willebrand disease 09/11/2013  . Hypercholesteremia   . Breast cancer   . Radiculopathy 11/08/2010  . Fibromyalgia 11/08/2010  . Endometriosis 11/08/2010    Deniece Ree PT, DPT [phone]  [place] [address] [place], [place], [address] Phone: [phone]   Fax:  [phone]

## 2014-09-02 ENCOUNTER — Ambulatory Visit (HOSPITAL_COMMUNITY): Admitting: Physical Therapy

## 2014-09-02 DIAGNOSIS — H3530 Unspecified macular degeneration: Secondary | ICD-10-CM | POA: Diagnosis not present

## 2014-09-02 DIAGNOSIS — H5211 Myopia, right eye: Secondary | ICD-10-CM | POA: Diagnosis not present

## 2014-09-02 DIAGNOSIS — H5202 Hypermetropia, left eye: Secondary | ICD-10-CM | POA: Diagnosis not present

## 2014-09-02 DIAGNOSIS — H52221 Regular astigmatism, right eye: Secondary | ICD-10-CM | POA: Diagnosis not present

## 2014-09-03 ENCOUNTER — Ambulatory Visit (HOSPITAL_COMMUNITY): Payer: Medicare Other | Admitting: Physical Therapy

## 2014-09-03 DIAGNOSIS — M898X1 Other specified disorders of bone, shoulder: Principal | ICD-10-CM

## 2014-09-03 DIAGNOSIS — N644 Mastodynia: Secondary | ICD-10-CM | POA: Diagnosis not present

## 2014-09-03 DIAGNOSIS — M25612 Stiffness of left shoulder, not elsewhere classified: Secondary | ICD-10-CM

## 2014-09-03 DIAGNOSIS — G8929 Other chronic pain: Secondary | ICD-10-CM

## 2014-09-03 NOTE — Therapy (Addendum)
[place], [place], [address] Phone: [phone]   Fax:  [phone]  Physical Therapy Treatment  Patient Details  Name: Caroline Valdez MRN: [id number] Date of Birth: 19-Jun-1950 Referring Provider:  Patrici Ranks, MD  Encounter Date: 09/03/2014      PT End of Session - 09/03/14 1218    Visit Number 6   Number of Visits 8   Date for PT Re-Evaluation 10/09/14   Authorization Type medicare   Authorization - Visit Number 6   Authorization - Number of Visits 8   PT Start Time 3491   PT Stop Time 0936   PT Time Calculation (min) 46 min      Past Medical History  Diagnosis Date  . Fibromyalgia   . 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 Endometriosis   . Radiculopathy   . GERD (gastroesophageal reflux disease)   . Von Willebrand's disease     history of  . Hypercholesteremia   . Breast cancer     left  . Mini stroke 09/2012  . Von Willebrand disease 09/11/2013    Past Surgical History  Procedure Laterality Date  . Mastectomy      left  . Hemorrhoid surgery    . Abdominal hysterectomy    . Tonsillectomy    . Shoulder surgery    . Back surgery    . Laminectomy    . Carpal tunnel release      right  . Breast reconstruction      There were no vitals filed for this visit.  Visit Diagnosis:  Chronic scapular pain  Breast pain, left  Stiffness of shoulder joint, left      Subjective Assessment - 09/03/14 0855    Subjective Pt states she had increased pain iin her scapular area and all the way into her neck after last session.  Pain has decreased now but is still there.  States she has increased pain whenever she tries to do anything but she has bulge discs in her neck and that may be the reason.     Currently in Pain? Yes   Pain Score 3    Pain Location Scapula   Pain Orientation Left              [facility] Adult PT Treatment/Exercise - 09/03/14 0001    Manual Therapy    Manual Therapy Manual Lymphatic Drainage (MLD)   Myofascial Release to Lt pectoral muscle to decrease tightness/adhesions   Manual Lymphatic Drainage (MLD) for Lt breast routing Lt inguinal and Rt axillary including deep and superficial abdominal, clavical and lumbar nodes.  Completed supine and  prone     Cervical extension and SB x 5 reps each.            PT Education - 09/03/14 1218    Education provided Yes   Education Details cervical extension and sb   Person(s) Educated Patient   Methods Explanation;Demonstration   Comprehension Verbalized understanding;Returned demonstration          PT Short Term Goals - 08/10/14 1241    PT SHORT TERM GOAL #1   Title I HEP   Time 2   Period Days   PT SHORT TERM GOAL #2   Title Pt to have full ROM to be able to place items in higher shelves   Time 2   Period Weeks           PT Long  Term Goals - 08/10/14 1243    PT LONG TERM GOAL #1   Title I in advance HEP   Time 4   Period Weeks   PT LONG TERM GOAL #2   Title Pain level to be no higher than a 1/10 80% of the day    Time 4   Period Weeks   PT LONG TERM GOAL #3   Title no mm spasm to allow pt to have decreased pain   Time 4   Period Weeks               Plan - 09/03/14 1219    Clinical Impression Statement Pt given presciption for compression bra and pamphlet for possible vendor.  Pt only completed cervical extension and SB due to pt being very sensitive to exercise.  Pt tends to have increased swelling and pain when completing more than five minutes of exercise.  Therapis stressed to pt not to stop but to work exercises slowly into program.       PT Next Visit Plan continue manual complete three reps only of shoulder flexion and extension and scapular retraction.  Have pt add this to cervical ROM exercises    Consulted and Agree with Plan of Care Patient        Problem List Patient Active Problem List   Diagnosis Date Noted  . Osteoporosis 07/14/2014   . Von Willebrand disease 09/11/2013  . Hypercholesteremia   . Breast cancer   . Radiculopathy 11/08/2010  . Fibromyalgia 11/08/2010  . Endometriosis 11/08/2010   Rayetta Humphrey, PT CLT [phone] 09/03/2014, 12:23 PM  [place], [place], [address] Phone: [phone]   Fax:  [phone]     PHYSICAL THERAPY DISCHARGE SUMMARY  Visits from Start of Care:6  Current functional level related to goals / functional outcomes: Unknown pt quit coming to sessions   Remaining deficits: unknown   Education / Equipment: HEP  Plan: Patient agrees to discharge.  Patient goals were not met. Patient is being discharged due to not returning since the last visit.  ?????       Rayetta Humphrey, [place] CLT [phone]

## 2014-09-07 ENCOUNTER — Ambulatory Visit (HOSPITAL_COMMUNITY): Admitting: Physical Therapy

## 2014-09-09 ENCOUNTER — Ambulatory Visit (HOSPITAL_COMMUNITY): Admitting: Physical Therapy

## 2014-09-14 ENCOUNTER — Ambulatory Visit (HOSPITAL_COMMUNITY): Admitting: Physical Therapy

## 2014-09-16 ENCOUNTER — Ambulatory Visit (HOSPITAL_COMMUNITY): Payer: Medicare Other | Admitting: Physical Therapy

## 2014-10-22 ENCOUNTER — Ambulatory Visit (INDEPENDENT_AMBULATORY_CARE_PROVIDER_SITE_OTHER): Payer: Medicare Other | Admitting: Family Medicine

## 2014-10-22 ENCOUNTER — Encounter: Payer: Self-pay | Admitting: Family Medicine

## 2014-10-22 VITALS — BP 110/80 | HR 80 | Temp 98.3°F | Resp 18 | Wt 134.0 lb

## 2014-10-22 DIAGNOSIS — G459 Transient cerebral ischemic attack, unspecified: Secondary | ICD-10-CM | POA: Diagnosis not present

## 2014-10-22 DIAGNOSIS — Z23 Encounter for immunization: Secondary | ICD-10-CM

## 2014-10-22 DIAGNOSIS — E785 Hyperlipidemia, unspecified: Secondary | ICD-10-CM

## 2014-10-22 LAB — COMPLETE METABOLIC PANEL WITH GFR
ALBUMIN: 4.2 g/dL (ref 3.5–5.2)
ALT: 19 U/L (ref 0–35)
AST: 25 U/L (ref 0–37)
Alkaline Phosphatase: 49 U/L (ref 39–117)
BILIRUBIN TOTAL: 0.8 mg/dL (ref 0.2–1.2)
BUN: 13 mg/dL (ref 6–23)
CALCIUM: 8.9 mg/dL (ref 8.4–10.5)
CHLORIDE: 105 meq/L (ref 96–112)
CO2: 29 mEq/L (ref 19–32)
Creat: 0.7 mg/dL (ref 0.50–1.10)
GFR, Est African American: >89 mL/min
GFR, Est Non African American: >89 mL/min
GLUCOSE: 82 mg/dL (ref 70–99)
Potassium: 4.3 mEq/L (ref 3.5–5.3)
SODIUM: 142 meq/L (ref 135–145)
TOTAL PROTEIN: 6.4 g/dL (ref 6.0–8.3)

## 2014-10-22 LAB — CBC WITH DIFFERENTIAL/PLATELET
Basophils Absolute: 0 10*3/uL (ref 0.0–0.1)
Basophils Relative: 0 % (ref 0–1)
EOS ABS: 0.1 10*3/uL (ref 0.0–0.7)
Eosinophils Relative: 3 % (ref 0–5)
HEMATOCRIT: 41.5 % (ref 36.0–46.0)
Hemoglobin: 13.9 g/dL (ref 12.0–15.0)
LYMPHS PCT: 29 % (ref 12–46)
Lymphs Abs: 1.2 10*3/uL (ref 0.7–4.0)
MCH: 31.1 pg (ref 26.0–34.0)
MCHC: 33.5 g/dL (ref 30.0–36.0)
MCV: 92.8 fL (ref 78.0–100.0)
MONOS PCT: 11 % (ref 3–12)
MPV: 10.3 fL (ref 8.6–12.4)
Monocytes Absolute: 0.5 10*3/uL (ref 0.1–1.0)
Neutro Abs: 2.4 10*3/uL (ref 1.7–7.7)
Neutrophils Relative %: 57 % (ref 43–77)
PLATELETS: 178 10*3/uL (ref 150–400)
RBC: 4.47 MIL/uL (ref 3.87–5.11)
RDW: 13.1 % (ref 11.5–15.5)
WBC: 4.2 10*3/uL (ref 4.0–10.5)

## 2014-10-22 LAB — LIPID PANEL
CHOLESTEROL: 133 mg/dL (ref 0–200)
HDL: 56 mg/dL (ref >=46)
LDL CALC: 58 mg/dL (ref 0–99)
TRIGLYCERIDES: 97 mg/dL (ref <150)
Total CHOL/HDL Ratio: 2.4 Ratio
VLDL: 19 mg/dL (ref 0–40)

## 2014-10-22 NOTE — Progress Notes (Signed)
Subjective:    Patient ID: Caroline Valdez, female    DOB: 1950/11/12, 64 y.o.   MRN: [id number]  HPI  Patient has a history of a TIA as well as hyperlipidemia. She is currently on aspirin 81 mg by mouth daily for prevention of stroke. She is also taking atorvastatin 40 mg by mouth daily. She denies any myalgias or right upper quadrant pain. Her immunizations are up-to-date. She is overdue for a fasting lipid panel and CMP. Her mammogram is up-to-date. She has a history of a hysterectomy and therefore does not require Pap smears. Her last colonoscopy was in 2011.   She is due for prevnar 13 today.  She also has a trigger finger in her right 4th mcp joint. Past Medical History  Diagnosis Date  . Fibromyalgia   . 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 Endometriosis   . Radiculopathy   . GERD (gastroesophageal reflux disease)   . Von Willebrand's disease     history of  . Hypercholesteremia   . Breast cancer     left  . Mini stroke 09/2012  . Von Willebrand disease 09/11/2013   Past Surgical History  Procedure Laterality Date  . Mastectomy      left  . Hemorrhoid surgery    . Abdominal hysterectomy    . Tonsillectomy    . Shoulder surgery    . Back surgery    . Laminectomy    . Carpal tunnel release      right  . Breast reconstruction     Current Outpatient Prescriptions on File Prior to Visit  Medication Sig Dispense Refill  . ALPRAZolam (XANAX) 0.5 MG tablet Take 1 tablet (0.5 mg total) by mouth at bedtime as needed. 60 tablet 1  . aspirin 81 MG tablet Take 81 mg by mouth daily.    Marland Kitchen atorvastatin (LIPITOR) 40 MG tablet Take 1 tablet (40 mg total) by mouth daily. 90 tablet 3  . calcium carbonate (OS-CAL) 600 MG TABS Take 600 mg by mouth daily.     . cholecalciferol (VITAMIN D) 1000 UNITS tablet Take 1,000 Units by mouth daily.    Marland Kitchen esomeprazole (NEXIUM) 40 MG capsule Take 1 capsule (40 mg total) by mouth daily. 90 capsule 3  . Multiple Vitamins-Minerals (ICAPS MV  PO) Take 1 capsule by mouth daily.     Marland Kitchen zinc sulfate 220 MG capsule Take 220 mg by mouth daily.      . zoledronic acid (RECLAST) 5 MG/100ML SOLN Inject 5 mg into the vein once. yearly     No current facility-administered medications on file prior to visit.   No Known Allergies History   Social History  . Marital Status: Married    Spouse Name: N/A  . Number of Children: N/A  . Years of Education: N/A   Occupational History  . Not on file.   Social History Main Topics  . Smoking status: Never Smoker   . Smokeless tobacco: Never Used  . Alcohol Use: No  . Drug Use: No  . Sexual Activity: Not on file     Comment: husband recently died from myelodysplastic syndrome in January of 2014   Other Topics Concern  . Not on file   Social History Narrative      Review of Systems  All other systems reviewed and are negative.      Objective:   Physical Exam  Constitutional: She is oriented to person, place, and time. She appears well-developed  and well-nourished.  Neck: Neck supple. No JVD present.  Cardiovascular: Normal rate, regular rhythm and normal heart sounds.   No murmur heard. Pulmonary/Chest: Effort normal and breath sounds normal. No respiratory distress. She has no wheezes. She has no rales.  Abdominal: Soft. Bowel sounds are normal. She exhibits no distension. There is no tenderness. There is no rebound and no guarding.  Musculoskeletal: She exhibits no edema.  Neurological: She is alert and oriented to person, place, and time. She has normal reflexes. No cranial nerve deficit. She exhibits normal muscle tone. Coordination normal.  Vitals reviewed.         Assessment & Plan:  HLD (hyperlipidemia) - Plan: COMPLETE METABOLIC PANEL WITH GFR, Lipid panel, CBC with Differential/Platelet  Need for vaccination - Plan: Pneumococcal conjugate vaccine 13-valent  Transient cerebral ischemia, unspecified transient cerebral ischemia type  Blood pressure is excellent.  Her preventative care is up-to-date. I will check a CMP as well as fasting lipid panel to monitor her hyperlipidemia. Goal LDL cholesterol is less than 70.  Received prevnar 13.  Offered cortisone injection for trigger finger but she deferred today.

## 2015-01-25 DIAGNOSIS — Z85828 Personal history of other malignant neoplasm of skin: Secondary | ICD-10-CM | POA: Diagnosis not present

## 2015-01-25 DIAGNOSIS — L570 Actinic keratosis: Secondary | ICD-10-CM | POA: Diagnosis not present

## 2015-02-16 DIAGNOSIS — B078 Other viral warts: Secondary | ICD-10-CM | POA: Diagnosis not present

## 2015-02-16 DIAGNOSIS — C44529 Squamous cell carcinoma of skin of other part of trunk: Secondary | ICD-10-CM | POA: Diagnosis not present

## 2015-02-16 DIAGNOSIS — D485 Neoplasm of uncertain behavior of skin: Secondary | ICD-10-CM | POA: Diagnosis not present

## 2015-02-17 ENCOUNTER — Encounter (HOSPITAL_COMMUNITY): Payer: Self-pay

## 2015-02-17 ENCOUNTER — Encounter (HOSPITAL_COMMUNITY): Payer: Medicare Other | Attending: Hematology & Oncology

## 2015-02-17 DIAGNOSIS — Z23 Encounter for immunization: Secondary | ICD-10-CM | POA: Diagnosis present

## 2015-02-17 MED ORDER — INFLUENZA VAC SPLIT QUAD 0.5 ML IM SUSY
0.5000 mL | PREFILLED_SYRINGE | Freq: Once | INTRAMUSCULAR | Status: AC
  Administered 2015-02-17: 0.5 mL via INTRAMUSCULAR

## 2015-02-17 MED ORDER — INFLUENZA VAC SPLIT QUAD 0.5 ML IM SUSY
PREFILLED_SYRINGE | INTRAMUSCULAR | Status: AC
  Filled 2015-02-17: qty 0.5

## 2015-02-17 NOTE — Progress Notes (Signed)
Caroline Valdez's reason for visit today is for an injection and labs as scheduled per MD orders. Caroline Valdez also received flu injection per MD orders; see [facility] for administration details.  Caroline Valdez tolerated all procedures well and without incident; questions were answered and patient was discharged.

## 2015-02-24 DIAGNOSIS — C44529 Squamous cell carcinoma of skin of other part of trunk: Secondary | ICD-10-CM | POA: Diagnosis not present

## 2015-03-07 ENCOUNTER — Other Ambulatory Visit: Payer: Self-pay | Admitting: Family Medicine

## 2015-03-07 NOTE — Telephone Encounter (Signed)
Refill appropriate and filled per protocol. 

## 2015-04-05 ENCOUNTER — Encounter: Payer: Self-pay | Admitting: Family Medicine

## 2015-04-05 ENCOUNTER — Ambulatory Visit (INDEPENDENT_AMBULATORY_CARE_PROVIDER_SITE_OTHER): Payer: Medicare Other | Admitting: Family Medicine

## 2015-04-05 VITALS — BP 126/72 | HR 76 | Temp 98.1°F | Resp 12 | Ht 65.5 in | Wt 136.0 lb

## 2015-04-05 DIAGNOSIS — M654 Radial styloid tenosynovitis [de Quervain]: Secondary | ICD-10-CM | POA: Diagnosis not present

## 2015-04-05 MED ORDER — METHYLPREDNISOLONE 4 MG PO TBPK: ORAL_TABLET | ORAL | Status: DC

## 2015-04-05 NOTE — Progress Notes (Signed)
Patient ID: Caroline Valdez, female   DOB: 05-Feb-1951, 64 y.o.   MRN: [id number]   Subjective:    Patient ID: Caroline Valdez, female    DOB: 08-27-1950, 64 y.o.   MRN: [id number]  Patient presents for L Hand Pain  patient here with left wrist pain for the past 4 weeks. She did have an accident she was playing basketball and she fell backwards but she did not enter her wrists she actually hit the back of her head and had a little whiplash. She has soreness in her neck for a few days but that resolved. About a week later she began having some pain in her left wrist. She's been wearing a brace over the past 3 weeks that she has history of carpal tunnel in the right hand and she was not sure if this was just the beginning of carpal tunnel which she has not had any improvement with the brace. She has von Willebrand's disease and is unable to take any anti-inflammatories over-the-counter. He does not use any ice.    Review Of Systems:  GEN- denies fatigue, fever, weight loss,weakness, recent illness HEENT- denies eye drainage, change in vision, nasal discharge, CVS- denies chest pain, palpitations RESP- denies SOB, cough, wheeze MSK-+ joint pain, muscle aches, injury Neuro- denies headache, dizziness, syncope, seizure activity       Objective:    BP 126/72 mmHg  Pulse 76  Temp(Src) 98.1 F (36.7 C) (Oral)  Resp 12  Ht 5' 5.5" (1.664 m)  Wt 136 lb (61.689 kg)  BMI 22.28 kg/m2 GEN- NAD, alert and oriented x3 Neck- Supple, good ROM, neg spurlings  MSK- FROM upper EXT, Left wrist- TTP along thumb and base, no swelling noted, +finklesteins left side, good ROM wrist, no bony pathology Pulses- Radial,- 2+         Assessment & Plan:      Problem List Items Addressed This Visit    None    Visit Diagnoses    De Quervain's tenosynovitis, left    -  Primary    Based on exam, and tenosynovitis/tendinitis of wrist, will give Medrol dosepakl, ICE wrist, if no improvement, refer to  ortho/xray       Note: This dictation was prepared with Dragon dictation along with smaller phrase technology. Any transcriptional errors that result from this process are unintentional.

## 2015-04-05 NOTE — Patient Instructions (Signed)
Take the steroids as prescribed  Hold on brace Use ICE  Call if not improved

## 2015-04-07 ENCOUNTER — Telehealth: Payer: Self-pay | Admitting: Family Medicine

## 2015-04-07 NOTE — Telephone Encounter (Signed)
Call placed to patient and patient made aware.  

## 2015-04-07 NOTE — Telephone Encounter (Signed)
Tell her to start taking the entire day's amount first thing in the morning. For today, go ahead and take all of the remaining pills for today NOW !!!

## 2015-04-07 NOTE — Telephone Encounter (Signed)
Call placed to patient.   Reports that she started the Medrol dose pack for wrist pain. States that the first night she was able to drift off to sleep, but kept waking up throughout the night. Reports that last night she was not able to drift off at all. Reports that she is tired, but she cannot sleep.   Please advise.

## 2015-04-07 NOTE — Telephone Encounter (Signed)
Patient was put on steroid methylprednisolone and now cannot sleep at all   [phone] please call and advise her what to do

## 2015-04-07 NOTE — Telephone Encounter (Signed)
Call placed to patient.   Reports that she is taking Medrol 4mg  as follows: Day 1: 2 tablets before breakfast, 1 tablet after lunch and after supper, and 2 tablets at bedtime.  Day 2: 1 tablet before breakfast, 1 tablet after lunch and after supper, and 2 tablets at bedtime.  Day 3: 1 tablet before breakfast and 1 tablet after lunch and after supper, and at bedtime.  Day 4: 1 tablet before breakfast, after lunch, and at bedtime.  Day 5: 1 tablet before breakfast and at bedtime.  Day 6: 1 tablet before breakfast.  Also reports that she did not take Xanax.

## 2015-04-07 NOTE — Telephone Encounter (Signed)
Make sure that she is taking the prednisone in the morning. Reassure her that asked the dose of prednisone decreases, this insomnia should also improve. If she has not already been using over-the-counter sleep aid such as Tylenol PM then I recommend she take this at bedtime. Also as see on her medication list Xanax which states that she uses at bedtime. Is she taking this at bedtime the last couple of nights? If patient states that she has already done all of the above measures and is still having insomnia, let me know.

## 2015-04-11 ENCOUNTER — Telehealth: Payer: Self-pay | Admitting: Family Medicine

## 2015-04-11 ENCOUNTER — Ambulatory Visit (HOSPITAL_COMMUNITY)
Admission: RE | Admit: 2015-04-11 | Discharge: 2015-04-11 | Disposition: A | Discharge status: Home / Self Care | Payer: Medicare Other | Source: Ambulatory Visit | Attending: Family Medicine | Admitting: Family Medicine

## 2015-04-11 DIAGNOSIS — M79645 Pain in left finger(s): Secondary | ICD-10-CM | POA: Insufficient documentation

## 2015-04-11 DIAGNOSIS — M19032 Primary osteoarthritis, left wrist: Secondary | ICD-10-CM | POA: Insufficient documentation

## 2015-04-11 DIAGNOSIS — M25532 Pain in left wrist: Secondary | ICD-10-CM

## 2015-04-11 DIAGNOSIS — M19042 Primary osteoarthritis, left hand: Secondary | ICD-10-CM | POA: Diagnosis not present

## 2015-04-11 NOTE — Telephone Encounter (Signed)
Okay to order?

## 2015-04-11 NOTE — Telephone Encounter (Signed)
Call placed to patient and patient made aware.   Order for x-ray placed.

## 2015-04-11 NOTE — Telephone Encounter (Signed)
Call placed to patient.   States that wrist and thumb are not improved from steroids.   Per chart notes, x-ray is recommended.   Ok to order?

## 2015-04-11 NOTE — Telephone Encounter (Signed)
Patient says she is still having pain in her wrist and thumb  Please call her to advise what she should do  [phone]

## 2015-04-12 ENCOUNTER — Other Ambulatory Visit: Payer: Self-pay | Admitting: *Deleted

## 2015-04-12 DIAGNOSIS — M19039 Primary osteoarthritis, unspecified wrist: Secondary | ICD-10-CM

## 2015-04-25 ENCOUNTER — Encounter: Payer: Self-pay | Admitting: Family Medicine

## 2015-04-25 ENCOUNTER — Ambulatory Visit (INDEPENDENT_AMBULATORY_CARE_PROVIDER_SITE_OTHER): Payer: Medicare Other | Admitting: Family Medicine

## 2015-04-25 VITALS — BP 102/64 | HR 68 | Temp 98.2°F | Resp 14 | Ht 65.5 in | Wt 135.0 lb

## 2015-04-25 DIAGNOSIS — E785 Hyperlipidemia, unspecified: Secondary | ICD-10-CM | POA: Diagnosis not present

## 2015-04-25 DIAGNOSIS — Z8673 Personal history of transient ischemic attack (TIA), and cerebral infarction without residual deficits: Secondary | ICD-10-CM | POA: Diagnosis not present

## 2015-04-25 LAB — COMPLETE METABOLIC PANEL WITH GFR
ALT: 21 U/L (ref 6–29)
AST: 24 U/L (ref 10–35)
Albumin: 4.3 g/dL (ref 3.6–5.1)
Alkaline Phosphatase: 48 U/L (ref 33–130)
BUN: 16 mg/dL (ref 7–25)
CHLORIDE: 107 mmol/L (ref 98–110)
CO2: 31 mmol/L (ref 20–31)
Calcium: 8.9 mg/dL (ref 8.6–10.4)
Creat: 0.77 mg/dL (ref 0.50–0.99)
GFR, Est Non African American: 81 mL/min (ref >=60)
Glucose, Bld: 88 mg/dL (ref 70–99)
POTASSIUM: 4.2 mmol/L (ref 3.5–5.3)
Sodium: 142 mmol/L (ref 135–146)
Total Bilirubin: 0.7 mg/dL (ref 0.2–1.2)
Total Protein: 6.2 g/dL (ref 6.1–8.1)

## 2015-04-25 LAB — LIPID PANEL
CHOL/HDL RATIO: 2.3 ratio (ref <=5.0)
CHOLESTEROL: 145 mg/dL (ref 125–200)
HDL: 63 mg/dL (ref >=46)
LDL Cholesterol: 69 mg/dL (ref <130)
TRIGLYCERIDES: 65 mg/dL (ref <150)
VLDL: 13 mg/dL (ref <30)

## 2015-04-25 MED ORDER — DICLOFENAC SODIUM 1 % TD GEL: 2.0000 g | Freq: Four times a day (QID) | TRANSDERMAL | Status: DC

## 2015-04-25 NOTE — Progress Notes (Signed)
Subjective:    Patient ID: Caroline Valdez, female    DOB: February 13, 1951, 64 y.o.   MRN: [id number]  HPI 6/16 Patient has a history of a TIA as well as hyperlipidemia. She is currently on aspirin 81 mg by mouth daily for prevention of stroke. She is also taking atorvastatin 40 mg by mouth daily. She denies any myalgias or right upper quadrant pain. Her immunizations are up-to-date. She is overdue for a fasting lipid panel and CMP. Her mammogram is up-to-date. She has a history of a hysterectomy and therefore does not require Pap smears. Her last colonoscopy was in 2011.   She is due for prevnar 13 today.  She also has a trigger finger in her right 4th mcp joint. At that time, my plan was: Blood pressure is excellent. Her preventative care is up-to-date. I will check a CMP as well as fasting lipid panel to monitor her hyperlipidemia. Goal LDL cholesterol is less than 70.  Received prevnar 13.  Offered cortisone injection for trigger finger but she deferred today.  04/25/15 Overall she is doing good. She is complaining of some pain in the left wrist near the first [facility] joint. She is scheduled to see the orthopedist tomorrow. She also continues to have a trigger finger in the right fourth MCP joint. She plans to discuss this with the orthopedist tomorrow. She denies any myalgias or right upper quadrant pain. She denies any chest pain or shortness of breath. She is compliant with her aspirin. She is tolerating her statin without any problems. She is here today for fasting lab work Past Medical History  Diagnosis Date  . Fibromyalgia   . 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 Endometriosis   . Radiculopathy   . GERD (gastroesophageal reflux disease)   . Von Willebrand's disease [facility])     history of  . Hypercholesteremia   . Breast cancer ([place])     left  . Mini stroke ([place]) 09/2012  . Von Willebrand disease ([place]) 09/11/2013   Past Surgical History  Procedure Laterality Date  . Mastectomy       left  . Hemorrhoid surgery    . Abdominal hysterectomy    . Tonsillectomy    . Shoulder surgery    . Back surgery    . Laminectomy    . Carpal tunnel release      right  . Breast reconstruction     Current Outpatient Prescriptions on File Prior to Visit  Medication Sig Dispense Refill  . ALPRAZolam (XANAX) 0.5 MG tablet Take 1 tablet (0.5 mg total) by mouth at bedtime as needed. 60 tablet 1  . aspirin 81 MG tablet Take 81 mg by mouth daily.    Marland Kitchen atorvastatin (LIPITOR) 40 MG tablet TAKE 1 TABLET DAILY 90 tablet 2  . calcium carbonate (OS-CAL) 600 MG TABS Take 600 mg by mouth daily.     . cholecalciferol (VITAMIN D) 1000 UNITS tablet Take 1,000 Units by mouth daily.    . methylPREDNISolone (MEDROL DOSEPAK) 4 MG TBPK tablet Take as directed on package 21 tablet 0  . Multiple Vitamins-Minerals (ICAPS MV PO) Take 1 capsule by mouth daily.     Marland Kitchen NEXIUM 40 MG capsule TAKE 1 CAPSULE DAILY 90 capsule 2  . zinc sulfate 220 MG capsule Take 220 mg by mouth daily.      . zoledronic acid (RECLAST) 5 MG/100ML SOLN Inject 5 mg into the vein once. yearly     No  current facility-administered medications on file prior to visit.   No Known Allergies Social History   Social History  . Marital Status: Married    Spouse Name: N/A  . Number of Children: N/A  . Years of Education: N/A   Occupational History  . Not on file.   Social History Main Topics  . Smoking status: Never Smoker   . Smokeless tobacco: Never Used  . Alcohol Use: No  . Drug Use: No  . Sexual Activity: Not on file     Comment: husband recently died from myelodysplastic syndrome in January of 2014   Other Topics Concern  . Not on file   Social History Narrative      Review of Systems  All other systems reviewed and are negative.      Objective:   Physical Exam  Constitutional: She is oriented to person, place, and time. She appears well-developed and well-nourished.  Neck: Neck supple. No JVD present.    Cardiovascular: Normal rate, regular rhythm and normal heart sounds.   No murmur heard. Pulmonary/Chest: Effort normal and breath sounds normal. No respiratory distress. She has no wheezes. She has no rales.  Abdominal: Soft. Bowel sounds are normal. She exhibits no distension. There is no tenderness. There is no rebound and no guarding.  Musculoskeletal: She exhibits no edema.  Neurological: She is alert and oriented to person, place, and time. She has normal reflexes. No cranial nerve deficit. She exhibits normal muscle tone. Coordination normal.  Vitals reviewed.         Assessment & Plan:  HLD (hyperlipidemia) - Plan: COMPLETE METABOLIC PANEL WITH GFR, Lipid panel  History of TIA (transient ischemic attack) - Plan: COMPLETE METABOLIC PANEL WITH GFR, Lipid panel  check fasting lipid panel in CMP. Goal LDL cholesterol is less than 70.

## 2015-04-26 ENCOUNTER — Ambulatory Visit (INDEPENDENT_AMBULATORY_CARE_PROVIDER_SITE_OTHER): Payer: Medicare Other | Admitting: Orthopedic Surgery

## 2015-04-26 ENCOUNTER — Telehealth: Payer: Self-pay | Admitting: Family Medicine

## 2015-04-26 ENCOUNTER — Encounter: Payer: Self-pay | Admitting: Family Medicine

## 2015-04-26 VITALS — BP 140/84 | Ht 65.5 in | Wt 135.0 lb

## 2015-04-26 DIAGNOSIS — M1990 Unspecified osteoarthritis, unspecified site: Secondary | ICD-10-CM | POA: Diagnosis not present

## 2015-04-26 DIAGNOSIS — M19049 Primary osteoarthritis, unspecified hand: Secondary | ICD-10-CM

## 2015-04-26 MED ORDER — DICLOFENAC SODIUM 1 % TD GEL: 2.0000 g | Freq: Four times a day (QID) | TRANSDERMAL | Status: DC

## 2015-04-26 NOTE — Progress Notes (Signed)
New patient evaluation  Chief Complaint  Patient presents with  . Wrist Pain    ER follow up for left wrist pain, referred by Dr. Buelah Manis.      64 year old female presents with pain in her left thumb 2 months. She describes throbbing burning aching pain 5 out of 10 over the base of the thumb at the [facility] joint which is unrelieved by steroids. She has von Willebrand's disease and cannot take anti-inflammatories. The Voltaren gel was not approved by Medicare. History of seasonal allergies some back pain denies chest pain shortness of breath or weight loss       Past Medical History  Diagnosis Date  . Fibromyalgia   . 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 Endometriosis   . Radiculopathy   . GERD (gastroesophageal reflux disease)   . Von Willebrand's disease [facility])     history of  . Hypercholesteremia   . Breast cancer ([place])     left  . Mini stroke ([place]) 09/2012  . Von Willebrand disease ([place]) 09/11/2013    BP 140/84 mmHg  Ht 5' 5.5" (1.664 m)  Wt 135 lb (61.236 kg)  BMI 22.12 kg/m2 She has a small frame she's well-developed well-nourished grooming hygiene normal she is on a 3 her mood is pleasant she has no gait disturbances unrelated.  Her thumb is subluxated it's tender at the [facility] joint she has normal range of motion and opposition but the thumb is unstable motor exam is normal for flexion and extension at the IP joint the scans intact sensations normal she has good pulses no epitrochlear lymphadenopathy  Her x-ray shows CMC subluxation arthritis. In dependent interpretation by dictating physician  Diagnosis [place] arthritis  Recommend splinting Reese send Voltaren gel prescription with information von Willebrand's disease  Come back 6 weeks if no improvement sent to hand surgery.

## 2015-04-26 NOTE — Patient Instructions (Signed)
Wear brace full time except sleep and bathing for 6 weeks

## 2015-04-26 NOTE — Telephone Encounter (Signed)
Pt called and states that her insurance will not cover Voltaren Gel. I checked into this at [facility] and they stated that her medicare will not cover it at all and it did not kick out a PA however [facility] no longer participates with [employer] as of 04/21/15 and it may cover it if she goes to a pharmacy that participates with [employer]. I called and informed pt of this and sent the RX to [facility] in [place] as requested by the patient. She will call me back if any problems getting the RX.

## 2015-05-31 ENCOUNTER — Encounter: Payer: Self-pay | Admitting: Orthopedic Surgery

## 2015-05-31 ENCOUNTER — Ambulatory Visit (INDEPENDENT_AMBULATORY_CARE_PROVIDER_SITE_OTHER): Payer: Medicare Other | Admitting: Orthopedic Surgery

## 2015-05-31 VITALS — BP 131/89 | Ht 65.5 in | Wt 135.0 lb

## 2015-05-31 DIAGNOSIS — M1990 Unspecified osteoarthritis, unspecified site: Secondary | ICD-10-CM

## 2015-05-31 DIAGNOSIS — M19049 Primary osteoarthritis, unspecified hand: Secondary | ICD-10-CM

## 2015-05-31 DIAGNOSIS — M65341 Trigger finger, right ring finger: Secondary | ICD-10-CM | POA: Diagnosis not present

## 2015-05-31 NOTE — Progress Notes (Signed)
Chief Complaint  Patient presents with  . Hand Pain    Left hand, trigger finger.   This is a new problem the patient is also being seen for 6 week follow-up regarding CMC arthritis of the left thumb. Review she has von Willebrand's disease. We treated her with splinting and Voltaren gel for her left CMC arthritis and she got some improvement but still hurting  New problem right ring finger triggering triggering for several weeks now pain over the A1 pulley locking catching exacerbated by extreme positions of flexion  Review of systems no numbness tingling is noted catching locking noted in the finger continued pain in functional deficits regarding the left thumb with activities of daily living  Past Medical History  Diagnosis Date  . Fibromyalgia   . 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 Endometriosis   . Radiculopathy   . GERD (gastroesophageal reflux disease)   . Von Willebrand's disease [facility])     history of  . Hypercholesteremia   . Breast cancer ([place])     left  . Mini stroke ([place]) 09/2012  . Von Willebrand disease ([place]) 09/11/2013   BP 131/89 mmHg  Ht 5' 5.5" (1.664 m)  Wt 135 lb (61.236 kg)  BMI 22.12 kg/m2 She is awake alert and going 3 mood and affect are normal her appearance is normal and she is well-groomed body habitus thin  Family history status normal though noncontributory  The left thumb is tender to subluxate a blood CMC joint she has normal passive range of motion is weak pinch instability noted with subluxation neurovascular exam is intact  Right hand A1 pulley tenderness catching popping with full range of motion no instability strength of the flexor tendons normal scans intact neurovascular exam normal as well  Impression #1 CMC arthritis failed splinting and oral anti-inflammatories follow-up by consultation with a hand specialist.  #2 triggering right ring finger injected follow-up as needed.  Trigger finger injection  Diagnosis  Right  ring finger triggering  Procedure injection A1 pulley Medications lidocaine 1% 1 mL and Depo-Medrol 40 mg 1 mL Skin prep alcohol and ethyl chloride Verbal consent was obtained Timeout confirmed the injection site  After cleaning the skin with alcohol and anesthetizing the skin with ethyl chloride the A1 pulley was palpated and the injection was performed without complication

## 2015-05-31 NOTE — Patient Instructions (Signed)
Send to Hand Surgeon, Dr, Amedeo Plenty for [facility] arthritis in left hand.

## 2015-06-03 ENCOUNTER — Other Ambulatory Visit: Payer: Self-pay | Admitting: *Deleted

## 2015-06-03 DIAGNOSIS — M19049 Primary osteoarthritis, unspecified hand: Secondary | ICD-10-CM

## 2015-06-03 NOTE — Telephone Encounter (Signed)
REFERRAL FAXED TO [facility] FOR DR GRAMIG 

## 2015-06-07 ENCOUNTER — Telehealth: Payer: Self-pay | Admitting: *Deleted

## 2015-06-07 NOTE — Telephone Encounter (Signed)
Patient called stating she has appointment with the hand specialist 06/30/15 at 10:00. Patient is making Dr. Aline Brochure and York Cerise aware.

## 2015-06-21 ENCOUNTER — Encounter: Payer: Self-pay | Admitting: Family Medicine

## 2015-06-21 ENCOUNTER — Other Ambulatory Visit: Payer: Self-pay | Admitting: Family Medicine

## 2015-06-21 ENCOUNTER — Ambulatory Visit (HOSPITAL_COMMUNITY)
Admission: RE | Admit: 2015-06-21 | Discharge: 2015-06-21 | Disposition: A | Discharge status: Home / Self Care | Payer: Medicare Other | Source: Ambulatory Visit | Attending: Family Medicine | Admitting: Family Medicine

## 2015-06-21 ENCOUNTER — Ambulatory Visit (INDEPENDENT_AMBULATORY_CARE_PROVIDER_SITE_OTHER): Payer: Medicare Other | Admitting: Family Medicine

## 2015-06-21 DIAGNOSIS — M5010 Cervical disc disorder with radiculopathy, unspecified cervical region: Secondary | ICD-10-CM | POA: Diagnosis not present

## 2015-06-21 DIAGNOSIS — M479 Spondylosis, unspecified: Secondary | ICD-10-CM | POA: Insufficient documentation

## 2015-06-21 DIAGNOSIS — Z1159 Encounter for screening for other viral diseases: Secondary | ICD-10-CM | POA: Diagnosis not present

## 2015-06-21 DIAGNOSIS — M47812 Spondylosis without myelopathy or radiculopathy, cervical region: Secondary | ICD-10-CM | POA: Diagnosis not present

## 2015-06-21 MED ORDER — PREDNISONE 20 MG PO TABS: ORAL_TABLET | ORAL | Status: DC

## 2015-06-21 MED ORDER — OXYCODONE-ACETAMINOPHEN 5-325 MG PO TABS: 1.0000 | ORAL_TABLET | Freq: Three times a day (TID) | ORAL | Status: DC | PRN

## 2015-06-21 NOTE — Progress Notes (Signed)
Subjective:    Patient ID: Caroline Valdez, female    DOB: [birth year], 65 y.o.   MRN: [id number]  HPI  patient fell last fall playing basketball with her grandson. She struck her head when she fell. Ever since that time she's had pain radiating down the right side of her neck into her right shoulder and down her right arm to her elbow. She describes the pain is similar to sciatica symptoms she had previously. She denies any weakness or numbness in her hand. She denies any numbness in her grip strength. She has no symptoms in her left arm or in her legs. There is no tenderness to palpation in her neck over the spinous processes. She has normal range of motion in the neck without pain but the pain is constantly radiating from the right side of her neck down her right arm . Past Medical History  Diagnosis Date  . Fibromyalgia   . 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 Endometriosis   . Radiculopathy   . GERD (gastroesophageal reflux disease)   . Von Willebrand's disease [facility])     history of  . Hypercholesteremia   . Breast cancer ([place])     left  . Mini stroke ([place]) 09/2012  . Von Willebrand disease ([place]) 09/11/2013   Past Surgical History  Procedure Laterality Date  . Mastectomy      left  . Hemorrhoid surgery    . Abdominal hysterectomy    . Tonsillectomy    . Shoulder surgery    . Back surgery    . Laminectomy    . Carpal tunnel release      right  . Breast reconstruction     Current Outpatient Prescriptions on File Prior to Visit  Medication Sig Dispense Refill  . ALPRAZolam (XANAX) 0.5 MG tablet Take 1 tablet (0.5 mg total) by mouth at bedtime as needed. 60 tablet 1  . aspirin 81 MG tablet Take 81 mg by mouth daily.    Marland Kitchen atorvastatin (LIPITOR) 40 MG tablet TAKE 1 TABLET DAILY 90 tablet 2  . calcium carbonate (OS-CAL) 600 MG TABS Take 600 mg by mouth daily.     . cholecalciferol (VITAMIN D) 1000 UNITS tablet Take 1,000 Units by mouth daily.    . Multiple  Vitamins-Minerals (ICAPS MV PO) Take 1 capsule by mouth daily.     Marland Kitchen NEXIUM 40 MG capsule TAKE 1 CAPSULE DAILY 90 capsule 2  . zinc sulfate 220 MG capsule Take 220 mg by mouth daily.      . zoledronic acid (RECLAST) 5 MG/100ML SOLN Inject 5 mg into the vein once. yearly    . diclofenac sodium (VOLTAREN) 1 % GEL Apply 2 g topically 4 (four) times daily. (Patient not taking: Reported on 06/21/2015) 100 g 3   No current facility-administered medications on file prior to visit.   Allergies  Allergen Reactions  . Aspirin Other (See Comments)    Pt has Von Willebrands Disease  . Tape Dermatitis   Social History   Social History  . Marital Status: Widowed    Spouse Name: N/A  . Number of Children: N/A  . Years of Education: N/A   Occupational History  . Not on file.   Social History Main Topics  . Smoking status: Never Smoker   . Smokeless tobacco: Never Used  . Alcohol Use: No  . Drug Use: No  . Sexual Activity: Not on file     Comment: husband recently  died from myelodysplastic syndrome in January of 2014   Other Topics Concern  . Not on file   Social History Narrative      Review of Systems  All other systems reviewed and are negative.      Objective:   Physical Exam  Constitutional: She appears well-developed and well-nourished.  Cardiovascular: Normal rate, regular rhythm and normal heart sounds.   Pulmonary/Chest: Effort normal and breath sounds normal.  Musculoskeletal:       Cervical back: She exhibits decreased range of motion, tenderness, pain and spasm. She exhibits no bony tenderness and no deformity.  Neurological: She has normal reflexes. She displays no atrophy and no tremor. No sensory deficit. She exhibits normal muscle tone.  Reflex Scores:      Tricep reflexes are 2+ on the right side and 2+ on the left side.      Bicep reflexes are 2+ on the right side and 2+ on the left side.      Brachioradialis reflexes are 2+ on the right side and 2+ on the  left side. Vitals reviewed.         Assessment & Plan:  Cervical disc disorder with radiculopathy of cervical region - Plan: predniSONE (DELTASONE) 20 MG tablet, oxyCODONE-acetaminophen (ROXICET) 5-325 MG tablet, DG Cervical Spine Complete, CANCELED: DG Cervical Spine Complete  Need for hepatitis C screening test - Plan: Hepatitis C Ab Reflex HCV RNA, QUANT    I suspect nerve impingement in the neck likely from a slipped disc after her fall. I will treat the patient with a prednisone taper pack for 6 days. I will also obtain an x-ray of her neck. The pain has been present for 3 months if it does not improve after the prednisone taper pack I will proceed with an MRI of the neck to evaluate for nerve impingement. Also gave her a prescription for Percocet 5/325 one by mouth every 6 hours when necessary pain. She is also due for hepatitis C screening I will obtain that lab work while she is here

## 2015-06-22 LAB — HEPATITIS C ANTIBODY: HCV Ab: NEGATIVE

## 2015-06-27 ENCOUNTER — Other Ambulatory Visit (HOSPITAL_COMMUNITY): Payer: Self-pay | Admitting: *Deleted

## 2015-06-27 DIAGNOSIS — C50919 Malignant neoplasm of unspecified site of unspecified female breast: Secondary | ICD-10-CM

## 2015-06-29 ENCOUNTER — Encounter (HOSPITAL_COMMUNITY): Payer: Medicare Other

## 2015-06-29 ENCOUNTER — Encounter (HOSPITAL_COMMUNITY): Payer: Medicare Other | Attending: Hematology & Oncology | Admitting: Hematology & Oncology

## 2015-06-29 ENCOUNTER — Encounter (HOSPITAL_COMMUNITY): Payer: Self-pay | Admitting: Hematology & Oncology

## 2015-06-29 VITALS — BP 118/70 | HR 83 | Temp 98.5°F | Resp 16 | Wt 134.8 lb

## 2015-06-29 DIAGNOSIS — Z801 Family history of malignant neoplasm of trachea, bronchus and lung: Secondary | ICD-10-CM | POA: Diagnosis not present

## 2015-06-29 DIAGNOSIS — Z9889 Other specified postprocedural states: Secondary | ICD-10-CM | POA: Diagnosis not present

## 2015-06-29 DIAGNOSIS — Z8719 Personal history of other diseases of the digestive system: Secondary | ICD-10-CM | POA: Diagnosis not present

## 2015-06-29 DIAGNOSIS — Z9012 Acquired absence of left breast and nipple: Secondary | ICD-10-CM | POA: Insufficient documentation

## 2015-06-29 DIAGNOSIS — C50912 Malignant neoplasm of unspecified site of left female breast: Secondary | ICD-10-CM

## 2015-06-29 DIAGNOSIS — N809 Endometriosis, unspecified: Secondary | ICD-10-CM | POA: Diagnosis not present

## 2015-06-29 DIAGNOSIS — M797 Fibromyalgia: Secondary | ICD-10-CM | POA: Insufficient documentation

## 2015-06-29 DIAGNOSIS — Z8673 Personal history of transient ischemic attack (TIA), and cerebral infarction without residual deficits: Secondary | ICD-10-CM | POA: Insufficient documentation

## 2015-06-29 DIAGNOSIS — Z170 Estrogen receptor positive status [ER+]: Secondary | ICD-10-CM | POA: Diagnosis not present

## 2015-06-29 DIAGNOSIS — M5410 Radiculopathy, site unspecified: Secondary | ICD-10-CM | POA: Insufficient documentation

## 2015-06-29 DIAGNOSIS — Z90710 Acquired absence of both cervix and uterus: Secondary | ICD-10-CM | POA: Diagnosis not present

## 2015-06-29 DIAGNOSIS — D6800 Von Willebrand disease, unspecified: Secondary | ICD-10-CM

## 2015-06-29 DIAGNOSIS — M810 Age-related osteoporosis without current pathological fracture: Secondary | ICD-10-CM | POA: Diagnosis not present

## 2015-06-29 DIAGNOSIS — Z803 Family history of malignant neoplasm of breast: Secondary | ICD-10-CM | POA: Insufficient documentation

## 2015-06-29 DIAGNOSIS — E7800 Pure hypercholesterolemia, unspecified: Secondary | ICD-10-CM | POA: Insufficient documentation

## 2015-06-29 DIAGNOSIS — Z853 Personal history of malignant neoplasm of breast: Secondary | ICD-10-CM

## 2015-06-29 DIAGNOSIS — C50919 Malignant neoplasm of unspecified site of unspecified female breast: Secondary | ICD-10-CM

## 2015-06-29 DIAGNOSIS — K219 Gastro-esophageal reflux disease without esophagitis: Secondary | ICD-10-CM | POA: Insufficient documentation

## 2015-06-29 LAB — CBC WITH DIFFERENTIAL/PLATELET
Basophils Absolute: 0 10*3/uL (ref 0.0–0.1)
Basophils Relative: 0 %
EOS PCT: 2 %
Eosinophils Absolute: 0.1 10*3/uL (ref 0.0–0.7)
HCT: 42.8 % (ref 36.0–46.0)
Hemoglobin: 14.4 g/dL (ref 12.0–15.0)
LYMPHS ABS: 2.3 10*3/uL (ref 0.7–4.0)
LYMPHS PCT: 29 %
MCH: 31.9 pg (ref 26.0–34.0)
MCHC: 33.6 g/dL (ref 30.0–36.0)
MCV: 94.9 fL (ref 78.0–100.0)
MONO ABS: 0.8 10*3/uL (ref 0.1–1.0)
MONOS PCT: 10 %
Neutro Abs: 4.8 10*3/uL (ref 1.7–7.7)
Neutrophils Relative %: 59 %
PLATELETS: 199 10*3/uL (ref 150–400)
RBC: 4.51 MIL/uL (ref 3.87–5.11)
RDW: 12.7 % (ref 11.5–15.5)
WBC: 8 10*3/uL (ref 4.0–10.5)

## 2015-06-29 LAB — COMPREHENSIVE METABOLIC PANEL
ALT: 32 U/L (ref 14–54)
AST: 28 U/L (ref 15–41)
Albumin: 4 g/dL (ref 3.5–5.0)
Alkaline Phosphatase: 59 U/L (ref 38–126)
Anion gap: 7 (ref 5–15)
BUN: 15 mg/dL (ref 6–20)
CALCIUM: 9.3 mg/dL (ref 8.9–10.3)
CHLORIDE: 104 mmol/L (ref 101–111)
CO2: 31 mmol/L (ref 22–32)
CREATININE: 0.87 mg/dL (ref 0.44–1.00)
Glucose, Bld: 99 mg/dL (ref 65–99)
Potassium: 4 mmol/L (ref 3.5–5.1)
Sodium: 142 mmol/L (ref 135–145)
Total Bilirubin: 0.8 mg/dL (ref 0.3–1.2)
Total Protein: 6.5 g/dL (ref 6.5–8.1)

## 2015-06-29 NOTE — Progress Notes (Signed)
Caroline Fraction, MD [address] [place] [address]   DIAGNOSIS: Noninvasive ductal neoplasia the left breast, microinvasion, status post left mastectomy and reconstruction together with right breast reduction and implant, no evidence of disease. Surgery was performed in December 2010.  DCIS with questionable microinvasion.  Er+, PR-. Tumor size approximately 2 cm  Patient did not pursue endocrine therapy  Von Willebrand's disease, asymptomatic. Evaluated by Caroline Valdez in the past.   [place] expectation, intermittent use of DDAVP prior to surgical interventions for von Willebrand's disease  INTERVAL HISTORY: Caroline Valdez 65 y.o. female returns for follow-up of her breast cancer diagnosed in 2010. She has no major complaints today. She is doing well. She denies any bleeding or bruising. Her energy is good. She describes her appetite is unchanged. She will be due again for unilateral mammogram the end of February.  Caroline Valdez is here alone. She has no major complaints today.  States that she has taken Reclast since 2011.  When asked about her left wrist, she reports that she injured her wrist while playing basketball with her grandson. She is scheduled to see a hand specialist soon.   She is doing well. Denies breast issues or breast concerns. She performs self breast exams and has noticed in the last year that her left breast has become smaller than her right breast. Her appetite is good. She continues to stay active. Denies urinary or bowel issues. Denies SOB. Denies jaw issues. She receives regular dental exams and is aware of potential dental issues with Reclast.   She is up to date on her colonoscopy.  MEDICAL HISTORY: Past Medical History  Diagnosis Date  . Fibromyalgia   . 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 Endometriosis   . Radiculopathy   . GERD (gastroesophageal reflux disease)   . Von Willebrand's disease [facility])    history of  . Hypercholesteremia   . Breast cancer ([place])     left  . Mini stroke ([place]) 09/2012  . Von Willebrand disease ([place]) 09/11/2013    has Radiculopathy; Fibromyalgia; Endometriosis; Hypercholesteremia; Breast cancer ([place]); Von Willebrand disease ([place]); and Osteoporosis on her problem list.     is allergic to aspirin and tape.  Caroline Valdez had no medications administered during this visit.  SURGICAL HISTORY: Past Surgical History  Procedure Laterality Date  . Mastectomy      left  . Hemorrhoid surgery    . Abdominal hysterectomy    . Tonsillectomy    . Shoulder surgery    . Back surgery    . Laminectomy    . Carpal tunnel release      right  . Breast reconstruction      SOCIAL HISTORY: Social History   Social History  . Marital Status: Widowed    Spouse Name: N/A  . Number of Children: N/A  . Years of Education: N/A   Occupational History  . Not on file.   Social History Main Topics  . Smoking status: Never Smoker   . Smokeless tobacco: Never Used  . Alcohol Use: No  . Drug Use: No  . Sexual Activity: Not on file     Comment: husband recently died from myelodysplastic syndrome in January of 2014   Other Topics Concern  . Not on file   Social History Narrative    FAMILY HISTORY: Family History  Problem Relation Age of Onset  . Breast cancer Mother 60    now  82yo  . Stroke Father     Father had no full sisters  . Breast cancer Sister 84    now 55yo; had TAH/BSO  . Lung cancer Brother     died 26yo; smoker    Review of Systems  Constitutional: Negative for fever, chills, weight loss and malaise/fatigue.  HENT: Negative for congestion, hearing loss, nosebleeds, sore throat and tinnitus.   Eyes: Negative for blurred vision, double vision, pain and discharge.  Respiratory: Negative for cough, hemoptysis, sputum production, shortness of breath and wheezing.   Cardiovascular: Negative for chest pain, palpitations, claudication, leg swelling and  PND.  Gastrointestinal: Negative for heartburn, nausea, vomiting, abdominal pain, diarrhea, constipation, blood in stool and melena.  Genitourinary: Negative for dysuria, urgency, frequency and hematuria.  Musculoskeletal: Negative for myalgias, joint pain and falls.  Skin: Negative for itching and rash.  Neurological: Negative for dizziness, tingling, tremors, sensory change, speech change, focal weakness, seizures, loss of consciousness, weakness and headaches.  Endo/Heme/Allergies: Does not bruise/bleed easily.  Psychiatric/Behavioral: Negative for depression, suicidal ideas, memory loss and substance abuse. The patient is not nervous/anxious and does not have insomnia.    PHYSICAL EXAMINATION  ECOG PERFORMANCE STATUS: 0 - Asymptomatic  Filed Vitals:   06/29/15 1137  BP: 118/70  Pulse: 83  Temp: 98.5 F (36.9 C)  Resp: 16    Physical Exam  Constitutional: She is oriented to person, place, and time and well-developed, well-nourished, and in no distress.  HENT:  Head: Normocephalic and atraumatic.  Nose: Nose normal.  Mouth/Throat: Oropharynx is clear and moist. No oropharyngeal exudate.  Eyes: Conjunctivae and EOM are normal. Pupils are equal, round, and reactive to light. Right eye exhibits no discharge. Left eye exhibits no discharge. No scleral icterus.  Neck: Normal range of motion. Neck supple. No tracheal deviation present. No thyromegaly present.  Cardiovascular: Normal rate, regular rhythm and normal heart sounds.  Exam reveals no gallop and no friction rub.   No murmur heard. Pulmonary/Chest: Effort normal and breath sounds normal. She has no wheezes. She has no rales.    Abdominal: Soft. Bowel sounds are normal. She exhibits no distension and no mass. There is no tenderness. There is no rebound and no guarding.  Musculoskeletal: Normal range of motion. She exhibits no edema.  Wearing brace on left wrist due to recent injury.  Lymphadenopathy:    She has no cervical  adenopathy.  Neurological: She is alert and oriented to person, place, and time. She has normal reflexes. No cranial nerve deficit. Gait normal. Coordination normal.  Skin: Skin is warm and dry. No rash noted.  Psychiatric: Mood, memory, affect and judgment normal.  Nursing note and vitals reviewed.  LABORATORY DATA: I have reviewed the data as listed. CBC    Component Value Date/Time   WBC 8.0 06/29/2015 1052   RBC 4.51 06/29/2015 1052   HGB 14.4 06/29/2015 1052   HCT 42.8 06/29/2015 1052   PLT 199 06/29/2015 1052   MCV 94.9 06/29/2015 1052   MCH 31.9 06/29/2015 1052   MCHC 33.6 06/29/2015 1052   RDW 12.7 06/29/2015 1052   LYMPHSABS 2.3 06/29/2015 1052   MONOABS 0.8 06/29/2015 1052   EOSABS 0.1 06/29/2015 1052   BASOSABS 0.0 06/29/2015 1052   CMP     Component Value Date/Time   NA 142 06/29/2015 1052   K 4.0 06/29/2015 1052   CL 104 06/29/2015 1052   CO2 31 06/29/2015 1052   GLUCOSE 99 06/29/2015 1052   BUN 15 06/29/2015  1052   CREATININE 0.87 06/29/2015 1052   CREATININE 0.77 04/25/2015 0913   CALCIUM 9.3 06/29/2015 1052   PROT 6.5 06/29/2015 1052   ALBUMIN 4.0 06/29/2015 1052   AST 28 06/29/2015 1052   ALT 32 06/29/2015 1052   ALKPHOS 59 06/29/2015 1052   BILITOT 0.8 06/29/2015 1052   GFRNONAA >60 06/29/2015 1052   GFRNONAA 81 04/25/2015 0913   GFRAA >60 06/29/2015 1052   GFRAA >89 04/25/2015 0913    RADIOGRAPHIC STUDIES:  DEXA 07/2013 with osteopenia/osteoporosis CLINICAL DATA: Screening.  EXAM: DIGITAL SCREENING UNILATERAL RIGHT MAMMOGRAM WITH CAD  The patient has retropectoral implants. Standard and implant displaced views were performed.  COMPARISON: Previous exam(s)  ACR Breast Density Category c: The breast tissue is heterogeneously dense, which may obscure small masses.  FINDINGS: There are no findings suspicious for malignancy. Images were processed with CAD.  IMPRESSION: No mammographic evidence of malignancy. A result letter of  this screening mammogram will be mailed directly to the patient.  RECOMMENDATION: Screening mammogram in one year. (Code:SM-B-01Y)  BI-RADS CATEGORY 1: Negative.   Electronically Signed  By: Lovey Newcomer M.D.  On: 07/16/2014 08:15   ASSESSMENT and THERAPY PLAN:  Left mastectomy and reconstruction together with right breast reduction and implant, no evidence of disease. Surgery was performed in December 2010. DCIS with questionable microinvasion.  ER+, PR-. Tumor size approximately 2 cm Patient did not pursue endocrine therapy Von Willebrand's disease, asymptomatic. Evaluated by Caroline Valdez in the past.  Osteoporosis  She is up to date with mammography and will be due again at the end of the month.  I have set up the patient for a bone density test. Based on the results, we will evaluate if Reclast therapy should continue. She is to continue on calcium and vitamin D.  We discussed that we can order a breast MRI if she is concerned about her breast size difference and implant integrity. The patient would like to continue to monitor her breasts and will contact us if she decides to have an MRI. Exam today was unremarkable.  I advised her that PE may not be able to detect early problems with implants.   She will return in 1 year for routine follow up.  All questions were answered. The patient knows to call the clinic with any problems, questions or concerns. We can certainly see the patient much sooner if necessary.  This document serves as a record of services personally performed by Ancil Linsey, MD. It was created on her behalf by Arlyce Harman, a trained medical scribe. The creation of this record is based on the scribe's personal observations and the provider's statements to them. This document has been checked and approved by the attending provider.  I have reviewed the above documentation for accuracy and completeness, and I agree with the above.  Molli Hazard, MD  06/29/2015

## 2015-06-29 NOTE — Patient Instructions (Addendum)
[place] at [facility] Discharge Instructions  RECOMMENDATIONS MADE BY THE CONSULTANT AND ANY TEST RESULTS WILL BE SENT TO YOUR REFERRING PHYSICIAN.   Exam and discussion by Dr Whitney Muse today We are going to get a bone density, depending on the results of this we may stop your reclast. If reclast is still indicated we will still continue. New study shows that you can stop it after 5 years. We will schedule your mammogram Lab work was good  Return to see the doctor in 1 year with labs Please call the clinic if you have any questions or concerns    Thank you for choosing [place] [facility] to provide your oncology and hematology care.  To afford each patient quality time with our provider, please arrive at least 15 minutes before your scheduled appointment time.   Beginning January 23rd 2017 lab work for the [facility] will be done in the  Main lab at [facility] on 1st floor. If you have a lab appointment with the [facility] please come in thru the  Main Entrance and check in at the main information desk  You need to re-schedule your appointment should you arrive 10 or more minutes late.  We strive to give you quality time with our providers, and arriving late affects you and other patients whose appointments are after yours.  Also, if you no show three or more times for appointments you may be dismissed from the clinic at the providers discretion.     Again, thank you for choosing [facility].  Our hope is that these requests will decrease the amount of time that you wait before being seen by our physicians.       _____________________________________________________________  Should you have questions after your visit to [facility], please contact our office at [phone] between the hours of 8:30 a.m. and 4:30 p.m.  Voicemails left after 4:30 p.m. will not be returned until the following business  day.  For prescription refill requests, have your pharmacy contact our office.

## 2015-06-29 NOTE — Progress Notes (Signed)
This was discontinued because patient stated she no longer takes this med.

## 2015-06-30 DIAGNOSIS — M1812 Unilateral primary osteoarthritis of first carpometacarpal joint, left hand: Secondary | ICD-10-CM | POA: Diagnosis not present

## 2015-07-21 ENCOUNTER — Ambulatory Visit (HOSPITAL_COMMUNITY)
Admission: RE | Admit: 2015-07-21 | Discharge: 2015-07-21 | Disposition: A | Discharge status: Home / Self Care | Payer: Medicare Other | Source: Ambulatory Visit | Attending: Hematology & Oncology | Admitting: Hematology & Oncology

## 2015-07-21 DIAGNOSIS — Z1231 Encounter for screening mammogram for malignant neoplasm of breast: Secondary | ICD-10-CM | POA: Diagnosis not present

## 2015-07-21 DIAGNOSIS — M810 Age-related osteoporosis without current pathological fracture: Secondary | ICD-10-CM | POA: Diagnosis not present

## 2015-07-21 DIAGNOSIS — Z853 Personal history of malignant neoplasm of breast: Secondary | ICD-10-CM | POA: Diagnosis not present

## 2015-07-21 DIAGNOSIS — M8580 Other specified disorders of bone density and structure, unspecified site: Secondary | ICD-10-CM | POA: Insufficient documentation

## 2015-07-21 DIAGNOSIS — C50912 Malignant neoplasm of unspecified site of left female breast: Secondary | ICD-10-CM

## 2015-07-21 DIAGNOSIS — Z780 Asymptomatic menopausal state: Secondary | ICD-10-CM | POA: Diagnosis not present

## 2015-07-25 DIAGNOSIS — Z85828 Personal history of other malignant neoplasm of skin: Secondary | ICD-10-CM | POA: Diagnosis not present

## 2015-07-25 DIAGNOSIS — D485 Neoplasm of uncertain behavior of skin: Secondary | ICD-10-CM | POA: Diagnosis not present

## 2015-07-25 DIAGNOSIS — L570 Actinic keratosis: Secondary | ICD-10-CM | POA: Diagnosis not present

## 2015-07-28 DIAGNOSIS — M1812 Unilateral primary osteoarthritis of first carpometacarpal joint, left hand: Secondary | ICD-10-CM | POA: Diagnosis not present

## 2015-08-25 DIAGNOSIS — M1812 Unilateral primary osteoarthritis of first carpometacarpal joint, left hand: Secondary | ICD-10-CM | POA: Diagnosis not present

## 2015-09-05 ENCOUNTER — Other Ambulatory Visit (HOSPITAL_COMMUNITY): Payer: Self-pay | Admitting: Oncology

## 2015-09-05 DIAGNOSIS — D6800 Von Willebrand disease, unspecified: Secondary | ICD-10-CM

## 2015-09-05 MED ORDER — DESMOPRESSIN ACETATE SPRAY 0.01 % NA SOLN: 10.0000 ug | Freq: Two times a day (BID) | NASAL | Status: DC

## 2015-09-12 ENCOUNTER — Ambulatory Visit (INDEPENDENT_AMBULATORY_CARE_PROVIDER_SITE_OTHER): Payer: Medicare Other | Admitting: Family Medicine

## 2015-09-12 ENCOUNTER — Encounter: Payer: Self-pay | Admitting: Family Medicine

## 2015-09-12 VITALS — BP 122/68 | HR 86 | Temp 98.0°F | Resp 16 | Ht 65.5 in | Wt 135.0 lb

## 2015-09-12 DIAGNOSIS — H6123 Impacted cerumen, bilateral: Secondary | ICD-10-CM

## 2015-09-12 MED ORDER — NEOMYCIN-POLYMYXIN-HC 3.5-10000-1 OT SOLN: 3.0000 [drp] | Freq: Four times a day (QID) | OTIC | Status: DC

## 2015-09-12 NOTE — Progress Notes (Signed)
Subjective:    Patient ID: Caroline Valdez, female    DOB: [birth year], 65 y.o.   MRN: [id number]  HPI  Patient reports 4-5 days of bilateral ear pain and ear pressure. The left ear is worse than the right ear. The ear feels stopped up and she is not able to hear very well out of it. She is tried over-the-counter peroxide with varying success. She denies any fevers or chills. She denies any pain with chewing. She denies any nausea or vomiting. She denies any sore throat or cough. Examination today reveals bilateral cerumen impactions. Past Medical History  Diagnosis Date  . Fibromyalgia   . 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 Endometriosis   . Radiculopathy   . GERD (gastroesophageal reflux disease)   . Von Willebrand's disease [facility])     history of  . Hypercholesteremia   . Breast cancer ([place])     left  . Mini stroke ([place]) 09/2012  . Von Willebrand disease ([place]) 09/11/2013   Past Surgical History  Procedure Laterality Date  . Mastectomy      left  . Hemorrhoid surgery    . Abdominal hysterectomy    . Tonsillectomy    . Shoulder surgery    . Back surgery    . Laminectomy    . Carpal tunnel release      right  . Breast reconstruction     Current Outpatient Prescriptions on File Prior to Visit  Medication Sig Dispense Refill  . ALPRAZolam (XANAX) 0.5 MG tablet Take 1 tablet (0.5 mg total) by mouth at bedtime as needed. 60 tablet 1  . aspirin 81 MG tablet Take 81 mg by mouth daily.    Marland Kitchen atorvastatin (LIPITOR) 40 MG tablet TAKE 1 TABLET DAILY 90 tablet 2  . calcium carbonate (OS-CAL) 600 MG TABS Take 600 mg by mouth daily.     . cholecalciferol (VITAMIN D) 1000 UNITS tablet Take 1,000 Units by mouth daily.    Marland Kitchen desmopressin (DDAVP NASAL) 0.01 % solution Place 1 spray (10 mcg total) into the nose 2 (two) times daily. 5 mL 0  . Multiple Vitamins-Minerals (ICAPS MV PO) Take 1 capsule by mouth daily.     Marland Kitchen NEXIUM 40 MG capsule TAKE 1 CAPSULE DAILY 90 capsule 2  .  zinc sulfate 220 MG capsule Take 220 mg by mouth daily.      . zoledronic acid (RECLAST) 5 MG/100ML SOLN Inject 5 mg into the vein once. yearly     No current facility-administered medications on file prior to visit.   Allergies  Allergen Reactions  . Aspirin Other (See Comments)    Pt has Von Willebrands Disease  . Tape Dermatitis   Social History   Social History  . Marital Status: Widowed    Spouse Name: N/A  . Number of Children: N/A  . Years of Education: N/A   Occupational History  . Not on file.   Social History Main Topics  . Smoking status: Never Smoker   . Smokeless tobacco: Never Used  . Alcohol Use: No  . Drug Use: No  . Sexual Activity: Not on file     Comment: husband recently died from myelodysplastic syndrome in January of 2014   Other Topics Concern  . Not on file   Social History Narrative     Review of Systems  All other systems reviewed and are negative.      Objective:   Physical Exam  HENT:  Head: Normocephalic and atraumatic.  Right Ear: External ear normal.  Left Ear: External ear normal.  Nose: Nose normal.  Mouth/Throat: No oropharyngeal exudate.  Eyes: Conjunctivae are normal.  Neck: Neck supple.  Cardiovascular: Normal rate, regular rhythm and normal heart sounds.   Pulmonary/Chest: Effort normal and breath sounds normal.  Lymphadenopathy:    She has no cervical adenopathy.  Vitals reviewed.  Cerumen impaction was removed easily with irrigation and lavage. The right tympanic membrane was completely normal on examination after removal of the cerumen impaction. Left tympanic membrane is slightly erythematous from 10:00 to 12:00. It appears to be irritation from having the cerumen impaction removed. However I cannot exclude some mild otitis externa.       Assessment & Plan:  Cerumen impaction, bilateral  I believe the patient's symptoms were due to bilateral cerumen impactions. I believe that problem has been resolved with  irrigation and lavage. I did send a prescription for Cortisporin HC otic to her pharmacy. Should she start to develop pain in the left ear were I will seeing erythema I would like her to start using 3 drops 4 times a day for 1 week to treat otitis externa. However I believe the erythema I was seen was more likely due to the irrigation process

## 2015-09-14 DIAGNOSIS — H3530 Unspecified macular degeneration: Secondary | ICD-10-CM | POA: Diagnosis not present

## 2015-09-14 DIAGNOSIS — H5202 Hypermetropia, left eye: Secondary | ICD-10-CM | POA: Diagnosis not present

## 2015-09-14 DIAGNOSIS — H5211 Myopia, right eye: Secondary | ICD-10-CM | POA: Diagnosis not present

## 2015-09-14 DIAGNOSIS — H52221 Regular astigmatism, right eye: Secondary | ICD-10-CM | POA: Diagnosis not present

## 2015-09-21 DIAGNOSIS — M659 Synovitis and tenosynovitis, unspecified: Secondary | ICD-10-CM | POA: Diagnosis not present

## 2015-09-21 DIAGNOSIS — M189 Osteoarthritis of first carpometacarpal joint, unspecified: Secondary | ICD-10-CM | POA: Diagnosis not present

## 2015-09-21 DIAGNOSIS — M24042 Loose body in left finger joint(s): Secondary | ICD-10-CM | POA: Diagnosis not present

## 2015-09-21 DIAGNOSIS — M1812 Unilateral primary osteoarthritis of first carpometacarpal joint, left hand: Secondary | ICD-10-CM | POA: Diagnosis not present

## 2015-10-24 ENCOUNTER — Ambulatory Visit: Payer: Medicare Other | Admitting: Family Medicine

## 2015-10-31 ENCOUNTER — Ambulatory Visit (INDEPENDENT_AMBULATORY_CARE_PROVIDER_SITE_OTHER): Payer: Medicare Other | Admitting: Family Medicine

## 2015-10-31 ENCOUNTER — Encounter: Payer: Self-pay | Admitting: Family Medicine

## 2015-10-31 VITALS — BP 126/84 | HR 82 | Temp 98.1°F | Resp 16 | Ht 65.5 in | Wt 133.0 lb

## 2015-10-31 DIAGNOSIS — Z8673 Personal history of transient ischemic attack (TIA), and cerebral infarction without residual deficits: Secondary | ICD-10-CM | POA: Diagnosis not present

## 2015-10-31 DIAGNOSIS — E785 Hyperlipidemia, unspecified: Secondary | ICD-10-CM

## 2015-10-31 MED ORDER — PANTOPRAZOLE SODIUM 40 MG PO TBEC: 40.0000 mg | DELAYED_RELEASE_TABLET | Freq: Every day | ORAL | Status: DC

## 2015-10-31 NOTE — Progress Notes (Signed)
Subjective:    Patient ID: Caroline Valdez, female    DOB: 07-Aug-1950, 65 y.o.   MRN: [id number]  HPI  Patient has a history of a TIA as well as hyperlipidemia. She is currently on aspirin 81 mg by mouth daily for prevention of stroke. She is also taking atorvastatin 40 mg by mouth daily. She denies any myalgias or right upper quadrant pain. She denies any chest pain shortness of breath or dyspnea on exertion. She recently had surgery on her left wrist for an injury she sustained while playing basketball with her grandson  Past Medical History  Diagnosis Date  . Fibromyalgia   . 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 Endometriosis   . Radiculopathy   . GERD (gastroesophageal reflux disease)   . Von Willebrand's disease [facility])     history of  . Hypercholesteremia   . Breast cancer ([place])     left  . Mini stroke ([place]) 09/2012  . Von Willebrand disease ([place]) 09/11/2013   Past Surgical History  Procedure Laterality Date  . Mastectomy      left  . Hemorrhoid surgery    . Abdominal hysterectomy    . Tonsillectomy    . Shoulder surgery    . Back surgery    . Laminectomy    . Carpal tunnel release      right  . Breast reconstruction     Current Outpatient Prescriptions on File Prior to Visit  Medication Sig Dispense Refill  . ALPRAZolam (XANAX) 0.5 MG tablet Take 1 tablet (0.5 mg total) by mouth at bedtime as needed. 60 tablet 1  . aspirin 81 MG tablet Take 81 mg by mouth daily.    Marland Kitchen atorvastatin (LIPITOR) 40 MG tablet TAKE 1 TABLET DAILY 90 tablet 2  . calcium carbonate (OS-CAL) 600 MG TABS Take 600 mg by mouth daily.     . cholecalciferol (VITAMIN D) 1000 UNITS tablet Take 1,000 Units by mouth daily.    Marland Kitchen desmopressin (DDAVP NASAL) 0.01 % solution Place 1 spray (10 mcg total) into the nose 2 (two) times daily. 5 mL 0  . Multiple Vitamins-Minerals (ICAPS MV PO) Take 1 capsule by mouth daily.     Marland Kitchen neomycin-polymyxin-hydrocortisone (CORTISPORIN) otic solution Place 3  drops into both ears 4 (four) times daily. 10 mL 0  . NEXIUM 40 MG capsule TAKE 1 CAPSULE DAILY 90 capsule 2  . zinc sulfate 220 MG capsule Take 220 mg by mouth daily.      . zoledronic acid (RECLAST) 5 MG/100ML SOLN Inject 5 mg into the vein once. yearly     No current facility-administered medications on file prior to visit.   Allergies  Allergen Reactions  . Aspirin Other (See Comments)    Pt has Von Willebrands Disease  . Tape Dermatitis   Social History   Social History  . Marital Status: Widowed    Spouse Name: N/A  . Number of Children: N/A  . Years of Education: N/A   Occupational History  . Not on file.   Social History Main Topics  . Smoking status: Never Smoker   . Smokeless tobacco: Never Used  . Alcohol Use: No  . Drug Use: No  . Sexual Activity: Not on file     Comment: husband recently died from myelodysplastic syndrome in January of 2014   Other Topics Concern  . Not on file   Social History Narrative      Review of Systems  All other systems reviewed and are negative.      Objective:   Physical Exam  Constitutional: She is oriented to person, place, and time. She appears well-developed and well-nourished.  Neck: Neck supple. No JVD present.  Cardiovascular: Normal rate, regular rhythm and normal heart sounds.   No murmur heard. Pulmonary/Chest: Effort normal and breath sounds normal. No respiratory distress. She has no wheezes. She has no rales.  Abdominal: Soft. Bowel sounds are normal. She exhibits no distension. There is no tenderness. There is no rebound and no guarding.  Musculoskeletal: She exhibits no edema.  Neurological: She is alert and oriented to person, place, and time. She has normal reflexes. No cranial nerve deficit. She exhibits normal muscle tone. Coordination normal.  Vitals reviewed.         Assessment & Plan:  HLD (hyperlipidemia) - Plan: Lipid panel  History of TIA (transient ischemic attack)  check fasting lipid  panel.. Goal LDL cholesterol is less than 70.  CMP and CBC were just checked at the cancer center in February

## 2015-11-01 LAB — LIPID PANEL
CHOLESTEROL: 147 mg/dL (ref 125–200)
HDL: 66 mg/dL (ref >=46)
LDL CALC: 65 mg/dL (ref <130)
TRIGLYCERIDES: 82 mg/dL (ref <150)
Total CHOL/HDL Ratio: 2.2 Ratio (ref <=5.0)
VLDL: 16 mg/dL (ref <30)

## 2015-11-04 DIAGNOSIS — Z4789 Encounter for other orthopedic aftercare: Secondary | ICD-10-CM | POA: Diagnosis not present

## 2015-11-04 DIAGNOSIS — M1812 Unilateral primary osteoarthritis of first carpometacarpal joint, left hand: Secondary | ICD-10-CM | POA: Diagnosis not present

## 2015-12-02 ENCOUNTER — Other Ambulatory Visit: Payer: Self-pay | Admitting: Family Medicine

## 2015-12-02 DIAGNOSIS — Z4789 Encounter for other orthopedic aftercare: Secondary | ICD-10-CM | POA: Diagnosis not present

## 2015-12-02 DIAGNOSIS — M1812 Unilateral primary osteoarthritis of first carpometacarpal joint, left hand: Secondary | ICD-10-CM | POA: Diagnosis not present

## 2015-12-02 NOTE — Telephone Encounter (Signed)
Refill appropriate and filled per protocol. 

## 2015-12-05 ENCOUNTER — Ambulatory Visit (INDEPENDENT_AMBULATORY_CARE_PROVIDER_SITE_OTHER): Payer: Medicare Other | Admitting: Family Medicine

## 2015-12-05 ENCOUNTER — Encounter: Payer: Self-pay | Admitting: Family Medicine

## 2015-12-05 VITALS — BP 110/62 | HR 80 | Temp 98.0°F | Resp 14 | Ht 65.5 in

## 2015-12-05 DIAGNOSIS — M791 Myalgia: Secondary | ICD-10-CM | POA: Diagnosis not present

## 2015-12-05 DIAGNOSIS — IMO0001 Reserved for inherently not codable concepts without codable children: Secondary | ICD-10-CM

## 2015-12-05 DIAGNOSIS — M609 Myositis, unspecified: Secondary | ICD-10-CM | POA: Diagnosis not present

## 2015-12-05 LAB — CK: CK TOTAL: 79 U/L (ref 7–177)

## 2015-12-05 MED ORDER — OXYCODONE-ACETAMINOPHEN 5-325 MG PO TABS: 1.0000 | ORAL_TABLET | Freq: Three times a day (TID) | ORAL | Status: DC | PRN

## 2015-12-05 NOTE — Progress Notes (Signed)
Subjective:    Patient ID: Caroline Valdez, female    DOB: 06/28/1950, 65 y.o.   MRN: [id number]  HPI 10/31/15 Patient has a history of a TIA as well as hyperlipidemia. She is currently on aspirin 81 mg by mouth daily for prevention of stroke. She is also taking atorvastatin 40 mg by mouth daily. She denies any myalgias or right upper quadrant pain. She denies any chest pain shortness of breath or dyspnea on exertion. She recently had surgery on her left wrist for an injury she sustained while playing basketball with her grandson.  AT that time, my plan was:  check fasting lipid panel.. Goal LDL cholesterol is less than 70.  CMP and CBC were just checked at the cancer center in February  12/05/15 Patient complains of 2 weeks of diffuse myalgias all over her body. Pain is primarily in her neck, in her shoulders, in her lower back, and in her thighs. She denies any tick bite. She denies any injury. She denies any fevers or chills or rash. She denies any travel outside the country. He believes it could be a flare of her fibromyalgia although she has not taken anything for pain for fibromyalgia in 10 years. She is requesting a refill on Percocet that she is taking right now at night to help her sleep due to the pain Past Medical History  Diagnosis Date  . Fibromyalgia   . 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 Endometriosis   . Radiculopathy   . GERD (gastroesophageal reflux disease)   . Von Willebrand's disease [facility])     history of  . Hypercholesteremia   . Breast cancer ([place])     left  . Mini stroke ([place]) 09/2012  . Von Willebrand disease ([place]) 09/11/2013   Past Surgical History  Procedure Laterality Date  . Mastectomy      left  . Hemorrhoid surgery    . Abdominal hysterectomy    . Tonsillectomy    . Shoulder surgery    . Back surgery    . Laminectomy    . Carpal tunnel release      right  . Breast reconstruction     Current Outpatient Prescriptions on File Prior to  Visit  Medication Sig Dispense Refill  . ALPRAZolam (XANAX) 0.5 MG tablet Take 1 tablet (0.5 mg total) by mouth at bedtime as needed. 60 tablet 1  . aspirin 81 MG tablet Take 81 mg by mouth daily.    Marland Kitchen atorvastatin (LIPITOR) 40 MG tablet TAKE 1 TABLET DAILY 90 tablet 1  . calcium carbonate (OS-CAL) 600 MG TABS Take 600 mg by mouth daily.     . cholecalciferol (VITAMIN D) 1000 UNITS tablet Take 1,000 Units by mouth daily.    Marland Kitchen desmopressin (DDAVP NASAL) 0.01 % solution Place 1 spray (10 mcg total) into the nose 2 (two) times daily. 5 mL 0  . Multiple Vitamins-Minerals (ICAPS MV PO) Take 1 capsule by mouth daily.     Marland Kitchen neomycin-polymyxin-hydrocortisone (CORTISPORIN) otic solution Place 3 drops into both ears 4 (four) times daily. 10 mL 0  . NEXIUM 40 MG capsule TAKE 1 CAPSULE DAILY 90 capsule 2  . pantoprazole (PROTONIX) 40 MG tablet Take 1 tablet (40 mg total) by mouth daily. 90 tablet 3  . zinc sulfate 220 MG capsule Take 220 mg by mouth daily.      . zoledronic acid (RECLAST) 5 MG/100ML SOLN Inject 5 mg into the vein once. yearly  No current facility-administered medications on file prior to visit.   Allergies  Allergen Reactions  . Aspirin Other (See Comments)    Pt has Von Willebrands Disease  . Tape Dermatitis   Social History   Social History  . Marital Status: Widowed    Spouse Name: N/A  . Number of Children: N/A  . Years of Education: N/A   Occupational History  . Not on file.   Social History Main Topics  . Smoking status: Never Smoker   . Smokeless tobacco: Never Used  . Alcohol Use: No  . Drug Use: No  . Sexual Activity: Not on file     Comment: husband recently died from myelodysplastic syndrome in January of 2014   Other Topics Concern  . Not on file   Social History Narrative      Review of Systems  All other systems reviewed and are negative.      Objective:   Physical Exam  Constitutional: She is oriented to person, place, and time. She  appears well-developed and well-nourished.  Neck: Neck supple. No JVD present.  Cardiovascular: Normal rate, regular rhythm and normal heart sounds.   No murmur heard. Pulmonary/Chest: Effort normal and breath sounds normal. No respiratory distress. She has no wheezes. She has no rales.  Abdominal: Soft. Bowel sounds are normal. She exhibits no distension. There is no tenderness. There is no rebound and no guarding.  Musculoskeletal: She exhibits no edema.  Neurological: She is alert and oriented to person, place, and time. She has normal reflexes. No cranial nerve deficit. She exhibits normal muscle tone. Coordination normal.  Vitals reviewed.         Assessment & Plan:  Myalgia and myositis - Plan: oxyCODONE-acetaminophen (ROXICET) 5-325 MG tablet, Sedimentation rate, CK I'm not convinced that this is fibromyalgia. I will obtain a CK to look for myositis. I will check a sedimentation rate to look for autoimmune diseases such as polymyalgia rheumatica. I will temporarily have the patient discontinue Lipitor to see if her myalgias improve. I did give her a one-time prescription for Percocet that she can take at night for pain. Recheck in one week or sooner if worse

## 2015-12-06 LAB — SEDIMENTATION RATE: Sed Rate: 4 mm/hr (ref 0–30)

## 2015-12-14 ENCOUNTER — Telehealth: Payer: Self-pay | Admitting: Family Medicine

## 2015-12-14 NOTE — Telephone Encounter (Signed)
Patient is calling to say that her pain in her joints is better would like a call back, she said per dr pickard she was supposed to call and let him know how she was doing    [phone]

## 2015-12-15 MED ORDER — SIMVASTATIN 20 MG PO TABS: 20.0000 mg | ORAL_TABLET | Freq: Every day | ORAL | 3 refills | Status: DC

## 2015-12-15 NOTE — Telephone Encounter (Signed)
Patient aware of providers recommendations and med sent to pharm 

## 2015-12-15 NOTE — Telephone Encounter (Signed)
If her pain is better, I suspect the pain may be do to the Lipitor and not her fibromyalgia. Now that she is feeling better, I would like her to try pravastatin 20 mg a day which is a milder weaker statin and hopefully this will not cause joint pain. I will recheck lab work in 3 months

## 2015-12-16 ENCOUNTER — Telehealth: Payer: Self-pay | Admitting: Family Medicine

## 2015-12-16 NOTE — Telephone Encounter (Signed)
Called and spoke to pt and she had [facility] and they will over ride so she can get filled at [facility] as we do not carry samples of generic medications

## 2015-12-16 NOTE — Telephone Encounter (Signed)
simvastatin (ZOCOR) 20 MG tablet Patient is requesting samples states [facility] can't fill prescription since [facility]  CB# [phone]

## 2016-02-27 DIAGNOSIS — Z85828 Personal history of other malignant neoplasm of skin: Secondary | ICD-10-CM | POA: Diagnosis not present

## 2016-02-27 DIAGNOSIS — L570 Actinic keratosis: Secondary | ICD-10-CM | POA: Diagnosis not present

## 2016-03-14 ENCOUNTER — Telehealth: Payer: Self-pay | Admitting: Family Medicine

## 2016-03-14 DIAGNOSIS — Z0000 Encounter for general adult medical examination without abnormal findings: Secondary | ICD-10-CM

## 2016-03-14 DIAGNOSIS — F4321 Adjustment disorder with depressed mood: Secondary | ICD-10-CM

## 2016-03-14 MED ORDER — ACYCLOVIR 400 MG PO TABS: 400.0000 mg | ORAL_TABLET | Freq: Three times a day (TID) | ORAL | 0 refills | Status: DC

## 2016-03-14 NOTE — Telephone Encounter (Signed)
Ok with both

## 2016-03-14 NOTE — Telephone Encounter (Signed)
Requesting a refill on her Xanax - Ok to refill??      LRF 2014 CB# [phone]  Would also like a refill on Acyclovir for fever blister - med refilled.

## 2016-03-15 MED ORDER — ALPRAZOLAM 0.5 MG PO TABS: 0.5000 mg | ORAL_TABLET | Freq: Every evening | ORAL | 1 refills | Status: DC | PRN

## 2016-03-15 NOTE — Telephone Encounter (Signed)
Medication called/sent to requested pharmacy  

## 2016-03-18 IMAGING — DX DG HAND COMPLETE 3+V*L*
3 series · 3 of 3 positions shown · non-contrast
Comparison: None.

CLINICAL DATA: Left thumb pain. No known injury. Initial
evaluation.

EXAM:
LEFT HAND - COMPLETE 3+ VIEW

[hand pa]
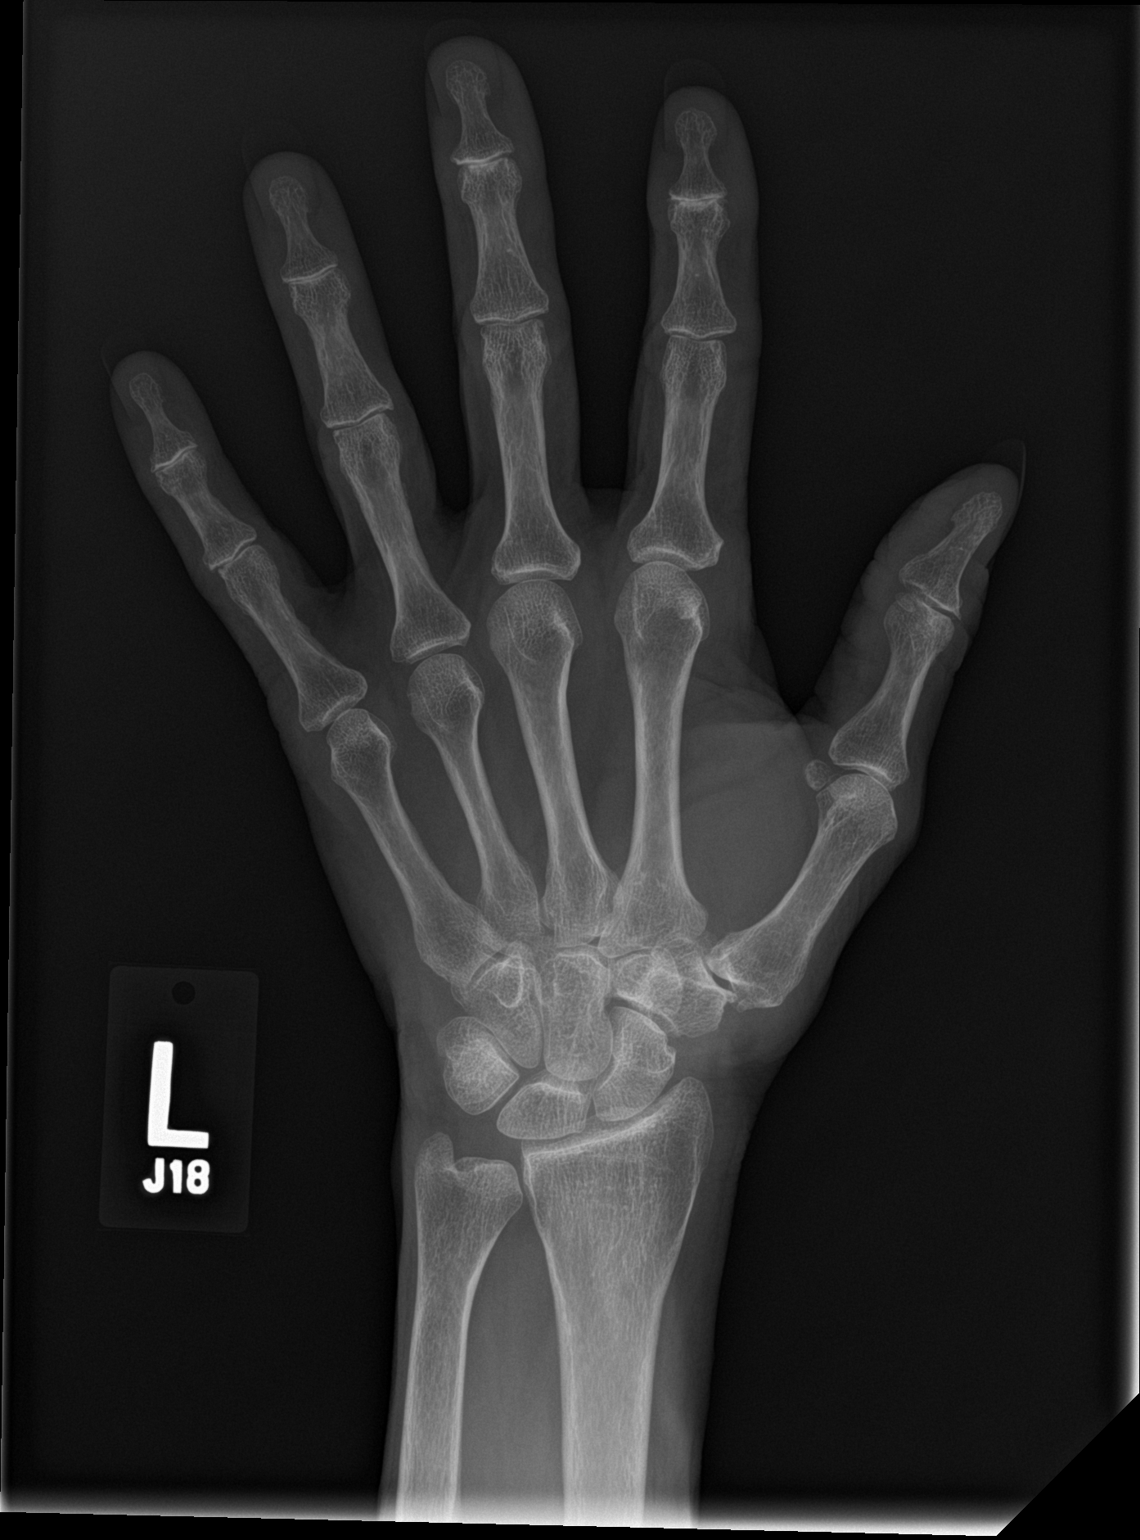

[hand obl]
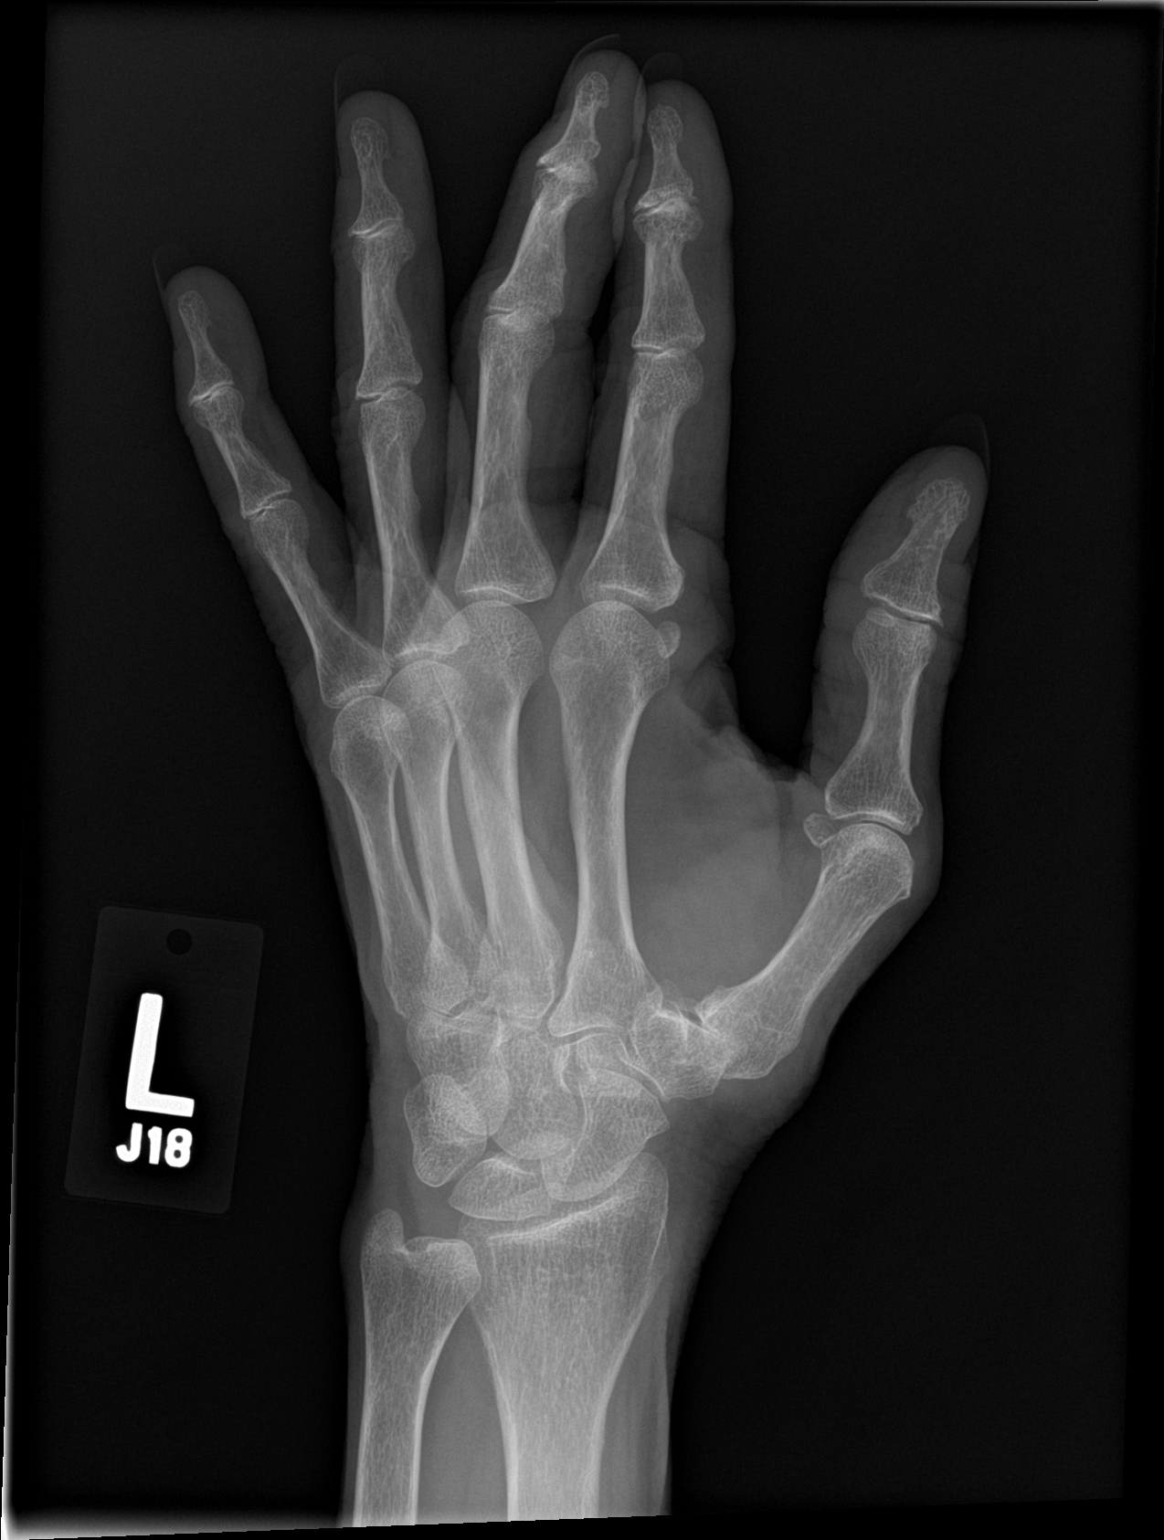

[hand lat]
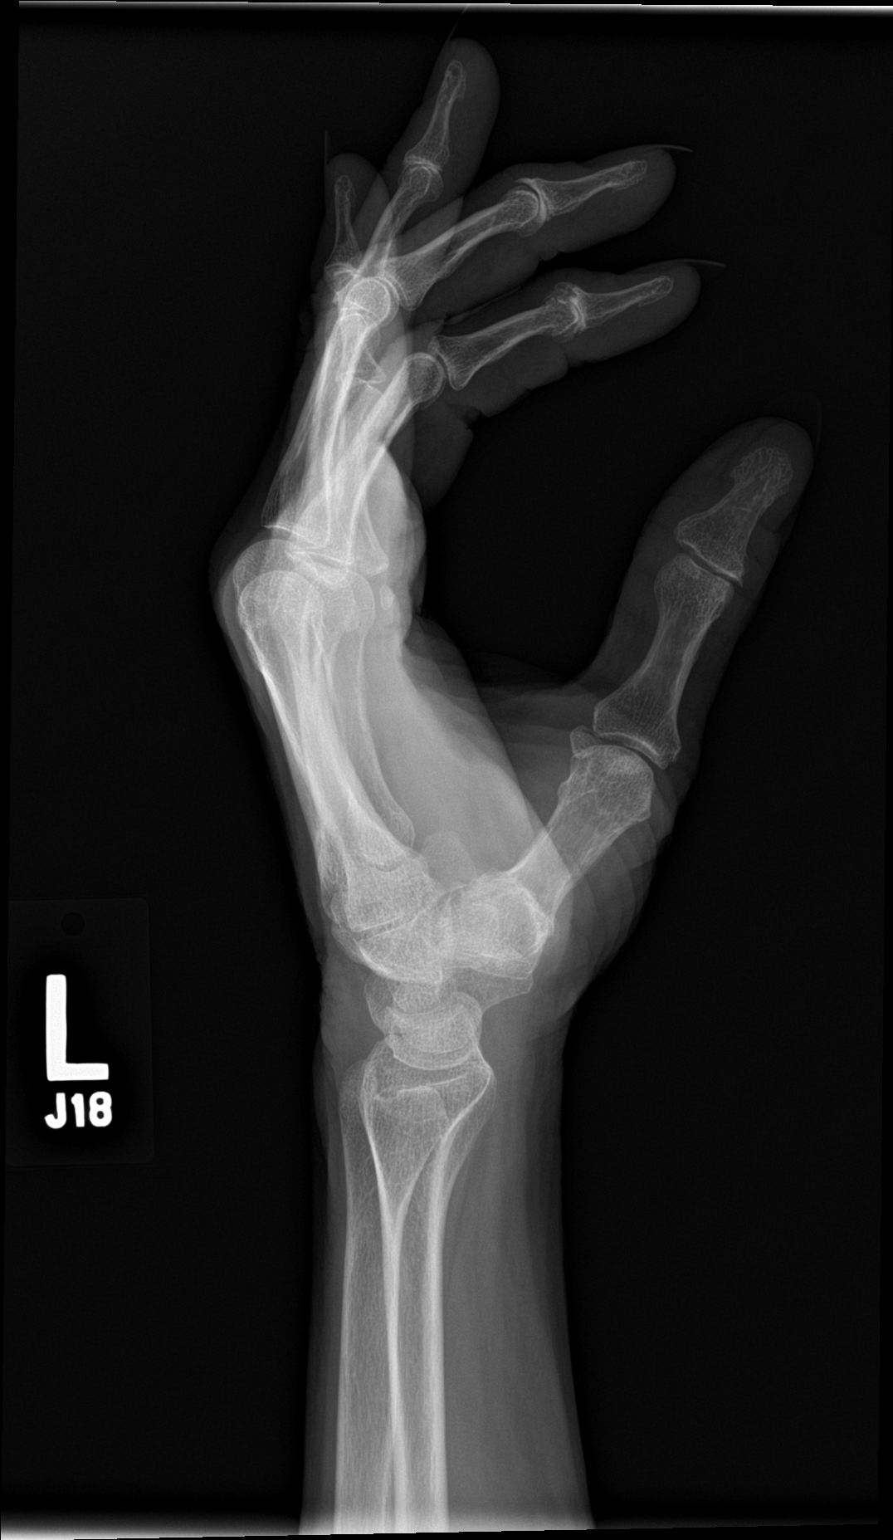

[3 of 3 positions shown; findings below may reference images not displayed]

FINDINGS: No acute bony or joint abnormality identified. Diffuse degenerative
change. Degenerative change most prominent first carpometacarpal
joint. No evidence of fracture or dislocation.
IMPRESSION: Diffuse degenerative changes left hand and wrist. No acute
abnormality.

## 2016-03-23 ENCOUNTER — Encounter: Payer: Self-pay | Admitting: Family Medicine

## 2016-03-23 ENCOUNTER — Ambulatory Visit (INDEPENDENT_AMBULATORY_CARE_PROVIDER_SITE_OTHER): Payer: Medicare Other | Admitting: Family Medicine

## 2016-03-23 ENCOUNTER — Ambulatory Visit: Payer: Medicare Other | Admitting: Family Medicine

## 2016-03-23 VITALS — BP 142/94 | HR 82 | Temp 97.9°F | Resp 16 | Ht 65.5 in | Wt 132.0 lb

## 2016-03-23 DIAGNOSIS — Z0000 Encounter for general adult medical examination without abnormal findings: Secondary | ICD-10-CM | POA: Diagnosis not present

## 2016-03-23 DIAGNOSIS — R0789 Other chest pain: Secondary | ICD-10-CM

## 2016-03-23 DIAGNOSIS — Z23 Encounter for immunization: Secondary | ICD-10-CM

## 2016-03-23 DIAGNOSIS — E7800 Pure hypercholesterolemia, unspecified: Secondary | ICD-10-CM

## 2016-03-23 DIAGNOSIS — Z8673 Personal history of transient ischemic attack (TIA), and cerebral infarction without residual deficits: Secondary | ICD-10-CM

## 2016-03-23 DIAGNOSIS — K219 Gastro-esophageal reflux disease without esophagitis: Secondary | ICD-10-CM

## 2016-03-23 LAB — CBC WITH DIFFERENTIAL/PLATELET
BASOS ABS: 0 {cells}/uL (ref 0–200)
Basophils Relative: 0 %
EOS ABS: 44 {cells}/uL (ref 15–500)
EOS PCT: 1 %
HCT: 43.2 % (ref 35.0–45.0)
Hemoglobin: 14.5 g/dL (ref 12.0–15.0)
Lymphocytes Relative: 30 %
Lymphs Abs: 1320 cells/uL (ref 850–3900)
MCH: 31.3 pg (ref 27.0–33.0)
MCHC: 33.6 g/dL (ref 32.0–36.0)
MCV: 93.1 fL (ref 80.0–100.0)
MONOS PCT: 12 %
MPV: 10.3 fL (ref 7.5–12.5)
Monocytes Absolute: 528 cells/uL (ref 200–950)
NEUTROS PCT: 57 %
Neutro Abs: 2508 cells/uL (ref 1500–7800)
PLATELETS: 203 10*3/uL (ref 140–400)
RBC: 4.64 MIL/uL (ref 3.80–5.10)
RDW: 13.3 % (ref 11.0–15.0)
WBC: 4.4 10*3/uL (ref 3.8–10.8)

## 2016-03-23 NOTE — Progress Notes (Signed)
Subjective:    Patient ID: Caroline Valdez, female    DOB: 1950-12-20, 65 y.o.   MRN: [id number]  HPI Patient is a very pleasant 65 year old white female who has a history of breast cancer in the left breast and also a history of a TIA who presents today for general medical exam. She states that over the last several months she's had episodes of panic attacks. These always occur at night. She will developed the sudden impending sense of dread like something bad is about to happen. The happen infrequently. They've occurred a proximally 6 times over several months. I gave her a prescription for Xanax but she's not had to use them yet. She denies any depression or anhedonia or suicidal thoughts. However she is also started having atypical chest pain. It is located to the left of her sternum. It occurs in the mornings. Last 10-15 minutes. There is no relationship to exertion. She denies any shortness of breath or angina. She is very physically active and she denies any chest pain with activity or with exercise. However she is also on Nexium she continues to have the symptoms. She denies taking any anti-inflammatories. She denies any cough or weight loss or hemoptysis or shortness of breath. She denies any blood in her stool. Her exam today is completely normal outside of some mild tenderness to palpation in the right upper quadrant Past Medical History:  Diagnosis Date  . Breast cancer ([place])    left  . 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 Endometriosis   . Fibromyalgia   . GERD (gastroesophageal reflux disease)   . Hypercholesteremia   . Mini stroke ([place]) 09/2012  . Radiculopathy   . Von Willebrand disease ([place]) 09/11/2013  . Von Willebrand's disease [facility])    history of   Past Surgical History:  Procedure Laterality Date  . ABDOMINAL HYSTERECTOMY    . BACK SURGERY    . BREAST RECONSTRUCTION    . CARPAL TUNNEL RELEASE     right  . HEMORRHOID SURGERY    . LAMINECTOMY    .  MASTECTOMY     left  . SHOULDER SURGERY    . TONSILLECTOMY     Current Outpatient Prescriptions on File Prior to Visit  Medication Sig Dispense Refill  . acyclovir (ZOVIRAX) 400 MG tablet Take 1 tablet (400 mg total) by mouth 3 (three) times daily. 30 tablet 0  . ALPRAZolam (XANAX) 0.5 MG tablet Take 1 tablet (0.5 mg total) by mouth at bedtime as needed. 60 tablet 1  . aspirin 81 MG tablet Take 81 mg by mouth daily.    . calcium carbonate (OS-CAL) 600 MG TABS Take 600 mg by mouth daily.     . cholecalciferol (VITAMIN D) 1000 UNITS tablet Take 1,000 Units by mouth daily.    Marland Kitchen desmopressin (DDAVP NASAL) 0.01 % solution Place 1 spray (10 mcg total) into the nose 2 (two) times daily. 5 mL 0  . Multiple Vitamins-Minerals (ICAPS MV PO) Take 1 capsule by mouth daily.     Marland Kitchen neomycin-polymyxin-hydrocortisone (CORTISPORIN) otic solution Place 3 drops into both ears 4 (four) times daily. 10 mL 0  . NEXIUM 40 MG capsule TAKE 1 CAPSULE DAILY 90 capsule 2  . oxyCODONE-acetaminophen (ROXICET) 5-325 MG tablet Take 1 tablet by mouth every 8 (eight) hours as needed for severe pain. 30 tablet 0  . pantoprazole (PROTONIX) 40 MG tablet Take 1 tablet (40 mg total) by mouth daily. 90 tablet 3  .  simvastatin (ZOCOR) 20 MG tablet Take 1 tablet (20 mg total) by mouth at bedtime. 30 tablet 3  . zinc sulfate 220 MG capsule Take 220 mg by mouth daily.      . zoledronic acid (RECLAST) 5 MG/100ML SOLN Inject 5 mg into the vein once. yearly     No current facility-administered medications on file prior to visit.    Allergies  Allergen Reactions  . Aspirin Other (See Comments)    Pt has Von Willebrands Disease  . Tape Dermatitis   Social History   Social History  . Marital status: Widowed    Spouse name: N/A  . Number of children: N/A  . Years of education: N/A   Occupational History  . Not on file.   Social History Main Topics  . Smoking status: Never Smoker  . Smokeless tobacco: Never Used  . Alcohol  use No  . Drug use: No  . Sexual activity: Not on file     Comment: husband recently died from myelodysplastic syndrome in January of 2014   Other Topics Concern  . Not on file   Social History Narrative  . No narrative on file   Family History  Problem Relation Age of Onset  . Breast cancer Mother 8    now 69yo  . Stroke Father     Father had no full sisters  . Breast cancer Sister 12    now 20yo; had TAH/BSO  . Lung cancer Brother     died 14yo; smoker   Immunization History  Administered Date(s) Administered  . Influenza Split 01/19/2014  . Influenza Whole 03/07/2009, 03/06/2010  . Influenza,inj,Quad PF,36+ Mos 01/26/2013, 02/17/2015  . Influenza-Unspecified 03/09/2016  . Pneumococcal Conjugate-13 10/22/2014  . Pneumococcal Polysaccharide-23 09/07/2010  . Td 12/25/2007  . Tdap 12/25/2007  . Zoster 03/30/2010   Mammogram is up-to-date. Colonoscopy is up-to-date.  Does not require a Pap smear given the fact she's had a hysterectomy.    Review of Systems  All other systems reviewed and are negative.      Objective:   Physical Exam  Constitutional: She is oriented to person, place, and time. She appears well-developed and well-nourished. No distress.  HENT:  Head: Normocephalic and atraumatic.  Right Ear: External ear normal.  Left Ear: External ear normal.  Nose: Nose normal.  Mouth/Throat: Oropharynx is clear and moist. No oropharyngeal exudate.  Eyes: Conjunctivae and EOM are normal. Pupils are equal, round, and reactive to light. Right eye exhibits no discharge. Left eye exhibits no discharge. No scleral icterus.  Neck: Normal range of motion. Neck supple. No JVD present. No tracheal deviation present. No thyromegaly present.  Cardiovascular: Normal rate, regular rhythm, normal heart sounds and intact distal pulses.  Exam reveals no gallop and no friction rub.   No murmur heard. Pulmonary/Chest: Effort normal and breath sounds normal. No stridor. No  respiratory distress. She has no wheezes. She has no rales. She exhibits no tenderness.  Abdominal: Soft. Bowel sounds are normal. She exhibits no distension. There is no tenderness. There is no rebound and no guarding.  Musculoskeletal: Normal range of motion. She exhibits no edema or tenderness.  Lymphadenopathy:    She has no cervical adenopathy.  Neurological: She is alert and oriented to person, place, and time. She displays normal reflexes. No cranial nerve deficit. She exhibits normal muscle tone. Coordination normal.  Skin: Skin is warm. No rash noted. She is not diaphoretic. No erythema. No pallor.  Psychiatric: She has a normal mood  and affect. Her behavior is normal. Judgment and thought content normal.  Vitals reviewed.         Assessment & Plan:  Atypical chest pain - Plan: EKG 12-Lead, CBC with Differential/Platelet, COMPLETE METABOLIC PANEL WITH GFR, Lipid panel, Lipase, H. pylori breath test  Routine general medical examination at a health care facility  Pure hypercholesterolemia - Plan: CBC with Differential/Platelet, COMPLETE METABOLIC PANEL WITH GFR, Lipid panel  History of TIA (transient ischemic attack) - Plan: CBC with Differential/Platelet, COMPLETE METABOLIC PANEL WITH GFR, Lipid panel  Gastroesophageal reflux disease without esophagitis  The patient's physical exam today is completely normal. She received Pneumovax 23 today.  She has already had her flu shot. I will check a CBC, CMP, fasting lipid panel. Cancer screening is up-to-date. Chest pain is extremely atypical. I did obtain an EKG today which showed Q waves in lead 3 and aVF. There was T-wave inversion in lead 3 and nonspecific ST changes in aVF. Lead 2 is reassuring. Although the EKG is abnormal I do not believe this is cardiac. That being said I will consult cardiology for possible stress test given her history of a TIA and her recent "panic attacks". However, I believe this is GI in etiology.  I will  check a lipase as well as an H. pylori breath test.

## 2016-03-23 NOTE — Addendum Note (Signed)
Addended by: Shary Decamp B on: 03/23/2016 02:31 PM   Modules accepted: Orders

## 2016-03-24 LAB — COMPLETE METABOLIC PANEL WITH GFR
ALBUMIN: 4.5 g/dL (ref 3.6–5.1)
ALK PHOS: 56 U/L (ref 33–130)
ALT: 14 U/L (ref 6–29)
AST: 23 U/L (ref 10–35)
BILIRUBIN TOTAL: 0.7 mg/dL (ref 0.2–1.2)
BUN: 14 mg/dL (ref 7–25)
CO2: 23 mmol/L (ref 20–31)
Calcium: 9.4 mg/dL (ref 8.6–10.4)
Chloride: 103 mmol/L (ref 98–110)
Creat: 0.72 mg/dL (ref 0.50–0.99)
GFR, EST NON AFRICAN AMERICAN: 88 mL/min (ref >=60)
GLUCOSE: 86 mg/dL (ref 70–99)
POTASSIUM: 4.4 mmol/L (ref 3.5–5.3)
SODIUM: 141 mmol/L (ref 135–146)
Total Protein: 6.9 g/dL (ref 6.1–8.1)

## 2016-03-24 LAB — LIPID PANEL
CHOL/HDL RATIO: 3.4 ratio (ref <=5.0)
Cholesterol: 182 mg/dL (ref 125–200)
HDL: 53 mg/dL (ref >=46)
LDL Cholesterol: 101 mg/dL (ref <130)
TRIGLYCERIDES: 138 mg/dL (ref <150)
VLDL: 28 mg/dL (ref <30)

## 2016-03-24 LAB — LIPASE: LIPASE: 31 U/L (ref 7–60)

## 2016-03-26 LAB — H. PYLORI BREATH TEST: H. pylori Breath Test: NOT DETECTED

## 2016-03-30 ENCOUNTER — Ambulatory Visit (INDEPENDENT_AMBULATORY_CARE_PROVIDER_SITE_OTHER): Payer: Medicare Other | Admitting: Internal Medicine

## 2016-03-30 ENCOUNTER — Encounter: Payer: Self-pay | Admitting: Internal Medicine

## 2016-03-30 VITALS — BP 130/80 | HR 84 | Ht 66.0 in | Wt 133.0 lb

## 2016-03-30 DIAGNOSIS — R9431 Abnormal electrocardiogram [ECG] [EKG]: Secondary | ICD-10-CM | POA: Diagnosis not present

## 2016-03-30 NOTE — Progress Notes (Signed)
Cardiology Office Note   Date:  03/30/2016   ID:  Caroline Valdez, DOB Dec 15, 1950, MRN [id number]  PCP:  [name], MD  Cardiologist:   Dorris Carnes, MD    Pt referred for CP     History of Present Illness: Caroline Valdez is a 65 y.o. female with a history of breast CA and TIA  Seen in medicine on 11/3 fro regular f/u  Complained of panic type spells   Did complain of CP  AM  Left sided  10 to 15 min  No SOB  Very active  Does take Nexium   Pt says the spells were just a couple days Thurs and Fri of last week  Up for awhile each time  Watching TV   Sharp  L sided  COming / going  No exacferbating factors  Gone in 30 min Occasional nagging pain on L side  Attrib to reflux in past  Few min  Not assocated with activity  Hx of ? TIA in 2014  Cant remember 3 hours of day in 2014  Not again  LDL was 101 on recent check HDL 53    EKG on 11/3 SR vs Ectopicatrial rhythm  IWMI  THis ins different from EKG in 2008       Outpatient Medications Prior to Visit  Medication Sig Dispense Refill  . acyclovir (ZOVIRAX) 400 MG tablet Take 1 tablet (400 mg total) by mouth 3 (three) times daily. (Patient taking differently: Take 400 mg by mouth 3 (three) times daily as needed. ) 30 tablet 0  . ALPRAZolam (XANAX) 0.5 MG tablet Take 1 tablet (0.5 mg total) by mouth at bedtime as needed. 60 tablet 1  . aspirin 81 MG tablet Take 81 mg by mouth daily.    . calcium carbonate (OS-CAL) 600 MG TABS Take 600 mg by mouth daily.     . cholecalciferol (VITAMIN D) 1000 UNITS tablet Take 1,000 Units by mouth daily.    . Multiple Vitamins-Minerals (ICAPS MV PO) Take 1 capsule by mouth daily.     Marland Kitchen NEXIUM 40 MG capsule TAKE 1 CAPSULE DAILY 90 capsule 2  . oxyCODONE-acetaminophen (ROXICET) 5-325 MG tablet Take 1 tablet by mouth every 8 (eight) hours as needed for severe pain. 30 tablet 0  . pantoprazole (PROTONIX) 40 MG tablet Take 1 tablet (40 mg total) by mouth daily. 90 tablet 3  . simvastatin (ZOCOR)  20 MG tablet Take 1 tablet (20 mg total) by mouth at bedtime. 30 tablet 3  . zinc sulfate 220 MG capsule Take 220 mg by mouth daily.      . zoledronic acid (RECLAST) 5 MG/100ML SOLN Inject 5 mg into the vein once. yearly    . desmopressin (DDAVP NASAL) 0.01 % solution Place 1 spray (10 mcg total) into the nose 2 (two) times daily. 5 mL 0  . neomycin-polymyxin-hydrocortisone (CORTISPORIN) otic solution Place 3 drops into both ears 4 (four) times daily. 10 mL 0   No facility-administered medications prior to visit.      Allergies:   Aspirin and Tape   Past Medical History:  Diagnosis Date  . Breast cancer ([place])    left  . 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 Endometriosis   . Fibromyalgia   . GERD (gastroesophageal reflux disease)   . Hypercholesteremia   . Mini stroke ([place]) 09/2012  . Radiculopathy   . Von Willebrand disease ([place]) 09/11/2013  . Von Willebrand's disease ([place])  history of    Past Surgical History:  Procedure Laterality Date  . ABDOMINAL HYSTERECTOMY    . BACK SURGERY    . BREAST RECONSTRUCTION    . CARPAL TUNNEL RELEASE     right  . HEMORRHOID SURGERY    . LAMINECTOMY    . MASTECTOMY     left  . SHOULDER SURGERY    . TONSILLECTOMY       Social History:  The patient  reports that she has never smoked. She has never used smokeless tobacco. She reports that she does not drink alcohol or use drugs.   Family History:  The patient's family history includes Breast cancer (age of onset: 47) in her sister; Breast cancer (age of onset: 45) in her mother; Lung cancer in her brother; Stroke in her father.    ROS:  Please see the history of present illness. All other systems are reviewed and  Negative to the above problem except as noted.    PHYSICAL EXAM: VS:  BP 130/80 (BP Location: Right Arm)   Pulse 84   Ht 5\' 6"  (1.676 m)   Wt 133 lb (60.3 kg)   SpO2 98%   BMI 21.47 kg/m   GEN: Well nourished, well developed, in no acute distress  HEENT:  normal  Neck: no JVD, carotid bruits, or masses Cardiac: RRR; no murmurs, rubs, or gallops,no edema  Respiratory:  clear to auscultation bilaterally, normal work of breathing GI: soft, nontender, nondistended, + BS  No hepatomegaly  MS: no deformity Moving all extremities   Skin: warm and dry, no rash Neuro:  Strength and sensation are intact Psych: euthymic mood, full affect   EKG:  EKG is ordered today.  SR 81 bpm  Q wave in AVL and V2   Lipid Panel    Component Value Date/Time   CHOL 182 03/23/2016 1000   TRIG 138 03/23/2016 1000   HDL 53 03/23/2016 1000   CHOLHDL 3.4 03/23/2016 1000   VLDL 28 03/23/2016 1000   LDLCALC 101 03/23/2016 1000      Wt Readings from Last 3 Encounters:  03/30/16 133 lb (60.3 kg)  03/23/16 132 lb (59.9 kg)  10/31/15 133 lb (60.3 kg)      ASSESSMENT AND PLAN:  1  CP  Atypical  I do not think it is cardiac in origin   EKG rom IM clinic prob due to abnormal lead placement. EKG today shows R waves in the inferior leads. She has Q waves in noncontiguoous leads  I do not think these are specific.  I would not schedule further cardiac testing  Unless she develops CP with exertion or change in ability to do things    2  HL  I would keep on statin   LDL good  F/U as needed if symptoms change   Current medicines are reviewed at length with the patient today.  The patient does not have concerns regarding medicines.  Signed, Dorris Carnes, MD  03/30/2016 9:33 AM    [address], [place], [place]  [address] Phone: [phone]; Fax: [phone]

## 2016-03-30 NOTE — Patient Instructions (Signed)
Your physician recommends that you schedule a follow-up appointment in: As Needed  Your physician recommends that you continue on your current medications as directed. Please refer to the Current Medication list given to you today.  Thank you for choosing [facility]!    

## 2016-04-03 ENCOUNTER — Other Ambulatory Visit: Payer: Self-pay | Admitting: Family Medicine

## 2016-04-03 MED ORDER — PRAVASTATIN SODIUM 20 MG PO TABS: 20.0000 mg | ORAL_TABLET | Freq: Every day | ORAL | 3 refills | Status: DC

## 2016-04-26 ENCOUNTER — Other Ambulatory Visit: Payer: Self-pay

## 2016-04-27 ENCOUNTER — Telehealth: Payer: Self-pay | Admitting: Family Medicine

## 2016-04-27 ENCOUNTER — Encounter: Payer: Self-pay | Admitting: Family Medicine

## 2016-04-27 ENCOUNTER — Ambulatory Visit (INDEPENDENT_AMBULATORY_CARE_PROVIDER_SITE_OTHER): Payer: Medicare Other | Admitting: Family Medicine

## 2016-04-27 VITALS — BP 132/80 | HR 78 | Temp 98.6°F | Resp 18 | Ht 65.5 in | Wt 132.0 lb

## 2016-04-27 DIAGNOSIS — J209 Acute bronchitis, unspecified: Secondary | ICD-10-CM | POA: Diagnosis not present

## 2016-04-27 DIAGNOSIS — J0110 Acute frontal sinusitis, unspecified: Secondary | ICD-10-CM

## 2016-04-27 MED ORDER — AMOXICILLIN 875 MG PO TABS: 875.0000 mg | ORAL_TABLET | Freq: Two times a day (BID) | ORAL | 0 refills | Status: DC

## 2016-04-27 MED ORDER — GUAIFENESIN-CODEINE 100-10 MG/5ML PO SOLN: 5.0000 mL | Freq: Four times a day (QID) | ORAL | 0 refills | Status: DC | PRN

## 2016-04-27 NOTE — Patient Instructions (Signed)
F/U  As needed Take antibiotics

## 2016-04-27 NOTE — Telephone Encounter (Signed)
Patient left VM stating the pharmacy didn't get the request for the cough medication with codeine in it.  CB# [phone]

## 2016-04-27 NOTE — Progress Notes (Signed)
   Subjective:    Patient ID: Caroline Valdez, female    DOB: [birth year], 65 y.o.   MRN: [id number]  Patient presents for Chest congestion and cough x 3 weeks Pt here with cough with congestion over the past 3 weeks. Initially started as a sore throat she's had sinus pressure and drainage which is also worsened. She has not had any recent fever or chills. Positive sick contacts. She does not have any underlying asthma or COPD. She's been using over-the-counter DayQuil and NyQuil with no improvement    Review Of Systems:  GEN- denies fatigue, fever, weight loss,weakness, recent illness HEENT- denies eye drainage, change in vision, +nasal discharge, CVS- denies chest pain, palpitations RESP- denies SOB, +cough, wheeze ABD- denies N/V, change in stools, abd pain GU- denies dysuria, hematuria, dribbling, incontinence MSK- denies joint pain, muscle aches, injury Neuro- denies headache, dizziness, syncope, seizure activity       Objective:    BP 132/80   Pulse 78   Temp 98.6 F (37 C) (Oral)   Resp 18   Ht 5' 5.5" (1.664 m)   Wt 132 lb (59.9 kg)   BMI 21.63 kg/m  GEN- NAD, alert and oriented x3 HEENT- PERRL, EOMI, non injected sclera, pink conjunctiva, MMM, oropharynx clear, TM clear bilat no effusion,  + frontal sinus tenderness, inflammed turbinates,  Nasal drainage  Neck- Supple, no LAD CVS- RRR, no murmur RESP-CTAB, no wheeze  EXT- No edema Pulses- Radial 2+          Assessment & Plan:      Problem List Items Addressed This Visit    None    Visit Diagnoses    Acute non-recurrent frontal sinusitis    -  Primary   Sinusitis with mild bronchitis, based on duration treat with amox, nasal rinse, robitussin codiene given   Relevant Medications   amoxicillin (AMOXIL) 875 MG tablet   guaiFENesin-codeine 100-10 MG/5ML syrup   Acute bronchitis, unspecified organism          Note: This dictation was prepared with Dragon dictation along with smaller phrase technology.  Any transcriptional errors that result from this process are unintentional.

## 2016-04-27 NOTE — Telephone Encounter (Signed)
Medication called to pharmacy. 

## 2016-05-04 ENCOUNTER — Encounter: Payer: Medicare Other | Admitting: Family Medicine

## 2016-06-19 ENCOUNTER — Other Ambulatory Visit (HOSPITAL_COMMUNITY): Payer: Self-pay | Admitting: Hematology & Oncology

## 2016-06-19 DIAGNOSIS — Z1231 Encounter for screening mammogram for malignant neoplasm of breast: Secondary | ICD-10-CM

## 2016-07-03 ENCOUNTER — Encounter (HOSPITAL_COMMUNITY): Payer: Medicare Other | Attending: Oncology | Admitting: Oncology

## 2016-07-03 ENCOUNTER — Encounter (HOSPITAL_COMMUNITY): Payer: Medicare Other

## 2016-07-03 VITALS — BP 126/79 | HR 78 | Temp 98.3°F | Resp 20 | Wt 132.2 lb

## 2016-07-03 DIAGNOSIS — Z886 Allergy status to analgesic agent status: Secondary | ICD-10-CM | POA: Diagnosis not present

## 2016-07-03 DIAGNOSIS — M797 Fibromyalgia: Secondary | ICD-10-CM | POA: Diagnosis not present

## 2016-07-03 DIAGNOSIS — M810 Age-related osteoporosis without current pathological fracture: Secondary | ICD-10-CM

## 2016-07-03 DIAGNOSIS — Z853 Personal history of malignant neoplasm of breast: Secondary | ICD-10-CM | POA: Diagnosis not present

## 2016-07-03 DIAGNOSIS — Z803 Family history of malignant neoplasm of breast: Secondary | ICD-10-CM | POA: Insufficient documentation

## 2016-07-03 DIAGNOSIS — E7800 Pure hypercholesterolemia, unspecified: Secondary | ICD-10-CM | POA: Insufficient documentation

## 2016-07-03 DIAGNOSIS — Z170 Estrogen receptor positive status [ER+]: Secondary | ICD-10-CM

## 2016-07-03 DIAGNOSIS — Z8673 Personal history of transient ischemic attack (TIA), and cerebral infarction without residual deficits: Secondary | ICD-10-CM | POA: Insufficient documentation

## 2016-07-03 DIAGNOSIS — Z823 Family history of stroke: Secondary | ICD-10-CM | POA: Diagnosis not present

## 2016-07-03 DIAGNOSIS — D680 Von Willebrand's disease: Secondary | ICD-10-CM

## 2016-07-03 DIAGNOSIS — Z801 Family history of malignant neoplasm of trachea, bronchus and lung: Secondary | ICD-10-CM | POA: Insufficient documentation

## 2016-07-03 DIAGNOSIS — K219 Gastro-esophageal reflux disease without esophagitis: Secondary | ICD-10-CM | POA: Diagnosis not present

## 2016-07-03 DIAGNOSIS — C50112 Malignant neoplasm of central portion of left female breast: Secondary | ICD-10-CM

## 2016-07-03 LAB — COMPREHENSIVE METABOLIC PANEL
ALBUMIN: 4.2 g/dL (ref 3.5–5.0)
ALT: 19 U/L (ref 14–54)
AST: 26 U/L (ref 15–41)
Alkaline Phosphatase: 49 U/L (ref 38–126)
Anion gap: 7 (ref 5–15)
BUN: 16 mg/dL (ref 6–20)
CHLORIDE: 103 mmol/L (ref 101–111)
CO2: 29 mmol/L (ref 22–32)
CREATININE: 0.71 mg/dL (ref 0.44–1.00)
Calcium: 9.6 mg/dL (ref 8.9–10.3)
GFR calc non Af Amer: >60 mL/min (ref >60)
GLUCOSE: 106 mg/dL — AB (ref 65–99)
Potassium: 4.8 mmol/L (ref 3.5–5.1)
SODIUM: 139 mmol/L (ref 135–145)
Total Bilirubin: 0.7 mg/dL (ref 0.3–1.2)
Total Protein: 7.1 g/dL (ref 6.5–8.1)

## 2016-07-03 LAB — CBC WITH DIFFERENTIAL/PLATELET
BASOS ABS: 0 10*3/uL (ref 0.0–0.1)
BASOS PCT: 0 %
EOS ABS: 0.1 10*3/uL (ref 0.0–0.7)
EOS PCT: 1 %
HCT: 40.2 % (ref 36.0–46.0)
Hemoglobin: 13.8 g/dL (ref 12.0–15.0)
Lymphocytes Relative: 30 %
Lymphs Abs: 1.6 10*3/uL (ref 0.7–4.0)
MCH: 31.9 pg (ref 26.0–34.0)
MCHC: 34.3 g/dL (ref 30.0–36.0)
MCV: 92.8 fL (ref 78.0–100.0)
Monocytes Absolute: 0.5 10*3/uL (ref 0.1–1.0)
Monocytes Relative: 9 %
NEUTROS PCT: 60 %
Neutro Abs: 3.3 10*3/uL (ref 1.7–7.7)
PLATELETS: 195 10*3/uL (ref 150–400)
RBC: 4.33 MIL/uL (ref 3.87–5.11)
RDW: 12.2 % (ref 11.5–15.5)
WBC: 5.5 10*3/uL (ref 4.0–10.5)

## 2016-07-03 NOTE — Patient Instructions (Addendum)
[place] at [facility] Discharge Instructions  RECOMMENDATIONS MADE BY THE CONSULTANT AND ANY TEST RESULTS WILL BE SENT TO YOUR REFERRING PHYSICIAN.  Follow up in 6 months with Dr. Talbert Cage for a breast exam   Thank you for choosing [place] at [facility] to provide your oncology and hematology care.  To afford each patient quality time with our provider, please arrive at least 15 minutes before your scheduled appointment time.    If you have a lab appointment with the [facility] please come in thru the  Main Entrance and check in at the main information desk  You need to re-schedule your appointment should you arrive 10 or more minutes late.  We strive to give you quality time with our providers, and arriving late affects you and other patients whose appointments are after yours.  Also, if you no show three or more times for appointments you may be dismissed from the clinic at the providers discretion.     Again, thank you for choosing [facility]  Our hope is that these requests will decrease the amount of time that you wait before being seen by our physicians.       _____________________________________________________________  Should you have questions after your visit to [facility], please contact our office at [phone] between the hours of 8:30 a.m. and 4:30 p.m.  Voicemails left after 4:30 p.m. will not be returned until the following business day.  For prescription refill requests, have your pharmacy contact our office.       Resources For Cancer Patients and their Caregivers ? American Cancer Society: Can assist with transportation, wigs, general needs, runs Look Good Feel Better.        670-484-2482 ? Cancer Care: Provides financial assistance, online support groups, medication/co-pay assistance.  1-800-813-HOPE 3250120914) ? [facility] Assists [place] cancer  patients and their families through emotional , educational and financial support.  (916)686-2444 ? [place] DSS Where to apply for food stamps, Medicaid and utility assistance. 872-658-0802 ? [facility]: Transportation to medical appointments. (303)208-2386 ? Social Security Administration: May apply for disability if have a Stage IV cancer. (551)107-5674 856-533-3457 ? [employer], Disability and Transit Services: Assists with nutrition, care and transit needs. [place] Support Programs: @10RELATIVEDAYS @ > Cancer Support Group  2nd Tuesday of the month 1pm-2pm, Journey Room  > Creative Journey  3rd Tuesday of the month 1130am-1pm, Journey Room  > Look Good Feel Better  1st Wednesday of the month 10am-12 noon, Journey Room (Call [place] to register [phone])

## 2016-07-03 NOTE — Progress Notes (Signed)
Caroline Fraction, MD [address] [place] [address]  PROGRESS NOTE  DIAGNOSIS: Noninvasive ductal neoplasia the left breast, microinvasion, status post left mastectomy and reconstruction together with right breast reduction and implant, no evidence of disease. Surgery was performed in December 2010.  DCIS with questionable microinvasion.  Er+, PR-. Tumor size approximately 2 cm  Patient did not pursue endocrine therapy  Von Willebrand's disease, asymptomatic. Evaluated by Caroline Valdez in the past.   [place] expectation, intermittent use of DDAVP prior to surgical interventions for von Willebrand's disease  INTERVAL HISTORY: Caroline Valdez 66 y.o. female returns for follow-up of her breast cancer diagnosed in 2010. She has no major complaints today. She is doing well. She denies any bleeding or bruising. Her energy is good. She describes her appetite is unchanged. She will be due again for unilateral mammogram the end of February.  Caroline Valdez is here alone for continued follow up.   Her last bone density scan 07/2015 showed she has osteoporosis in her lower lumber spine.    She notes she has dental problems. She has bones in her mouth that they were going to take out, but with her bleeding problem they decided not to take them out.   She states she routinely takes her calcium and vitamin D.      MEDICAL HISTORY: Past Medical History:  Diagnosis Date  . Breast cancer ([place])    left  . 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 Endometriosis   . Fibromyalgia   . GERD (gastroesophageal reflux disease)   . Hypercholesteremia   . Mini stroke ([place]) 09/2012  . Radiculopathy   . Von Willebrand disease ([place]) 09/11/2013  . Von Willebrand's disease [facility])    history of    has Radiculopathy; Fibromyalgia; Endometriosis; Hypercholesteremia; Breast cancer ([place]); Von Willebrand disease ([place]); and Osteoporosis on her problem list.     is  allergic to aspirin and tape.  Caroline Valdez had no medications administered during this visit.  SURGICAL HISTORY: Past Surgical History:  Procedure Laterality Date  . ABDOMINAL HYSTERECTOMY    . BACK SURGERY    . BREAST RECONSTRUCTION    . CARPAL TUNNEL RELEASE     right  . HEMORRHOID SURGERY    . LAMINECTOMY    . MASTECTOMY     left  . SHOULDER SURGERY    . TONSILLECTOMY      SOCIAL HISTORY: Social History   Social History  . Marital status: Widowed    Spouse name: N/A  . Number of children: N/A  . Years of education: N/A   Occupational History  . Not on file.   Social History Main Topics  . Smoking status: Never Smoker  . Smokeless tobacco: Never Used  . Alcohol use No  . Drug use: No  . Sexual activity: Not on file     Comment: husband recently died from myelodysplastic syndrome in January of 2014   Other Topics Concern  . Not on file   Social History Narrative  . No narrative on file    FAMILY HISTORY: Family History  Problem Relation Age of Onset  . Breast cancer Mother 73    now 75yo  . Stroke Father     Father had no full sisters  . Breast cancer Sister 21    now 5yo; had TAH/BSO  . Lung cancer Brother     died 71yo; smoker    Review of Systems  Constitutional: Negative.   HENT: Negative.  Negative for ear pain.   Eyes: Negative.   Respiratory: Negative.   Cardiovascular: Negative.   Gastrointestinal: Negative.   Genitourinary: Negative.   Musculoskeletal: Negative.   Skin: Negative.   Neurological: Negative.   Endo/Heme/Allergies: Negative.   Psychiatric/Behavioral: Negative.   All other systems reviewed and are negative.  PHYSICAL EXAMINATION  ECOG PERFORMANCE STATUS: 0 - Asymptomatic  Vitals:   07/03/16 1130  BP: 126/79  Pulse: 78  Resp: 20  Temp: 98.3 F (36.8 C)    Filed Weights   07/03/16 1130  Weight: 132 lb 3.2 oz (60 kg)     Physical Exam  Constitutional: She is oriented to person, place, and time and  well-developed, well-nourished, and in no distress.  HENT:  Head: Normocephalic and atraumatic.  Nose: Nose normal.  Mouth/Throat: Oropharynx is clear and moist. No oropharyngeal exudate.  Eyes: Conjunctivae and EOM are normal. Pupils are equal, round, and reactive to light. Right eye exhibits no discharge. Left eye exhibits no discharge. No scleral icterus.  Neck: Normal range of motion. Neck supple. No tracheal deviation present. No thyromegaly present.  Cardiovascular: Normal rate, regular rhythm and normal heart sounds.  Exam reveals no gallop and no friction rub.   No murmur heard. Pulmonary/Chest: Effort normal and breath sounds normal. She has no wheezes. She has no rales.    Bilateral breast exam performed.  Abdominal: Soft. Bowel sounds are normal. She exhibits no distension and no mass. There is no tenderness. There is no rebound and no guarding.  Musculoskeletal: Normal range of motion. She exhibits no edema.  Lymphadenopathy:    She has no cervical adenopathy.  Neurological: She is alert and oriented to person, place, and time. She has normal reflexes. No cranial nerve deficit. Gait normal. Coordination normal.  Skin: Skin is warm and dry. No rash noted.  Psychiatric: Mood, memory, affect and judgment normal.  Nursing note and vitals reviewed.  LABORATORY DATA: I have reviewed the data as listed. CBC    Component Value Date/Time   WBC 4.4 03/23/2016 1000   RBC 4.64 03/23/2016 1000   HGB 14.5 03/23/2016 1000   HCT 43.2 03/23/2016 1000   PLT 203 03/23/2016 1000   MCV 93.1 03/23/2016 1000   MCH 31.3 03/23/2016 1000   MCHC 33.6 03/23/2016 1000   RDW 13.3 03/23/2016 1000   LYMPHSABS 1,320 03/23/2016 1000   MONOABS 528 03/23/2016 1000   EOSABS 44 03/23/2016 1000   BASOSABS 0 03/23/2016 1000   CMP     Component Value Date/Time   NA 139 07/03/2016 1059   K 4.8 07/03/2016 1059   CL 103 07/03/2016 1059   CO2 29 07/03/2016 1059   GLUCOSE 106 (H) 07/03/2016 1059   BUN  16 07/03/2016 1059   CREATININE 0.71 07/03/2016 1059   CREATININE 0.72 03/23/2016 1000   CALCIUM 9.6 07/03/2016 1059   PROT 7.1 07/03/2016 1059   ALBUMIN 4.2 07/03/2016 1059   AST 26 07/03/2016 1059   ALT 19 07/03/2016 1059   ALKPHOS 49 07/03/2016 1059   BILITOT 0.7 07/03/2016 1059   GFRNONAA >60 07/03/2016 1059   GFRNONAA 88 03/23/2016 1000   GFRAA >60 07/03/2016 1059   GFRAA >89 03/23/2016 1000    RADIOGRAPHIC STUDIES: I have personally reviewed the radiological images as listed and agreed with the findings in the report.  DUAL X-RAY ABSORPTIOMETRY (DXA) FOR BONE MINERAL DENSITY  07/21/2015  IMPRESSION: Ordering Physician:  Dr. Patrici Ranks,  Your  patient Branden Ferrar completed a BMD test on 07/21/2015 using the [place] (software version: 14.10) manufactured by [employer]. The following summarizes the results of our evaluation. PATIENT BIOGRAPHICAL: Name: LILLYEN, VANLUVEN Patient ID: [id number] Birth Date: 15-Apr-1951 Height: 65.0 in. Gender: Female Exam Date: 07/21/2015 Weight: 134.0 lbs. Indications: Back surgery, Bilateral Oophrectomy, Caucasian, Follow up Osteopenia, Height Loss, History of Fracture (Adult), Hx Breast Ca, Post Menopausal Fractures: Clavicle Treatments: Asprin, Calcium, Multivitamin, Reclast, Vitamin D DENSITOMETRY RESULTS: Site      Region     Measured Date Measured Age WHO Classification Young Adult T-score BMD         %Change vs. Previous Significant Change (*) AP Spine L1-L4 (L3) 07/21/2015 [age] Osteoporosis -2.7 0.843 g/cm2 - -  DualFemur Total Left 07/21/2015 65.1 Osteopenia -2.3 0.718 g/cm2 - -  ASSESSMENT and THERAPY PLAN:  Left mastectomy and reconstruction together with right breast reduction and implant, no evidence of disease. Surgery was performed in December 2010. DCIS with questionable microinvasion.  ER+, PR-. Tumor size approximately 2 cm Patient did not pursue endocrine therapy Von  Willebrand's disease, asymptomatic. Evaluated by Caroline Valdez in the past.  Osteoporosis   Clinically NED today. Bilateral breast exam was performed today. Mammogram due on march 2018.  Reviewed bone density scan from march 2017 with patient. Talked about placement on bisphosphate, however she has multiple dental issues including bone growth, therefore the risk of osteonecrosis of the jaw is high. Therefore I have recommended for her to continue her calcium and vitamin D daily at this time.   RTC in 6 months for follow up for breast exam.   All questions were answered. The patient knows to call the clinic with any problems, questions or concerns. We can certainly see the patient much sooner if necessary.  This document serves as a record of services personally performed by Twana First, MD. It was created on her behalf by Shirlean Mylar, a trained medical scribe. The creation of this record is based on the scribe's personal observations and the provider's statements to them. This document has been checked and approved by the attending provider.   I have reviewed the above documentation for accuracy and completeness, and I agree with the above.  [employer]  07/03/2016

## 2016-07-09 ENCOUNTER — Ambulatory Visit (INDEPENDENT_AMBULATORY_CARE_PROVIDER_SITE_OTHER): Payer: Medicare Other | Admitting: Family Medicine

## 2016-07-09 ENCOUNTER — Encounter: Payer: Self-pay | Admitting: Family Medicine

## 2016-07-09 VITALS — BP 126/80 | HR 80 | Temp 98.0°F | Resp 16 | Ht 65.5 in | Wt 131.0 lb

## 2016-07-09 DIAGNOSIS — E7800 Pure hypercholesterolemia, unspecified: Secondary | ICD-10-CM | POA: Diagnosis not present

## 2016-07-09 DIAGNOSIS — Z8673 Personal history of transient ischemic attack (TIA), and cerebral infarction without residual deficits: Secondary | ICD-10-CM | POA: Diagnosis not present

## 2016-07-09 LAB — BASIC METABOLIC PANEL WITH GFR
BUN: 13 mg/dL (ref 7–25)
CALCIUM: 9.1 mg/dL (ref 8.6–10.4)
CO2: 28 mmol/L (ref 20–31)
Chloride: 105 mmol/L (ref 98–110)
Creat: 0.81 mg/dL (ref 0.50–0.99)
GFR, EST NON AFRICAN AMERICAN: 75 mL/min (ref >=60)
GFR, Est African American: 87 mL/min (ref >=60)
Glucose, Bld: 88 mg/dL (ref 70–99)
Potassium: 4.1 mmol/L (ref 3.5–5.3)
SODIUM: 143 mmol/L (ref 135–146)

## 2016-07-09 LAB — LIPID PANEL
CHOL/HDL RATIO: 4.4 ratio (ref <5.0)
Cholesterol: 219 mg/dL — ABNORMAL HIGH (ref <200)
HDL: 50 mg/dL — ABNORMAL LOW (ref >50)
LDL CALC: 140 mg/dL — AB (ref <100)
Triglycerides: 144 mg/dL (ref <150)
VLDL: 29 mg/dL (ref <30)

## 2016-07-09 MED ORDER — AMOXICILLIN 875 MG PO TABS: 875.0000 mg | ORAL_TABLET | Freq: Two times a day (BID) | ORAL | 0 refills | Status: DC

## 2016-07-09 NOTE — Progress Notes (Signed)
Subjective:    Patient ID: Caroline Valdez, female    DOB: [birth year], 66 y.o.   MRN: [id number]  HPI Patient is a very pleasant 66 year old white female who has a history of breast cancer in the left breast and also a history of a TIA who presents today for follow up.  When I saw the patient in July, she was having myalgias that we thought were treatable to statin medication. Therefore we temporarily discontinued her statin which at the time she was lipitor ( see ov 7/17).    07/09/16 Since that time, she has been taking pravastatin without any difficulty. She denies any myalgias or right upper quadrant pain. She is due today to recheck her cholesterol. Given her history of a TIA, her LDL goal is less than 70. Her oncologist recently discontinued her [place] last. Her bone density performed last year revealed a T score consistent with osteoporosis. She's been on treatment for approximately 5-6 years according to the patient and she is now been off medication for 1 year. She is due to repeat a bone density test in one year. She also complains of fullness in her frontal sinuses. It has been there for 3 weeks. She reports postnasal drip and cough and drainage. She reports sinus headaches. She reports eustachian tube dysfunction like symptoms in her right and left ear related to her sinuses. Past Medical History:  Diagnosis Date  . Breast cancer ([place])    left  . 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 Endometriosis   . Fibromyalgia   . GERD (gastroesophageal reflux disease)   . Hypercholesteremia   . Mini stroke ([place]) 09/2012  . Radiculopathy   . Von Willebrand disease ([place]) 09/11/2013  . Von Willebrand's disease [facility])    history of   Past Surgical History:  Procedure Laterality Date  . ABDOMINAL HYSTERECTOMY    . BACK SURGERY    . BREAST RECONSTRUCTION    . CARPAL TUNNEL RELEASE     right  . HEMORRHOID SURGERY    . LAMINECTOMY    . MASTECTOMY     left  . SHOULDER SURGERY    .  TONSILLECTOMY     Current Outpatient Prescriptions on File Prior to Visit  Medication Sig Dispense Refill  . acyclovir (ZOVIRAX) 400 MG tablet Take 1 tablet (400 mg total) by mouth 3 (three) times daily. (Patient taking differently: Take 400 mg by mouth 3 (three) times daily as needed. ) 30 tablet 0  . ALPRAZolam (XANAX) 0.5 MG tablet Take 1 tablet (0.5 mg total) by mouth at bedtime as needed. 60 tablet 1  . aspirin 81 MG tablet Take 81 mg by mouth daily.    . calcium carbonate (OS-CAL) 600 MG TABS Take 600 mg by mouth daily.     . cholecalciferol (VITAMIN D) 1000 UNITS tablet Take 1,000 Units by mouth daily.    . Multiple Vitamins-Minerals (ICAPS MV PO) Take 1 capsule by mouth daily.     Marland Kitchen NEXIUM 40 MG capsule TAKE 1 CAPSULE DAILY 90 capsule 2  . oxyCODONE-acetaminophen (ROXICET) 5-325 MG tablet Take 1 tablet by mouth every 8 (eight) hours as needed for severe pain. 30 tablet 0  . pantoprazole (PROTONIX) 40 MG tablet Take 1 tablet (40 mg total) by mouth daily. 90 tablet 3  . pravastatin (PRAVACHOL) 20 MG tablet Take 1 tablet (20 mg total) by mouth daily. 90 tablet 3  . zinc sulfate 220 MG capsule Take 220 mg by  mouth daily.      . zoledronic acid (RECLAST) 5 MG/100ML SOLN Inject 5 mg into the vein once. yearly     No current facility-administered medications on file prior to visit.    Allergies  Allergen Reactions  . Aspirin Other (See Comments)    Pt has Von Willebrands Disease Pt has [place]  . Tape Dermatitis   Social History   Social History  . Marital status: Widowed    Spouse name: N/A  . Number of children: N/A  . Years of education: N/A   Occupational History  . Not on file.   Social History Main Topics  . Smoking status: Never Smoker  . Smokeless tobacco: Never Used  . Alcohol use No  . Drug use: No  . Sexual activity: Not on file     Comment: husband recently died from myelodysplastic syndrome in January of 2014   Other Topics Concern  . Not on  file   Social History Narrative  . No narrative on file   Family History  Problem Relation Age of Onset  . Breast cancer Mother 51    now 74yo  . Stroke Father     Father had no full sisters  . Breast cancer Sister 26    now 8yo; had TAH/BSO  . Lung cancer Brother     died 21yo; smoker   Immunization History  Administered Date(s) Administered  . Influenza Split 01/19/2014  . Influenza Whole 03/07/2009, 03/06/2010  . Influenza,inj,Quad PF,36+ Mos 01/26/2013, 02/17/2015  . Influenza-Unspecified 03/09/2016  . Pneumococcal Conjugate-13 10/22/2014  . Pneumococcal Polysaccharide-23 09/07/2010, 03/23/2016  . Td 12/25/2007  . Tdap 12/25/2007  . Zoster 03/30/2010   Mammogram is up-to-date. Colonoscopy is up-to-date.  Does not require a Pap smear given the fact she's had a hysterectomy.    Review of Systems  All other systems reviewed and are negative.      Objective:   Physical Exam  Constitutional: She is oriented to person, place, and time. She appears well-developed and well-nourished. No distress.  HENT:  Head: Normocephalic and atraumatic.  Right Ear: External ear normal.  Left Ear: External ear normal.  Nose: Nose normal.  Mouth/Throat: Oropharynx is clear and moist. No oropharyngeal exudate.  Eyes: Conjunctivae and EOM are normal. Pupils are equal, round, and reactive to light. Right eye exhibits no discharge. Left eye exhibits no discharge. No scleral icterus.  Neck: Normal range of motion. Neck supple. No JVD present. No tracheal deviation present. No thyromegaly present.  Cardiovascular: Normal rate, regular rhythm, normal heart sounds and intact distal pulses.  Exam reveals no gallop and no friction rub.   No murmur heard. Pulmonary/Chest: Effort normal and breath sounds normal. No stridor. No respiratory distress. She has no wheezes. She has no rales. She exhibits no tenderness.  Abdominal: Soft. Bowel sounds are normal. She exhibits no distension. There is no  tenderness. There is no rebound and no guarding.  Musculoskeletal: Normal range of motion. She exhibits no edema or tenderness.  Lymphadenopathy:    She has no cervical adenopathy.  Neurological: She is alert and oriented to person, place, and time. She displays normal reflexes. No cranial nerve deficit. She exhibits normal muscle tone. Coordination normal.  Skin: Skin is warm. No rash noted. She is not diaphoretic. No erythema. No pallor.  Psychiatric: She has a normal mood and affect. Her behavior is normal. Judgment and thought content normal.  Vitals reviewed.         Assessment &  Plan:  Pure hypercholesterolemia - Plan: Lipid panel, BASIC METABOLIC PANEL WITH GFR  History of TIA (transient ischemic attack)  The patient's blood pressures well controlled. I will check a fasting lipid panel. Her goal LDL cholesterol is less than 70. Regarding her osteoporosis, she is taking calcium and vitamin D. I would repeat a bone density test in one year. If her T score begins to decline, she could consider trying prolia although there is a 2% chance of osteonecrosis of the jaw even with proliaa. I do believe the patient has a sinus infection which I will try to treat with amoxicillin 875 mg by mouth twice a day for 10 days

## 2016-07-11 ENCOUNTER — Encounter: Payer: Self-pay | Admitting: Family Medicine

## 2016-07-12 ENCOUNTER — Encounter: Payer: Self-pay | Admitting: Family Medicine

## 2016-07-12 MED ORDER — PRAVASTATIN SODIUM 40 MG PO TABS: 40.0000 mg | ORAL_TABLET | Freq: Every day | ORAL | 3 refills | Status: DC

## 2016-07-19 ENCOUNTER — Other Ambulatory Visit: Payer: Self-pay | Admitting: Family Medicine

## 2016-07-19 MED ORDER — SIMVASTATIN 40 MG PO TABS: 40.0000 mg | ORAL_TABLET | Freq: Every day | ORAL | 3 refills | Status: DC

## 2016-07-23 ENCOUNTER — Other Ambulatory Visit (HOSPITAL_COMMUNITY): Payer: Self-pay | Admitting: Oncology

## 2016-07-23 ENCOUNTER — Ambulatory Visit (HOSPITAL_COMMUNITY)
Admission: RE | Admit: 2016-07-23 | Discharge: 2016-07-23 | Disposition: A | Discharge status: Home / Self Care | Payer: Medicare Other | Source: Ambulatory Visit | Attending: Hematology & Oncology | Admitting: Hematology & Oncology

## 2016-07-23 ENCOUNTER — Other Ambulatory Visit (HOSPITAL_COMMUNITY): Payer: Self-pay | Admitting: Hematology & Oncology

## 2016-07-23 DIAGNOSIS — Z1231 Encounter for screening mammogram for malignant neoplasm of breast: Secondary | ICD-10-CM | POA: Diagnosis present

## 2016-08-09 ENCOUNTER — Encounter: Payer: Self-pay | Admitting: Family Medicine

## 2016-08-09 ENCOUNTER — Ambulatory Visit (INDEPENDENT_AMBULATORY_CARE_PROVIDER_SITE_OTHER): Payer: Medicare Other | Admitting: Family Medicine

## 2016-08-09 VITALS — BP 144/80 | HR 98 | Temp 98.7°F | Resp 16 | Ht 65.5 in | Wt 134.0 lb

## 2016-08-09 DIAGNOSIS — R350 Frequency of micturition: Secondary | ICD-10-CM | POA: Diagnosis not present

## 2016-08-09 DIAGNOSIS — R309 Painful micturition, unspecified: Secondary | ICD-10-CM

## 2016-08-09 DIAGNOSIS — N3001 Acute cystitis with hematuria: Secondary | ICD-10-CM | POA: Diagnosis not present

## 2016-08-09 LAB — URINALYSIS, ROUTINE W REFLEX MICROSCOPIC
Bilirubin Urine: NEGATIVE
Glucose, UA: NEGATIVE
KETONES UR: NEGATIVE
NITRITE: NEGATIVE
PROTEIN: NEGATIVE
Specific Gravity, Urine: 1.005 (ref 1.001–1.035)
pH: 5.5 (ref 5.0–8.0)

## 2016-08-09 LAB — URINALYSIS, MICROSCOPIC ONLY
Casts: NONE SEEN [LPF]
Crystals: NONE SEEN [HPF]
WBC, UA: >60 WBC/HPF — AB (ref <=5)
Yeast: NONE SEEN [HPF]

## 2016-08-09 MED ORDER — CIPROFLOXACIN HCL 500 MG PO TABS: 500.0000 mg | ORAL_TABLET | Freq: Two times a day (BID) | ORAL | 0 refills | Status: DC

## 2016-08-09 NOTE — Addendum Note (Signed)
Addended by: Shary Decamp B on: 08/09/2016 03:19 PM   Modules accepted: Orders

## 2016-08-09 NOTE — Progress Notes (Signed)
Subjective:    Patient ID: Caroline Valdez, female    DOB: November 20, 1950, 66 y.o.   MRN: [id number]  HPI Symptoms began 48 hours ago. Symptoms are characterized by dysuria, urgency, frequency. She also reports urinary hesitancy. Past Medical History:  Diagnosis Date  . Breast cancer ([place])    left  . 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 Endometriosis   . Fibromyalgia   . GERD (gastroesophageal reflux disease)   . Hypercholesteremia   . Mini stroke ([place]) 09/2012  . Radiculopathy   . Von Willebrand disease ([place]) 09/11/2013  . Von Willebrand's disease [facility])    history of   Past Surgical History:  Procedure Laterality Date  . ABDOMINAL HYSTERECTOMY    . BACK SURGERY    . BREAST RECONSTRUCTION    . CARPAL TUNNEL RELEASE     right  . HEMORRHOID SURGERY    . LAMINECTOMY    . MASTECTOMY     left  . SHOULDER SURGERY    . TONSILLECTOMY     Current Outpatient Prescriptions on File Prior to Visit  Medication Sig Dispense Refill  . acyclovir (ZOVIRAX) 400 MG tablet Take 1 tablet (400 mg total) by mouth 3 (three) times daily. (Patient taking differently: Take 400 mg by mouth 3 (three) times daily as needed. ) 30 tablet 0  . ALPRAZolam (XANAX) 0.5 MG tablet Take 1 tablet (0.5 mg total) by mouth at bedtime as needed. 60 tablet 1  . aspirin 81 MG tablet Take 81 mg by mouth daily.    . calcium carbonate (OS-CAL) 600 MG TABS Take 600 mg by mouth daily.     . cholecalciferol (VITAMIN D) 1000 UNITS tablet Take 1,000 Units by mouth daily.    . Multiple Vitamins-Minerals (ICAPS MV PO) Take 1 capsule by mouth daily.     Marland Kitchen NEXIUM 40 MG capsule TAKE 1 CAPSULE DAILY 90 capsule 2  . oxyCODONE-acetaminophen (ROXICET) 5-325 MG tablet Take 1 tablet by mouth every 8 (eight) hours as needed for severe pain. 30 tablet 0  . pantoprazole (PROTONIX) 40 MG tablet Take 1 tablet (40 mg total) by mouth daily. 90 tablet 3  . simvastatin (ZOCOR) 40 MG tablet Take 1 tablet (40 mg total) by mouth daily.  90 tablet 3  . zinc sulfate 220 MG capsule Take 220 mg by mouth daily.       No current facility-administered medications on file prior to visit.    Allergies  Allergen Reactions  . Aspirin Other (See Comments)    Pt has Von Willebrands Disease Pt has [place]  . Tape Dermatitis   Social History   Social History  . Marital status: Widowed    Spouse name: N/A  . Number of children: N/A  . Years of education: N/A   Occupational History  . Not on file.   Social History Main Topics  . Smoking status: Never Smoker  . Smokeless tobacco: Never Used  . Alcohol use No  . Drug use: No  . Sexual activity: Not on file     Comment: husband recently died from myelodysplastic syndrome in January of 2014   Other Topics Concern  . Not on file   Social History Narrative  . No narrative on file   Family History  Problem Relation Age of Onset  . Breast cancer Mother 45    now 74yo  . Stroke Father     Father had no Valdez sisters  . Breast cancer  Sister 110    now 59yo; had TAH/BSO  . Lung cancer Brother     died 25yo; smoker   Immunization History  Administered Date(s) Administered  . Influenza Split 01/19/2014  . Influenza Whole 03/07/2009, 03/06/2010  . Influenza,inj,Quad PF,36+ Mos 01/26/2013, 02/17/2015  . Influenza-Unspecified 03/09/2016  . Pneumococcal Conjugate-13 10/22/2014  . Pneumococcal Polysaccharide-23 09/07/2010, 03/23/2016  . Td 12/25/2007  . Tdap 12/25/2007  . Zoster 03/30/2010   Mammogram is up-to-date. Colonoscopy is up-to-date.  Does not require a Pap smear given the fact she's had a hysterectomy.    Review of Systems  All other systems reviewed and are negative.      Objective:   Physical Exam  Constitutional: She appears well-developed and well-nourished. No distress.  Cardiovascular: Normal rate, regular rhythm, normal heart sounds and intact distal pulses.  Exam reveals no gallop and no friction rub.   No murmur  heard. Pulmonary/Chest: Effort normal and breath sounds normal. No respiratory distress. She has no wheezes. She has no rales. She exhibits no tenderness.  Abdominal: Soft. Bowel sounds are normal. She exhibits no distension. There is no tenderness.  Skin: She is not diaphoretic.  Vitals reviewed.         Assessment & Plan:  Frequent urination - Plan: Urinalysis, Routine w reflex microscopic  Pain with urination - Plan: Urinalysis, Routine w reflex microscopic  Acute cystitis with hematuria Patient appears to have a urinary tract infection. Begin Cipro 500 mg by mouth twice a day for 5 days. She can use AZO for discomfort until antibiotics take effect. Culture is sent

## 2016-08-11 LAB — URINE CULTURE

## 2016-08-13 ENCOUNTER — Encounter: Payer: Self-pay | Admitting: Family Medicine

## 2016-08-27 DIAGNOSIS — Z85828 Personal history of other malignant neoplasm of skin: Secondary | ICD-10-CM | POA: Diagnosis not present

## 2016-08-27 DIAGNOSIS — L570 Actinic keratosis: Secondary | ICD-10-CM | POA: Diagnosis not present

## 2016-10-09 ENCOUNTER — Ambulatory Visit (INDEPENDENT_AMBULATORY_CARE_PROVIDER_SITE_OTHER): Payer: Medicare Other | Admitting: Family Medicine

## 2016-10-09 ENCOUNTER — Encounter: Payer: Self-pay | Admitting: Family Medicine

## 2016-10-09 VITALS — BP 130/80 | HR 74 | Temp 97.5°F | Resp 14 | Ht 65.5 in | Wt 134.0 lb

## 2016-10-09 DIAGNOSIS — E7800 Pure hypercholesterolemia, unspecified: Secondary | ICD-10-CM | POA: Diagnosis not present

## 2016-10-09 LAB — COMPLETE METABOLIC PANEL WITH GFR
ALT: 13 U/L (ref 6–29)
AST: 22 U/L (ref 10–35)
Albumin: 4.4 g/dL (ref 3.6–5.1)
Alkaline Phosphatase: 60 U/L (ref 33–130)
BILIRUBIN TOTAL: 0.7 mg/dL (ref 0.2–1.2)
BUN: 11 mg/dL (ref 7–25)
CHLORIDE: 105 mmol/L (ref 98–110)
CO2: 24 mmol/L (ref 20–31)
CREATININE: 0.89 mg/dL (ref 0.50–0.99)
Calcium: 9.2 mg/dL (ref 8.6–10.4)
GFR, EST NON AFRICAN AMERICAN: 67 mL/min (ref >=60)
GFR, Est African American: 78 mL/min (ref >=60)
GLUCOSE: 84 mg/dL (ref 70–99)
Potassium: 4 mmol/L (ref 3.5–5.3)
Sodium: 143 mmol/L (ref 135–146)
Total Protein: 6.8 g/dL (ref 6.1–8.1)

## 2016-10-09 LAB — CBC WITH DIFFERENTIAL/PLATELET
Basophils Absolute: 0 cells/uL (ref 0–200)
Basophils Relative: 0 %
Eosinophils Absolute: 90 cells/uL (ref 15–500)
Eosinophils Relative: 2 %
HEMATOCRIT: 42 % (ref 35.0–45.0)
HEMOGLOBIN: 14 g/dL (ref 12.0–15.0)
LYMPHS ABS: 1440 {cells}/uL (ref 850–3900)
Lymphocytes Relative: 32 %
MCH: 31.4 pg (ref 27.0–33.0)
MCHC: 33.3 g/dL (ref 32.0–36.0)
MCV: 94.2 fL (ref 80.0–100.0)
MONO ABS: 495 {cells}/uL (ref 200–950)
MPV: 10.4 fL (ref 7.5–12.5)
Monocytes Relative: 11 %
NEUTROS PCT: 55 %
Neutro Abs: 2475 cells/uL (ref 1500–7800)
Platelets: 192 10*3/uL (ref 140–400)
RBC: 4.46 MIL/uL (ref 3.80–5.10)
RDW: 13.3 % (ref 11.0–15.0)
WBC: 4.5 10*3/uL (ref 3.8–10.8)

## 2016-10-09 LAB — LIPID PANEL
Cholesterol: 198 mg/dL (ref <200)
HDL: 61 mg/dL (ref >50)
LDL CALC: 111 mg/dL — AB (ref <100)
TRIGLYCERIDES: 129 mg/dL (ref <150)
Total CHOL/HDL Ratio: 3.2 Ratio (ref <5.0)
VLDL: 26 mg/dL (ref <30)

## 2016-10-09 NOTE — Progress Notes (Signed)
Subjective:    Patient ID: Caroline Valdez, female    DOB: June 19, 1950, 66 y.o.   MRN: [id number]  HPI Patient is a very pleasant 66 year old white female who has a history of breast cancer in the left breast and also a history of a TIA who presents today for follow up.  When I saw the patient in July, she was having myalgias that we thought were treatable to statin medication. Therefore we temporarily discontinued her statin which at the time she was lipitor ( see ov 7/17).    07/09/16 Since that time, she has been taking pravastatin without any difficulty. She denies any myalgias or right upper quadrant pain. She is due today to recheck her cholesterol. Given her history of a TIA, her LDL goal is less than 70. Her oncologist recently discontinued her [place] last. Her bone density performed last year revealed a T score consistent with osteoporosis. She's been on treatment for approximately 5-6 years according to the patient and she is now been off medication for 1 year. She is due to repeat a bone density test in one year. She also complains of fullness in her frontal sinuses. It has been there for 3 weeks. She reports postnasal drip and cough and drainage. She reports sinus headaches. She reports eustachian tube dysfunction like symptoms in her right and left ear related to her sinuses.  At that time, my plan was: The patient's blood pressures well controlled. I will check a fasting lipid panel. Her goal LDL cholesterol is less than 70. Regarding her osteoporosis, she is taking calcium and vitamin D. I would repeat a bone density test in one year. If her T score begins to decline, she could consider trying prolia although there is a 2% chance of osteonecrosis of the jaw even with proliaa. I do believe the patient has a sinus infection which I will try to treat with amoxicillin 875 mg by mouth twice a day for 10 days  10/09/16 Last LDL was >140.  We pushed simvastatin to 40 mg a day.  My note saying  pravastatin was incorrect.  She denies any side effects on the simvastatin. She denies any myalgias or right upper quadrant pain. She denies any strokelike symptoms. On examination today, there is no pitting edema. She has excellent peripheral pulses checked at the dorsalis pedis pulses bilaterally. Lungs are clear and there are no carotid bruits. Overall she is doing well and she is simply here to recheck her lab work  Past Medical History:  Diagnosis Date  . Breast cancer ([place])    left  . 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 Endometriosis   . Fibromyalgia   . GERD (gastroesophageal reflux disease)   . Hypercholesteremia   . Mini stroke ([place]) 09/2012  . Radiculopathy   . Von Willebrand disease ([employer]) 09/11/2013  . Von Willebrand's disease [facility])    history of   Past Surgical History:  Procedure Laterality Date  . ABDOMINAL HYSTERECTOMY    . BACK SURGERY    . BREAST RECONSTRUCTION    . CARPAL TUNNEL RELEASE     right  . HEMORRHOID SURGERY    . LAMINECTOMY    . MASTECTOMY     left  . SHOULDER SURGERY    . TONSILLECTOMY     Current Outpatient Prescriptions on File Prior to Visit  Medication Sig Dispense Refill  . acyclovir (ZOVIRAX) 400 MG tablet Take 1 tablet (400 mg total) by mouth 3 (three)  times daily. (Patient taking differently: Take 400 mg by mouth 3 (three) times daily as needed. ) 30 tablet 0  . ALPRAZolam (XANAX) 0.5 MG tablet Take 1 tablet (0.5 mg total) by mouth at bedtime as needed. 60 tablet 1  . aspirin 81 MG tablet Take 81 mg by mouth daily.    . calcium carbonate (OS-CAL) 600 MG TABS Take 600 mg by mouth daily.     . cholecalciferol (VITAMIN D) 1000 UNITS tablet Take 1,000 Units by mouth daily.    . ciprofloxacin (CIPRO) 500 MG tablet Take 1 tablet (500 mg total) by mouth 2 (two) times daily. 10 tablet 0  . Multiple Vitamins-Minerals (ICAPS MV PO) Take 1 capsule by mouth daily.     Marland Kitchen oxyCODONE-acetaminophen (ROXICET) 5-325 MG tablet Take 1 tablet by  mouth every 8 (eight) hours as needed for severe pain. 30 tablet 0  . pantoprazole (PROTONIX) 40 MG tablet Take 1 tablet (40 mg total) by mouth daily. 90 tablet 3  . simvastatin (ZOCOR) 40 MG tablet Take 1 tablet (40 mg total) by mouth daily. 90 tablet 3  . zinc sulfate 220 MG capsule Take 220 mg by mouth daily.       No current facility-administered medications on file prior to visit.    Allergies  Allergen Reactions  . Aspirin Other (See Comments)    Pt has Von Willebrands Disease Pt has [place]  . Tape Dermatitis   Social History   Social History  . Marital status: Widowed    Spouse name: N/A  . Number of children: N/A  . Years of education: N/A   Occupational History  . Not on file.   Social History Main Topics  . Smoking status: Never Smoker  . Smokeless tobacco: Never Used  . Alcohol use No  . Drug use: No  . Sexual activity: Not on file     Comment: husband recently died from myelodysplastic syndrome in January of 2014   Other Topics Concern  . Not on file   Social History Narrative  . No narrative on file   Family History  Problem Relation Age of Onset  . Breast cancer Mother 57       now 17yo  . Stroke Father        Father had no full sisters  . Breast cancer Sister 21       now 19yo; had TAH/BSO  . Lung cancer Brother        died 21yo; smoker   Immunization History  Administered Date(s) Administered  . Influenza Split 01/19/2014  . Influenza Whole 03/07/2009, 03/06/2010  . Influenza,inj,Quad PF,36+ Mos 01/26/2013, 02/17/2015  . Influenza-Unspecified 03/09/2016  . Pneumococcal Conjugate-13 10/22/2014  . Pneumococcal Polysaccharide-23 09/07/2010, 03/23/2016  . Td 12/25/2007  . Tdap 12/25/2007  . Zoster 03/30/2010   Mammogram is up-to-date. Colonoscopy is up-to-date.  Does not require a Pap smear given the fact she's had a hysterectomy.    Review of Systems  All other systems reviewed and are negative.      Objective:    Physical Exam  Constitutional: She is oriented to person, place, and time. She appears well-developed and well-nourished. No distress.  HENT:  Head: Normocephalic and atraumatic.  Right Ear: External ear normal.  Left Ear: External ear normal.  Nose: Nose normal.  Mouth/Throat: Oropharynx is clear and moist. No oropharyngeal exudate.  Eyes: Conjunctivae and EOM are normal. Pupils are equal, round, and reactive to light. Right eye exhibits no discharge.  Left eye exhibits no discharge. No scleral icterus.  Neck: Normal range of motion. Neck supple. No JVD present. No tracheal deviation present. No thyromegaly present.  Cardiovascular: Normal rate, regular rhythm, normal heart sounds and intact distal pulses.  Exam reveals no gallop and no friction rub.   No murmur heard. Pulmonary/Chest: Effort normal and breath sounds normal. No stridor. No respiratory distress. She has no wheezes. She has no rales. She exhibits no tenderness.  Abdominal: Soft. Bowel sounds are normal. She exhibits no distension. There is no tenderness. There is no rebound and no guarding.  Musculoskeletal: Normal range of motion. She exhibits no edema or tenderness.  Lymphadenopathy:    She has no cervical adenopathy.  Neurological: She is alert and oriented to person, place, and time. She displays normal reflexes. No cranial nerve deficit. She exhibits normal muscle tone. Coordination normal.  Skin: Skin is warm. No rash noted. She is not diaphoretic. No erythema. No pallor.  Psychiatric: She has a normal mood and affect. Her behavior is normal. Judgment and thought content normal.  Vitals reviewed.         Assessment & Plan:  Pure hypercholesterolemia - Plan: CBC with Differential/Platelet, COMPLETE METABOLIC PANEL WITH GFR, Lipid panel  Recheck fasting lipid panel. Goal LDL cholesterol is less than 70 given her history of TIA. If LDL is not at goal, I will discontinue simvastatin and replace with Crestor 40 mg a day.  A PK S9 inhibitor would also be an option albeit expensive one if necessary

## 2016-10-10 ENCOUNTER — Encounter: Payer: Self-pay | Admitting: Family Medicine

## 2016-10-10 MED ORDER — ROSUVASTATIN CALCIUM 20 MG PO TABS: 20.0000 mg | ORAL_TABLET | Freq: Every day | ORAL | 3 refills | Status: DC

## 2016-10-10 NOTE — Telephone Encounter (Signed)
Medication called/sent to requested pharmacy  

## 2016-11-16 ENCOUNTER — Other Ambulatory Visit: Payer: Self-pay | Admitting: Family Medicine

## 2017-01-01 ENCOUNTER — Ambulatory Visit (HOSPITAL_COMMUNITY): Payer: Medicare Other

## 2017-01-15 ENCOUNTER — Encounter: Payer: Self-pay | Admitting: Family Medicine

## 2017-01-15 ENCOUNTER — Ambulatory Visit (INDEPENDENT_AMBULATORY_CARE_PROVIDER_SITE_OTHER): Payer: Medicare Other | Admitting: Family Medicine

## 2017-01-15 VITALS — BP 112/68 | HR 64 | Temp 98.1°F | Resp 14 | Ht 65.5 in | Wt 134.0 lb

## 2017-01-15 DIAGNOSIS — Z8673 Personal history of transient ischemic attack (TIA), and cerebral infarction without residual deficits: Secondary | ICD-10-CM | POA: Diagnosis not present

## 2017-01-15 DIAGNOSIS — E7800 Pure hypercholesterolemia, unspecified: Secondary | ICD-10-CM

## 2017-01-15 NOTE — Progress Notes (Signed)
Subjective:    Patient ID: Caroline Valdez, female    DOB: January 11, 1951, 66 y.o.   MRN: [id number]  Medication Refill    Patient is a very pleasant 66 year old white female who has a history of breast cancer in the left breast and also a history of a TIA who presents today for follow up.At her last office visit in May, her LDL cholesterol was 111. Her goal LDL cholesterol is less than 70 given her history of TIA. Therefore we discontinued simvastatin and replaced it with Crestor. She denies any myalgias. She denies any right upper quadrant pain. She is here today to recheck her cholesterol. Since her last visit, her mother was admitted to the hospital with myocardial infarction.  Past Medical History:  Diagnosis Date  . Breast cancer ([place])    left  . 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 Endometriosis   . Fibromyalgia   . GERD (gastroesophageal reflux disease)   . Hypercholesteremia   . Mini stroke ([place]) 09/2012  . Radiculopathy   . Von Willebrand disease ([place]) 09/11/2013  . Von Willebrand's disease [facility])    history of   Past Surgical History:  Procedure Laterality Date  . ABDOMINAL HYSTERECTOMY    . BACK SURGERY    . BREAST RECONSTRUCTION    . CARPAL TUNNEL RELEASE     right  . HEMORRHOID SURGERY    . LAMINECTOMY    . MASTECTOMY     left  . SHOULDER SURGERY    . TONSILLECTOMY     Current Outpatient Prescriptions on File Prior to Visit  Medication Sig Dispense Refill  . acyclovir (ZOVIRAX) 400 MG tablet Take 1 tablet (400 mg total) by mouth 3 (three) times daily. (Patient taking differently: Take 400 mg by mouth 3 (three) times daily as needed. ) 30 tablet 0  . ALPRAZolam (XANAX) 0.5 MG tablet Take 1 tablet (0.5 mg total) by mouth at bedtime as needed. 60 tablet 1  . aspirin 81 MG tablet Take 81 mg by mouth daily.    . calcium carbonate (OS-CAL) 600 MG TABS Take 600 mg by mouth daily.     . cholecalciferol (VITAMIN D) 1000 UNITS tablet Take 1,000 Units by mouth  daily.    . Multiple Vitamins-Minerals (ICAPS MV PO) Take 1 capsule by mouth daily.     . pantoprazole (PROTONIX) 40 MG tablet TAKE 1 TABLET DAILY 90 tablet 3  . rosuvastatin (CRESTOR) 20 MG tablet Take 1 tablet (20 mg total) by mouth daily. 90 tablet 3  . zinc sulfate 220 MG capsule Take 220 mg by mouth daily.       No current facility-administered medications on file prior to visit.    Allergies  Allergen Reactions  . Aspirin Other (See Comments)    Pt has Von Willebrands Disease Pt has Rosalie  . Tape Dermatitis   Social History   Social History  . Marital status: Widowed    Spouse name: N/A  . Number of children: N/A  . Years of education: N/A   Occupational History  . Not on file.   Social History Main Topics  . Smoking status: Never Smoker  . Smokeless tobacco: Never Used  . Alcohol use No  . Drug use: No  . Sexual activity: Not on file     Comment: husband recently died from myelodysplastic syndrome in January of 2014   Other Topics Concern  . Not on file   Social History Narrative  .  No narrative on file   Family History  Problem Relation Age of Onset  . Breast cancer Mother 80       now 93yo  . Stroke Father        Father had no full sisters  . Breast cancer Sister 7       now 33yo; had TAH/BSO  . Lung cancer Brother        died 65yo; smoker   Immunization History  Administered Date(s) Administered  . Influenza Split 01/19/2014  . Influenza Whole 03/07/2009, 03/06/2010  . Influenza,inj,Quad PF,6+ Mos 01/26/2013, 02/17/2015  . Influenza-Unspecified 03/09/2016  . Pneumococcal Conjugate-13 10/22/2014  . Pneumococcal Polysaccharide-23 09/07/2010, 03/23/2016  . Td 12/25/2007  . Tdap 12/25/2007  . Zoster 03/30/2010   Mammogram is up-to-date. Colonoscopy is up-to-date.  Does not require a Pap smear given the fact she's had a hysterectomy.    Review of Systems  All other systems reviewed and are negative.      Objective:    Physical Exam  Constitutional: She is oriented to person, place, and time. She appears well-developed and well-nourished. No distress.  HENT:  Head: Normocephalic and atraumatic.  Right Ear: External ear normal.  Left Ear: External ear normal.  Nose: Nose normal.  Mouth/Throat: Oropharynx is clear and moist. No oropharyngeal exudate.  Eyes: Pupils are equal, round, and reactive to light. Conjunctivae and EOM are normal. Right eye exhibits no discharge. Left eye exhibits no discharge. No scleral icterus.  Neck: Normal range of motion. Neck supple. No JVD present. No tracheal deviation present. No thyromegaly present.  Cardiovascular: Normal rate, regular rhythm, normal heart sounds and intact distal pulses.  Exam reveals no gallop and no friction rub.   No murmur heard. Pulmonary/Chest: Effort normal and breath sounds normal. No stridor. No respiratory distress. She has no wheezes. She has no rales. She exhibits no tenderness.  Abdominal: Soft. Bowel sounds are normal. She exhibits no distension. There is no tenderness. There is no rebound and no guarding.  Musculoskeletal: Normal range of motion. She exhibits no edema or tenderness.  Lymphadenopathy:    She has no cervical adenopathy.  Neurological: She is alert and oriented to person, place, and time. She displays normal reflexes. No cranial nerve deficit. She exhibits normal muscle tone. Coordination normal.  Skin: Skin is warm. No rash noted. She is not diaphoretic. No erythema. No pallor.  Psychiatric: She has a normal mood and affect. Her behavior is normal. Judgment and thought content normal.  Vitals reviewed.         Assessment & Plan:  Pure hypercholesterolemia - Plan: COMPLETE METABOLIC PANEL WITH GFR, Lipid panel  History of TIA (transient ischemic attack) - Plan: COMPLETE METABOLIC PANEL WITH GFR, Lipid panel  Recheck fasting lipid panel. Goal LDL cholesterol is less than 70 given her history of TIA. If LDL is not at goal,  we could consider praluent although cost may outweigh benefits.

## 2017-01-16 LAB — COMPLETE METABOLIC PANEL WITH GFR
ALBUMIN: 4.5 g/dL (ref 3.6–5.1)
ALT: 14 U/L (ref 6–29)
AST: 22 U/L (ref 10–35)
Alkaline Phosphatase: 54 U/L (ref 33–130)
BILIRUBIN TOTAL: 0.8 mg/dL (ref 0.2–1.2)
BUN: 13 mg/dL (ref 7–25)
CO2: 24 mmol/L (ref 20–32)
Calcium: 9.2 mg/dL (ref 8.6–10.4)
Chloride: 105 mmol/L (ref 98–110)
Creat: 0.89 mg/dL (ref 0.50–0.99)
GFR, Est African American: 78 mL/min (ref >=60)
GFR, Est Non African American: 67 mL/min (ref >=60)
GLUCOSE: 88 mg/dL (ref 70–99)
Potassium: 3.8 mmol/L (ref 3.5–5.3)
SODIUM: 142 mmol/L (ref 135–146)
TOTAL PROTEIN: 6.8 g/dL (ref 6.1–8.1)

## 2017-01-16 LAB — LIPID PANEL
Cholesterol: 170 mg/dL (ref <200)
HDL: 62 mg/dL (ref >50)
LDL Cholesterol: 84 mg/dL (ref <100)
TRIGLYCERIDES: 119 mg/dL (ref <150)
Total CHOL/HDL Ratio: 2.7 Ratio (ref <5.0)
VLDL: 24 mg/dL (ref <30)

## 2017-01-17 ENCOUNTER — Encounter: Payer: Self-pay | Admitting: Family Medicine

## 2017-01-28 ENCOUNTER — Ambulatory Visit (INDEPENDENT_AMBULATORY_CARE_PROVIDER_SITE_OTHER): Payer: Medicare Other | Admitting: *Deleted

## 2017-01-28 DIAGNOSIS — Z23 Encounter for immunization: Secondary | ICD-10-CM

## 2017-01-28 NOTE — Progress Notes (Signed)
Patient seen in office for Influenza Vaccination.   Tolerated IM administration well.   Immunization history updated.  

## 2017-02-25 DIAGNOSIS — L570 Actinic keratosis: Secondary | ICD-10-CM | POA: Diagnosis not present

## 2017-02-25 DIAGNOSIS — D1800 Hemangioma unspecified site: Secondary | ICD-10-CM | POA: Diagnosis not present

## 2017-02-25 DIAGNOSIS — Z85828 Personal history of other malignant neoplasm of skin: Secondary | ICD-10-CM | POA: Diagnosis not present

## 2017-03-15 ENCOUNTER — Encounter: Payer: Self-pay | Admitting: Family Medicine

## 2017-03-15 ENCOUNTER — Ambulatory Visit (INDEPENDENT_AMBULATORY_CARE_PROVIDER_SITE_OTHER): Payer: Medicare Other | Admitting: Family Medicine

## 2017-03-15 VITALS — BP 140/80 | HR 94 | Temp 97.6°F | Resp 14 | Ht 65.5 in | Wt 137.0 lb

## 2017-03-15 DIAGNOSIS — J029 Acute pharyngitis, unspecified: Secondary | ICD-10-CM | POA: Diagnosis not present

## 2017-03-15 MED ORDER — PREDNISONE 20 MG PO TABS: ORAL_TABLET | ORAL | 0 refills | Status: DC

## 2017-03-15 NOTE — Progress Notes (Signed)
Subjective:    Patient ID: Caroline Valdez, female    DOB: 03/26/1951, 66 y.o.   MRN: [id number]  HPI  Patient has been dealing with a sore throat now for more than a month.  The sore throat is mild but it is not getting better.  Sometimes it will even wake her up at night.  She denies any fever.  She denies any rhinorrhea.  She denies any postnasal.  She denies any cough.  She does have a history of allergies around this time a year but she try over-the-counter allergy medicine without relief.  She also has a history of reflux but she is compliant taking her PPI on a daily basis.  She denies any breakthrough reflux recently.  She does report a cough when she lays down at night however.  She denies any hemoptysis.  She denies any hematemesis.  She denies any dysphasia. Past Medical History:  Diagnosis Date  . Breast cancer ([place])    left  . 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 Endometriosis   . Fibromyalgia   . GERD (gastroesophageal reflux disease)   . Hypercholesteremia   . Mini stroke ([place]) 09/2012  . Radiculopathy   . Von Willebrand disease ([place]) 09/11/2013  . Von Willebrand's disease [facility])    history of   Past Surgical History:  Procedure Laterality Date  . ABDOMINAL HYSTERECTOMY    . BACK SURGERY    . BREAST RECONSTRUCTION    . CARPAL TUNNEL RELEASE     right  . HEMORRHOID SURGERY    . LAMINECTOMY    . MASTECTOMY     left  . SHOULDER SURGERY    . TONSILLECTOMY     Current Outpatient Prescriptions on File Prior to Visit  Medication Sig Dispense Refill  . acyclovir (ZOVIRAX) 400 MG tablet Take 1 tablet (400 mg total) by mouth 3 (three) times daily. (Patient taking differently: Take 400 mg by mouth 3 (three) times daily as needed. ) 30 tablet 0  . ALPRAZolam (XANAX) 0.5 MG tablet Take 1 tablet (0.5 mg total) by mouth at bedtime as needed. 60 tablet 1  . aspirin 81 MG tablet Take 81 mg by mouth daily.    . calcium carbonate (OS-CAL) 600 MG TABS Take 600 mg by  mouth daily.     . cholecalciferol (VITAMIN D) 1000 UNITS tablet Take 1,000 Units by mouth daily.    . Multiple Vitamins-Minerals (ICAPS MV PO) Take 1 capsule by mouth daily.     . pantoprazole (PROTONIX) 40 MG tablet TAKE 1 TABLET DAILY 90 tablet 3  . rosuvastatin (CRESTOR) 20 MG tablet Take 1 tablet (20 mg total) by mouth daily. 90 tablet 3  . zinc sulfate 220 MG capsule Take 220 mg by mouth daily.       No current facility-administered medications on file prior to visit.    Allergies  Allergen Reactions  . Aspirin Other (See Comments)    Pt has Von Willebrands Disease Pt has [place]  . Tape Dermatitis   Social History   Social History  . Marital status: Widowed    Spouse name: N/A  . Number of children: N/A  . Years of education: N/A   Occupational History  . Not on file.   Social History Main Topics  . Smoking status: Never Smoker  . Smokeless tobacco: Never Used  . Alcohol use No  . Drug use: No  . Sexual activity: Not on file  Comment: husband recently died from myelodysplastic syndrome in January of 2014   Other Topics Concern  . Not on file   Social History Narrative  . No narrative on file     Review of Systems  All other systems reviewed and are negative.      Objective:   Physical Exam  HENT:  Right Ear: External ear normal.  Left Ear: External ear normal.  Nose: Nose normal.  Mouth/Throat: Oropharynx is clear and moist. No oropharyngeal exudate.  Neck: Neck supple. No thyromegaly present.  Cardiovascular: Normal rate, regular rhythm and normal heart sounds.   Pulmonary/Chest: Effort normal and breath sounds normal. No stridor. No respiratory distress. She has no wheezes. She has no rales.  Lymphadenopathy:    She has no cervical adenopathy.  Vitals reviewed.         Assessment & Plan:  Sore throat - Plan: predniSONE (DELTASONE) 20 MG tablet, Culture, Group A Strep  Her exam today is unremarkable and provides no clues  to her source.  I will send a throat culture to rule out infection although I believe this is highly unlikely.  Most likely cause of her chronic sore throat is either acid reflux or postnasal drip secondary to allergies.  Given her previous history of allergies as well as the fact that she is already on a daily PPI, I will give the patient a prednisone taper pack to see if the sore throat improves.  If not, I will increase her PPI to twice daily and if this does not help, I will consult ENT for laryngoscopy.  I will await the patient's call next week for an update

## 2017-03-17 LAB — CULTURE, GROUP A STREP
MICRO NUMBER: 81203521
SPECIMEN QUALITY:: ADEQUATE

## 2017-03-19 ENCOUNTER — Telehealth: Payer: Self-pay | Admitting: Family Medicine

## 2017-03-19 NOTE — Telephone Encounter (Signed)
Patient aware of providers recommendations.  

## 2017-03-19 NOTE — Telephone Encounter (Signed)
Pt called [facility] that her throat is no better and she is on her past day of prednisone and you wanted her to call and let you know how she was doing.   CB# [phone]

## 2017-03-19 NOTE — Telephone Encounter (Signed)
Increase her ppi to twice a day.  Add zantac 150 mg poqhs too.  Recheck in 1-2 weeks and if no better then, I would consult ENT.

## 2017-03-25 ENCOUNTER — Encounter: Payer: Medicare Other | Admitting: Family Medicine

## 2017-04-01 ENCOUNTER — Other Ambulatory Visit: Payer: Self-pay | Admitting: Family Medicine

## 2017-04-01 DIAGNOSIS — J312 Chronic pharyngitis: Secondary | ICD-10-CM

## 2017-05-17 ENCOUNTER — Ambulatory Visit (INDEPENDENT_AMBULATORY_CARE_PROVIDER_SITE_OTHER): Payer: Medicare Other | Admitting: Family Medicine

## 2017-05-17 ENCOUNTER — Other Ambulatory Visit: Payer: Self-pay

## 2017-05-17 ENCOUNTER — Encounter: Payer: Self-pay | Admitting: Family Medicine

## 2017-05-17 VITALS — BP 132/64 | HR 96 | Temp 98.4°F | Resp 18 | Ht 65.5 in | Wt 158.0 lb

## 2017-05-17 DIAGNOSIS — B9689 Other specified bacterial agents as the cause of diseases classified elsewhere: Secondary | ICD-10-CM | POA: Diagnosis not present

## 2017-05-17 DIAGNOSIS — J019 Acute sinusitis, unspecified: Secondary | ICD-10-CM

## 2017-05-17 MED ORDER — AMOXICILLIN 875 MG PO TABS: 875.0000 mg | ORAL_TABLET | Freq: Two times a day (BID) | ORAL | 0 refills | Status: DC

## 2017-05-17 NOTE — Progress Notes (Signed)
Subjective:     Patient ID: Caroline Valdez, female   DOB: 10-27-1950, 66 y.o.   MRN: [id number]  HPI  Patient reports a one-week history of severe pain in her right frontal and right maxillary sinus.  She reports nasal congestion, postnasal drip, pain in her right cheek, and a dull constant pressure-like headache.  She reports subjective fevers and rhinorrhea. Past Medical History:  Diagnosis Date  . Breast cancer ([place])    left  . 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 Endometriosis   . Fibromyalgia   . GERD (gastroesophageal reflux disease)   . Hypercholesteremia   . Mini stroke ([place]) 09/2012  . Radiculopathy   . Von Willebrand disease ([place]) 09/11/2013  . Von Willebrand's disease [facility])    history of   Past Surgical History:  Procedure Laterality Date  . ABDOMINAL HYSTERECTOMY    . BACK SURGERY    . BREAST RECONSTRUCTION    . CARPAL TUNNEL RELEASE     right  . HEMORRHOID SURGERY    . LAMINECTOMY    . MASTECTOMY     left  . SHOULDER SURGERY    . TONSILLECTOMY     Current Outpatient Medications on File Prior to Visit  Medication Sig Dispense Refill  . acyclovir (ZOVIRAX) 400 MG tablet Take 1 tablet (400 mg total) by mouth 3 (three) times daily. (Patient taking differently: Take 400 mg by mouth 3 (three) times daily as needed. ) 30 tablet 0  . ALPRAZolam (XANAX) 0.5 MG tablet Take 1 tablet (0.5 mg total) by mouth at bedtime as needed. 60 tablet 1  . aspirin 81 MG tablet Take 81 mg by mouth daily.    . calcium carbonate (OS-CAL) 600 MG TABS Take 600 mg by mouth daily.     . cholecalciferol (VITAMIN D) 1000 UNITS tablet Take 1,000 Units by mouth daily.    . Multiple Vitamins-Minerals (ICAPS MV PO) Take 1 capsule by mouth daily.     . pantoprazole (PROTONIX) 40 MG tablet TAKE 1 TABLET DAILY 90 tablet 3  . rosuvastatin (CRESTOR) 20 MG tablet Take 1 tablet (20 mg total) by mouth daily. 90 tablet 3  . zinc sulfate 220 MG capsule Take 220 mg by mouth daily.       No  current facility-administered medications on file prior to visit.    Allergies  Allergen Reactions  . Aspirin Other (See Comments)    Pt has Von Willebrands Disease Pt has [place]  . Tape Dermatitis   Social History   Socioeconomic History  . Marital status: Widowed    Spouse name: Not on file  . Number of children: Not on file  . Years of education: Not on file  . Highest education level: Not on file  Social Needs  . Financial resource strain: Not on file  . Food insecurity - worry: Not on file  . Food insecurity - inability: Not on file  . Transportation needs - medical: Not on file  . Transportation needs - non-medical: Not on file  Occupational History  . Not on file  Tobacco Use  . Smoking status: Never Smoker  . Smokeless tobacco: Never Used  Substance and Sexual Activity  . Alcohol use: No  . Drug use: No  . Sexual activity: Not on file    Comment: husband recently died from myelodysplastic syndrome in January of 2014  Other Topics Concern  . Not on file  Social History Narrative  . Not  on file    Review of Systems  All other systems reviewed and are negative.      Objective:   Physical Exam  HENT:  Right Ear: Tympanic membrane and ear canal normal.  Left Ear: Tympanic membrane and ear canal normal.  Nose: Mucosal edema and rhinorrhea present. Right sinus exhibits maxillary sinus tenderness and frontal sinus tenderness.  Neck: Neck supple.  Cardiovascular: Normal rate, regular rhythm and normal heart sounds.  Pulmonary/Chest: Effort normal and breath sounds normal. No respiratory distress. She has no wheezes. She has no rales. She exhibits no tenderness.  Lymphadenopathy:    She has no cervical adenopathy.  Vitals reviewed.      Assessment:     Acute bacterial rhinosinusitis  Plan:     Begin amoxicillin 875 mg p.o. twice daily for 10 days and Flonase 2 sprays each nostril daily

## 2017-05-20 ENCOUNTER — Ambulatory Visit (INDEPENDENT_AMBULATORY_CARE_PROVIDER_SITE_OTHER): Payer: Medicare Other | Admitting: Otolaryngology

## 2017-05-20 DIAGNOSIS — K219 Gastro-esophageal reflux disease without esophagitis: Secondary | ICD-10-CM | POA: Diagnosis not present

## 2017-05-20 DIAGNOSIS — R070 Pain in throat: Secondary | ICD-10-CM | POA: Diagnosis not present

## 2017-05-20 DIAGNOSIS — H6122 Impacted cerumen, left ear: Secondary | ICD-10-CM

## 2017-07-01 ENCOUNTER — Ambulatory Visit (INDEPENDENT_AMBULATORY_CARE_PROVIDER_SITE_OTHER): Payer: Medicare Other | Admitting: Otolaryngology

## 2017-07-01 DIAGNOSIS — K219 Gastro-esophageal reflux disease without esophagitis: Secondary | ICD-10-CM | POA: Diagnosis not present

## 2017-07-01 DIAGNOSIS — R070 Pain in throat: Secondary | ICD-10-CM | POA: Diagnosis not present

## 2017-07-02 ENCOUNTER — Encounter (HOSPITAL_COMMUNITY): Payer: Self-pay | Admitting: Internal Medicine

## 2017-07-02 ENCOUNTER — Inpatient Hospital Stay (HOSPITAL_COMMUNITY): Payer: Medicare Other | Attending: Internal Medicine | Admitting: Internal Medicine

## 2017-07-02 ENCOUNTER — Other Ambulatory Visit: Payer: Self-pay

## 2017-07-02 VITALS — BP 118/64 | HR 78 | Temp 98.0°F | Resp 16 | Ht 66.0 in | Wt 134.0 lb

## 2017-07-02 DIAGNOSIS — M797 Fibromyalgia: Secondary | ICD-10-CM | POA: Diagnosis not present

## 2017-07-02 DIAGNOSIS — Z7982 Long term (current) use of aspirin: Secondary | ICD-10-CM | POA: Diagnosis not present

## 2017-07-02 DIAGNOSIS — K219 Gastro-esophageal reflux disease without esophagitis: Secondary | ICD-10-CM | POA: Diagnosis not present

## 2017-07-02 DIAGNOSIS — M810 Age-related osteoporosis without current pathological fracture: Secondary | ICD-10-CM

## 2017-07-02 DIAGNOSIS — Z170 Estrogen receptor positive status [ER+]: Secondary | ICD-10-CM

## 2017-07-02 DIAGNOSIS — Z853 Personal history of malignant neoplasm of breast: Secondary | ICD-10-CM | POA: Diagnosis not present

## 2017-07-02 DIAGNOSIS — Z9012 Acquired absence of left breast and nipple: Secondary | ICD-10-CM | POA: Insufficient documentation

## 2017-07-02 DIAGNOSIS — Z79899 Other long term (current) drug therapy: Secondary | ICD-10-CM | POA: Diagnosis not present

## 2017-07-02 DIAGNOSIS — C50112 Malignant neoplasm of central portion of left female breast: Secondary | ICD-10-CM

## 2017-07-02 DIAGNOSIS — E7800 Pure hypercholesterolemia, unspecified: Secondary | ICD-10-CM

## 2017-07-02 DIAGNOSIS — D680 Von Willebrand's disease: Secondary | ICD-10-CM | POA: Diagnosis not present

## 2017-07-02 DIAGNOSIS — Z8673 Personal history of transient ischemic attack (TIA), and cerebral infarction without residual deficits: Secondary | ICD-10-CM | POA: Diagnosis not present

## 2017-07-02 DIAGNOSIS — Z90710 Acquired absence of both cervix and uterus: Secondary | ICD-10-CM | POA: Insufficient documentation

## 2017-07-02 DIAGNOSIS — M5410 Radiculopathy, site unspecified: Secondary | ICD-10-CM | POA: Diagnosis not present

## 2017-07-02 NOTE — Patient Instructions (Signed)
[place] at [facility] Discharge Instructions  RECOMMENDATIONS MADE BY THE CONSULTANT AND ANY TEST RESULTS WILL BE SENT TO YOUR REFERRING PHYSICIAN.  You were seen today by Dr. Mathis Dad Higgs Please talk with your primary care physician about your bone density scan and getting treatment for your osteoporosis.   We will not make any further appointments, you can call us if you need anything in the future.    Thank you for choosing [place] at [facility] to provide your oncology and hematology care.  To afford each patient quality time with our provider, please arrive at least 15 minutes before your scheduled appointment time.    If you have a lab appointment with the [facility] please come in thru the  Main Entrance and check in at the main information desk  You need to re-schedule your appointment should you arrive 10 or more minutes late.  We strive to give you quality time with our providers, and arriving late affects you and other patients whose appointments are after yours.  Also, if you no show three or more times for appointments you may be dismissed from the clinic at the providers discretion.     Again, thank you for choosing [facility].  Our hope is that these requests will decrease the amount of time that you wait before being seen by our physicians.       _____________________________________________________________  Should you have questions after your visit to [facility], please contact our office at [phone] between the hours of 8:30 a.m. and 4:30 p.m.  Voicemails left after 4:30 p.m. will not be returned until the following business day.  For prescription refill requests, have your pharmacy contact our office.       Resources For Cancer Patients and their Caregivers ? American Cancer Society: Can assist with transportation, wigs, general needs, runs Look Good Feel Better.         (438)409-8294 ? Cancer Care: Provides financial assistance, online support groups, medication/co-pay assistance.  1-800-813-HOPE (204)540-7036) ? [place] Assists [place] cancer patients and their families through emotional , educational and financial support.  772-846-7845 ? [place] DSS Where to apply for food stamps, Medicaid and utility assistance. 207 674 6968 ? [facility]: Transportation to medical appointments. 8080690503 ? Social Security Administration: May apply for disability if have a Stage IV cancer. 343-210-9599 314-117-6115 ? [employer], Disability and Transit Services: Assists with nutrition, care and transit needs. [place] Support Programs: @10RELATIVEDAYS @ > Cancer Support Group  2nd Tuesday of the month 1pm-2pm, Journey Room  > Creative Journey  3rd Tuesday of the month 1130am-1pm, Journey Room  > Look Good Feel Better  1st Wednesday of the month 10am-12 noon, Journey Room (Call [place] to register [phone])

## 2017-07-08 NOTE — Progress Notes (Signed)
Left mastectomy and reconstruction together with right breast reduction and implant, no evidence of disease. Surgery was performed in December 2010. DCIS with questionable microinvasion.  ER+, PR-. Tumor size approximately 2 cm Patient did not pursue endocrine therapy Von Willebrand's disease, asymptomatic. Evaluated by [name] in the past.  Osteoporosis   Staging Cancer Staging No matching staging information was found for the patient.  Assessment and Plan:  1.  Noninvasive ductal neoplasia the left breast, microinvasion, status post left mastectomy and reconstruction together with right breast reduction and implant, no evidence of disease. Surgery was performed in December 2010.  DCIS with questionable microinvasion.  Er+, PR-. Tumor size approximately 2 cm  Patient did not pursue endocrine therapy.  She is greater than 5 years out from diagnosis and approaching 10 years.  She is advised to continue yearly mammograms as directed.  She is given the option of prn followup and would like to be seen as needed.  Nursing will obtain recent mammogram reports to chart.    2.  Von Willebrand's disease, asymptomatic. Evaluated by [name] in the past. She is advised we are happy to see her back if need arises.  She has intermittent use of DDAVP prior to surgical interventions for von Willebrand's disease  3.  Osteoporosis.  She is advised to followup with PCP for followup and ongoing monitoring.    INTERVAL HISTORY: Caroline Valdez 67 y.o. female returns for follow-up of her breast cancer diagnosed in 2010. She has no major complaints today. She is doing well. She denies any bleeding or bruising. Her energy is good. She describes her appetite is unchanged.   Current status:  Pt is seen today for followup.  She reports she had recent mammogram.    Problem List Patient Active Problem List   Diagnosis Date Noted  . Osteoporosis [M81.0] 07/14/2014  . Von Willebrand disease ([place]) [D68.0]  09/11/2013  . Hypercholesteremia [E78.00]   . Breast cancer ([place]) [C50.919]   . Radiculopathy [M54.10] 11/08/2010  . Fibromyalgia [M79.7] 11/08/2010  . Endometriosis [617] 11/08/2010    Past Medical History Past Medical History:  Diagnosis Date  . Breast cancer ([place])    left  . 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 Endometriosis   . Fibromyalgia   . GERD (gastroesophageal reflux disease)   . Hypercholesteremia   . Mini stroke ([place]) 09/2012  . Radiculopathy   . Von Willebrand disease ([place]) 09/11/2013  . Von Willebrand's disease [facility])    history of    Past Surgical History Past Surgical History:  Procedure Laterality Date  . ABDOMINAL HYSTERECTOMY    . BACK SURGERY    . BREAST RECONSTRUCTION    . CARPAL TUNNEL RELEASE     right  . HEMORRHOID SURGERY    . LAMINECTOMY    . MASTECTOMY     left  . SHOULDER SURGERY    . TONSILLECTOMY      Family History Family History  Problem Relation Age of Onset  . Breast cancer Mother 73       now 45yo  . Stroke Father        Father had no full sisters  . Breast cancer Sister 8       now 50yo; had TAH/BSO  . Lung cancer Brother        died 63yo; smoker     Social History  reports that  has never smoked. she has never used smokeless tobacco. She reports that she  does not drink alcohol or use drugs.  Medications  Current Outpatient Medications:  .  acyclovir (ZOVIRAX) 400 MG tablet, Take 1 tablet (400 mg total) by mouth 3 (three) times daily. (Patient taking differently: Take 400 mg by mouth 3 (three) times daily as needed. ), Disp: 30 tablet, Rfl: 0 .  ALPRAZolam (XANAX) 0.5 MG tablet, Take 1 tablet (0.5 mg total) by mouth at bedtime as needed., Disp: 60 tablet, Rfl: 1 .  amoxicillin (AMOXIL) 875 MG tablet, Take 1 tablet (875 mg total) by mouth 2 (two) times daily., Disp: 20 tablet, Rfl: 0 .  aspirin 81 MG tablet, Take 81 mg by mouth daily., Disp: , Rfl:  .  calcium carbonate (OS-CAL) 600 MG TABS, Take 600 mg by  mouth daily. , Disp: , Rfl:  .  cholecalciferol (VITAMIN D) 1000 UNITS tablet, Take 1,000 Units by mouth daily., Disp: , Rfl:  .  Multiple Vitamins-Minerals (ICAPS MV PO), Take 1 capsule by mouth daily. , Disp: , Rfl:  .  pantoprazole (PROTONIX) 40 MG tablet, TAKE 1 TABLET DAILY, Disp: 90 tablet, Rfl: 3 .  ranitidine (ZANTAC) 150 MG tablet, Take 150 mg by mouth 2 (two) times daily., Disp: , Rfl:  .  rosuvastatin (CRESTOR) 20 MG tablet, Take 1 tablet (20 mg total) by mouth daily., Disp: 90 tablet, Rfl: 3 .  zinc sulfate 220 MG capsule, Take 220 mg by mouth daily.  , Disp: , Rfl:   Allergies Aspirin and Tape  Review of Systems Review of Systems - Oncology ROS as per HPI otherwise 12 point ROS is negative.   Physical Exam  Vitals Wt Readings from Last 3 Encounters:  07/02/17 134 lb (60.8 kg)  05/17/17 158 lb (71.7 kg)  03/15/17 137 lb (62.1 kg)   Temp Readings from Last 3 Encounters:  07/02/17 98 F (36.7 C) (Oral)  05/17/17 98.4 F (36.9 C) (Oral)  03/15/17 97.6 F (36.4 C) (Oral)   BP Readings from Last 3 Encounters:  07/02/17 118/64  05/17/17 132/64  03/15/17 140/80   Pulse Readings from Last 3 Encounters:  07/02/17 78  05/17/17 96  03/15/17 94   Constitutional: Well-developed, well-nourished, and in no distress.   HENT:  Head: Normocephalic and atraumatic.  Mouth/Throat: No oropharyngeal exudate. Mucosa moist. Eyes: Pupils are equal, round, and reactive to light. Conjunctivae are normal. No scleral icterus.  Neck: Normal range of motion. Neck supple. No JVD present.  Cardiovascular: Normal rate, regular rhythm and normal heart sounds.  Exam reveals no gallop and no friction rub.   No murmur heard. Bilateral mastectomy:  Chaperone present.  Left mastectomy with reconstruction.  Right breast surgical changes noted.   Pulmonary/Chest: Effort normal and breath sounds normal. No respiratory distress. No wheezes.No rales.  Abdominal: Soft. Bowel sounds are normal. No  distension. There is no tenderness. There is no guarding.  Musculoskeletal: No edema or tenderness.  Lymphadenopathy: No cervical, axillary or supraclavicular adenopathy.  Neurological: Alert and oriented to person, place, and time. No cranial nerve deficit.  Skin: Skin is warm and dry. No rash noted. No erythema. No pallor.  Psychiatric: Affect and judgment normal.   Labs No visits with results within 3 Day(s) from this visit.  Latest known visit with results is:  Office Visit on 03/15/2017  Component Date Value Ref Range Status  . MICRO NUMBER: 03/15/2017 [id number]   Final  . SPECIMEN QUALITY: 03/15/2017 ADEQUATE   Final  . SOURCE: 03/15/2017 NOT GIVEN   Final  . STATUS: 03/15/2017  FINAL   Final  . RESULT: 03/15/2017 No group A Streptococcus isolated   Final     Pathology No orders of the defined types were placed in this encounter.      Zoila Shutter MD

## 2017-07-16 ENCOUNTER — Ambulatory Visit (INDEPENDENT_AMBULATORY_CARE_PROVIDER_SITE_OTHER): Payer: Medicare Other | Admitting: Family Medicine

## 2017-07-16 ENCOUNTER — Encounter: Payer: Self-pay | Admitting: Family Medicine

## 2017-07-16 VITALS — BP 124/87 | HR 80 | Temp 97.5°F | Resp 16 | Ht 65.5 in | Wt 133.0 lb

## 2017-07-16 DIAGNOSIS — D6800 Von Willebrand disease, unspecified: Secondary | ICD-10-CM

## 2017-07-16 DIAGNOSIS — Z170 Estrogen receptor positive status [ER+]: Secondary | ICD-10-CM

## 2017-07-16 DIAGNOSIS — E7800 Pure hypercholesterolemia, unspecified: Secondary | ICD-10-CM

## 2017-07-16 DIAGNOSIS — Z0000 Encounter for general adult medical examination without abnormal findings: Secondary | ICD-10-CM

## 2017-07-16 DIAGNOSIS — M810 Age-related osteoporosis without current pathological fracture: Secondary | ICD-10-CM | POA: Diagnosis not present

## 2017-07-16 DIAGNOSIS — Z8673 Personal history of transient ischemic attack (TIA), and cerebral infarction without residual deficits: Secondary | ICD-10-CM

## 2017-07-16 DIAGNOSIS — C50112 Malignant neoplasm of central portion of left female breast: Secondary | ICD-10-CM | POA: Diagnosis not present

## 2017-07-16 NOTE — Progress Notes (Signed)
Subjective:    Patient ID: Caroline Valdez, female    DOB: [birth year], 67 y.o.   MRN: [id number]  HPI Patient is a very pleasant 67 year old white female who has a history of breast cancer in the left breast and also a history of a TIA who presents today for CPE.  Last colonoscopy was in 2011 and is due again in 2021.  Last mammogram was in March 2018 and is due again this March.  Last bone density was in 2017 is due again.  Last bone density was concerning for osteoporosis in the spine and borderline osteoporosis in the left hip.  She is not currently on treatment due to recalcitrant acid reflux.  She cannot tolerate bisphosphonates.  She has never tried Prolia.  Patient does not require Pap smear due to her history of a hysterectomy. Past Medical History:  Diagnosis Date  . Breast cancer ([place])    left  . 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 Endometriosis   . Fibromyalgia   . GERD (gastroesophageal reflux disease)   . Hypercholesteremia   . Mini stroke ([place]) 09/2012  . Radiculopathy   . Von Willebrand disease ([place]) 09/11/2013  . Von Willebrand's disease [facility])    history of   Past Surgical History:  Procedure Laterality Date  . ABDOMINAL HYSTERECTOMY    . BACK SURGERY    . BREAST RECONSTRUCTION    . CARPAL TUNNEL RELEASE     right  . HEMORRHOID SURGERY    . LAMINECTOMY    . MASTECTOMY     left  . SHOULDER SURGERY    . TONSILLECTOMY     Current Outpatient Medications on File Prior to Visit  Medication Sig Dispense Refill  . acyclovir (ZOVIRAX) 400 MG tablet Take 1 tablet (400 mg total) by mouth 3 (three) times daily. (Patient taking differently: Take 400 mg by mouth 3 (three) times daily as needed. ) 30 tablet 0  . ALPRAZolam (XANAX) 0.5 MG tablet Take 1 tablet (0.5 mg total) by mouth at bedtime as needed. 60 tablet 1  . aspirin 81 MG tablet Take 81 mg by mouth daily.    . calcium carbonate (OS-CAL) 600 MG TABS Take 600 mg by mouth daily.     . cholecalciferol  (VITAMIN D) 1000 UNITS tablet Take 1,000 Units by mouth daily.    . Multiple Vitamins-Minerals (ICAPS MV PO) Take 1 capsule by mouth daily.     . pantoprazole (PROTONIX) 40 MG tablet TAKE 1 TABLET DAILY 90 tablet 3  . ranitidine (ZANTAC) 150 MG tablet Take 150 mg by mouth 2 (two) times daily.    . rosuvastatin (CRESTOR) 20 MG tablet Take 1 tablet (20 mg total) by mouth daily. 90 tablet 3  . zinc sulfate 220 MG capsule Take 220 mg by mouth daily.       No current facility-administered medications on file prior to visit.    Allergies  Allergen Reactions  . Aspirin Other (See Comments)    Pt has Von Willebrands Disease Pt has [place]  . Tape Dermatitis   Social History   Socioeconomic History  . Marital status: Widowed    Spouse name: Not on file  . Number of children: Not on file  . Years of education: Not on file  . Highest education level: Not on file  Social Needs  . Financial resource strain: Not on file  . Food insecurity - worry: Not on file  . Food insecurity -  inability: Not on file  . Transportation needs - medical: Not on file  . Transportation needs - non-medical: Not on file  Occupational History  . Not on file  Tobacco Use  . Smoking status: Never Smoker  . Smokeless tobacco: Never Used  Substance and Sexual Activity  . Alcohol use: No  . Drug use: No  . Sexual activity: Not on file    Comment: husband recently died from myelodysplastic syndrome in January of 2014  Other Topics Concern  . Not on file  Social History Narrative  . Not on file   Family History  Problem Relation Age of Onset  . Breast cancer Mother 29       now 23yo  . Stroke Father        Father had no full sisters  . Breast cancer Sister 32       now 79yo; had TAH/BSO  . Lung cancer Brother        died 82yo; smoker   Immunization History  Administered Date(s) Administered  . Influenza Split 01/19/2014  . Influenza Whole 03/07/2009, 03/06/2010  . Influenza,inj,Quad  PF,6+ Mos 01/26/2013, 02/17/2015, 01/28/2017  . Influenza-Unspecified 03/09/2016  . Pneumococcal Conjugate-13 10/22/2014  . Pneumococcal Polysaccharide-23 09/07/2010, 03/23/2016  . Td 12/25/2007  . Tdap 12/25/2007  . Zoster 03/30/2010   Mammogram is up-to-date. Colonoscopy is up-to-date.  Does not require a Pap smear given the fact she's had a hysterectomy.    Review of Systems  All other systems reviewed and are negative.      Objective:   Physical Exam  Constitutional: She is oriented to person, place, and time. She appears well-developed and well-nourished. No distress.  HENT:  Head: Normocephalic and atraumatic.  Right Ear: External ear normal.  Left Ear: External ear normal.  Nose: Nose normal.  Mouth/Throat: Oropharynx is clear and moist. No oropharyngeal exudate.  Eyes: Conjunctivae and EOM are normal. Pupils are equal, round, and reactive to light. Right eye exhibits no discharge. Left eye exhibits no discharge. No scleral icterus.  Neck: Normal range of motion. Neck supple. No JVD present. No tracheal deviation present. No thyromegaly present.  Cardiovascular: Normal rate, regular rhythm, normal heart sounds and intact distal pulses. Exam reveals no gallop and no friction rub.  No murmur heard. Pulmonary/Chest: Effort normal and breath sounds normal. No stridor. No respiratory distress. She has no wheezes. She has no rales. She exhibits no tenderness.  Abdominal: Soft. Bowel sounds are normal. She exhibits no distension. There is no tenderness. There is no rebound and no guarding.  Musculoskeletal: Normal range of motion. She exhibits no edema or tenderness.  Lymphadenopathy:    She has no cervical adenopathy.  Neurological: She is alert and oriented to person, place, and time. She displays normal reflexes. No cranial nerve deficit. She exhibits normal muscle tone. Coordination normal.  Skin: Skin is warm. No rash noted. She is not diaphoretic. No erythema. No pallor.    Psychiatric: She has a normal mood and affect. Her behavior is normal. Judgment and thought content normal.  Vitals reviewed.         Assessment & Plan:  Pure hypercholesterolemia - Plan: CBC with Differential/Platelet, COMPLETE METABOLIC PANEL WITH GFR, Lipid panel  Osteoporosis, unspecified osteoporosis type, unspecified pathological fracture presence  Malignant neoplasm of central portion of left breast in female, estrogen receptor positive ([place])  Von Willebrand disease ([place])  History of TIA (transient ischemic attack)  General medical exam  Physical exam today is completely normal.  Blood pressure is excellent.  I will schedule the patient for Prolia injections for osteoporosis as well as a bone density test.  Patient will schedule her mammogram.  I recommended the shingles vaccine, Shingrix.  Patient will check on the price first.  I will also check a CBC, CMP, fasting lipid panel.  Goal LDL cholesterol is less than 70.  The remainder of her immunizations are up-to-date as well as her cancer screening.

## 2017-07-17 ENCOUNTER — Other Ambulatory Visit: Payer: Self-pay | Admitting: Family Medicine

## 2017-07-17 DIAGNOSIS — Z1231 Encounter for screening mammogram for malignant neoplasm of breast: Secondary | ICD-10-CM

## 2017-07-17 LAB — COMPLETE METABOLIC PANEL WITH GFR
AG RATIO: 2 (calc) (ref 1.0–2.5)
ALKALINE PHOSPHATASE (APISO): 50 U/L (ref 33–130)
ALT: 14 U/L (ref 6–29)
AST: 24 U/L (ref 10–35)
Albumin: 4.5 g/dL (ref 3.6–5.1)
BILIRUBIN TOTAL: 0.6 mg/dL (ref 0.2–1.2)
BUN: 14 mg/dL (ref 7–25)
CHLORIDE: 105 mmol/L (ref 98–110)
CO2: 27 mmol/L (ref 20–32)
Calcium: 9.1 mg/dL (ref 8.6–10.4)
Creat: 0.73 mg/dL (ref 0.50–0.99)
GFR, Est African American: 98 mL/min/{1.73_m2} (ref >=60)
GFR, Est Non African American: 85 mL/min/{1.73_m2} (ref >=60)
GLOBULIN: 2.3 g/dL (ref 1.9–3.7)
Glucose, Bld: 93 mg/dL (ref 65–99)
POTASSIUM: 4.2 mmol/L (ref 3.5–5.3)
SODIUM: 141 mmol/L (ref 135–146)
Total Protein: 6.8 g/dL (ref 6.1–8.1)

## 2017-07-17 LAB — SPECIMEN COMPROMISED

## 2017-07-17 LAB — CBC WITH DIFFERENTIAL/PLATELET
BASOS PCT: 0.8 %
Basophils Absolute: 40 cells/uL (ref 0–200)
EOS PCT: 2.2 %
Eosinophils Absolute: 110 cells/uL (ref 15–500)
HCT: 42 % (ref 35.0–45.0)
HEMOGLOBIN: 14.2 g/dL (ref 11.7–15.5)
Lymphs Abs: 1530 cells/uL (ref 850–3900)
MCH: 31.5 pg (ref 27.0–33.0)
MCHC: 33.8 g/dL (ref 32.0–36.0)
MCV: 93.1 fL (ref 80.0–100.0)
MONOS PCT: 10.7 %
MPV: 10.7 fL (ref 7.5–12.5)
NEUTROS ABS: 2785 {cells}/uL (ref 1500–7800)
Neutrophils Relative %: 55.7 %
Platelets: 194 10*3/uL (ref 140–400)
RBC: 4.51 10*6/uL (ref 3.80–5.10)
RDW: 12.1 % (ref 11.0–15.0)
Total Lymphocyte: 30.6 %
WBC mixed population: 535 cells/uL (ref 200–950)
WBC: 5 10*3/uL (ref 3.8–10.8)

## 2017-07-17 LAB — LIPID PANEL
CHOLESTEROL: 168 mg/dL (ref <200)
HDL: 67 mg/dL (ref >50)
LDL Cholesterol (Calc): 83 mg/dL (calc)
Non-HDL Cholesterol (Calc): 101 mg/dL (calc) (ref <130)
Total CHOL/HDL Ratio: 2.5 (calc) (ref <5.0)
Triglycerides: 85 mg/dL (ref <150)

## 2017-07-18 ENCOUNTER — Encounter: Payer: Self-pay | Admitting: Family Medicine

## 2017-07-22 MED ORDER — ZOSTER VAC RECOMB ADJUVANTED 50 MCG/0.5ML IM SUSR: 0.5000 mL | Freq: Once | INTRAMUSCULAR | 1 refills | Status: AC

## 2017-07-25 ENCOUNTER — Ambulatory Visit (HOSPITAL_COMMUNITY)
Admission: RE | Admit: 2017-07-25 | Discharge: 2017-07-25 | Disposition: A | Discharge status: Home / Self Care | Payer: Medicare Other | Source: Ambulatory Visit | Attending: Family Medicine | Admitting: Family Medicine

## 2017-07-25 DIAGNOSIS — Z1231 Encounter for screening mammogram for malignant neoplasm of breast: Secondary | ICD-10-CM | POA: Diagnosis not present

## 2017-07-25 DIAGNOSIS — Z13820 Encounter for screening for osteoporosis: Secondary | ICD-10-CM | POA: Insufficient documentation

## 2017-07-25 DIAGNOSIS — Z780 Asymptomatic menopausal state: Secondary | ICD-10-CM | POA: Diagnosis not present

## 2017-07-25 DIAGNOSIS — M810 Age-related osteoporosis without current pathological fracture: Secondary | ICD-10-CM | POA: Insufficient documentation

## 2017-07-26 ENCOUNTER — Encounter: Payer: Self-pay | Admitting: Family Medicine

## 2017-07-30 ENCOUNTER — Ambulatory Visit (INDEPENDENT_AMBULATORY_CARE_PROVIDER_SITE_OTHER): Payer: Medicare Other | Admitting: Family Medicine

## 2017-07-30 ENCOUNTER — Encounter: Payer: Self-pay | Admitting: Family Medicine

## 2017-07-30 VITALS — BP 140/80 | HR 68 | Temp 98.1°F | Resp 14 | Ht 65.5 in | Wt 132.0 lb

## 2017-07-30 DIAGNOSIS — M810 Age-related osteoporosis without current pathological fracture: Secondary | ICD-10-CM

## 2017-07-30 DIAGNOSIS — S39012A Strain of muscle, fascia and tendon of lower back, initial encounter: Secondary | ICD-10-CM | POA: Diagnosis not present

## 2017-07-30 MED ORDER — DENOSUMAB 60 MG/ML ~~LOC~~ SOLN
60.0000 mg | SUBCUTANEOUS | Status: DC
  Administered 2017-07-30 – 2018-08-11 (×2): 60 mg via SUBCUTANEOUS

## 2017-07-30 MED ORDER — DICLOFENAC SODIUM 75 MG PO TBEC: 75.0000 mg | DELAYED_RELEASE_TABLET | Freq: Two times a day (BID) | ORAL | 0 refills | Status: DC

## 2017-07-30 MED ORDER — RANITIDINE HCL 150 MG PO TABS: 150.0000 mg | ORAL_TABLET | Freq: Two times a day (BID) | ORAL | 2 refills | Status: DC

## 2017-07-30 MED ORDER — CYCLOBENZAPRINE HCL 10 MG PO TABS: 10.0000 mg | ORAL_TABLET | Freq: Three times a day (TID) | ORAL | 0 refills | Status: DC | PRN

## 2017-07-30 NOTE — Progress Notes (Signed)
Subjective:    Patient ID: Caroline Valdez, female    DOB: August 09, 1950, 67 y.o.   MRN: [id number]  HPI  Patient presents today with approximately 1 week of low back pain.  Back pain is in a bandlike pattern approximately the level of L4-L5 radiating from side to side in her lower back.  It is a dull ache.  There is no radiation down her leg.  She denies any sciatica.  She denies any leg weakness.  She denies any saddle anesthesia.  She denies any bowel or bladder incontinence.  She denies any fevers chills.  She denies any dysuria or hematuria.  She denies any injury or fall that caused the pain.  Occurred gradually and slowly worsened.  It is made worse by movement. Past Medical History:  Diagnosis Date  . Breast cancer ([place])    left  . 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 Endometriosis   . Fibromyalgia   . GERD (gastroesophageal reflux disease)   . Hypercholesteremia   . Mini stroke ([place]) 09/2012  . Radiculopathy   . Von Willebrand disease ([place]) 09/11/2013  . Von Willebrand's disease [facility])    history of   Past Surgical History:  Procedure Laterality Date  . ABDOMINAL HYSTERECTOMY    . BACK SURGERY    . BREAST RECONSTRUCTION    . CARPAL TUNNEL RELEASE     right  . HEMORRHOID SURGERY    . LAMINECTOMY    . MASTECTOMY     left  . SHOULDER SURGERY    . TONSILLECTOMY     Current Outpatient Medications on File Prior to Visit  Medication Sig Dispense Refill  . acyclovir (ZOVIRAX) 400 MG tablet Take 1 tablet (400 mg total) by mouth 3 (three) times daily. (Patient taking differently: Take 400 mg by mouth 3 (three) times daily as needed. ) 30 tablet 0  . ALPRAZolam (XANAX) 0.5 MG tablet Take 1 tablet (0.5 mg total) by mouth at bedtime as needed. 60 tablet 1  . aspirin 81 MG tablet Take 81 mg by mouth daily.    . calcium carbonate (OS-CAL) 600 MG TABS Take 600 mg by mouth daily.     . cholecalciferol (VITAMIN D) 1000 UNITS tablet Take 1,000 Units by mouth daily.    .  Multiple Vitamins-Minerals (ICAPS MV PO) Take 1 capsule by mouth daily.     . pantoprazole (PROTONIX) 40 MG tablet TAKE 1 TABLET DAILY 90 tablet 3  . rosuvastatin (CRESTOR) 20 MG tablet Take 1 tablet (20 mg total) by mouth daily. 90 tablet 3  . zinc sulfate 220 MG capsule Take 220 mg by mouth daily.       No current facility-administered medications on file prior to visit.    Allergies  Allergen Reactions  . Aspirin Other (See Comments)    Pt has Von Willebrands Disease Pt has [place]  . Tape Dermatitis   Social History   Socioeconomic History  . Marital status: Widowed    Spouse name: Not on file  . Number of children: Not on file  . Years of education: Not on file  . Highest education level: Not on file  Social Needs  . Financial resource strain: Not on file  . Food insecurity - worry: Not on file  . Food insecurity - inability: Not on file  . Transportation needs - medical: Not on file  . Transportation needs - non-medical: Not on file  Occupational History  . Not  on file  Tobacco Use  . Smoking status: Never Smoker  . Smokeless tobacco: Never Used  Substance and Sexual Activity  . Alcohol use: No  . Drug use: No  . Sexual activity: Not on file    Comment: husband recently died from myelodysplastic syndrome in January of 2014  Other Topics Concern  . Not on file  Social History Narrative  . Not on file     Review of Systems  All other systems reviewed and are negative.      Objective:   Physical Exam  Cardiovascular: Normal rate, regular rhythm and normal heart sounds.  Pulmonary/Chest: Effort normal and breath sounds normal. No respiratory distress. She has no wheezes. She has no rales.  Musculoskeletal:       Lumbar back: She exhibits decreased range of motion, tenderness and pain. She exhibits no bony tenderness, no deformity and no spasm.       Back:  Neurological: She has normal reflexes. She displays normal reflexes. She exhibits  normal muscle tone. Coordination normal.  Vitals reviewed.         Assessment & Plan:  Strain of lumbar region, initial encounter - Plan: denosumab (PROLIA) injection 60 mg  I believe the patient likely strained a muscle in her lower back.  I recommended tincture of time.  Use Flexeril 10 mg every 8 hours as needed for muscle spasms.  Use diclofenac 75 mg p.o. twice daily.  If no better in 2 weeks, or if worsening, proceed with x-ray of the lower back given her history of osteoporosis.  Patient recently was found to have a ruptured implant on a mammogram.  She is questioning me regarding what should be done regarding this.  I looked on up-to-date and the most recent recommendations I can find are listed below  "Ruptured silicone gel implants should be removed due to the possibility of the gel material causing inflammation and other tissue reactions, particularly if the material spreads beyond the breast capsule (extracapsular rupture). If rupture is suspected or known, a capsulectomy is typically performed to remove the gel material from the breast pocket. Close observation is an option for those patients who do not wish to have a ruptured implant removed or exchanged due to cost or current overall satisfaction with their breasts. There is no clear evidence that any silicone granulomas that develop contribute to any systemic diseases."  I will relay this information to the patient and allow her to make her decision.

## 2017-08-06 ENCOUNTER — Other Ambulatory Visit: Payer: Self-pay | Admitting: Family Medicine

## 2017-08-06 MED ORDER — ACYCLOVIR 400 MG PO TABS: 400.0000 mg | ORAL_TABLET | Freq: Three times a day (TID) | ORAL | 0 refills | Status: DC

## 2017-08-21 DIAGNOSIS — T8549XA Other mechanical complication of breast prosthesis and implant, initial encounter: Secondary | ICD-10-CM | POA: Diagnosis not present

## 2017-08-21 DIAGNOSIS — Z853 Personal history of malignant neoplasm of breast: Secondary | ICD-10-CM | POA: Diagnosis not present

## 2017-08-26 ENCOUNTER — Other Ambulatory Visit (HOSPITAL_COMMUNITY): Payer: Self-pay | Admitting: Plastic Surgery

## 2017-08-26 DIAGNOSIS — D485 Neoplasm of uncertain behavior of skin: Secondary | ICD-10-CM | POA: Diagnosis not present

## 2017-08-26 DIAGNOSIS — Z85828 Personal history of other malignant neoplasm of skin: Secondary | ICD-10-CM | POA: Diagnosis not present

## 2017-08-26 DIAGNOSIS — Z853 Personal history of malignant neoplasm of breast: Secondary | ICD-10-CM

## 2017-08-26 DIAGNOSIS — T8549XA Other mechanical complication of breast prosthesis and implant, initial encounter: Secondary | ICD-10-CM

## 2017-08-26 DIAGNOSIS — L570 Actinic keratosis: Secondary | ICD-10-CM | POA: Diagnosis not present

## 2017-08-27 ENCOUNTER — Encounter: Payer: Self-pay | Admitting: Family Medicine

## 2017-08-27 ENCOUNTER — Ambulatory Visit (INDEPENDENT_AMBULATORY_CARE_PROVIDER_SITE_OTHER): Payer: Medicare Other | Admitting: Family Medicine

## 2017-08-27 VITALS — BP 120/74 | HR 86 | Temp 98.1°F | Resp 18 | Ht 65.5 in | Wt 132.0 lb

## 2017-08-27 DIAGNOSIS — J302 Other seasonal allergic rhinitis: Secondary | ICD-10-CM

## 2017-08-27 MED ORDER — PREDNISONE 20 MG PO TABS: ORAL_TABLET | ORAL | 0 refills | Status: DC

## 2017-08-27 NOTE — Progress Notes (Signed)
Subjective:    Patient ID: Caroline Valdez, female    DOB: Oct 21, 1950, 67 y.o.   MRN: [id number]  HPI  Patient is suffering with severe seasonal allergies.  She reports itchy watery eyes with pressure behind the eyes.  She reports rhinorrhea.  She reports clear rhinorrhea.  Her nose will run even without her being aware of it.  Her nose will simply start dripping.  She is trying Flonase and Claritin without any relief.  She has been on these medications for more than a week.  She denies any fevers or chills or sinus pain.  She does report head pressure.  She denies any sore throat.  She denies any coughing or wheezing. Past Medical History:  Diagnosis Date  . Breast cancer ([place])    left  . 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 Endometriosis   . Fibromyalgia   . GERD (gastroesophageal reflux disease)   . Hypercholesteremia   . Mini stroke ([place]) 09/2012  . Radiculopathy   . Von Willebrand disease ([place]) 09/11/2013  . Von Willebrand's disease [facility])    history of   Past Surgical History:  Procedure Laterality Date  . ABDOMINAL HYSTERECTOMY    . BACK SURGERY    . BREAST RECONSTRUCTION    . CARPAL TUNNEL RELEASE     right  . HEMORRHOID SURGERY    . LAMINECTOMY    . MASTECTOMY     left  . SHOULDER SURGERY    . TONSILLECTOMY     Current Outpatient Medications on File Prior to Visit  Medication Sig Dispense Refill  . acyclovir (ZOVIRAX) 400 MG tablet Take 1 tablet (400 mg total) by mouth 3 (three) times daily. 30 tablet 0  . ALPRAZolam (XANAX) 0.5 MG tablet Take 1 tablet (0.5 mg total) by mouth at bedtime as needed. 60 tablet 1  . aspirin 81 MG tablet Take 81 mg by mouth daily.    . calcium carbonate (OS-CAL) 600 MG TABS Take 600 mg by mouth daily.     . cholecalciferol (VITAMIN D) 1000 UNITS tablet Take 1,000 Units by mouth daily.    . Multiple Vitamins-Minerals (ICAPS MV PO) Take 1 capsule by mouth daily.     . pantoprazole (PROTONIX) 40 MG tablet TAKE 1 TABLET DAILY  90 tablet 3  . ranitidine (ZANTAC) 150 MG tablet Take 1 tablet (150 mg total) by mouth 2 (two) times daily. 180 tablet 2  . rosuvastatin (CRESTOR) 20 MG tablet Take 1 tablet (20 mg total) by mouth daily. 90 tablet 3  . zinc sulfate 220 MG capsule Take 220 mg by mouth daily.       Current Facility-Administered Medications on File Prior to Visit  Medication Dose Route Frequency Provider Last Rate Last Dose  . denosumab (PROLIA) injection 60 mg  60 mg Subcutaneous Q6 months Susy Frizzle, MD   60 mg at 07/30/17 1033   Allergies  Allergen Reactions  . Aspirin Other (See Comments)    Pt has Von Willebrands Disease Pt has [place]  . Tape Dermatitis   Social History   Socioeconomic History  . Marital status: Widowed    Spouse name: Not on file  . Number of children: Not on file  . Years of education: Not on file  . Highest education level: Not on file  Occupational History  . Not on file  Social Needs  . Financial resource strain: Not on file  . Food insecurity:  Worry: Not on file    Inability: Not on file  . Transportation needs:    Medical: Not on file    Non-medical: Not on file  Tobacco Use  . Smoking status: Never Smoker  . Smokeless tobacco: Never Used  Substance and Sexual Activity  . Alcohol use: No  . Drug use: No  . Sexual activity: Not on file    Comment: husband recently died from myelodysplastic syndrome in January of 2014  Lifestyle  . Physical activity:    Days per week: Not on file    Minutes per session: Not on file  . Stress: Not on file  Relationships  . Social connections:    Talks on phone: Not on file    Gets together: Not on file    Attends religious service: Not on file    Active member of club or organization: Not on file    Attends meetings of clubs or organizations: Not on file    Relationship status: Not on file  . Intimate partner violence:    Fear of current or ex partner: Not on file    Emotionally abused: Not on  file    Physically abused: Not on file    Forced sexual activity: Not on file  Other Topics Concern  . Not on file  Social History Narrative  . Not on file     Review of Systems  All other systems reviewed and are negative.      Objective:   Physical Exam  HENT:  Right Ear: Tympanic membrane, external ear and ear canal normal.  Left Ear: Tympanic membrane, external ear and ear canal normal.  Nose: Mucosal edema and rhinorrhea present. Right sinus exhibits no maxillary sinus tenderness and no frontal sinus tenderness. Left sinus exhibits no maxillary sinus tenderness and no frontal sinus tenderness.  Mouth/Throat: Oropharynx is clear and moist. No oropharyngeal exudate, posterior oropharyngeal edema or posterior oropharyngeal erythema.  Neck: Neck supple. No thyromegaly present.  Cardiovascular: Normal rate, regular rhythm and normal heart sounds.  Pulmonary/Chest: Effort normal and breath sounds normal. No stridor. No respiratory distress. She has no wheezes. She has no rales.  Lymphadenopathy:    She has no cervical adenopathy.  Vitals reviewed.         Assessment & Plan:  Seasonal allergies  Patient appears to be suffering from severe allergies.  This is despite the fact she is using an antihistamine, nasal steroid spray, and has even started using Afrin.  She is failing conservative therapy now for more than a week.  I have recommended starting her on prednisone taper pack but continuing the Flonase and the antihistamine moving forward as a preventative strategy to prevent a rebound flare after she discontinues the prednisone.  She will call me if she develops symptoms of a sinus infection

## 2017-08-30 ENCOUNTER — Telehealth (HOSPITAL_COMMUNITY): Payer: Self-pay | Admitting: Plastic Surgery

## 2017-09-02 ENCOUNTER — Ambulatory Visit (HOSPITAL_COMMUNITY): Admission: RE | Admit: 2017-09-02 | Payer: Medicare Other | Source: Ambulatory Visit

## 2017-09-04 ENCOUNTER — Ambulatory Visit (HOSPITAL_COMMUNITY)
Admission: RE | Admit: 2017-09-04 | Discharge: 2017-09-04 | Disposition: A | Discharge status: Home / Self Care | Payer: Medicare Other | Source: Ambulatory Visit | Attending: Plastic Surgery | Admitting: Plastic Surgery

## 2017-09-04 DIAGNOSIS — Y831 Surgical operation with implant of artificial internal device as the cause of abnormal reaction of the patient, or of later complication, without mention of misadventure at the time of the procedure: Secondary | ICD-10-CM | POA: Diagnosis not present

## 2017-09-04 DIAGNOSIS — Z853 Personal history of malignant neoplasm of breast: Secondary | ICD-10-CM | POA: Insufficient documentation

## 2017-09-04 DIAGNOSIS — T8549XA Other mechanical complication of breast prosthesis and implant, initial encounter: Secondary | ICD-10-CM | POA: Diagnosis not present

## 2017-09-04 MED ORDER — GADOBENATE DIMEGLUMINE 529 MG/ML IV SOLN
15.0000 mL | Freq: Once | INTRAVENOUS | Status: AC | PRN
  Administered 2017-09-04: 12 mL via INTRAVENOUS

## 2017-09-05 LAB — POCT I-STAT CREATININE: Creatinine, Ser: 1 mg/dL (ref 0.44–1.00)

## 2017-09-17 ENCOUNTER — Other Ambulatory Visit: Payer: Self-pay | Admitting: Family Medicine

## 2017-09-18 DIAGNOSIS — Z853 Personal history of malignant neoplasm of breast: Secondary | ICD-10-CM | POA: Diagnosis not present

## 2017-09-18 DIAGNOSIS — T8549XA Other mechanical complication of breast prosthesis and implant, initial encounter: Secondary | ICD-10-CM | POA: Diagnosis not present

## 2017-09-19 ENCOUNTER — Encounter: Payer: Self-pay | Admitting: Hematology and Oncology

## 2017-09-19 ENCOUNTER — Telehealth: Payer: Self-pay | Admitting: Hematology and Oncology

## 2017-09-19 NOTE — Telephone Encounter (Signed)
Received a call from [place] from Dr. Harlow Mares office to schedule the pt to see a hematologist in [place]. Pt has formerly been seen at [place] but is wanting to be seen in [place]. Pt has been scheduled to see Dr. Lindi Adie on 5/21 at 1pm. Maudie Mercury will notify the pt. Letter mailed.

## 2017-10-02 DIAGNOSIS — H353131 Nonexudative age-related macular degeneration, bilateral, early dry stage: Secondary | ICD-10-CM | POA: Diagnosis not present

## 2017-10-02 DIAGNOSIS — H2513 Age-related nuclear cataract, bilateral: Secondary | ICD-10-CM | POA: Diagnosis not present

## 2017-10-08 ENCOUNTER — Inpatient Hospital Stay: Payer: Medicare Other | Attending: Hematology and Oncology | Admitting: Hematology and Oncology

## 2017-10-08 ENCOUNTER — Telehealth: Payer: Self-pay | Admitting: Hematology and Oncology

## 2017-10-08 ENCOUNTER — Inpatient Hospital Stay: Payer: Medicare Other

## 2017-10-08 ENCOUNTER — Encounter: Payer: Self-pay | Admitting: Family Medicine

## 2017-10-08 VITALS — BP 126/77 | HR 87 | Temp 98.3°F | Resp 19 | Ht 65.5 in | Wt 132.0 lb

## 2017-10-08 DIAGNOSIS — Z79899 Other long term (current) drug therapy: Secondary | ICD-10-CM | POA: Diagnosis not present

## 2017-10-08 DIAGNOSIS — Z8673 Personal history of transient ischemic attack (TIA), and cerebral infarction without residual deficits: Secondary | ICD-10-CM | POA: Diagnosis not present

## 2017-10-08 DIAGNOSIS — R58 Hemorrhage, not elsewhere classified: Secondary | ICD-10-CM | POA: Diagnosis not present

## 2017-10-08 DIAGNOSIS — D6800 Von Willebrand disease, unspecified: Secondary | ICD-10-CM

## 2017-10-08 DIAGNOSIS — E7800 Pure hypercholesterolemia, unspecified: Secondary | ICD-10-CM | POA: Insufficient documentation

## 2017-10-08 DIAGNOSIS — Z86000 Personal history of in-situ neoplasm of breast: Secondary | ICD-10-CM | POA: Diagnosis not present

## 2017-10-08 DIAGNOSIS — D0512 Intraductal carcinoma in situ of left breast: Secondary | ICD-10-CM

## 2017-10-08 DIAGNOSIS — Z9012 Acquired absence of left breast and nipple: Secondary | ICD-10-CM | POA: Diagnosis not present

## 2017-10-08 DIAGNOSIS — Z801 Family history of malignant neoplasm of trachea, bronchus and lung: Secondary | ICD-10-CM | POA: Diagnosis not present

## 2017-10-08 DIAGNOSIS — Z803 Family history of malignant neoplasm of breast: Secondary | ICD-10-CM | POA: Insufficient documentation

## 2017-10-08 DIAGNOSIS — M797 Fibromyalgia: Secondary | ICD-10-CM | POA: Diagnosis not present

## 2017-10-08 DIAGNOSIS — Z0000 Encounter for general adult medical examination without abnormal findings: Secondary | ICD-10-CM

## 2017-10-08 DIAGNOSIS — K219 Gastro-esophageal reflux disease without esophagitis: Secondary | ICD-10-CM | POA: Insufficient documentation

## 2017-10-08 DIAGNOSIS — F4321 Adjustment disorder with depressed mood: Secondary | ICD-10-CM

## 2017-10-08 LAB — PROTIME-INR
INR: 0.98
Prothrombin Time: 12.9 seconds (ref 11.4–15.2)

## 2017-10-08 LAB — APTT: APTT: 31 s (ref 24–36)

## 2017-10-08 LAB — FIBRINOGEN: Fibrinogen: 385 mg/dL (ref 210–475)

## 2017-10-08 MED ORDER — ALPRAZOLAM 0.5 MG PO TABS: 0.5000 mg | ORAL_TABLET | Freq: Every evening | ORAL | 1 refills | Status: DC | PRN

## 2017-10-08 NOTE — Assessment & Plan Note (Signed)
Left breast DCIS with microinvasion status post mastectomy ER positive PR -December 2010 Did not pursue endocrine therapy

## 2017-10-08 NOTE — Progress Notes (Signed)
[facility] CONSULT NOTE  Patient Care Team: Susy Frizzle, MD as PCP - General (Family Medicine)  CHIEF COMPLAINTS/PURPOSE OF CONSULTATION:  Von Willebrand factor deficiency  HISTORY OF PRESENTING ILLNESS:  Caroline Valdez 67 y.o. female is here because of the prior diagnosis of von Willebrand factor deficiency 20 years ago [place].  She had 3 episodes of heavy menstrual bleeding.  First episode was when she was a teenager and had tonsils taken out.  Second episode was during her childbirth and third episode was after hysterectomy.  There is no family history of any von Willebrand factor deficiency.  Her daughter also does not have any symptoms of this.  She reports that sometimes during surgery she does not have bleeding but these few episodes she had profound bleeding.  She was seen by Dr. Harlow Mares to remove the ruptured implant.  But before the surgery Dr. Harlow Mares wanted her to see [place] so that we can plan any preventive measures to stop any bleeding. Patient has a prior history of DCIS left breast underwent mastectomy.  She refused antiestrogen therapy at this time. I reviewed her records extensively and collaborated the history with the patient.  SUMMARY OF ONCOLOGIC HISTORY:   Ductal carcinoma in situ (DCIS) of left breast   04/2009 Initial Diagnosis    Left breast DCIS with microinvasion status post left mastectomy and reconstruction together with right breast reduction and implant, ER positive PR negative, 2 cm size, Tis NX stage 0; patient did not want antiestrogen therapy       MEDICAL HISTORY:  Past Medical History:  Diagnosis Date  . Breast cancer ([place])    left  . 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 Endometriosis   . Fibromyalgia   . GERD (gastroesophageal reflux disease)   . Hypercholesteremia   . Mini stroke ([place]) 09/2012  . Radiculopathy   . Von Willebrand disease ([place]) 09/11/2013  . Von Willebrand's disease [facility])    history of     SURGICAL HISTORY: Past Surgical History:  Procedure Laterality Date  . ABDOMINAL HYSTERECTOMY    . BACK SURGERY    . BREAST RECONSTRUCTION    . CARPAL TUNNEL RELEASE     right  . HEMORRHOID SURGERY    . LAMINECTOMY    . MASTECTOMY     left  . SHOULDER SURGERY    . TONSILLECTOMY      SOCIAL HISTORY: Social History   Socioeconomic History  . Marital status: Widowed    Spouse name: Not on file  . Number of children: Not on file  . Years of education: Not on file  . Highest education level: Not on file  Occupational History  . Not on file  Social Needs  . Financial resource strain: Not on file  . Food insecurity:    Worry: Not on file    Inability: Not on file  . Transportation needs:    Medical: Not on file    Non-medical: Not on file  Tobacco Use  . Smoking status: Never Smoker  . Smokeless tobacco: Never Used  Substance and Sexual Activity  . Alcohol use: No  . Drug use: No  . Sexual activity: Not on file    Comment: husband recently died from myelodysplastic syndrome in January of 2014  Lifestyle  . Physical activity:    Days per week: Not on file    Minutes per session: Not on file  . Stress: Not on file  Relationships  . Social connections:    Talks on phone: Not on file    Gets together: Not on file    Attends religious service: Not on file    Active member of club or organization: Not on file    Attends meetings of clubs or organizations: Not on file    Relationship status: Not on file  . Intimate partner violence:    Fear of current or ex partner: Not on file    Emotionally abused: Not on file    Physically abused: Not on file    Forced sexual activity: Not on file  Other Topics Concern  . Not on file  Social History Narrative  . Not on file    FAMILY HISTORY: Family History  Problem Relation Age of Onset  . Breast cancer Mother 58       now 66yo  . Stroke Father        Father had no full sisters  . Breast cancer Sister 60        now 67yo; had TAH/BSO  . Lung cancer Brother        died 30yo; smoker    ALLERGIES:  is allergic to aspirin and tape.  MEDICATIONS:  Current Outpatient Medications  Medication Sig Dispense Refill  . acyclovir (ZOVIRAX) 400 MG tablet Take 1 tablet (400 mg total) by mouth 3 (three) times daily. 30 tablet 0  . ALPRAZolam (XANAX) 0.5 MG tablet Take 1 tablet (0.5 mg total) by mouth at bedtime as needed. 60 tablet 1  . aspirin 81 MG tablet Take 81 mg by mouth daily.    . calcium carbonate (OS-CAL) 600 MG TABS Take 600 mg by mouth daily.     . cholecalciferol (VITAMIN D) 1000 UNITS tablet Take 1,000 Units by mouth daily.    . Multiple Vitamins-Minerals (ICAPS MV PO) Take 1 capsule by mouth daily.     . pantoprazole (PROTONIX) 40 MG tablet TAKE 1 TABLET DAILY 90 tablet 3  . ranitidine (ZANTAC) 150 MG tablet Take 1 tablet (150 mg total) by mouth 2 (two) times daily. 180 tablet 2  . rosuvastatin (CRESTOR) 20 MG tablet TAKE 1 TABLET DAILY 90 tablet 3   Current Facility-Administered Medications  Medication Dose Route Frequency Provider Last Rate Last Dose  . denosumab (PROLIA) injection 60 mg  60 mg Subcutaneous Q6 months Susy Frizzle, MD   60 mg at 07/30/17 1033    REVIEW OF SYSTEMS:   Constitutional: Denies fevers, chills or abnormal night sweats Eyes: Denies blurriness of vision, double vision or watery eyes Ears, nose, mouth, throat, and face: Denies mucositis or sore throat Respiratory: Denies cough, dyspnea or wheezes Cardiovascular: Denies palpitation, chest discomfort or lower extremity swelling Gastrointestinal:  Denies nausea, heartburn or change in bowel habits Skin: Denies abnormal skin rashes Lymphatics: Denies new lymphadenopathy or easy bruising Neurological:Denies numbness, tingling or new weaknesses Behavioral/Psych: Mood is stable, no new changes  Breast:  Denies any palpable lumps or discharge All other systems were reviewed with the patient and are  negative.  PHYSICAL EXAMINATION: ECOG PERFORMANCE STATUS: 1 - Symptomatic but completely ambulatory  Vitals:   10/08/17 1239  BP: 126/77  Pulse: 87  Resp: 19  Temp: 98.3 F (36.8 C)  SpO2: 96%   Filed Weights   10/08/17 1239  Weight: 132 lb (59.9 kg)    GENERAL:alert, no distress and comfortable SKIN: skin color, texture, turgor are normal, no rashes or significant lesions EYES: normal, conjunctiva are pink and  non-injected, sclera clear OROPHARYNX:no exudate, no erythema and lips, buccal mucosa, and tongue normal  NECK: supple, thyroid normal size, non-tender, without nodularity LYMPH:  no palpable lymphadenopathy in the cervical, axillary or inguinal LUNGS: clear to auscultation and percussion with normal breathing effort HEART: regular rate & rhythm and no murmurs and no lower extremity edema ABDOMEN:abdomen soft, non-tender and normal bowel sounds Musculoskeletal:no cyanosis of digits and no clubbing  PSYCH: alert & oriented x 3 with fluent speech NEURO: no focal motor/sensory deficits BREAST: No palpable nodules in breast. No palpable axillary or supraclavicular lymphadenopathy (exam performed in the presence of a chaperone)   LABORATORY DATA:  I have reviewed the data as listed Lab Results  Component Value Date   WBC 5.0 07/16/2017   HGB 14.2 07/16/2017   HCT 42.0 07/16/2017   MCV 93.1 07/16/2017   PLT 194 07/16/2017   Lab Results  Component Value Date   NA 141 07/16/2017   K 4.2 07/16/2017   CL 105 07/16/2017   CO2 27 07/16/2017    RADIOGRAPHIC STUDIES: I have personally reviewed the radiological reports and agreed with the findings in the report.  ASSESSMENT AND PLAN:  Ductal carcinoma in situ (DCIS) of left breast Left breast DCIS with microinvasion status post mastectomy ER positive PR -December 2010 Did not pursue endocrine therapy  Von Willebrand disease Type 1 Von Willebrand factor deficiency: Mild deficiency von Willebrand factor diagnosed at  [facility] 20 years ago  06/30/2013: Von Willebrand factor antigen 153%, factor VIII activity 117%, ristocetin cofactor 148%, all multiples of von Willebrand antigen are present and normal amounts Patient had bleeding episodes after tonsillectomy as a child, after the second baby childbirth, after hysterectomy.  She has no family history of bleeding problems.  And our lab results seem to not show any evidence of von Willebrand disease.  Because of this there is a concern about the accuracy of the diagnosis of von Willebrand disease.  I would like to recheck the blood work for PT PTT as well as mixing study.  I would also like to send for now 21 Willebrand factor levels but also factor VIII as well as additional factors in both intrinsic and extrinsic factor system. I would like to see her back in 2 weeks after these tests to discuss the results.  The patient is scheduled for surgery for replacement of the ruptured right breast implant sometime in July.  Our goal is to figure out if she is does have von Willebrand factor deficienvy   if she does have von Willebrand factor deficiency, we will treat her with desmopressin by IV prior to her breast surgery.   All questions were answered. The patient knows to call the clinic with any problems, questions or concerns.    Harriette Ohara, MD 10/08/17

## 2017-10-08 NOTE — Telephone Encounter (Signed)
Ok to refill??  Last office visit 08/27/2017.  Last refill 03/15/2016.

## 2017-10-08 NOTE — Assessment & Plan Note (Addendum)
Type 1 Von Willebrand factor deficiency: Mild deficiency von Willebrand factor diagnosed at [facility] 20 years ago  06/30/2013: Von Willebrand factor antigen 153%, factor VIII activity 117%, ristocetin cofactor 148%, all multiples of von Willebrand antigen are present and normal amounts Patient had bleeding episodes after tonsillectomy as a child, after the second baby childbirth, after hysterectomy.  She has no family history of bleeding problems.  And our lab results seem to not show any evidence of von Willebrand disease.  Because of this there is a concern about the accuracy of the diagnosis of von Willebrand disease.  I would like to recheck the blood work for PT PTT as well as mixing study.  I would also like to send for now 21 Willebrand factor levels but also factor VIII as well as additional factors in both intrinsic and extrinsic factor system. I would like to see her back in 2 weeks after these tests to discuss the results.  The patient is scheduled for surgery for replacement of the ruptured right breast implant sometime in July.  Our goal is to figure out if she is does have von Willebrand factor deficienvy   if she does have von Willebrand factor deficiency, we will treat her with desmopressin by IV prior to her breast surgery.

## 2017-10-08 NOTE — Telephone Encounter (Signed)
Patient decline avs and calendar °

## 2017-10-10 LAB — FACTOR 9 ASSAY: COAGULATION FACTOR IX: 98 % (ref 60–177)

## 2017-10-10 LAB — PTT FACTOR INHIBITOR (MIXING STUDY): APTT: 26.6 s (ref 22.9–30.2)

## 2017-10-10 LAB — FACTOR 7 ASSAY: Factor VII Activity: 99 % (ref 51–186)

## 2017-10-10 LAB — FACTOR 10 ASSAY: Factor X Activity: 99 % (ref 76–183)

## 2017-10-10 LAB — VON WILLEBRAND PANEL
Coagulation Factor VIII: 120 % (ref 57–163)
Ristocetin Co-factor, Plasma: 118 % (ref 50–200)
Von Willebrand Antigen, Plasma: 147 % (ref 50–200)

## 2017-10-10 LAB — FACTOR 11 ASSAY: Factor XI Activity: 94 % (ref 60–150)

## 2017-10-10 LAB — FACTOR 12 ASSAY: FACTOR XII ACTIVITY: 90 % (ref 50–150)

## 2017-10-10 LAB — COAG STUDIES INTERP REPORT

## 2017-10-17 LAB — VON WILLEBRAND FACTOR MULTIMER

## 2017-10-22 ENCOUNTER — Inpatient Hospital Stay: Payer: Medicare Other | Attending: Hematology and Oncology | Admitting: Hematology and Oncology

## 2017-10-22 DIAGNOSIS — D6800 Von Willebrand disease, unspecified: Secondary | ICD-10-CM

## 2017-10-22 DIAGNOSIS — Z86000 Personal history of in-situ neoplasm of breast: Secondary | ICD-10-CM | POA: Diagnosis not present

## 2017-10-22 DIAGNOSIS — Z79899 Other long term (current) drug therapy: Secondary | ICD-10-CM | POA: Diagnosis not present

## 2017-10-22 DIAGNOSIS — Z7982 Long term (current) use of aspirin: Secondary | ICD-10-CM | POA: Insufficient documentation

## 2017-10-22 NOTE — Assessment & Plan Note (Signed)
Review of blood work 10/08/2017: No evidence of von Willebrand factor deficiency The VWF:Ag is normal. The VWF:RCo is normal. The FVIII is  normal.  PT and INR and PTT are normal Factor VIII: 120% Von Willebrand factor antigen: 147% Ristocetin cofactor 118% Fibrinogen 385 Factor VII activity 99% Factor IX: 98% Factor X activity 99% Factor XI activity 94%  Factor XII activity 90%  I discussed with the patient that there is no evidence of coagulation factor deficiency.  There is no evidence of von Willebrand factor deficiency either.

## 2017-10-22 NOTE — Progress Notes (Signed)
Patient Care Team: Susy Frizzle, MD as PCP - General (Family Medicine)  DIAGNOSIS:  Encounter Diagnosis  Name Primary?  [name] Willebrand disease ([place])     SUMMARY OF ONCOLOGIC HISTORY:   Ductal carcinoma in situ (DCIS) of left breast   04/2009 Initial Diagnosis    Left breast DCIS with microinvasion status post left mastectomy and reconstruction together with right breast reduction and implant, ER positive PR negative, 2 cm size, Tis NX stage 0; patient did not want antiestrogen therapy       CHIEF COMPLIANT: Follow-up to discuss results of blood work  INTERVAL HISTORY: Caroline Valdez is a 67 year old with above-mentioned history of left breast DCIS who came in because of concern for von Willebrand factor deficiency.  He had performed extensive blood work and is here today to discuss those results.  REVIEW OF SYSTEMS:   Constitutional: Denies fevers, chills or abnormal weight loss Eyes: Denies blurriness of vision Ears, nose, mouth, throat, and face: Denies mucositis or sore throat Respiratory: Denies cough, dyspnea or wheezes Cardiovascular: Denies palpitation, chest discomfort Gastrointestinal:  Denies nausea, heartburn or change in bowel habits Skin: Denies abnormal skin rashes Lymphatics: Denies new lymphadenopathy or easy bruising Neurological:Denies numbness, tingling or new weaknesses Behavioral/Psych: Mood is stable, no new changes  Extremities: No lower extremity edema Breast:  denies any pain or lumps or nodules in either breasts All other systems were reviewed with the patient and are negative.  I have reviewed the past medical history, past surgical history, social history and family history with the patient and they are unchanged from previous note.  ALLERGIES:  is allergic to aspirin and tape.  MEDICATIONS:  Current Outpatient Medications  Medication Sig Dispense Refill  . acyclovir (ZOVIRAX) 400 MG tablet Take 1 tablet (400 mg total) by mouth 3  (three) times daily. 30 tablet 0  . ALPRAZolam (XANAX) 0.5 MG tablet Take 1 tablet (0.5 mg total) by mouth at bedtime as needed. 60 tablet 1  . aspirin 81 MG tablet Take 81 mg by mouth daily.    . calcium carbonate (OS-CAL) 600 MG TABS Take 600 mg by mouth daily.     . cholecalciferol (VITAMIN D) 1000 UNITS tablet Take 1,000 Units by mouth daily.    . Multiple Vitamins-Minerals (ICAPS MV PO) Take 1 capsule by mouth daily.     . pantoprazole (PROTONIX) 40 MG tablet TAKE 1 TABLET DAILY 90 tablet 3  . ranitidine (ZANTAC) 150 MG tablet Take 1 tablet (150 mg total) by mouth 2 (two) times daily. 180 tablet 2  . rosuvastatin (CRESTOR) 20 MG tablet TAKE 1 TABLET DAILY 90 tablet 3   Current Facility-Administered Medications  Medication Dose Route Frequency Provider Last Rate Last Dose  . denosumab (PROLIA) injection 60 mg  60 mg Subcutaneous Q6 months Susy Frizzle, MD   60 mg at 07/30/17 1033    PHYSICAL EXAMINATION: ECOG PERFORMANCE STATUS: 0 - Asymptomatic  Vitals:   10/22/17 1330  BP: 126/69  Pulse: 87  Resp: 20  Temp: (!) 97.5 F (36.4 C)  SpO2: 99%   Filed Weights   10/22/17 1330  Weight: 131 lb 4.8 oz (59.6 kg)    GENERAL:alert, no distress and comfortable SKIN: skin color, texture, turgor are normal, no rashes or significant lesions EYES: normal, Conjunctiva are pink and non-injected, sclera clear OROPHARYNX:no exudate, no erythema and lips, buccal mucosa, and tongue normal  NECK: supple, thyroid normal size, non-tender, without nodularity LYMPH:  no palpable lymphadenopathy  in the cervical, axillary or inguinal LUNGS: clear to auscultation and percussion with normal breathing effort HEART: regular rate & rhythm and no murmurs and no lower extremity edema ABDOMEN:abdomen soft, non-tender and normal bowel sounds MUSCULOSKELETAL:no cyanosis of digits and no clubbing  NEURO: alert & oriented x 3 with fluent speech, no focal motor/sensory deficits EXTREMITIES: No lower  extremity edema  LABORATORY DATA:  I have reviewed the data as listed CMP Latest Ref Rng & Units 09/04/2017 07/16/2017 01/15/2017  Glucose 65 - 99 mg/dL - 93 88  BUN 7 - 25 mg/dL - 14 13  Creatinine 0.44 - 1.00 mg/dL 1.00 0.73 0.89  Sodium 135 - 146 mmol/L - 141 142  Potassium 3.5 - 5.3 mmol/L - 4.2 3.8  Chloride 98 - 110 mmol/L - 105 105  CO2 20 - 32 mmol/L - 27 24  Calcium 8.6 - 10.4 mg/dL - 9.1 9.2  Total Protein 6.1 - 8.1 g/dL - 6.8 6.8  Total Bilirubin 0.2 - 1.2 mg/dL - 0.6 0.8  Alkaline Phos 33 - 130 U/L - - 54  AST 10 - 35 U/L - 24 22  ALT 6 - 29 U/L - 14 14    Lab Results  Component Value Date   WBC 5.0 07/16/2017   HGB 14.2 07/16/2017   HCT 42.0 07/16/2017   MCV 93.1 07/16/2017   PLT 194 07/16/2017   NEUTROABS 2,785 07/16/2017    ASSESSMENT & PLAN:  Von Willebrand disease Review of blood work 10/08/2017: No evidence of von Willebrand factor deficiency The VWF:Ag is normal. The VWF:RCo is normal. The FVIII is  normal.  PT and INR and PTT are normal Factor VIII: 120% Von Willebrand factor antigen: 147% Ristocetin cofactor 118% Fibrinogen 385 Factor VII activity 99% Factor IX: 98% Factor X activity 99% Factor XI activity 94%  Factor XII activity 90%  Even though the above testing did not show any clear evidence of von Willebrand disease, I recommended that the patient received DDAVP 20 mcg IV given over 30 minutes.  This should be done 30 minutes prior to her surgery.  I will discuss this with Dr. Harlow Mares to see if this could be arranged prior to her surgery on July 18 for the ruptured implant.  Patient will return back to see [place] on an as-needed basis.   No orders of the defined types were placed in this encounter.  The patient has a good understanding of the overall plan. she agrees with it. she will call with any problems that may develop before the next visit here.   Harriette Ohara, MD 10/22/17

## 2017-10-29 ENCOUNTER — Telehealth: Payer: Self-pay | Admitting: *Deleted

## 2017-10-29 NOTE — Telephone Encounter (Signed)
"  Caroline Valdez, Dr. Harlow Mares office.  Referred this patient to Dr. Lindi Adie.  Calling to request recent office note for this patient be faxed to [phone]."  Routed October 22, 2017 note at this time.

## 2017-10-31 ENCOUNTER — Telehealth: Payer: Self-pay

## 2017-10-31 NOTE — Telephone Encounter (Signed)
Faxed last office note to Dr Harlow Mares officer per the request of Maudie Mercury, at his office.

## 2017-11-05 ENCOUNTER — Other Ambulatory Visit: Payer: Self-pay | Admitting: Family Medicine

## 2017-11-20 ENCOUNTER — Ambulatory Visit (HOSPITAL_COMMUNITY): Payer: Self-pay | Admitting: Plastic Surgery

## 2017-11-20 ENCOUNTER — Other Ambulatory Visit: Payer: Self-pay

## 2017-11-20 DIAGNOSIS — D6800 Von Willebrand disease, unspecified: Secondary | ICD-10-CM

## 2017-11-20 DIAGNOSIS — Z853 Personal history of malignant neoplasm of breast: Secondary | ICD-10-CM | POA: Diagnosis not present

## 2017-11-27 NOTE — Pre-Procedure Instructions (Signed)
Caroline Valdez  11/27/2017      Caroline Valdez, Caroline Valdez [address] Phone: [phone] Fax: [phone]  [facility] [place], [place]. [place] [address] Phone: [phone] Fax: [phone]    Your procedure is scheduled on  July 18  Report to [facility] Admitting at 5:30 A.M.  Call this number if you have problems the morning of surgery:  [phone]   Remember:   Do not eat or drink after midnight.                        Take these medicines the morning of surgery with A SIP OF WATER : tylenol if needed,xanax if needed, flonase nasal spray, dilaudid if needed, claritin, robaxin if needed, pantoprazole(protonix), ranitidine (zantac)               7 days prior to surgery STOP taking any Aspirin(unless otherwise instructed by your surgeon), Aleve, Naproxen, Ibuprofen, Motrin, Advil, Goody's, BC's, all herbal medications, fish oil, and all vitamins            Follow your doctors instructions regarding your Aspirin.  If no instructions were given by your doctor, then you will need to call the prescribing office office to get instructions.       Do not wear jewelry, make-up or nail polish.  Do not wear lotions, powders, or perfumes, or deodorant.  Do not shave 48 hours prior to surgery.  Men may shave face and neck.  Do not bring valuables to the hospital.  [facility] is not responsible for any belongings or valuables.  Contacts, dentures or bridgework may not be worn into surgery.  Leave your suitcase in the car.  After surgery it may be brought to your room.  For patients admitted to the hospital, discharge time will be determined by your treatment team.  Patients discharged the day of surgery will not be allowed to drive home.    Special instructions:   [facility]- Preparing For Surgery  Before surgery, you can play an  important role. Because skin is not sterile, your skin needs to be as free of germs as possible. You can reduce the number of germs on your skin by washing with CHG (chlorahexidine gluconate) Soap before surgery.  CHG is an antiseptic cleaner which kills germs and bonds with the skin to continue killing germs even after washing.    Oral Hygiene is also important to reduce your risk of infection.  Remember - BRUSH YOUR TEETH THE MORNING OF SURGERY WITH YOUR REGULAR TOOTHPASTE  Please do not use if you have an allergy to CHG or antibacterial soaps. If your skin becomes reddened/irritated stop using the CHG.  Do not shave (including legs and underarms) for at least 48 hours prior to first CHG shower. It is OK to shave your face.  Please follow these instructions carefully.   1. Shower the NIGHT BEFORE SURGERY and the MORNING OF SURGERY with CHG.   2. If you chose to wash your hair, wash your hair first as usual with your normal shampoo.  3. After you shampoo, rinse your hair and body thoroughly to remove the shampoo.  4. Use CHG as you would any other liquid soap. You can apply CHG directly to the skin and wash gently with a  scrungie or a clean washcloth.   5. Apply the CHG Soap to your body ONLY FROM THE NECK DOWN.  Do not use on open wounds or open sores. Avoid contact with your eyes, ears, mouth and genitals (private parts). Wash Face and genitals (private parts)  with your normal soap.  6. Wash thoroughly, paying special attention to the area where your surgery will be performed.  7. Thoroughly rinse your body with warm water from the neck down.  8. DO NOT shower/wash with your normal soap after using and rinsing off the CHG Soap.  9. Pat yourself dry with a CLEAN TOWEL.  10. Wear CLEAN PAJAMAS to bed the night before surgery, wear comfortable clothes the morning of surgery  11. Place CLEAN SHEETS on your bed the night of your first shower and DO NOT SLEEP WITH PETS.    Day of  Surgery:  Do not apply any deodorants/lotions.  Please wear clean clothes to the hospital/surgery center.   Remember to brush your teeth WITH YOUR REGULAR TOOTHPASTE.    Please read over the following fact sheets that you were given. Coughing and Deep Breathing and Surgical Site Infection Prevention

## 2017-11-28 ENCOUNTER — Encounter (HOSPITAL_COMMUNITY)
Admission: RE | Admit: 2017-11-28 | Discharge: 2017-11-28 | Disposition: A | Discharge status: Home / Self Care | Payer: Medicare Other | Source: Ambulatory Visit | Attending: Plastic Surgery | Admitting: Plastic Surgery

## 2017-11-28 ENCOUNTER — Other Ambulatory Visit: Payer: Self-pay

## 2017-11-28 ENCOUNTER — Encounter (HOSPITAL_COMMUNITY): Payer: Self-pay

## 2017-11-28 DIAGNOSIS — Z853 Personal history of malignant neoplasm of breast: Secondary | ICD-10-CM | POA: Insufficient documentation

## 2017-11-28 DIAGNOSIS — Z8673 Personal history of transient ischemic attack (TIA), and cerebral infarction without residual deficits: Secondary | ICD-10-CM | POA: Insufficient documentation

## 2017-11-28 DIAGNOSIS — D680 Von Willebrand's disease: Secondary | ICD-10-CM | POA: Insufficient documentation

## 2017-11-28 DIAGNOSIS — E7800 Pure hypercholesterolemia, unspecified: Secondary | ICD-10-CM | POA: Insufficient documentation

## 2017-11-28 DIAGNOSIS — Z01812 Encounter for preprocedural laboratory examination: Secondary | ICD-10-CM | POA: Diagnosis not present

## 2017-11-28 HISTORY — DX: Cerebral infarction, unspecified: I63.9

## 2017-11-28 HISTORY — DX: Adverse effect of unspecified anesthetic, initial encounter: T41.45XA

## 2017-11-28 HISTORY — DX: Other complications of anesthesia, initial encounter: T88.59XA

## 2017-11-28 HISTORY — DX: Disease of blood and blood-forming organs, unspecified: D75.9

## 2017-11-28 LAB — CBC
HEMATOCRIT: 41.9 % (ref 36.0–46.0)
HEMOGLOBIN: 13.6 g/dL (ref 12.0–15.0)
MCH: 31.3 pg (ref 26.0–34.0)
MCHC: 32.5 g/dL (ref 30.0–36.0)
MCV: 96.3 fL (ref 78.0–100.0)
Platelets: 185 10*3/uL (ref 150–400)
RBC: 4.35 MIL/uL (ref 3.87–5.11)
RDW: 12.7 % (ref 11.5–15.5)
WBC: 7.6 10*3/uL (ref 4.0–10.5)

## 2017-11-28 LAB — BASIC METABOLIC PANEL
ANION GAP: 8 (ref 5–15)
BUN: 14 mg/dL (ref 8–23)
CALCIUM: 9.2 mg/dL (ref 8.9–10.3)
CHLORIDE: 107 mmol/L (ref 98–111)
CO2: 25 mmol/L (ref 22–32)
CREATININE: 0.71 mg/dL (ref 0.44–1.00)
GFR calc Af Amer: >60 mL/min (ref >60)
GFR calc non Af Amer: >60 mL/min (ref >60)
Glucose, Bld: 121 mg/dL — ABNORMAL HIGH (ref 70–99)
Potassium: 3.9 mmol/L (ref 3.5–5.1)
SODIUM: 140 mmol/L (ref 135–145)

## 2017-11-28 NOTE — Progress Notes (Signed)
PATIENT STATED SHE SAW DR. Lindi Adie FOR HER VON WILLEBRAND'S AND SHOULD BE GETTING DDAVP PRIOR TO SURGERY.  SPOKE WITH ANGELA ABOUT PT HX VON WIILEBRAND'S- WILL LEAVE CHART FOR REVIEW.  SPOKE WITH KIM AT DR. Alvira Philips OFFICE WHO IS VERY AWARE THAT PATIENT SHOULD BE GETTING DDAVP AND IS TRYING TO GET ORDER PLACED FOR DAY OF SURGERY.

## 2017-11-29 ENCOUNTER — Other Ambulatory Visit: Payer: Self-pay

## 2017-11-29 ENCOUNTER — Other Ambulatory Visit: Payer: Self-pay | Admitting: *Deleted

## 2017-11-29 DIAGNOSIS — D6800 Von Willebrand disease, unspecified: Secondary | ICD-10-CM

## 2017-11-29 MED ORDER — SODIUM CHLORIDE 0.9 % IV SOLN: 20.0000 ug | INTRAVENOUS | Status: DC

## 2017-11-29 NOTE — Progress Notes (Signed)
Anesthesia Chart Review:   Case:  [id number] Date/Time:  12/05/17 0715   Procedures:      REMOVAL RIGHT  BREAST IMPLANT (Right )     CAPSULECTOMY (Right )     BREAST RECONSTRUCTION FOR SYMMETRY (Right )   Anesthesia type:  General   Pre-op diagnosis:  BREAST CANCER   Location:  [place] OR ROOM 09 / [place] OR   Surgeon:  Crissie Reese, MD      DISCUSSION: - Pt is a 67 year old female with hx possible Von Willebrand's disease, stroke.   - Per hem-onc, pt is to receive DDAVP 20 mcg IV over 30 minutes, administered 30 minutes prior to surgery - Kim at Dr. Harlow Mares' office is working on getting order for DDAVP in Epic   VS: BP 127/70   Pulse 88   Temp 36.7 C   Resp 20   Ht 5\' 5"  (1.651 m)   Wt 131 lb 9.6 oz (59.7 kg)   SpO2 96%   BMI 21.90 kg/m    PROVIDERS: - PCP is Susy Frizzle, MD   - Hem-onc is Nicholas Lose, MD.  Dr,. Gudena recommends " patient receive DDAVP 20 mcg IV given over 30 minutes.  This should be done 30 minutes prior to her surgery.  I will discuss this with Dr. Harlow Mares". PRN f/u recommended    LABS: Labs reviewed: Acceptable for surgery. (all labs ordered are listed, but only abnormal results are displayed)  Labs Reviewed  BASIC METABOLIC PANEL - Abnormal; Notable for the following components:      Result Value   Glucose, Bld 121 (*)    All other components within normal limits  CBC    EKG: N/A   CV:  Carotid duplex 10/22/12:  1.  Bilateral bulb/proximal ICAs: Demonstrated a small mild to moderate fibrous plaque with no evidence of significant diameter reduction, tortuosity or other vascular abnormality.  Echo  10/22/12:  - Left ventricle: The cavity size was normal. Systolic function was normal. The estimated ejection fraction was in the range of 55% to 60%. Wall motion was normal; there were no regional wall motion abnormalities. Left ventricular diastolic function parameters were normal.   Past Medical History:  Diagnosis Date  . Blood dyscrasia   .  Breast cancer ([place])    left  . Complication of anesthesia    1980S  TUBAL LIG, + HEMM SURGERY PT PASSED OUT AND WENT INTO SHOCK.  NO PROBLEMS SINCE   . 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 Endometriosis   . Fibromyalgia   . GERD (gastroesophageal reflux disease)   . Hypercholesteremia   . Mini stroke ([place]) 09/2012  . Radiculopathy   . Stroke [facility])    MINI STROKE  . Von Willebrand disease ([place]) 09/11/2013  . Von Willebrand's disease [facility])    history of    Past Surgical History:  Procedure Laterality Date  . ABDOMINAL HYSTERECTOMY    . BACK SURGERY    . BREAST RECONSTRUCTION    . CARPAL TUNNEL RELEASE     right  . FINGER SURGERY     LEFT THUMB  . HEMORRHOID SURGERY     X2  . LAMINECTOMY    . MASTECTOMY     left  . SHOULDER SURGERY    . SKIN CANCER EXCISION    . TONSILLECTOMY    . TUBAL LIGATION      MEDICATIONS: . acetaminophen (TYLENOL) 500 MG tablet  . acyclovir (ZOVIRAX) 400  MG tablet  . ALPRAZolam (XANAX) 0.5 MG tablet  . aspirin 81 MG tablet  . calcium carbonate (OS-CAL) 600 MG TABS  . cholecalciferol (VITAMIN D) 1000 UNITS tablet  . denosumab (PROLIA) 60 MG/ML SOSY injection  . doxycycline (MONODOX) 100 MG capsule  . fluticasone (FLONASE) 50 MCG/ACT nasal spray  . HYDROmorphone (DILAUDID) 2 MG tablet  . loratadine (CLARITIN) 10 MG tablet  . methocarbamol (ROBAXIN) 500 MG tablet  . Multiple Vitamin (MULTIVITAMIN WITH MINERALS) TABS tablet  . Multiple Vitamins-Minerals (PRESERVISION AREDS PO)  . pantoprazole (PROTONIX) 40 MG tablet  . ranitidine (ZANTAC) 150 MG tablet  . rosuvastatin (CRESTOR) 20 MG tablet   . denosumab (PROLIA) injection 60 mg    If no changes, I anticipate pt can proceed with surgery as scheduled.   Willeen Cass, FNP-BC [facility] Short Stay Surgical Center/Anesthesiology Phone: [phone] 11/29/2017 10:28 AM

## 2017-11-29 NOTE — Addendum Note (Signed)
Addended by: Tora Kindred on: 11/29/2017 05:26 PM   Modules accepted: Orders

## 2017-12-04 ENCOUNTER — Ambulatory Visit (HOSPITAL_COMMUNITY): Payer: Self-pay | Admitting: Plastic Surgery

## 2017-12-04 MED ORDER — SODIUM CHLORIDE 0.9 % IV SOLN: 0.3000 ug/kg | Freq: Once | INTRAVENOUS | Status: DC

## 2017-12-05 ENCOUNTER — Ambulatory Visit (HOSPITAL_COMMUNITY)
Admission: RE | Admit: 2017-12-05 | Discharge: 2017-12-06 | Disposition: A | Discharge status: Home / Self Care | Payer: Medicare Other | Source: Ambulatory Visit | Attending: Plastic Surgery | Admitting: Plastic Surgery

## 2017-12-05 ENCOUNTER — Other Ambulatory Visit: Payer: Self-pay

## 2017-12-05 ENCOUNTER — Encounter (HOSPITAL_COMMUNITY): Payer: Self-pay | Admitting: Surgery

## 2017-12-05 ENCOUNTER — Ambulatory Visit (HOSPITAL_COMMUNITY): Payer: Medicare Other | Admitting: Certified Registered"

## 2017-12-05 ENCOUNTER — Encounter (HOSPITAL_COMMUNITY)
Admission: RE | Disposition: A | Discharge status: Home / Self Care | Payer: Self-pay | Source: Ambulatory Visit | Attending: Plastic Surgery

## 2017-12-05 ENCOUNTER — Ambulatory Visit (HOSPITAL_COMMUNITY): Payer: Medicare Other | Admitting: Emergency Medicine

## 2017-12-05 DIAGNOSIS — Z7982 Long term (current) use of aspirin: Secondary | ICD-10-CM | POA: Insufficient documentation

## 2017-12-05 DIAGNOSIS — Z888 Allergy status to other drugs, medicaments and biological substances status: Secondary | ICD-10-CM | POA: Insufficient documentation

## 2017-12-05 DIAGNOSIS — M797 Fibromyalgia: Secondary | ICD-10-CM | POA: Diagnosis not present

## 2017-12-05 DIAGNOSIS — Z8673 Personal history of transient ischemic attack (TIA), and cerebral infarction without residual deficits: Secondary | ICD-10-CM | POA: Diagnosis not present

## 2017-12-05 DIAGNOSIS — T8543XA Leakage of breast prosthesis and implant, initial encounter: Secondary | ICD-10-CM | POA: Diagnosis not present

## 2017-12-05 DIAGNOSIS — Z886 Allergy status to analgesic agent status: Secondary | ICD-10-CM | POA: Diagnosis not present

## 2017-12-05 DIAGNOSIS — T8549XD Other mechanical complication of breast prosthesis and implant, subsequent encounter: Secondary | ICD-10-CM | POA: Diagnosis not present

## 2017-12-05 DIAGNOSIS — M810 Age-related osteoporosis without current pathological fracture: Secondary | ICD-10-CM | POA: Diagnosis not present

## 2017-12-05 DIAGNOSIS — D6800 Von Willebrand disease, unspecified: Secondary | ICD-10-CM

## 2017-12-05 DIAGNOSIS — E7800 Pure hypercholesterolemia, unspecified: Secondary | ICD-10-CM | POA: Insufficient documentation

## 2017-12-05 DIAGNOSIS — T8549XA Other mechanical complication of breast prosthesis and implant, initial encounter: Secondary | ICD-10-CM | POA: Diagnosis not present

## 2017-12-05 DIAGNOSIS — Z79899 Other long term (current) drug therapy: Secondary | ICD-10-CM | POA: Diagnosis not present

## 2017-12-05 DIAGNOSIS — Z91040 Latex allergy status: Secondary | ICD-10-CM | POA: Insufficient documentation

## 2017-12-05 DIAGNOSIS — K219 Gastro-esophageal reflux disease without esophagitis: Secondary | ICD-10-CM | POA: Diagnosis not present

## 2017-12-05 DIAGNOSIS — X58XXXA Exposure to other specified factors, initial encounter: Secondary | ICD-10-CM | POA: Insufficient documentation

## 2017-12-05 DIAGNOSIS — E785 Hyperlipidemia, unspecified: Secondary | ICD-10-CM | POA: Insufficient documentation

## 2017-12-05 DIAGNOSIS — Z853 Personal history of malignant neoplasm of breast: Secondary | ICD-10-CM

## 2017-12-05 HISTORY — PX: BREAST RECONSTRUCTION: SHX9

## 2017-12-05 HISTORY — PX: BREAST IMPLANT REMOVAL: SHX5361

## 2017-12-05 HISTORY — PX: BREAST IMPLANT REMOVAL: SUR1101

## 2017-12-05 SURGERY — REMOVAL, IMPLANT, BREAST
Anesthesia: General | Laterality: Right

## 2017-12-05 MED ORDER — PANTOPRAZOLE SODIUM 40 MG PO TBEC
40.0000 mg | DELAYED_RELEASE_TABLET | Freq: Every day | ORAL | Status: DC
  Administered 2017-12-06: 40 mg via ORAL
  Filled 2017-12-05: qty 1

## 2017-12-05 MED ORDER — MIDAZOLAM HCL 5 MG/5ML IJ SOLN
INTRAMUSCULAR | Status: DC | PRN
  Administered 2017-12-05: 2 mg via INTRAVENOUS

## 2017-12-05 MED ORDER — SODIUM CHLORIDE 0.9 % IV SOLN
INTRAVENOUS | Status: DC | PRN
  Administered 2017-12-05: 08:00:00

## 2017-12-05 MED ORDER — FENTANYL CITRATE (PF) 250 MCG/5ML IJ SOLN
INTRAMUSCULAR | Status: AC
  Filled 2017-12-05: qty 5

## 2017-12-05 MED ORDER — SODIUM CHLORIDE 0.9 % IV SOLN
INTRAVENOUS | Status: DC
  Filled 2017-12-05: qty 1

## 2017-12-05 MED ORDER — PROPOFOL 10 MG/ML IV BOLUS
INTRAVENOUS | Status: DC | PRN
  Administered 2017-12-05: 150 mg via INTRAVENOUS
  Administered 2017-12-05: 50 mg via INTRAVENOUS

## 2017-12-05 MED ORDER — 0.9 % SODIUM CHLORIDE (POUR BTL) OPTIME
TOPICAL | Status: DC | PRN
  Administered 2017-12-05 (×2): 1000 mL

## 2017-12-05 MED ORDER — ONDANSETRON HCL 4 MG/2ML IJ SOLN
INTRAMUSCULAR | Status: DC | PRN
  Administered 2017-12-05: 4 mg via INTRAVENOUS

## 2017-12-05 MED ORDER — HYDROMORPHONE HCL 2 MG PO TABS
2.0000 mg | ORAL_TABLET | ORAL | Status: DC | PRN
  Administered 2017-12-05 (×3): 2 mg via ORAL
  Filled 2017-12-05 (×3): qty 1

## 2017-12-05 MED ORDER — ACETAMINOPHEN 500 MG PO TABS
1000.0000 mg | ORAL_TABLET | Freq: Every day | ORAL | Status: DC | PRN
  Administered 2017-12-05 (×2): 1000 mg via ORAL
  Filled 2017-12-05 (×2): qty 2

## 2017-12-05 MED ORDER — SODIUM CHLORIDE 0.9 % IV SOLN
20.0000 ug | INTRAVENOUS | Status: AC
  Administered 2017-12-05: 20 ug via INTRAVENOUS
  Filled 2017-12-05: qty 5

## 2017-12-05 MED ORDER — FAMOTIDINE 20 MG PO TABS
20.0000 mg | ORAL_TABLET | Freq: Two times a day (BID) | ORAL | Status: DC
  Administered 2017-12-05 – 2017-12-06 (×2): 20 mg via ORAL
  Filled 2017-12-05 (×2): qty 1

## 2017-12-05 MED ORDER — BACITRACIN ZINC 500 UNIT/GM EX OINT
TOPICAL_OINTMENT | CUTANEOUS | Status: DC | PRN
  Administered 2017-12-05: 1 via TOPICAL

## 2017-12-05 MED ORDER — LACTATED RINGERS IV SOLN
INTRAVENOUS | Status: DC | PRN
  Administered 2017-12-05: 08:00:00 via INTRAVENOUS

## 2017-12-05 MED ORDER — DOXYCYCLINE HYCLATE 100 MG PO TABS
100.0000 mg | ORAL_TABLET | Freq: Two times a day (BID) | ORAL | Status: DC
  Administered 2017-12-05 – 2017-12-06 (×3): 100 mg via ORAL
  Filled 2017-12-05 (×3): qty 1

## 2017-12-05 MED ORDER — ACETAMINOPHEN 10 MG/ML IV SOLN: 1000.0000 mg | Freq: Once | INTRAVENOUS | Status: DC | PRN

## 2017-12-05 MED ORDER — CHLORHEXIDINE GLUCONATE CLOTH 2 % EX PADS: 6.0000 | MEDICATED_PAD | Freq: Once | CUTANEOUS | Status: DC

## 2017-12-05 MED ORDER — CEFAZOLIN SODIUM-DEXTROSE 1-4 GM/50ML-% IV SOLN
INTRAVENOUS | Status: DC | PRN
  Administered 2017-12-05: 2 g via INTRAVENOUS

## 2017-12-05 MED ORDER — MIDAZOLAM HCL 2 MG/2ML IJ SOLN
INTRAMUSCULAR | Status: AC
  Filled 2017-12-05: qty 2

## 2017-12-05 MED ORDER — DEXAMETHASONE SODIUM PHOSPHATE 10 MG/ML IJ SOLN
INTRAMUSCULAR | Status: DC | PRN
  Administered 2017-12-05: 10 mg via INTRAVENOUS

## 2017-12-05 MED ORDER — BACITRACIN ZINC 500 UNIT/GM EX OINT
TOPICAL_OINTMENT | CUTANEOUS | Status: AC
  Filled 2017-12-05: qty 28.35

## 2017-12-05 MED ORDER — PROMETHAZINE HCL 25 MG/ML IJ SOLN: 6.2500 mg | INTRAMUSCULAR | Status: DC | PRN

## 2017-12-05 MED ORDER — LORATADINE 10 MG PO TABS
10.0000 mg | ORAL_TABLET | Freq: Every day | ORAL | Status: DC
  Administered 2017-12-05 – 2017-12-06 (×2): 10 mg via ORAL
  Filled 2017-12-05 (×2): qty 1

## 2017-12-05 MED ORDER — SODIUM CHLORIDE 0.9 % IV SOLN
INTRAVENOUS | Status: DC | PRN
  Administered 2017-12-05: 30 ug/min via INTRAVENOUS

## 2017-12-05 MED ORDER — OXYCODONE HCL 5 MG PO TABS: 5.0000 mg | ORAL_TABLET | Freq: Once | ORAL | Status: DC | PRN

## 2017-12-05 MED ORDER — HYDROMORPHONE HCL 1 MG/ML IJ SOLN: 0.2500 mg | INTRAMUSCULAR | Status: DC | PRN

## 2017-12-05 MED ORDER — SUGAMMADEX SODIUM 200 MG/2ML IV SOLN
INTRAVENOUS | Status: DC | PRN
  Administered 2017-12-05: 240 mg via INTRAVENOUS

## 2017-12-05 MED ORDER — CEFAZOLIN SODIUM-DEXTROSE 2-4 GM/100ML-% IV SOLN
2.0000 g | INTRAVENOUS | Status: DC
  Filled 2017-12-05: qty 100

## 2017-12-05 MED ORDER — OXYCODONE HCL 5 MG/5ML PO SOLN: 5.0000 mg | Freq: Once | ORAL | Status: DC | PRN

## 2017-12-05 MED ORDER — ROCURONIUM BROMIDE 100 MG/10ML IV SOLN
INTRAVENOUS | Status: DC | PRN
  Administered 2017-12-05: 50 mg via INTRAVENOUS

## 2017-12-05 MED ORDER — FLUTICASONE PROPIONATE 50 MCG/ACT NA SUSP
1.0000 | Freq: Every day | NASAL | Status: DC
  Filled 2017-12-05: qty 16

## 2017-12-05 MED ORDER — FENTANYL CITRATE (PF) 250 MCG/5ML IJ SOLN
INTRAMUSCULAR | Status: DC | PRN
  Administered 2017-12-05: 50 ug via INTRAVENOUS
  Administered 2017-12-05: 100 ug via INTRAVENOUS

## 2017-12-05 MED ORDER — PROPOFOL 10 MG/ML IV BOLUS
INTRAVENOUS | Status: AC
  Filled 2017-12-05: qty 20

## 2017-12-05 MED ORDER — DEXTROSE-NACL 5-0.45 % IV SOLN
INTRAVENOUS | Status: DC
  Administered 2017-12-05 (×2): via INTRAVENOUS

## 2017-12-05 SURGICAL SUPPLY — 63 items
ADH SKN CLS APL DERMABOND .7 (GAUZE/BANDAGES/DRESSINGS) ×1
APPLIER CLIP 9.375 MED OPEN (MISCELLANEOUS)
APR CLP MED 9.3 20 MLT OPN (MISCELLANEOUS)
ATCH SMKEVC FLXB CAUT HNDSWH (FILTER) ×1 IMPLANT
BAG DECANTER FOR FLEXI CONT (MISCELLANEOUS) ×2 IMPLANT
BINDER BREAST LRG (GAUZE/BANDAGES/DRESSINGS) ×2 IMPLANT
BIOPATCH RED 1 DISK 7.0 (GAUZE/BANDAGES/DRESSINGS) ×3 IMPLANT
BLADE 10 SAFETY STRL DISP (BLADE) ×2 IMPLANT
BLADE SURG 10 STRL SS (BLADE) ×2 IMPLANT
CANISTER SUCT 3000ML PPV (MISCELLANEOUS) ×2 IMPLANT
CHLORAPREP W/TINT 26ML (MISCELLANEOUS) ×2 IMPLANT
CLIP APPLIE 9.375 MED OPEN (MISCELLANEOUS) IMPLANT
COVER SURGICAL LIGHT HANDLE (MISCELLANEOUS) ×2 IMPLANT
DERMABOND ADVANCED (GAUZE/BANDAGES/DRESSINGS) ×1
DERMABOND ADVANCED .7 DNX12 (GAUZE/BANDAGES/DRESSINGS) ×1 IMPLANT
DRAIN CHANNEL 19F RND (DRAIN) ×2 IMPLANT
DRAPE HALF SHEET 40X57 (DRAPES) ×6 IMPLANT
DRAPE LAPAROSCOPIC ABDOMINAL (DRAPES) ×2 IMPLANT
DRAPE ORTHO SPLIT 77X108 STRL (DRAPES) ×4
DRAPE SURG 17X23 STRL (DRAPES) ×8 IMPLANT
DRAPE SURG ORHT 6 SPLT 77X108 (DRAPES) ×2 IMPLANT
DRAPE UTILITY XL STRL (DRAPES) ×4 IMPLANT
DRAPE WARM FLUID 44X44 (DRAPE) ×2 IMPLANT
DRSG SORBAVIEW 3.5X5-5/16 MED (GAUZE/BANDAGES/DRESSINGS) ×2 IMPLANT
DRSG TEGADERM 2-3/8X2-3/4 SM (GAUZE/BANDAGES/DRESSINGS) ×2 IMPLANT
ELECT BLADE 6.5 EXT (BLADE) ×2 IMPLANT
ELECT CAUTERY BLADE 6.4 (BLADE) ×2 IMPLANT
ELECT REM PT RETURN 9FT ADLT (ELECTROSURGICAL) ×2
ELECTRODE REM PT RTRN 9FT ADLT (ELECTROSURGICAL) ×1 IMPLANT
EVACUATOR SILICONE 100CC (DRAIN) ×2 IMPLANT
EVACUATOR SMOKE ACCUVAC VALLEY (FILTER) ×1
GAUZE SPONGE 4X4 12PLY STRL LF (GAUZE/BANDAGES/DRESSINGS) ×2 IMPLANT
GAUZE SPONGE 4X4 16PLY XRAY LF (GAUZE/BANDAGES/DRESSINGS) ×1 IMPLANT
GLOVE BIO SURGEON STRL SZ7.5 (GLOVE) ×2 IMPLANT
GLOVE BIOGEL PI IND STRL 8 (GLOVE) ×1 IMPLANT
GLOVE BIOGEL PI INDICATOR 8 (GLOVE) ×1
GOWN STRL REUS W/ TWL LRG LVL3 (GOWN DISPOSABLE) ×1 IMPLANT
GOWN STRL REUS W/ TWL XL LVL3 (GOWN DISPOSABLE) ×1 IMPLANT
GOWN STRL REUS W/TWL LRG LVL3 (GOWN DISPOSABLE) ×2
GOWN STRL REUS W/TWL XL LVL3 (GOWN DISPOSABLE) ×2
IMPL GEL 275CC (Breast) IMPLANT
IMPLANT GEL 275CC (Breast) ×2 IMPLANT
KIT BASIN OR (CUSTOM PROCEDURE TRAY) ×2 IMPLANT
KIT TURNOVER KIT B (KITS) ×2 IMPLANT
MARKER SKIN DUAL TIP RULER LAB (MISCELLANEOUS) ×2 IMPLANT
NS IRRIG 1000ML POUR BTL (IV SOLUTION) ×4 IMPLANT
PACK GENERAL/GYN (CUSTOM PROCEDURE TRAY) ×2 IMPLANT
PAD ABD 8X10 STRL (GAUZE/BANDAGES/DRESSINGS) ×3 IMPLANT
PAD ARMBOARD 7.5X6 YLW CONV (MISCELLANEOUS) ×2 IMPLANT
PREFILTER EVAC NS 1 1/3-3/8IN (MISCELLANEOUS) ×2 IMPLANT
SOLUTION BETADINE 4OZ (MISCELLANEOUS) ×1 IMPLANT
STAPLER VISISTAT 35W (STAPLE) IMPLANT
SUT MNCRL AB 3-0 PS2 18 (SUTURE) ×3 IMPLANT
SUT PDS 0 CT 1 18  CR/8 (SUTURE) IMPLANT
SUT PDS AB 2-0 CT1 27 (SUTURE) IMPLANT
SUT PDS AB 3-0 SH 27 (SUTURE) IMPLANT
SUT PROLENE 3 0 PS 2 (SUTURE) ×2 IMPLANT
SUT VIC AB 3-0 SH 18 (SUTURE) ×2 IMPLANT
SYR BULB IRRIGATION 50ML (SYRINGE) ×4 IMPLANT
TOWEL OR 17X24 6PK STRL BLUE (TOWEL DISPOSABLE) ×2 IMPLANT
TOWEL OR 17X26 10 PK STRL BLUE (TOWEL DISPOSABLE) ×2 IMPLANT
TRAY FOLEY MTR SLVR 16FR STAT (SET/KITS/TRAYS/PACK) IMPLANT
TUBE CONNECTING 12X1/4 (SUCTIONS) ×2 IMPLANT

## 2017-12-05 NOTE — Anesthesia Procedure Notes (Signed)
Procedure Name: Intubation Date/Time: 12/05/2017 7:47 AM Performed by: Cleda Daub, CRNA Pre-anesthesia Checklist: Patient identified, Emergency Drugs available, Suction available and Patient being monitored Patient Re-evaluated:Patient Re-evaluated prior to induction Oxygen Delivery Method: Circle system utilized Preoxygenation: Pre-oxygenation with 100% oxygen Induction Type: IV induction Ventilation: Mask ventilation without difficulty, Mask ventilation throughout procedure and Oral airway inserted - appropriate to patient size Laryngoscope Size: Glidescope (grade 3 view with Mac 3.  Dr. singer looked with miller 2 but unsuccessful; grade 1 view with glidescope.) Grade View: Grade I Tube type: Oral Tube size: 7.0 mm Number of attempts: 1 Airway Equipment and Method: Stylet and Video-laryngoscopy Placement Confirmation: ETT inserted through vocal cords under direct vision,  positive ETCO2 and breath sounds checked- equal and bilateral Secured at: 21 cm Tube secured with: Tape Dental Injury: Teeth and Oropharynx as per pre-operative assessment  Difficulty Due To: Difficulty was unanticipated Comments: Great airway exam in preop; easy mask ventilation; anterior airway with DL; recommend glidescope for the future.

## 2017-12-05 NOTE — Op Note (Signed)
NAME: Valdez, Caroline W. MEDICAL RECORD ZO:[id number] ACCOUNT [id number] DATE OF BIRTH:11-24-1950 FACILITY: [facility] LOCATION: [facility] PHYSICIAN:Betzabeth Derringer Ann Held, MD  OPERATIVE REPORT  DATE OF PROCEDURE:  12/05/2017  PREOPERATIVE DIAGNOSES: 1.  Personal history of breast cancer. 2.  Mechanical complication of right breast implant.  POSTOPERATIVE DIAGNOSES:   1.  Personal history of breast cancer. 2.  Mechanical complication of right breast implant.  PROCEDURE PERFORMED: 1.  A right delayed breast reconstruction with silicone gel implant. 2.  Removal of right implant material.  SURGEON:  Crissie Reese, MD  ASSISTANT:  Judyann Munson, RNFA.  ANESTHESIA:  General.  ESTIMATED BLOOD LOSS:  5 mL.  DRAINS:  One #19 [place].  CLINICAL NOTE:  This 67 year old woman had a left breast cancer and underwent a mastectomy followed by placement of tissue expander and reconstruction.  At the time of her reconstruction, she did have a right breast augmentation for symmetry.  She has  had studies on the right breast and recently a mammogram picked up a rupture of the right breast implant.  An MRI was performed and did confirm an intracapsular rupture of the right breast implant.  The patient was experiencing some pain on the right  side and the removal of this implant material and replacement with a silicone gel implant was discussed with her.  She did elect to replace with silicone gel understanding that the FDA does recommend an MRI every 2 years with silicone gel in place.   Petra Kuba of procedure, risks and possible complications were discussed with her in detail and she understood all these and wished to proceed.  DESCRIPTION OF PROCEDURE:  The patient was marked in the holding area in a full upright position.  She was then taken to the operating room and placed supine.  After successful general anesthesia, she was prepped with ChloraPrep and after waiting 3  minutes, she was draped with sterile  drapes.  A nipple shield was used on the right side.  The left side was a reconstructed nipple.  The old inframammary crease incision was utilized and the dissection carried down through the subcutaneous tissue.  The  underlying muscle was opened using a muscle splitting incision and the underlying capsule was identified.  The capsule was entered and the implant was found to be ruptured.  The implant material was removed.  Thorough irrigation with saline.  The capsule  was wiped thoroughly using a series of Ray-Tecs followed by ABDs.  The capsule was inspected and found to be entirely normal.  The capsule was a little bit thin.  It was noted that the space had lateralized slightly and a "popcorn" capsulorrhaphy was  performed in the lateral gutter in order to narrow the space slightly in the lateral aspect.  A thorough irrigation again with saline.  Antibiotic solution was placed and allowed to dwell in the space.  A 19-French drain was positioned and brought out  through separate stab wound inferiorly and secured with a 3-0 Prolene suture.  After thorough irrigation with saline, the skin edges were prepped with Betadine.  After changing gloves again the implant that had been soaked in antibiotic solution was then  positioned.This was a Mentor smooth round 409 cc silicone gel implant.  Antibiotic solution was placed in the space just prior to positioning the implant and Betadine was placed along with the antibiotic solution.  The implant having been positioned, it was checked and was noted to be oriented properly.  Again,  antibiotic solution and Betadine used  to irrigate the wound and the capsule and muscle closed with 3-0 Vicryl simple interrupted sutures with great care taken to avoid the underlying implant, which was kept under direct vision at all times.  The wound  again irrigated with antibiotic solution.  The wound edges were debrided sharply to remove the old scar and the closure with 3-0 Monocryl  interrupted inverted deep dermal sutures followed by Dermabond.  The drain was dressed with a Biopatch and a  SorbaView dressing and a dry sterile dressing was placed over the incision and the breast vest was positioned.  She was transferred to the recovery room stable, having tolerated the procedure well and she will be observed in the hospital overnight.  TN/NUANCE  D:12/05/2017 T:12/05/2017 JOB:[id number]

## 2017-12-05 NOTE — Anesthesia Postprocedure Evaluation (Signed)
Anesthesia Post Note  Patient: Caroline Valdez  Procedure(s) Performed: REMOVAL RIGHT  BREAST IMPLANT MATERIAL (Right ) DELAYED BREAST RECONSTRUCTION WITH SILICONE IMPLANT (Right )     Patient location during evaluation: PACU Anesthesia Type: General Level of consciousness: awake and alert Pain management: pain level controlled Vital Signs Assessment: post-procedure vital signs reviewed and stable Respiratory status: spontaneous breathing, nonlabored ventilation, respiratory function stable and patient connected to nasal cannula oxygen Cardiovascular status: blood pressure returned to baseline and stable Postop Assessment: no apparent nausea or vomiting Anesthetic complications: no    Last Vitals:  Vitals:   12/05/17 1014 12/05/17 1122  BP: (!) 165/74   Pulse: 75   Resp: 16   Temp:  36.6 C  SpO2: 97%     Last Pain:  Vitals:   12/05/17 1122  TempSrc: Oral  PainSc:                  [name]

## 2017-12-05 NOTE — Anesthesia Preprocedure Evaluation (Signed)
Anesthesia Evaluation  Patient identified by MRN, date of birth, ID band Patient awake    Reviewed: Allergy & Precautions, NPO status , Patient's Chart, lab work & pertinent test results  Airway Mallampati: II  TM Distance: >3 FB Neck ROM: Full    Dental no notable dental hx.    Pulmonary neg pulmonary ROS,    Pulmonary exam normal breath sounds clear to auscultation       Cardiovascular negative cardio ROS Normal cardiovascular exam Rhythm:Regular Rate:Normal     Neuro/Psych TIAnegative psych ROS   GI/Hepatic negative GI ROS, Neg liver ROS,   Endo/Other  negative endocrine ROS  Renal/GU negative Renal ROS  negative genitourinary   Musculoskeletal negative musculoskeletal ROS (+)   Abdominal   Peds negative pediatric ROS (+)  Hematology  (+) Blood dyscrasia, ,   Anesthesia Other Findings   Reproductive/Obstetrics negative OB ROS                             Anesthesia Physical Anesthesia Plan  ASA: III  Anesthesia Plan: General   Post-op Pain Management:    Induction: Intravenous  PONV Risk Score and Plan: 2 and Ondansetron, Dexamethasone and Treatment may vary due to age or medical condition  Airway Management Planned: LMA  Additional Equipment:   Intra-op Plan:   Post-operative Plan: Extubation in OR  Informed Consent: I have reviewed the patients History and Physical, chart, labs and discussed the procedure including the risks, benefits and alternatives for the proposed anesthesia with the patient or authorized representative who has indicated his/her understanding and acceptance.   Dental advisory given  Plan Discussed with: CRNA and Surgeon  Anesthesia Plan Comments:         Anesthesia Quick Evaluation

## 2017-12-05 NOTE — Brief Op Note (Signed)
12/05/2017  9:07 AM  PATIENT:  Caroline Valdez  67 y.o. female  PRE-OPERATIVE DIAGNOSIS:  BREAST CANCER  PERSONAL HISTORY OF BREAST CANCER AND RIGHT MECHANICAL FAILURE OF BREAST IMPLANT  POST-OPERATIVE DIAGNOSIS:  BREAST CANCER  PERSONAL HISTORY OF BREAST CANCER AND RIGHT MECHANICAL FAILURE OF BREAST IMPLANT  PROCEDURE:  Procedure(s): REMOVAL RIGHT  BREAST IMPLANT MATERIAL (Right) DELAYED BREAST RECONSTRUCTION WITH SILICONE IMPLANT (Right)  SURGEON:  Surgeon(s) and Role:    Crissie Reese, MD - Primary  PHYSICIAN ASSISTANT:   ASSISTANTS: Judyann Munson, RNFA   ANESTHESIA:   general  EBL:  5 cc   BLOOD ADMINISTERED:none  DRAINS: (1) Jackson-Pratt drain(s) with closed bulb suction in the right chest   LOCAL MEDICATIONS USED:  NONE  SPECIMEN:  No Specimen  DISPOSITION OF SPECIMEN:  N/A  COUNTS:  YES  TOURNIQUET:  * No tourniquets in log *  DICTATION: .Other Dictation: Dictation Number [id number]  PLAN OF CARE: Admit for overnight observation  PATIENT DISPOSITION:  PACU - hemodynamically stable.   Delay start of Pharmacological VTE agent (>24hrs) due to surgical blood loss or risk of bleeding: not applicable

## 2017-12-05 NOTE — Transfer of Care (Signed)
Immediate Anesthesia Transfer of Care Note  Patient: Caroline Valdez  Procedure(s) Performed: REMOVAL RIGHT  BREAST IMPLANT MATERIAL (Right ) DELAYED BREAST RECONSTRUCTION WITH SILICONE IMPLANT (Right )  Patient Location: PACU  Anesthesia Type:General  Level of Consciousness: awake, alert , oriented and patient cooperative  Airway & Oxygen Therapy: Patient Spontanous Breathing and Patient connected to face mask oxygen  Post-op Assessment: Report given to RN and Post -op Vital signs reviewed and stable  Post vital signs: Reviewed and stable  Last Vitals:  Vitals Value Taken Time  BP 152/75 12/05/2017  9:32 AM  Temp    Pulse 85 12/05/2017  9:32 AM  Resp 14 12/05/2017  9:32 AM  SpO2 98 % 12/05/2017  9:32 AM  Vitals shown include unvalidated device data.  Last Pain:  Vitals:   12/05/17 0606  TempSrc:   PainSc: 0-No pain      Patients Stated Pain Goal: 3 (15/94/58 5929)  Complications: No apparent anesthesia complications

## 2017-12-05 NOTE — H&P (Signed)
I have re-examined and re-evaluated the patient and there are no changes.  See office notes in paper chart for H&P. 

## 2017-12-06 ENCOUNTER — Encounter (HOSPITAL_COMMUNITY): Payer: Self-pay | Admitting: Plastic Surgery

## 2017-12-06 DIAGNOSIS — Z853 Personal history of malignant neoplasm of breast: Secondary | ICD-10-CM | POA: Diagnosis not present

## 2017-12-06 DIAGNOSIS — E785 Hyperlipidemia, unspecified: Secondary | ICD-10-CM | POA: Diagnosis not present

## 2017-12-06 DIAGNOSIS — M797 Fibromyalgia: Secondary | ICD-10-CM | POA: Diagnosis not present

## 2017-12-06 DIAGNOSIS — Z8673 Personal history of transient ischemic attack (TIA), and cerebral infarction without residual deficits: Secondary | ICD-10-CM | POA: Diagnosis not present

## 2017-12-06 DIAGNOSIS — K219 Gastro-esophageal reflux disease without esophagitis: Secondary | ICD-10-CM | POA: Diagnosis not present

## 2017-12-06 DIAGNOSIS — T8543XA Leakage of breast prosthesis and implant, initial encounter: Secondary | ICD-10-CM | POA: Diagnosis not present

## 2017-12-06 NOTE — Discharge Instructions (Signed)
No lifting for 6 weeks No vigorous activity for 6 weeks (including outdoor walks) No driving for 4 weeks OK to walk up stairs slowly Stay propped up Use incentive spirometer at home every hour while awake No shower while drains are in place Empty drains at least three times a day and record the amounts separately Take an over-the-counter Probiotic while on antibiotics Take an over-the-counter stool softener (such as Colace) while on pain medication Call Monday and report drain output. It may be okay to remove the drain Monday. For questions call [phone] or [phone] after hours

## 2017-12-06 NOTE — Discharge Summary (Signed)
Physician Discharge Summary  Patient ID: Caroline Valdez MRN: [id number] DOB/AGE: 22-Jan-1951 67 y.o.  Admit date: 12/05/2017 Discharge date: 12/06/2017  Admission Diagnoses: Personal history of breast cancer        Mechanical complication of breast prosthesis  Discharge Diagnoses: Same Active Problems:   Personal history of breast cancer   Discharged Condition: good  Hospital Course: On the day of admission the patient was taken to surgery and had removal of right breast implant material and delayed right breast reconstruction with silicone gel implant.. The patient tolerated the procedures well.  The patient was ambulatory and tolerating diet on the first postoperative day. No evidence of bleeding overnight and no DDAVP needed post-op.   Treatments: antibiotics: Ancef, anticoagulation: none and surgery: Right breast removal of implant material and reconstruction with silicone gel implant.  Discharge Exam: Blood pressure (!) 98/58, pulse 75, temperature 98.3 F (36.8 C), temperature source Oral, resp. rate 16, height 5\' 5"  (1.651 m), weight 134 lb (60.8 kg), SpO2 98 %.  Operative sites: Right chest is soft. No evidence of bleeding or infection. Drain functioning. Drainage thin.  Disposition: Discharge disposition: 01-Home or Self Care        Allergies as of 12/06/2017      Reactions   Aspirin Other (See Comments)   Pt has Von Willebrands Disease   Tape Dermatitis   Pulls off skin      Medication List    STOP taking these medications   aspirin 81 MG tablet   denosumab 60 MG/ML Sosy injection Commonly known as:  PROLIA   PRESERVISION AREDS PO     TAKE these medications   acetaminophen 500 MG tablet Commonly known as:  TYLENOL Take 1,000 mg by mouth daily as needed for headache.   acyclovir 400 MG tablet Commonly known as:  ZOVIRAX Take 1 tablet (400 mg total) by mouth 3 (three) times daily. What changed:    when to take this  reasons to take this    ALPRAZolam 0.5 MG tablet Commonly known as:  XANAX Take 1 tablet (0.5 mg total) by mouth at bedtime as needed. What changed:  reasons to take this   calcium carbonate 600 MG Tabs tablet Commonly known as:  OS-CAL Take 600 mg by mouth daily.   cholecalciferol 1000 units tablet Commonly known as:  VITAMIN D Take 1,000 Units by mouth daily.   doxycycline 100 MG capsule Commonly known as:  MONODOX Take 100 mg by mouth 2 (two) times daily.   fluticasone 50 MCG/ACT nasal spray Commonly known as:  FLONASE Place 1 spray into both nostrils daily.   HYDROmorphone 2 MG tablet Commonly known as:  DILAUDID Take 2 mg by mouth every 4 (four) hours as needed for pain.   loratadine 10 MG tablet Commonly known as:  CLARITIN Take 10 mg by mouth daily.   methocarbamol 500 MG tablet Commonly known as:  ROBAXIN Take 500 mg by mouth 4 (four) times daily as needed for muscle spasms.   multivitamin with minerals Tabs tablet Take 1 tablet by mouth daily.   pantoprazole 40 MG tablet Commonly known as:  PROTONIX TAKE 1 TABLET DAILY   ranitidine 150 MG tablet Commonly known as:  ZANTAC Take 1 tablet (150 mg total) by mouth 2 (two) times daily.   rosuvastatin 20 MG tablet Commonly known as:  CRESTOR TAKE 1 TABLET DAILY        Signed: Kajuan Guyton M 12/06/2017, 8:44 AM

## 2017-12-06 NOTE — Progress Notes (Signed)
Discharge paperwork reviewed with patient. No questions verbalized. Patient is ready to discharge.  

## 2017-12-31 ENCOUNTER — Encounter: Payer: Self-pay | Admitting: Family Medicine

## 2018-01-22 ENCOUNTER — Other Ambulatory Visit: Payer: Self-pay | Admitting: Family Medicine

## 2018-01-22 DIAGNOSIS — E7800 Pure hypercholesterolemia, unspecified: Secondary | ICD-10-CM

## 2018-01-22 DIAGNOSIS — Z79899 Other long term (current) drug therapy: Secondary | ICD-10-CM

## 2018-01-28 ENCOUNTER — Encounter: Payer: Self-pay | Admitting: Family Medicine

## 2018-01-31 ENCOUNTER — Ambulatory Visit: Payer: Medicare Other

## 2018-01-31 ENCOUNTER — Other Ambulatory Visit: Payer: Medicare Other

## 2018-02-04 ENCOUNTER — Ambulatory Visit: Payer: Medicare Other

## 2018-02-06 ENCOUNTER — Other Ambulatory Visit: Payer: Medicare Other

## 2018-02-06 ENCOUNTER — Ambulatory Visit (INDEPENDENT_AMBULATORY_CARE_PROVIDER_SITE_OTHER): Payer: Medicare Other

## 2018-02-06 DIAGNOSIS — Z23 Encounter for immunization: Secondary | ICD-10-CM

## 2018-02-06 DIAGNOSIS — M810 Age-related osteoporosis without current pathological fracture: Secondary | ICD-10-CM

## 2018-02-06 DIAGNOSIS — Z79899 Other long term (current) drug therapy: Secondary | ICD-10-CM | POA: Diagnosis not present

## 2018-02-06 DIAGNOSIS — E7800 Pure hypercholesterolemia, unspecified: Secondary | ICD-10-CM | POA: Diagnosis not present

## 2018-02-06 MED ORDER — DENOSUMAB 60 MG/ML ~~LOC~~ SOSY
60.0000 mg | PREFILLED_SYRINGE | Freq: Once | SUBCUTANEOUS | Status: AC
  Administered 2018-02-06: 60 mg via SUBCUTANEOUS

## 2018-02-06 NOTE — Progress Notes (Signed)
Patient came in today to receive annual flu vaccine, and scheduled Prolia. Patient received the prolia in the left arm subq, and the flu vaccine in the right deltoid IM. Patient tolerated both vaccines well. VIS was given for prolia.

## 2018-02-10 ENCOUNTER — Other Ambulatory Visit: Payer: Medicare Other

## 2018-02-11 LAB — COMPREHENSIVE METABOLIC PANEL
AG Ratio: 2 (calc) (ref 1.0–2.5)
ALT: 16 U/L (ref 6–29)
AST: 24 U/L (ref 10–35)
Albumin: 4.5 g/dL (ref 3.6–5.1)
Alkaline phosphatase (APISO): 48 U/L (ref 33–130)
BUN: 16 mg/dL (ref 7–25)
CO2: 29 mmol/L (ref 20–32)
CREATININE: 0.75 mg/dL (ref 0.50–0.99)
Calcium: 9.3 mg/dL (ref 8.6–10.4)
Chloride: 106 mmol/L (ref 98–110)
GLUCOSE: 87 mg/dL (ref 65–99)
Globulin: 2.2 g/dL (calc) (ref 1.9–3.7)
Potassium: 4.5 mmol/L (ref 3.5–5.3)
Sodium: 142 mmol/L (ref 135–146)
Total Bilirubin: 0.6 mg/dL (ref 0.2–1.2)
Total Protein: 6.7 g/dL (ref 6.1–8.1)

## 2018-02-11 LAB — LIPID PANEL
CHOL/HDL RATIO: 2.5 (calc) (ref <5.0)
Cholesterol: 162 mg/dL (ref <200)
HDL: 65 mg/dL (ref >50)
LDL Cholesterol (Calc): 81 mg/dL (calc)
NON-HDL CHOLESTEROL (CALC): 97 mg/dL (ref <130)
TRIGLYCERIDES: 80 mg/dL (ref <150)

## 2018-02-11 LAB — CBC WITH DIFFERENTIAL/PLATELET
BASOS ABS: 31 {cells}/uL (ref 0–200)
BASOS PCT: 0.6 %
EOS ABS: 57 {cells}/uL (ref 15–500)
Eosinophils Relative: 1.1 %
HCT: 40.6 % (ref 35.0–45.0)
Hemoglobin: 13.9 g/dL (ref 11.7–15.5)
Lymphs Abs: 1378 cells/uL (ref 850–3900)
MCH: 31.7 pg (ref 27.0–33.0)
MCHC: 34.2 g/dL (ref 32.0–36.0)
MCV: 92.5 fL (ref 80.0–100.0)
MPV: 11.2 fL (ref 7.5–12.5)
Monocytes Relative: 9.9 %
NEUTROS ABS: 3219 {cells}/uL (ref 1500–7800)
NEUTROS PCT: 61.9 %
PLATELETS: 170 10*3/uL (ref 140–400)
RBC: 4.39 10*6/uL (ref 3.80–5.10)
RDW: 12.6 % (ref 11.0–15.0)
Total Lymphocyte: 26.5 %
WBC mixed population: 515 cells/uL (ref 200–950)
WBC: 5.2 10*3/uL (ref 3.8–10.8)

## 2018-02-24 DIAGNOSIS — L570 Actinic keratosis: Secondary | ICD-10-CM | POA: Diagnosis not present

## 2018-02-24 DIAGNOSIS — Z85828 Personal history of other malignant neoplasm of skin: Secondary | ICD-10-CM | POA: Diagnosis not present

## 2018-02-24 DIAGNOSIS — L821 Other seborrheic keratosis: Secondary | ICD-10-CM | POA: Diagnosis not present

## 2018-03-10 ENCOUNTER — Encounter: Payer: Self-pay | Admitting: Family Medicine

## 2018-03-24 DIAGNOSIS — Z853 Personal history of malignant neoplasm of breast: Secondary | ICD-10-CM | POA: Diagnosis not present

## 2018-03-31 ENCOUNTER — Other Ambulatory Visit: Payer: Self-pay

## 2018-03-31 ENCOUNTER — Encounter (HOSPITAL_COMMUNITY): Payer: Self-pay | Admitting: Emergency Medicine

## 2018-03-31 ENCOUNTER — Emergency Department (HOSPITAL_COMMUNITY): Payer: Medicare Other

## 2018-03-31 ENCOUNTER — Emergency Department (HOSPITAL_COMMUNITY)
Admission: EM | Admit: 2018-03-31 | Discharge: 2018-03-31 | Disposition: A | Discharge status: Home / Self Care | Payer: Medicare Other | Attending: Emergency Medicine | Admitting: Emergency Medicine

## 2018-03-31 DIAGNOSIS — Z79899 Other long term (current) drug therapy: Secondary | ICD-10-CM | POA: Insufficient documentation

## 2018-03-31 DIAGNOSIS — Y92009 Unspecified place in unspecified non-institutional (private) residence as the place of occurrence of the external cause: Secondary | ICD-10-CM | POA: Diagnosis not present

## 2018-03-31 DIAGNOSIS — S20212A Contusion of left front wall of thorax, initial encounter: Secondary | ICD-10-CM | POA: Diagnosis not present

## 2018-03-31 DIAGNOSIS — W08XXXA Fall from other furniture, initial encounter: Secondary | ICD-10-CM | POA: Diagnosis not present

## 2018-03-31 DIAGNOSIS — M25512 Pain in left shoulder: Secondary | ICD-10-CM | POA: Diagnosis not present

## 2018-03-31 DIAGNOSIS — S299XXA Unspecified injury of thorax, initial encounter: Secondary | ICD-10-CM | POA: Diagnosis not present

## 2018-03-31 DIAGNOSIS — Y999 Unspecified external cause status: Secondary | ICD-10-CM | POA: Insufficient documentation

## 2018-03-31 DIAGNOSIS — Y939 Activity, unspecified: Secondary | ICD-10-CM | POA: Diagnosis not present

## 2018-03-31 DIAGNOSIS — Z8673 Personal history of transient ischemic attack (TIA), and cerebral infarction without residual deficits: Secondary | ICD-10-CM | POA: Diagnosis not present

## 2018-03-31 DIAGNOSIS — S4992XA Unspecified injury of left shoulder and upper arm, initial encounter: Secondary | ICD-10-CM | POA: Diagnosis not present

## 2018-03-31 DIAGNOSIS — W19XXXA Unspecified fall, initial encounter: Secondary | ICD-10-CM

## 2018-03-31 DIAGNOSIS — Z853 Personal history of malignant neoplasm of breast: Secondary | ICD-10-CM | POA: Diagnosis not present

## 2018-03-31 DIAGNOSIS — S4990XA Unspecified injury of shoulder and upper arm, unspecified arm, initial encounter: Secondary | ICD-10-CM

## 2018-03-31 MED ORDER — HYDROMORPHONE HCL 2 MG PO TABS
2.0000 mg | ORAL_TABLET | Freq: Once | ORAL | Status: AC
  Administered 2018-03-31: 2 mg via ORAL
  Filled 2018-03-31: qty 1

## 2018-03-31 MED ORDER — HYDROMORPHONE HCL 2 MG PO TABS: 2.0000 mg | ORAL_TABLET | Freq: Four times a day (QID) | ORAL | 0 refills | Status: DC | PRN

## 2018-03-31 NOTE — ED Triage Notes (Signed)
Pt fell off a couch and hit her L side of her back, arm, and shoulder on coffee table.

## 2018-03-31 NOTE — Discharge Instructions (Signed)
Use ice as much as is comfortable for the next 2 days, adding a heating pad for 15 to 20 minutes 3-4 times daily starting on Thursday.  Usual pain medicine as prescribed if needed for pain relief.  Use caution with this medication as it will make you drowsy, do not drive within 4 hours of taking it.  Follow-up with your doctor for recheck if not improving over the next week.  Also as discussed, if you develop shortness of breath, fevers or worsening cough this may be signs of a lung infection which can be a complication of rib cage injury.  This would need further evaluation with a chest x-ray if the symptoms develop.

## 2018-03-31 NOTE — ED Provider Notes (Signed)
[facility] EMERGENCY DEPARTMENT Provider Note   CSN: [id number] Arrival date & time: 03/31/18  1628     History   Chief Complaint Chief Complaint  Patient presents with  . Fall    HPI Caroline Valdez is a 67 y.o. female with past medical history significant for breast cancer with recent reconstructive surgery, GERD, and cva presenting with  Left posterior and left shoulder pain after fall which occurred just prior to arrival.  She was standing on her sofa trying to catch a ladybug when she fell off, hitting her left side against the coffee table.  She denies head injury, loc, confusion and also denies headache, neck pain, breast or abdominal pain and has no lower extremity pain.  She has had no treatment prior to arrival.    The history is provided by the patient.    Past Medical History:  Diagnosis Date  . Blood dyscrasia   . Breast cancer ([place])    left  . Complication of anesthesia    1980S  TUBAL LIG, + HEMM SURGERY PT PASSED OUT AND WENT INTO SHOCK.  NO PROBLEMS SINCE   . 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 Endometriosis   . Fibromyalgia   . GERD (gastroesophageal reflux disease)   . Hypercholesteremia   . Mini stroke ([place]) 09/2012  . Radiculopathy   . Stroke [facility])    MINI STROKE  . Von Willebrand disease ([place]) 09/11/2013  . Von Willebrand's disease [facility])    history of    Patient Active Problem List   Diagnosis Date Noted  . Personal history of breast cancer 12/05/2017  . Osteoporosis 07/14/2014  . Von Willebrand disease ([place]) 09/11/2013  . Hypercholesteremia   . Ductal carcinoma in situ (DCIS) of left breast   . Radiculopathy 11/08/2010  . Fibromyalgia 11/08/2010  . Endometriosis 11/08/2010    Past Surgical History:  Procedure Laterality Date  . ABDOMINAL HYSTERECTOMY    . BACK SURGERY    . BREAST IMPLANT REMOVAL Right 12/05/2017  . BREAST IMPLANT REMOVAL Right 12/05/2017   Procedure: REMOVAL RIGHT  BREAST IMPLANT MATERIAL;  Surgeon:  Crissie Reese, MD;  Location: [place];  Service: Plastics;  Laterality: Right;  . BREAST RECONSTRUCTION    . BREAST RECONSTRUCTION Right 12/05/2017   Procedure: DELAYED BREAST RECONSTRUCTION WITH SILICONE IMPLANT;  Surgeon: Crissie Reese, MD;  Location: [place];  Service: Plastics;  Laterality: Right;  . CARPAL TUNNEL RELEASE     right  . FINGER SURGERY     LEFT THUMB  . HEMORRHOID SURGERY     X2  . LAMINECTOMY    . MASTECTOMY     left  . SHOULDER SURGERY    . SKIN CANCER EXCISION    . TONSILLECTOMY    . TUBAL LIGATION       OB History   None      Home Medications    Prior to Admission medications   Medication Sig Start Date End Date Taking? Authorizing Provider  acetaminophen (TYLENOL) 500 MG tablet Take 1,000 mg by mouth daily as needed for headache.    [provider]  acyclovir (ZOVIRAX) 400 MG tablet Take 1 tablet (400 mg total) by mouth 3 (three) times daily. Patient taking differently: Take 400 mg by mouth 3 (three) times daily as needed (fever blisters).  08/06/17   Susy Frizzle, MD  ALPRAZolam Duanne Moron) 0.5 MG tablet Take 1 tablet (0.5 mg total) by mouth at bedtime as needed. Patient taking  differently: Take 0.5 mg by mouth at bedtime as needed for anxiety or sleep.  10/08/17   Susy Frizzle, MD  calcium carbonate (OS-CAL) 600 MG TABS Take 600 mg by mouth daily.     [provider]  cholecalciferol (VITAMIN D) 1000 UNITS tablet Take 1,000 Units by mouth daily.    [provider]  doxycycline (MONODOX) 100 MG capsule Take 100 mg by mouth 2 (two) times daily. 11/20/17   [provider]  fluticasone (FLONASE) 50 MCG/ACT nasal spray Place 1 spray into both nostrils daily.    [provider]  HYDROmorphone (DILAUDID) 2 MG tablet Take 1 tablet (2 mg total) by mouth every 6 (six) hours as needed for severe pain. 03/31/18   Evalee Jefferson, PA-C  loratadine (CLARITIN) 10 MG tablet Take 10 mg by mouth daily.    [provider]    methocarbamol (ROBAXIN) 500 MG tablet Take 500 mg by mouth 4 (four) times daily as needed for muscle spasms. 11/20/17   [provider]  Multiple Vitamin (MULTIVITAMIN WITH MINERALS) TABS tablet Take 1 tablet by mouth daily.    [provider]  pantoprazole (PROTONIX) 40 MG tablet TAKE 1 TABLET DAILY 11/05/17   Susy Frizzle, MD  ranitidine (ZANTAC) 150 MG tablet Take 1 tablet (150 mg total) by mouth 2 (two) times daily. 07/30/17   Susy Frizzle, MD  rosuvastatin (CRESTOR) 20 MG tablet TAKE 1 TABLET DAILY 09/17/17   Susy Frizzle, MD    Family History Family History  Problem Relation Age of Onset  . Breast cancer Mother 60       now 28yo  . Stroke Father        Father had no full sisters  . Breast cancer Sister 76       now 43yo; had TAH/BSO  . Lung cancer Brother        died 74yo; smoker    Social History Social History   Tobacco Use  . Smoking status: Never Smoker  . Smokeless tobacco: Never Used  Substance Use Topics  . Alcohol use: No  . Drug use: No     Allergies   Aspirin; Hydrocodone; Oxycodone; and Tape   Review of Systems Review of Systems  Constitutional: Negative for chills and fever.  Gastrointestinal: Negative for abdominal pain, nausea and vomiting.  Musculoskeletal: Positive for arthralgias and back pain. Negative for joint swelling, myalgias and neck pain.  Neurological: Negative for dizziness, weakness, numbness and headaches.     Physical Exam Updated Vital Signs BP (!) 152/93 (BP Location: Right Arm)   Pulse 90   Temp 97.9 F (36.6 C) (Temporal)   Resp 16   Ht 5\' 5"  (1.651 m)   Wt 58.1 kg   SpO2 99%   BMI 21.30 kg/m    Physical Exam  Constitutional: She appears well-developed and well-nourished.  HENT:  Head: Atraumatic.  Neck: Normal range of motion.  Cardiovascular:  Pulses equal bilaterally  Pulmonary/Chest: Breath sounds normal. No respiratory distress.  Poor effort secondary to pain.  Abdominal:  Soft. There is no tenderness.  Musculoskeletal: She exhibits tenderness.       Left shoulder: She exhibits bony tenderness. She exhibits no swelling, no effusion, no crepitus and no deformity.       Thoracic back: She exhibits bony tenderness.  Tender to palpation at the lateral left humeral head.  There is no palpable or visible deformity, there is no bruising or swelling noted.  She is  equal grip strength.  Radial pulses are equal and full.  No pain with wrist or elbow flexion or extension.  There is bony tenderness at the left mid posterior rib cage.  There is no palpable deformity, no hematomas, no crepitus.  Neurological: She is alert. She has normal strength. She displays normal reflexes. No sensory deficit.  Skin: Skin is warm and dry.  Psychiatric: She has a normal mood and affect.     ED Treatments / Results  Labs (all labs ordered are listed, but only abnormal results are displayed) Labs Reviewed - No data to display  EKG None  Radiology Dg Ribs Unilateral W/chest Left  Result Date: 03/31/2018 CLINICAL DATA:  Fall EXAM: LEFT RIBS AND CHEST - 3+ VIEW COMPARISON:  None. FINDINGS: Single-view chest demonstrates no acute opacity or pleural effusion. No pneumothorax. Normal heart size. Aortic atherosclerosis. Left rib series demonstrates no acute displaced left rib fracture. IMPRESSION: Negative. Electronically Signed   By: Donavan Foil M.D.   On: 03/31/2018 17:36   Dg Shoulder Left  Result Date: 03/31/2018 CLINICAL DATA:  Fall with shoulder pain EXAM: LEFT SHOULDER - 2+ VIEW COMPARISON:  None. FINDINGS: No fracture or dislocation. Mild AC joint degenerative change. Surgical clips in the left axilla. IMPRESSION: No acute osseous abnormality.  Mild AC joint degenerative change. Electronically Signed   By: Donavan Foil M.D.   On: 03/31/2018 17:26    Procedures Procedures (including critical care time)  Medications Ordered in ED Medications  HYDROmorphone (DILAUDID) tablet 2  mg (2 mg Oral Given 03/31/18 1715)     Initial Impression / Assessment and Plan / ED Course  I have reviewed the triage vital signs and the nursing notes.  Pertinent labs & imaging results that were available during my care of the patient were reviewed by me and considered in my medical decision making (see chart for details).     Patient with a fall but negative x-ray findings.  I suspect she has contusions of her left shoulder and posterior rib cage.  She has a history of von Willebrand's disorder and therefore does not tolerate NSAIDs or salicylates.  Additionally she is allergic to hydrocodone and oxycodone.  She was prescribed Dilaudid 2 mg tablets for her recent breast surgery, states she tolerated this medication well and has a few tablets left.  She was prescribed a few additional tablets for as needed use as she recovers from this injury.  Discussed ice therapy and heat therapy.  Follow-up with her PCP for recheck if symptoms are not improving over the next week.  We discussed strict return precautions.  [place] narcotic database was reviewed prior to discharge home.  Final Clinical Impressions(s) / ED Diagnoses   Final diagnoses:  Rib contusion, left, initial encounter  Fall, initial encounter  Shoulder injury, initial encounter    ED Discharge Orders         Ordered    HYDROmorphone (DILAUDID) 2 MG tablet  Every 6 hours PRN     03/31/18 1825          Evalee Jefferson, PA-C 03/31/18 1913  Daleen Bo, MD 04/01/18 2355

## 2018-04-08 ENCOUNTER — Encounter: Payer: Self-pay | Admitting: Family Medicine

## 2018-04-08 ENCOUNTER — Ambulatory Visit (INDEPENDENT_AMBULATORY_CARE_PROVIDER_SITE_OTHER): Payer: Medicare Other | Admitting: Family Medicine

## 2018-04-08 VITALS — BP 112/68 | HR 100 | Temp 98.1°F | Resp 16 | Ht 65.5 in | Wt 129.0 lb

## 2018-04-08 DIAGNOSIS — S20212A Contusion of left front wall of thorax, initial encounter: Secondary | ICD-10-CM | POA: Diagnosis not present

## 2018-04-08 MED ORDER — TRAMADOL HCL 50 MG PO TABS: 50.0000 mg | ORAL_TABLET | Freq: Four times a day (QID) | ORAL | 0 refills | Status: DC | PRN

## 2018-04-08 NOTE — Progress Notes (Signed)
Subjective:    Patient ID: Caroline Valdez, female    DOB: 1951/01/27, 67 y.o.   MRN: [id number]  HPI  On November 11, the patient was climbing on top of her couch to try to get a ladybug off the ceiling.  She lost her balance and fell backwards landing on the coffee table striking her left ribs, her left gluteus, and the dorsum of her left forearm.  She now has bruising on the dorsal surface of her left forearm.  She also has bruising over her left gluteus.  However her most significant pain is in her posterior left ribs just below her scapula.  She reports pleurisy in that area.  She reports a dull constant ache.  X-rays of the ribs and chest in the emergency room on the 11th were negative for any fracture, pneumothorax, etc.  X-rays of her left shoulder were negative as well.  The pain in her left shoulder has improved.  However she continues to report just a dull constant pain in her posterior left flank.  She denies any hemoptysis.  She denies any shortness of breath.  She denies any fever.  She denies any chest pain Past Medical History:  Diagnosis Date  . Blood dyscrasia   . Breast cancer ([place])    left  . Complication of anesthesia    1980S  TUBAL LIG, + HEMM SURGERY PT PASSED OUT AND WENT INTO SHOCK.  NO PROBLEMS SINCE   . 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 Endometriosis   . Fibromyalgia   . GERD (gastroesophageal reflux disease)   . Hypercholesteremia   . Mini stroke ([place]) 09/2012  . Radiculopathy   . Stroke [facility])    MINI STROKE  . Von Willebrand disease ([place]) 09/11/2013  . Von Willebrand's disease [facility])    history of   Past Surgical History:  Procedure Laterality Date  . ABDOMINAL HYSTERECTOMY    . BACK SURGERY    . BREAST IMPLANT REMOVAL Right 12/05/2017  . BREAST IMPLANT REMOVAL Right 12/05/2017   Procedure: REMOVAL RIGHT  BREAST IMPLANT MATERIAL;  Surgeon: Crissie Reese, MD;  Location: [place];  Service: Plastics;  Laterality: Right;  . BREAST  RECONSTRUCTION    . BREAST RECONSTRUCTION Right 12/05/2017   Procedure: DELAYED BREAST RECONSTRUCTION WITH SILICONE IMPLANT;  Surgeon: Crissie Reese, MD;  Location: [place];  Service: Plastics;  Laterality: Right;  . CARPAL TUNNEL RELEASE     right  . FINGER SURGERY     LEFT THUMB  . HEMORRHOID SURGERY     X2  . LAMINECTOMY    . MASTECTOMY     left  . SHOULDER SURGERY    . SKIN CANCER EXCISION    . TONSILLECTOMY    . TUBAL LIGATION     Current Outpatient Medications on File Prior to Visit  Medication Sig Dispense Refill  . acetaminophen (TYLENOL) 500 MG tablet Take 1,000 mg by mouth daily as needed for headache.    Marland Kitchen acyclovir (ZOVIRAX) 400 MG tablet Take 1 tablet (400 mg total) by mouth 3 (three) times daily. (Patient taking differently: Take 400 mg by mouth 3 (three) times daily as needed (fever blisters). ) 30 tablet 0  . ALPRAZolam (XANAX) 0.5 MG tablet Take 1 tablet (0.5 mg total) by mouth at bedtime as needed. (Patient taking differently: Take 0.5 mg by mouth at bedtime as needed for anxiety or sleep. ) 60 tablet 1  . calcium carbonate (OS-CAL) 600 MG TABS Take  600 mg by mouth daily.     . cholecalciferol (VITAMIN D) 1000 UNITS tablet Take 1,000 Units by mouth daily.    . fluticasone (FLONASE) 50 MCG/ACT nasal spray Place 1 spray into both nostrils daily.    Marland Kitchen HYDROmorphone (DILAUDID) 2 MG tablet Take 1 tablet (2 mg total) by mouth every 6 (six) hours as needed for severe pain. 10 tablet 0  . loratadine (CLARITIN) 10 MG tablet Take 10 mg by mouth daily.    . Multiple Vitamin (MULTIVITAMIN WITH MINERALS) TABS tablet Take 1 tablet by mouth daily.    . pantoprazole (PROTONIX) 40 MG tablet TAKE 1 TABLET DAILY 90 tablet 3  . ranitidine (ZANTAC) 150 MG tablet Take 1 tablet (150 mg total) by mouth 2 (two) times daily. 180 tablet 2  . rosuvastatin (CRESTOR) 20 MG tablet TAKE 1 TABLET DAILY 90 tablet 3   Current Facility-Administered Medications on File Prior to Visit  Medication Dose  Route Frequency Provider Last Rate Last Dose  . denosumab (PROLIA) injection 60 mg  60 mg Subcutaneous Q6 months Susy Frizzle, MD   60 mg at 07/30/17 1033   Allergies  Allergen Reactions  . Aspirin Other (See Comments)    Pt has Von Willebrands Disease  . Hydrocodone Itching  . Oxycodone Itching  . Tape Dermatitis    Pulls off skin   Social History   Socioeconomic History  . Marital status: Widowed    Spouse name: Not on file  . Number of children: Not on file  . Years of education: Not on file  . Highest education level: Not on file  Occupational History  . Not on file  Social Needs  . Financial resource strain: Not on file  . Food insecurity:    Worry: Not on file    Inability: Not on file  . Transportation needs:    Medical: Not on file    Non-medical: Not on file  Tobacco Use  . Smoking status: Never Smoker  . Smokeless tobacco: Never Used  Substance and Sexual Activity  . Alcohol use: No  . Drug use: No  . Sexual activity: Not on file    Comment: husband recently died from myelodysplastic syndrome in January of 2014  Lifestyle  . Physical activity:    Days per week: Not on file    Minutes per session: Not on file  . Stress: Not on file  Relationships  . Social connections:    Talks on phone: Not on file    Gets together: Not on file    Attends religious service: Not on file    Active member of club or organization: Not on file    Attends meetings of clubs or organizations: Not on file    Relationship status: Not on file  . Intimate partner violence:    Fear of current or ex partner: Not on file    Emotionally abused: Not on file    Physically abused: Not on file    Forced sexual activity: Not on file  Other Topics Concern  . Not on file  Social History Narrative  . Not on file     Review of Systems  All other systems reviewed and are negative.      Objective:   Physical Exam  Constitutional: She appears well-developed and well-nourished.   Cardiovascular: Normal rate, regular rhythm and normal heart sounds. Exam reveals no gallop and no friction rub.  No murmur heard. Pulmonary/Chest: Effort normal and breath sounds normal.  No stridor. No respiratory distress. She has no wheezes. She has no rales.     She exhibits tenderness.  Abdominal: Soft. Bowel sounds are normal.  Musculoskeletal: She exhibits no edema.          Assessment & Plan:  Bruised ribs, left, initial encounter  I believe the patient bruised her ribs.  I do not any evidence of a pneumothorax.  She denies any fevers or chills.  I do not suspect pneumonia.  She denies any cough.  Therefore I will treat the patient symptomatically at the present time with tramadol 50 mg every 8 hours as needed for pain.  I recommended that she wear a rib belt and that she perform incentive spirometry to prevent atelectasis and pneumonia.  If symptoms worsen, I would repeat a chest x-ray.

## 2018-04-09 ENCOUNTER — Other Ambulatory Visit: Payer: Self-pay | Admitting: Family Medicine

## 2018-04-18 ENCOUNTER — Other Ambulatory Visit: Payer: Self-pay | Admitting: Family Medicine

## 2018-04-21 NOTE — Telephone Encounter (Signed)
Requesting refill    Tramadol  LOV: 04/08/18  LRF:  04/08/18

## 2018-05-01 ENCOUNTER — Other Ambulatory Visit: Payer: Self-pay | Admitting: Family Medicine

## 2018-05-01 ENCOUNTER — Encounter: Payer: Self-pay | Admitting: Family Medicine

## 2018-05-01 MED ORDER — TRAMADOL HCL 50 MG PO TABS: ORAL_TABLET | ORAL | 0 refills | Status: DC

## 2018-05-16 ENCOUNTER — Other Ambulatory Visit: Payer: Self-pay | Admitting: Family Medicine

## 2018-05-19 ENCOUNTER — Other Ambulatory Visit: Payer: Self-pay | Admitting: Family Medicine

## 2018-05-20 NOTE — Telephone Encounter (Signed)
Ok to refill??  Last office visit 04/08/2018.  Last refill 05/01/2018.

## 2018-07-01 ENCOUNTER — Other Ambulatory Visit (HOSPITAL_COMMUNITY): Payer: Self-pay | Admitting: Family Medicine

## 2018-07-01 DIAGNOSIS — Z1231 Encounter for screening mammogram for malignant neoplasm of breast: Secondary | ICD-10-CM

## 2018-07-24 ENCOUNTER — Ambulatory Visit (INDEPENDENT_AMBULATORY_CARE_PROVIDER_SITE_OTHER): Payer: Medicare Other | Admitting: Family Medicine

## 2018-07-24 ENCOUNTER — Encounter: Payer: Self-pay | Admitting: Family Medicine

## 2018-07-24 VITALS — BP 112/74 | HR 84 | Temp 98.0°F | Resp 18 | Ht 65.5 in | Wt 126.0 lb

## 2018-07-24 DIAGNOSIS — M810 Age-related osteoporosis without current pathological fracture: Secondary | ICD-10-CM

## 2018-07-24 DIAGNOSIS — Z0000 Encounter for general adult medical examination without abnormal findings: Secondary | ICD-10-CM | POA: Diagnosis not present

## 2018-07-24 DIAGNOSIS — E7800 Pure hypercholesterolemia, unspecified: Secondary | ICD-10-CM | POA: Diagnosis not present

## 2018-07-24 DIAGNOSIS — Z1211 Encounter for screening for malignant neoplasm of colon: Secondary | ICD-10-CM

## 2018-07-24 NOTE — Progress Notes (Signed)
Subjective:    Patient ID: Caroline Valdez, female    DOB: 19-Mar-1951, 68 y.o.   MRN: [id number]  HPI Patient is a very pleasant 68 year old white female who has a history of breast cancer in the left breast and also a history of a TIA who presents today for CPE.  Last colonoscopy was in 2010 and is due again in 2020.  She request that I schedule her to see Dr. Gala Romney.  Mammogram is scheduled for later this month.  She has a history of a hysterectomy and therefore does not require Pap smear.  Her bone density test was performed last year and was significant for osteoporosis.  She is currently on Prolia and has a Prolia injection scheduled for later this month or early next month.  Patient is due for fasting lab work.  Otherwise she is doing well with no complications or concerns.  She denies any problems with falls or depression or memory loss Past Medical History:  Diagnosis Date  . Blood dyscrasia   . Breast cancer ([place])    left  . Complication of anesthesia    1980S  TUBAL LIG, + HEMM SURGERY PT PASSED OUT AND WENT INTO SHOCK.  NO PROBLEMS SINCE   . 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 Endometriosis   . Fibromyalgia   . GERD (gastroesophageal reflux disease)   . Hypercholesteremia   . Mini stroke ([place]) 09/2012  . Radiculopathy   . Stroke [facility])    MINI STROKE  . Von Willebrand disease ([place]) 09/11/2013  . Von Willebrand's disease [facility])    history of   Past Surgical History:  Procedure Laterality Date  . ABDOMINAL HYSTERECTOMY    . BACK SURGERY    . BREAST IMPLANT REMOVAL Right 12/05/2017  . BREAST IMPLANT REMOVAL Right 12/05/2017   Procedure: REMOVAL RIGHT  BREAST IMPLANT MATERIAL;  Surgeon: Crissie Reese, MD;  Location: [place];  Service: Plastics;  Laterality: Right;  . BREAST RECONSTRUCTION    . BREAST RECONSTRUCTION Right 12/05/2017   Procedure: DELAYED BREAST RECONSTRUCTION WITH SILICONE IMPLANT;  Surgeon: Crissie Reese, MD;  Location: [place];  Service: Plastics;   Laterality: Right;  . CARPAL TUNNEL RELEASE     right  . FINGER SURGERY     LEFT THUMB  . HEMORRHOID SURGERY     X2  . LAMINECTOMY    . MASTECTOMY     left  . SHOULDER SURGERY    . SKIN CANCER EXCISION    . TONSILLECTOMY    . TUBAL LIGATION     Current Outpatient Medications on File Prior to Visit  Medication Sig Dispense Refill  . acetaminophen (TYLENOL) 500 MG tablet Take 1,000 mg by mouth daily as needed for headache.    Marland Kitchen acyclovir (ZOVIRAX) 400 MG tablet Take 1 tablet (400 mg total) by mouth 3 (three) times daily. (Patient taking differently: Take 400 mg by mouth 3 (three) times daily as needed (fever blisters). ) 30 tablet 0  . ALPRAZolam (XANAX) 0.5 MG tablet Take 1 tablet (0.5 mg total) by mouth at bedtime as needed. (Patient taking differently: Take 0.5 mg by mouth at bedtime as needed for anxiety or sleep. ) 60 tablet 1  . calcium carbonate (OS-CAL) 600 MG TABS Take 600 mg by mouth daily.     . cholecalciferol (VITAMIN D) 1000 UNITS tablet Take 1,000 Units by mouth daily.    . fluticasone (FLONASE) 50 MCG/ACT nasal spray Place 1 spray into both nostrils  daily.    . loratadine (CLARITIN) 10 MG tablet Take 10 mg by mouth daily.    . Multiple Vitamin (MULTIVITAMIN WITH MINERALS) TABS tablet Take 1 tablet by mouth daily.    . pantoprazole (PROTONIX) 40 MG tablet TAKE 1 TABLET DAILY 90 tablet 3  . rosuvastatin (CRESTOR) 20 MG tablet TAKE 1 TABLET DAILY 90 tablet 3  . traMADol (ULTRAM) 50 MG tablet TAKE 1 TABLET(50 MG) BY MOUTH EVERY 6 HOURS AS NEEDED 30 tablet 0   Current Facility-Administered Medications on File Prior to Visit  Medication Dose Route Frequency Provider Last Rate Last Dose  . denosumab (PROLIA) injection 60 mg  60 mg Subcutaneous Q6 months Susy Frizzle, MD   60 mg at 07/30/17 1033   Allergies  Allergen Reactions  . Aspirin Other (See Comments)    Pt has Von Willebrands Disease  . Hydrocodone Itching  . Oxycodone Itching  . Tape Dermatitis    Pulls off  skin   Social History   Socioeconomic History  . Marital status: Widowed    Spouse name: Not on file  . Number of children: Not on file  . Years of education: Not on file  . Highest education level: Not on file  Occupational History  . Not on file  Social Needs  . Financial resource strain: Not on file  . Food insecurity:    Worry: Not on file    Inability: Not on file  . Transportation needs:    Medical: Not on file    Non-medical: Not on file  Tobacco Use  . Smoking status: Never Smoker  . Smokeless tobacco: Never Used  Substance and Sexual Activity  . Alcohol use: No  . Drug use: No  . Sexual activity: Not on file    Comment: husband recently died from myelodysplastic syndrome in January of 2014  Lifestyle  . Physical activity:    Days per week: Not on file    Minutes per session: Not on file  . Stress: Not on file  Relationships  . Social connections:    Talks on phone: Not on file    Gets together: Not on file    Attends religious service: Not on file    Active member of club or organization: Not on file    Attends meetings of clubs or organizations: Not on file    Relationship status: Not on file  . Intimate partner violence:    Fear of current or ex partner: Not on file    Emotionally abused: Not on file    Physically abused: Not on file    Forced sexual activity: Not on file  Other Topics Concern  . Not on file  Social History Narrative  . Not on file   Family History  Problem Relation Age of Onset  . Breast cancer Mother 29       now 30yo  . Stroke Father        Father had no full sisters  . Breast cancer Sister 56       now 95yo; had TAH/BSO  . Lung cancer Brother        died 70yo; smoker   Immunization History  Administered Date(s) Administered  . Influenza Split 01/19/2014  . Influenza Whole 03/07/2009, 03/06/2010  . Influenza,inj,Quad PF,6+ Mos 01/26/2013, 02/17/2015, 01/28/2017, 02/06/2018  . Influenza-Unspecified 03/09/2016  .  Pneumococcal Conjugate-13 10/22/2014  . Pneumococcal Polysaccharide-23 09/07/2010, 03/23/2016  . Td 12/25/2007  . Tdap 12/25/2007  . Zoster 03/30/2010  .  Zoster Recombinat (Shingrix) 07/23/2017, 10/22/2017   Mammogram is up-to-date. Colonoscopy is up-to-date.  Does not require a Pap smear given the fact she's had a hysterectomy.    Review of Systems  All other systems reviewed and are negative.      Objective:   Physical Exam  Constitutional: She is oriented to person, place, and time. She appears well-developed and well-nourished. No distress.  HENT:  Head: Normocephalic and atraumatic.  Right Ear: External ear normal.  Left Ear: External ear normal.  Nose: Nose normal.  Mouth/Throat: Oropharynx is clear and moist. No oropharyngeal exudate.  Eyes: Pupils are equal, round, and reactive to light. Conjunctivae and EOM are normal. Right eye exhibits no discharge. Left eye exhibits no discharge. No scleral icterus.  Neck: Normal range of motion. Neck supple. No JVD present. No tracheal deviation present. No thyromegaly present.  Cardiovascular: Normal rate, regular rhythm, normal heart sounds and intact distal pulses. Exam reveals no gallop and no friction rub.  No murmur heard. Pulmonary/Chest: Effort normal and breath sounds normal. No stridor. No respiratory distress. She has no wheezes. She has no rales. She exhibits no tenderness.  Abdominal: Soft. Bowel sounds are normal. She exhibits no distension. There is no abdominal tenderness. There is no rebound and no guarding.  Musculoskeletal: Normal range of motion.        General: No tenderness or edema.  Lymphadenopathy:    She has no cervical adenopathy.  Neurological: She is alert and oriented to person, place, and time. She displays normal reflexes. No cranial nerve deficit. She exhibits normal muscle tone. Coordination normal.  Skin: Skin is warm. No rash noted. She is not diaphoretic. No erythema. No pallor.  Psychiatric: She  has a normal mood and affect. Her behavior is normal. Judgment and thought content normal.  Vitals reviewed.         Assessment & Plan:  Pure hypercholesterolemia - Plan: CBC with Differential/Platelet, COMPLETE METABOLIC PANEL WITH GFR, Lipid panel  Routine general medical examination at a health care facility  Osteoporosis, unspecified osteoporosis type, unspecified pathological fracture presence  Colon cancer screening  Physical exam today is completely normal.  Blood pressure is excellent.  Patient's osteoporosis is currently being treated with Prolia injections.  She will be due for a bone density next year.  She does not require Pap smears due to her history of hysterectomy.  I will schedule the patient for colonoscopy with her gastroenterologist as it has been 10 years per her report.  Her mammogram is already scheduled for next month.  I will check a CBC, CMP, and fasting lipid panel.

## 2018-07-25 LAB — CBC WITH DIFFERENTIAL/PLATELET
ABSOLUTE MONOCYTES: 571 {cells}/uL (ref 200–950)
BASOS ABS: 31 {cells}/uL (ref 0–200)
BASOS PCT: 0.6 %
Eosinophils Absolute: 61 cells/uL (ref 15–500)
Eosinophils Relative: 1.2 %
HEMATOCRIT: 42.8 % (ref 35.0–45.0)
HEMOGLOBIN: 14.1 g/dL (ref 11.7–15.5)
LYMPHS ABS: 1545 {cells}/uL (ref 850–3900)
MCH: 30.9 pg (ref 27.0–33.0)
MCHC: 32.9 g/dL (ref 32.0–36.0)
MCV: 93.7 fL (ref 80.0–100.0)
MPV: 10.7 fL (ref 7.5–12.5)
Monocytes Relative: 11.2 %
NEUTROS ABS: 2892 {cells}/uL (ref 1500–7800)
Neutrophils Relative %: 56.7 %
Platelets: 199 10*3/uL (ref 140–400)
RBC: 4.57 10*6/uL (ref 3.80–5.10)
RDW: 12.5 % (ref 11.0–15.0)
Total Lymphocyte: 30.3 %
WBC: 5.1 10*3/uL (ref 3.8–10.8)

## 2018-07-25 LAB — LIPID PANEL
CHOL/HDL RATIO: 2.5 (calc) (ref <5.0)
Cholesterol: 157 mg/dL (ref <200)
HDL: 63 mg/dL (ref >=50)
LDL Cholesterol (Calc): 74 mg/dL (calc)
NON-HDL CHOLESTEROL (CALC): 94 mg/dL (ref <130)
TRIGLYCERIDES: 110 mg/dL (ref <150)

## 2018-07-25 LAB — COMPLETE METABOLIC PANEL WITH GFR
AG RATIO: 2.1 (calc) (ref 1.0–2.5)
ALT: 15 U/L (ref 6–29)
AST: 22 U/L (ref 10–35)
Albumin: 4.5 g/dL (ref 3.6–5.1)
Alkaline phosphatase (APISO): 46 U/L (ref 37–153)
BUN: 15 mg/dL (ref 7–25)
CALCIUM: 9 mg/dL (ref 8.6–10.4)
CO2: 28 mmol/L (ref 20–32)
Chloride: 105 mmol/L (ref 98–110)
Creat: 0.79 mg/dL (ref 0.50–0.99)
GFR, EST AFRICAN AMERICAN: 89 mL/min/{1.73_m2} (ref >=60)
GFR, EST NON AFRICAN AMERICAN: 76 mL/min/{1.73_m2} (ref >=60)
GLUCOSE: 86 mg/dL (ref 65–99)
Globulin: 2.1 g/dL (calc) (ref 1.9–3.7)
Potassium: 4.3 mmol/L (ref 3.5–5.3)
Sodium: 142 mmol/L (ref 135–146)
TOTAL PROTEIN: 6.6 g/dL (ref 6.1–8.1)
Total Bilirubin: 0.7 mg/dL (ref 0.2–1.2)

## 2018-07-28 ENCOUNTER — Ambulatory Visit (HOSPITAL_COMMUNITY): Payer: Medicare Other

## 2018-07-31 ENCOUNTER — Encounter: Payer: Self-pay | Admitting: Internal Medicine

## 2018-08-01 ENCOUNTER — Ambulatory Visit (HOSPITAL_COMMUNITY)
Admission: RE | Admit: 2018-08-01 | Discharge: 2018-08-01 | Disposition: A | Discharge status: Home / Self Care | Payer: Medicare Other | Source: Ambulatory Visit | Attending: Family Medicine | Admitting: Family Medicine

## 2018-08-01 ENCOUNTER — Other Ambulatory Visit: Payer: Self-pay

## 2018-08-01 DIAGNOSIS — Z1231 Encounter for screening mammogram for malignant neoplasm of breast: Secondary | ICD-10-CM | POA: Insufficient documentation

## 2018-08-04 ENCOUNTER — Other Ambulatory Visit (HOSPITAL_COMMUNITY): Payer: Self-pay | Admitting: Family Medicine

## 2018-08-04 DIAGNOSIS — R928 Other abnormal and inconclusive findings on diagnostic imaging of breast: Secondary | ICD-10-CM

## 2018-08-05 ENCOUNTER — Other Ambulatory Visit (HOSPITAL_COMMUNITY): Payer: Self-pay | Admitting: Family Medicine

## 2018-08-05 ENCOUNTER — Ambulatory Visit (HOSPITAL_COMMUNITY)
Admission: RE | Admit: 2018-08-05 | Discharge: 2018-08-05 | Disposition: A | Discharge status: Home / Self Care | Payer: Medicare Other | Source: Ambulatory Visit | Attending: Family Medicine | Admitting: Family Medicine

## 2018-08-05 ENCOUNTER — Other Ambulatory Visit: Payer: Self-pay

## 2018-08-05 ENCOUNTER — Ambulatory Visit (HOSPITAL_COMMUNITY): Payer: Medicare Other

## 2018-08-05 DIAGNOSIS — R928 Other abnormal and inconclusive findings on diagnostic imaging of breast: Secondary | ICD-10-CM

## 2018-08-05 DIAGNOSIS — R922 Inconclusive mammogram: Secondary | ICD-10-CM | POA: Diagnosis not present

## 2018-08-08 ENCOUNTER — Ambulatory Visit: Payer: Medicare Other

## 2018-08-11 ENCOUNTER — Other Ambulatory Visit: Payer: Self-pay

## 2018-08-11 ENCOUNTER — Encounter: Payer: Self-pay | Admitting: Family Medicine

## 2018-08-11 ENCOUNTER — Ambulatory Visit (INDEPENDENT_AMBULATORY_CARE_PROVIDER_SITE_OTHER): Payer: Medicare Other

## 2018-08-11 DIAGNOSIS — M810 Age-related osteoporosis without current pathological fracture: Secondary | ICD-10-CM

## 2018-08-11 MED ORDER — PANTOPRAZOLE SODIUM 40 MG PO TBEC: 40.0000 mg | DELAYED_RELEASE_TABLET | Freq: Two times a day (BID) | ORAL | 3 refills | Status: DC

## 2018-08-11 NOTE — Progress Notes (Signed)
Patient came in today to receive 6 month prolia injection. Injection was given in the right arm. Patient tolerated well.

## 2018-08-26 DIAGNOSIS — D1801 Hemangioma of skin and subcutaneous tissue: Secondary | ICD-10-CM | POA: Diagnosis not present

## 2018-08-26 DIAGNOSIS — Z85828 Personal history of other malignant neoplasm of skin: Secondary | ICD-10-CM | POA: Diagnosis not present

## 2018-08-26 DIAGNOSIS — L821 Other seborrheic keratosis: Secondary | ICD-10-CM | POA: Diagnosis not present

## 2018-08-26 DIAGNOSIS — L570 Actinic keratosis: Secondary | ICD-10-CM | POA: Diagnosis not present

## 2018-09-12 ENCOUNTER — Other Ambulatory Visit: Payer: Self-pay | Admitting: Family Medicine

## 2018-09-18 ENCOUNTER — Other Ambulatory Visit: Payer: Self-pay | Admitting: Family Medicine

## 2018-09-18 DIAGNOSIS — Z0000 Encounter for general adult medical examination without abnormal findings: Secondary | ICD-10-CM

## 2018-09-18 DIAGNOSIS — F4321 Adjustment disorder with depressed mood: Secondary | ICD-10-CM

## 2018-09-18 NOTE — Telephone Encounter (Signed)
Ok to refill??  Last office visit 07/24/2018.  Last refill 10/08/2017, #1 refills.

## 2018-10-15 ENCOUNTER — Ambulatory Visit (INDEPENDENT_AMBULATORY_CARE_PROVIDER_SITE_OTHER): Payer: Medicare Other | Admitting: Gastroenterology

## 2018-10-15 ENCOUNTER — Encounter: Payer: Self-pay | Admitting: Gastroenterology

## 2018-10-15 ENCOUNTER — Other Ambulatory Visit: Payer: Self-pay

## 2018-10-15 DIAGNOSIS — D6800 Von Willebrand disease, unspecified: Secondary | ICD-10-CM

## 2018-10-15 DIAGNOSIS — K219 Gastro-esophageal reflux disease without esophagitis: Secondary | ICD-10-CM | POA: Diagnosis not present

## 2018-10-15 DIAGNOSIS — Z1211 Encounter for screening for malignant neoplasm of colon: Secondary | ICD-10-CM | POA: Diagnosis not present

## 2018-10-15 MED ORDER — CIMETIDINE 400 MG PO TABS: 400.0000 mg | ORAL_TABLET | Freq: Two times a day (BID) | ORAL | 5 refills | Status: DC | PRN

## 2018-10-15 NOTE — Progress Notes (Signed)
Primary Care Physician:  Susy Frizzle, MD Primary GI:  Dr. Gala Romney   Patient Location: Home  Provider Location: [facility] office  Reason for Visit: screening colonoscopy  Persons present on the virtual encounter, with roles: Patient, myself (provider),Angela Stallings CMA (updated meds and allergies)  Total time (minutes) spent on medical discussion: 20 minutes  Due to COVID-19, visit was conducted using [email or web address] method.  Visit was requested by patient.  Virtual Visit via [email or web address]  I connected with Caroline Valdez on 10/15/18 at  2:30 PM EDT by [email or web address] and verified that I am speaking with the correct person using two identifiers.   I discussed the limitations, risks, security and privacy concerns of performing an evaluation and management service by telephone/video and the availability of in person appointments. I also discussed with the patient that there may be a patient responsible charge related to this service. The patient expressed understanding and agreed to proceed.   HPI:   Caroline Valdez is a 68 y.o. female who presents for virtual visit regarding due for screening colonoscopy.  Patient's last colonoscopy was over 9 years ago by Dr. Arnoldo Morale, normal.  She denies any specific bowel concerns.  BMs are regular.  No melena.  Occasionally has hemorrhoid issues which she tries to keep under control.  If they flare she may have some bright red blood per rectum.  She has had hemorrhoid surgeries twice in the past.  No recent issues with her hemorrhoids.  Denies any abdominal pain.  She has had some flare in her reflux.  In the past has been on pantoprazole in the morning, Zantac at night but after the recall she had to change her medication.  She was not able to control her symptoms with just once daily pantoprazole also back in March her PCP increased to twice daily.  Continues to have little heartburn every day.  No vomiting.  No dysphagia.  She was given Ultram back in  November when she has suffered a fall.  Suspected fractured rib.  Rarely takes medication.  Recently started on Xanax taking half at bedtime to help with sleep since her mother passed in April she has taken it more regularly.  Diagnosed with von Willebrand's disease remotely through [facility].  Has had at least 3 episodes of hemorrhage, once occurring over a week after tonsillectomy when she was a teenager.  She required blood transfusion and hospitalization.  Describes hemorrhage after 1 of her childbirths.  Potentially after hysterectomy as well.  Received the DDAVP prior to surgery with good results.  She underwent breast reconstruction for ruptured implant in 2019, prior to this she did see hematologist Dr. Lindi Adie at request of her surgeon. Repeat work up with no evidence of von Willebrand factor deficiency without any clear evidence of von Willebrand's disease although given her prior history of hemorrhage etc. she did receive DDAVP 20 mcg IV given 30 minutes.  She received it 30 minutes prior to surgery.    Remote EGD around 2000 was normal.  Possible additional EGD in 2012 but cannot locate report.   Current Outpatient Medications  Medication Sig Dispense Refill   acetaminophen (TYLENOL) 500 MG tablet Take 1,000 mg by mouth daily as needed for headache.     acyclovir (ZOVIRAX) 400 MG tablet Take 1 tablet (400 mg total) by mouth 3 (three) times daily. (Patient taking differently: Take 400 mg by mouth 3 (three) times daily as needed (fever blisters). ) 30 tablet 0  ALPRAZolam (XANAX) 0.5 MG tablet TAKE 1 TABLET(0.5 MG) BY MOUTH AT BEDTIME AS NEEDED 60 tablet 0   calcium carbonate (OS-CAL) 600 MG TABS Take 600 mg by mouth daily.      cholecalciferol (VITAMIN D) 1000 UNITS tablet Take 1,000 Units by mouth daily.     fluticasone (FLONASE) 50 MCG/ACT nasal spray Place 1 spray into both nostrils daily.     loratadine (CLARITIN) 10 MG tablet Take 10 mg by mouth daily.     Multiple Vitamin  (MULTIVITAMIN WITH MINERALS) TABS tablet Take 1 tablet by mouth daily.     pantoprazole (PROTONIX) 40 MG tablet Take 1 tablet (40 mg total) by mouth 2 (two) times daily. 180 tablet 3   rosuvastatin (CRESTOR) 20 MG tablet TAKE 1 TABLET DAILY 90 tablet 3   traMADol (ULTRAM) 50 MG tablet TAKE 1 TABLET(50 MG) BY MOUTH EVERY 6 HOURS AS NEEDED 30 tablet 0   Current Facility-Administered Medications  Medication Dose Route Frequency Provider Last Rate Last Dose   denosumab (PROLIA) injection 60 mg  60 mg Subcutaneous Q6 months Susy Frizzle, MD   60 mg at 08/11/18 1009    Past Medical History:  Diagnosis Date   Blood dyscrasia    Breast cancer ([place])    left   Complication of anesthesia    1980S  TUBAL LIG, + HEMM SURGERY PT PASSED OUT AND WENT INTO SHOCK.  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    Endometriosis    Fibromyalgia    GERD (gastroesophageal reflux disease)    Hypercholesteremia    Mini stroke ([place]) 09/2012   Radiculopathy    Stroke ([place])    MINI STROKE   Von Willebrand disease ([place]) 09/11/2013   Von Willebrand's disease [facility])    history of    Past Surgical History:  Procedure Laterality Date   ABDOMINAL HYSTERECTOMY     BACK SURGERY     BREAST IMPLANT REMOVAL Right 12/05/2017   BREAST IMPLANT REMOVAL Right 12/05/2017   Procedure: REMOVAL RIGHT  BREAST IMPLANT MATERIAL;  Surgeon: Crissie Reese, MD;  Location: [place];  Service: Plastics;  Laterality: Right;   BREAST RECONSTRUCTION     BREAST RECONSTRUCTION Right 12/05/2017   Procedure: DELAYED BREAST RECONSTRUCTION WITH SILICONE IMPLANT;  Surgeon: Crissie Reese, MD;  Location: [place];  Service: Plastics;  Laterality: Right;   CARPAL TUNNEL RELEASE     right   COLONOSCOPY  01/2010   Dr. Aviva Signs: normal   FINGER SURGERY     LEFT THUMB   HEMORRHOID SURGERY     X2   LAMINECTOMY     MASTECTOMY     left   SHOULDER SURGERY     SKIN CANCER EXCISION      TONSILLECTOMY     TUBAL LIGATION      Family History  Problem Relation Age of Onset   Breast cancer Mother 54       now 65yo   Stroke Father        Father had no full sisters   Breast cancer Sister 46       now 57yo; had TAH/BSO   Lung cancer Brother        died 66yo; smoker    Social History   Socioeconomic History   Marital status: Widowed    Spouse name: Not on file   Number of children: Not on file   Years of education: Not on file   Highest  education level: Not on file  Occupational History   Not on file  Social Needs   Financial resource strain: Not on file   Food insecurity:    Worry: Not on file    Inability: Not on file   Transportation needs:    Medical: Not on file    Non-medical: Not on file  Tobacco Use   Smoking status: Never Smoker   Smokeless tobacco: Never Used  Substance and Sexual Activity   Alcohol use: No   Drug use: No   Sexual activity: Not on file    Comment: husband recently died from myelodysplastic syndrome in January of 2014  Lifestyle   Physical activity:    Days per week: Not on file    Minutes per session: Not on file   Stress: Not on file  Relationships   Social connections:    Talks on phone: Not on file    Gets together: Not on file    Attends religious service: Not on file    Active member of club or organization: Not on file    Attends meetings of clubs or organizations: Not on file    Relationship status: Not on file   Intimate partner violence:    Fear of current or ex partner: Not on file    Emotionally abused: Not on file    Physically abused: Not on file    Forced sexual activity: Not on file  Other Topics Concern   Not on file  Social History Narrative   Not on file      ROS:  General: Negative for anorexia, weight loss, fever, chills, fatigue, weakness. Eyes: Negative for vision changes.  ENT: Negative for hoarseness, difficulty swallowing , nasal congestion. CV: Negative for  chest pain, angina, palpitations, dyspnea on exertion, peripheral edema.  Respiratory: Negative for dyspnea at rest, dyspnea on exertion, cough, sputum, wheezing.  GI: See history of present illness. GU:  Negative for dysuria, hematuria, urinary incontinence, urinary frequency, nocturnal urination.  MS: Negative for joint pain, low back pain.  Derm: Negative for rash or itching.  Neuro: Negative for weakness, abnormal sensation, seizure, frequent headaches, memory loss, confusion.  Psych: Negative for anxiety, depression, suicidal ideation, hallucinations.  Endo: Negative for unusual weight change.  Heme: Negative for bruising or bleeding. Allergy: Negative for rash or hives.   Observations/Objective: Pleasant well-nourished well-developed Caucasian female in no acute distress.  Lab Results  Component Value Date   CREATININE 0.79 07/24/2018   BUN 15 07/24/2018   NA 142 07/24/2018   K 4.3 07/24/2018   CL 105 07/24/2018   CO2 28 07/24/2018   Lab Results  Component Value Date   ALT 15 07/24/2018   AST 22 07/24/2018   ALKPHOS 54 01/15/2017   BILITOT 0.7 07/24/2018   Lab Results  Component Value Date   WBC 5.1 07/24/2018   HGB 14.1 07/24/2018   HCT 42.8 07/24/2018   MCV 93.7 07/24/2018   PLT 199 07/24/2018   Lab Results  Component Value Date   WBC 5.1 07/24/2018   HGB 14.1 07/24/2018   HCT 42.8 07/24/2018   MCV 93.7 07/24/2018   PLT 199 07/24/2018     Assessment and Plan: Pleasant 68 year old female presenting to schedule screening colonoscopy.  No lower GI concerns.  Recently has had flare of reflux symptoms after coming off Zantac.  Has been on pantoprazole once daily along with Zantac at night for years.  With recent recall this was switched to pantoprazole only in  the morning.  Subsequently has required twice daily dosing but continues to have some breakthrough symptoms.  Will add cimetidine 400 mg twice daily as needed only for breakthrough symptoms.  She will call  if symptoms do not settle down.  Previous diagnosis of von Willebrand's disease with history of hemorrhage postoperatively as outlined. Work-up in 2019 without any clear evidence of von Willebrand's disease although still recommended to receive DDAVP prior to her breast reconstruction surgery in 2019.  I will reach out with her long-term hematologist at [place].  States she saw Dr. Lindi Adie (hematologist) only because her breast surgeon was in [place] and he wanted someone available at the same facility as the surgery.  We will determine if patient will require DDAVP prior to colonoscopy.  Once this is been decided, we will schedule colonoscopy accordingly.  I have discussed the risks, alternatives, benefits with regards to but not limited to the risk of reaction to medication, bleeding, infection, perforation and the patient is agreeable to proceed. Written consent to be obtained.   Follow Up Instructions:    I discussed the assessment and treatment plan with the patient. The patient was provided an opportunity to ask questions and all were answered. The patient agreed with the plan and demonstrated an understanding of the instructions. AVS mailed to patient's home address.   The patient was advised to call back or seek an in-person evaluation if the symptoms worsen or if the condition fails to improve as anticipated.  I provided 20 minutes of virtual face-to-face time during this encounter.   Neil Crouch, PA-C

## 2018-10-15 NOTE — Patient Instructions (Addendum)
1. We will be in touch to schedule colonoscopy in the near future once I have coordinated your care with hematology regarding your von Willebrand's disease. 2. For acid reflux, you can continue pantoprazole 40 mg twice daily, 30 minutes before breakfast and 30 minutes before evening meal. I have sent RX for cimetidine to take 400mg  twice daily as needed for breakthrough heartburn symptoms. Hopefully you will only require this short term. If symptoms do not improve, please let me know.

## 2018-10-16 NOTE — Progress Notes (Signed)
CC'D TO PCP °

## 2018-10-27 ENCOUNTER — Telehealth: Payer: Self-pay | Admitting: Internal Medicine

## 2018-10-27 DIAGNOSIS — D6800 Von Willebrand disease, unspecified: Secondary | ICD-10-CM

## 2018-10-27 NOTE — Telephone Encounter (Signed)
Patient aware LSL is not in this week. Will forward message to LSL

## 2018-10-27 NOTE — Telephone Encounter (Signed)
Pt said she had a virtual visit with LSL on 5/27 and LSL was to get with her hematologist prior to scheduling her colonoscopy. She was calling to follow up on that because she hasn't heard anything from [place]. Please call [phone]

## 2018-11-03 NOTE — Telephone Encounter (Signed)
Please let patient know that I sent her hematologist a message prior to me being off last week but haven't heard anything. I will touch base again this week.

## 2018-11-04 NOTE — Telephone Encounter (Signed)
Please let patient know that Dr. Delton Coombes (hematology at [facility] wants to evaluate her prior to [place] performing colonoscopy. Please make referral for Von Willebrand disease, h/o postoperative hemorrhage.   Have patient call us once she has completed referral and we will then schedule her colonoscopy.

## 2018-11-04 NOTE — Addendum Note (Signed)
Addended by: Inge Rise on: 11/04/2018 03:45 PM   Modules accepted: Orders

## 2018-11-04 NOTE — Telephone Encounter (Signed)
Patient is aware of below. Referral placed.

## 2018-11-04 NOTE — Telephone Encounter (Signed)
Patient aware.

## 2018-11-11 ENCOUNTER — Other Ambulatory Visit (HOSPITAL_COMMUNITY): Payer: Self-pay | Admitting: Emergency Medicine

## 2018-11-11 DIAGNOSIS — D0512 Intraductal carcinoma in situ of left breast: Secondary | ICD-10-CM

## 2018-11-12 ENCOUNTER — Ambulatory Visit (HOSPITAL_COMMUNITY): Payer: Medicare Other | Admitting: Hematology

## 2018-11-12 ENCOUNTER — Other Ambulatory Visit (HOSPITAL_COMMUNITY): Payer: Medicare Other

## 2018-11-12 ENCOUNTER — Inpatient Hospital Stay (HOSPITAL_COMMUNITY): Payer: Medicare Other | Attending: Hematology | Admitting: Hematology

## 2018-11-12 ENCOUNTER — Other Ambulatory Visit: Payer: Self-pay

## 2018-11-12 ENCOUNTER — Encounter (HOSPITAL_COMMUNITY): Payer: Self-pay | Admitting: Hematology

## 2018-11-12 ENCOUNTER — Inpatient Hospital Stay (HOSPITAL_COMMUNITY): Payer: Medicare Other

## 2018-11-12 VITALS — BP 120/64 | HR 86 | Temp 98.0°F | Resp 18 | Wt 129.1 lb

## 2018-11-12 DIAGNOSIS — D0512 Intraductal carcinoma in situ of left breast: Secondary | ICD-10-CM

## 2018-11-12 DIAGNOSIS — D6800 Von Willebrand disease, unspecified: Secondary | ICD-10-CM

## 2018-11-12 DIAGNOSIS — Z79899 Other long term (current) drug therapy: Secondary | ICD-10-CM | POA: Diagnosis not present

## 2018-11-12 DIAGNOSIS — Z853 Personal history of malignant neoplasm of breast: Secondary | ICD-10-CM | POA: Diagnosis not present

## 2018-11-12 DIAGNOSIS — Z8673 Personal history of transient ischemic attack (TIA), and cerebral infarction without residual deficits: Secondary | ICD-10-CM | POA: Diagnosis not present

## 2018-11-12 DIAGNOSIS — E7800 Pure hypercholesterolemia, unspecified: Secondary | ICD-10-CM | POA: Diagnosis not present

## 2018-11-12 DIAGNOSIS — M797 Fibromyalgia: Secondary | ICD-10-CM | POA: Insufficient documentation

## 2018-11-12 DIAGNOSIS — K219 Gastro-esophageal reflux disease without esophagitis: Secondary | ICD-10-CM | POA: Diagnosis not present

## 2018-11-12 LAB — CBC WITH DIFFERENTIAL/PLATELET
Abs Immature Granulocytes: 0.01 10*3/uL (ref 0.00–0.07)
Basophils Absolute: 0 10*3/uL (ref 0.0–0.1)
Basophils Relative: 1 %
Eosinophils Absolute: 0.1 10*3/uL (ref 0.0–0.5)
Eosinophils Relative: 2 %
HCT: 41.1 % (ref 36.0–46.0)
Hemoglobin: 13.8 g/dL (ref 12.0–15.0)
Immature Granulocytes: 0 %
Lymphocytes Relative: 26 %
Lymphs Abs: 1.6 10*3/uL (ref 0.7–4.0)
MCH: 31.9 pg (ref 26.0–34.0)
MCHC: 33.6 g/dL (ref 30.0–36.0)
MCV: 94.9 fL (ref 80.0–100.0)
Monocytes Absolute: 0.6 10*3/uL (ref 0.1–1.0)
Monocytes Relative: 10 %
Neutro Abs: 3.8 10*3/uL (ref 1.7–7.7)
Neutrophils Relative %: 61 %
Platelets: 199 10*3/uL (ref 150–400)
RBC: 4.33 MIL/uL (ref 3.87–5.11)
RDW: 12.2 % (ref 11.5–15.5)
WBC: 6.1 10*3/uL (ref 4.0–10.5)
nRBC: 0 % (ref 0.0–0.2)

## 2018-11-12 LAB — COMPREHENSIVE METABOLIC PANEL
ALT: 18 U/L (ref 0–44)
AST: 24 U/L (ref 15–41)
Albumin: 4.2 g/dL (ref 3.5–5.0)
Alkaline Phosphatase: 41 U/L (ref 38–126)
Anion gap: 10 (ref 5–15)
BUN: 15 mg/dL (ref 8–23)
CO2: 27 mmol/L (ref 22–32)
Calcium: 8.9 mg/dL (ref 8.9–10.3)
Chloride: 103 mmol/L (ref 98–111)
Creatinine, Ser: 0.79 mg/dL (ref 0.44–1.00)
GFR calc Af Amer: >60 mL/min (ref >60)
GFR calc non Af Amer: >60 mL/min (ref >60)
Glucose, Bld: 113 mg/dL — ABNORMAL HIGH (ref 70–99)
Potassium: 3.8 mmol/L (ref 3.5–5.1)
Sodium: 140 mmol/L (ref 135–145)
Total Bilirubin: 0.6 mg/dL (ref 0.3–1.2)
Total Protein: 7 g/dL (ref 6.5–8.1)

## 2018-11-12 NOTE — Assessment & Plan Note (Signed)
1.  Clinical von Willebrand's disease: - Patient sent to is by GI prior to colonoscopy and possible biopsy. - She carries a diagnosis of von Willebrand's disease.  She was reportedly seen by Dr. [name], at [facility]. - History positive for major hemorrhage after tonsillectomy at age of 76 requiring blood transfusion.  She also had hemorrhage after normal vaginal delivery 12 days after second childbirth. As she had to have packing done after every time she had tooth pulled.  She had major bleeding after hysterectomy 1996. - She reported having received DDAVP prior to most surgical procedures after that which went on without any problems.  Last procedure was right breast implant replacement last year. - She reports that she always had easy bruising on the legs all her life.  Denies any nosebleeds.  No joint bleeds or muscular bleeds reported.  Does not report any family history of von Willebrand's disease or other bleeding diathesis. - I have reviewed labs from 2019 which showed normal von Willebrand antigen, factor VIII and the ristocetin cofactor. - I have recommended repeating labs including von Willebrand's profile, PT, PTT and fibrinogen. - Factor IX, factor VII activity, factor X activity, 11 and 12 were all normal when tested in May 2019.  I would not want to repeat this test. - We will schedule for a telephone visit for follow-up of these labs. -We will schedule her for DDAVP by contacting GI.

## 2018-11-12 NOTE — Progress Notes (Signed)
CONSULT NOTE  Patient Care Team: Susy Frizzle, MD as PCP - General (Family Medicine) Crissie Reese, MD as Consulting Physician (Plastic Surgery) Gala Romney Cristopher Estimable, MD as Consulting Physician (Gastroenterology)  CHIEF COMPLAINTS/PURPOSE OF CONSULTATION:  Von Willebrand's disease.  HISTORY OF PRESENTING ILLNESS:  Caroline Valdez 68 y.o. female is seen in consultation today for further work-up and management of von Willebrand's disease.  She is supposed to have a colonoscopy and possible biopsy.  Hence she was referred to [place] by gastroenterology.  She reports that she always had easy bruising over the lower extremities.  Denies any nosebleeds.  She had a hemorrhage after tonsillectomy at age 47 and had to receive blood transfusion.  She also had a major hemorrhage 12 days after her second childbirth vaginally.  She had to have packing done after tooth pulled every time.  She had a major bleeding after hysterectomy 1996.  She also had breast implant replaced last year but received DDAVP and did not have any problems with bleeding after that.  She reported that she was seen by Dr. Gaylyn Cheers at [facility].  She was reportedly recommended to have DDAVP prior to procedures at that time.  I could not find any of those notes on care everywhere.  She denied any joint bleeds or muscular bleeding.  Denies any fevers, night sweats or weight loss.  Appetite is 75% and energy levels are 100%.   MEDICAL HISTORY:  Past Medical History:  Diagnosis Date  . Blood dyscrasia   . Breast cancer ([place])    left  . Complication of anesthesia    1980S  TUBAL LIG, + HEMM SURGERY PT PASSED OUT AND WENT INTO SHOCK.  NO PROBLEMS SINCE   . 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 Endometriosis   . Fibromyalgia   . GERD (gastroesophageal reflux disease)   . Hypercholesteremia   . Mini stroke ([place]) 09/2012  . Radiculopathy   . Stroke [facility])    MINI STROKE  . Von Willebrand disease ([place]) 09/11/2013  . Von  Willebrand's disease [facility])    history of    SURGICAL HISTORY: Past Surgical History:  Procedure Laterality Date  . ABDOMINAL HYSTERECTOMY    . BACK SURGERY    . BREAST IMPLANT REMOVAL Right 12/05/2017  . BREAST IMPLANT REMOVAL Right 12/05/2017   Procedure: REMOVAL RIGHT  BREAST IMPLANT MATERIAL;  Surgeon: Crissie Reese, MD;  Location: [place];  Service: Plastics;  Laterality: Right;  . BREAST RECONSTRUCTION    . BREAST RECONSTRUCTION Right 12/05/2017   Procedure: DELAYED BREAST RECONSTRUCTION WITH SILICONE IMPLANT;  Surgeon: Crissie Reese, MD;  Location: [place];  Service: Plastics;  Laterality: Right;  . CARPAL TUNNEL RELEASE     right  . COLONOSCOPY  01/2010   Dr. Aviva Signs: normal  . FINGER SURGERY     LEFT THUMB  . HEMORRHOID SURGERY     X2  . LAMINECTOMY    . MASTECTOMY     left  . SHOULDER SURGERY    . SKIN CANCER EXCISION    . TONSILLECTOMY    . TUBAL LIGATION      SOCIAL HISTORY: Social History   Socioeconomic History  . Marital status: Widowed    Spouse name: Not on file  . Number of children: Not on file  . Years of education: Not on file  . Highest education level: Not on file  Occupational History  . Not on file  Social Needs  .  Financial resource strain: Not on file  . Food insecurity    Worry: Not on file    Inability: Not on file  . Transportation needs    Medical: Not on file    Non-medical: Not on file  Tobacco Use  . Smoking status: Never Smoker  . Smokeless tobacco: Never Used  Substance and Sexual Activity  . Alcohol use: No  . Drug use: No  . Sexual activity: Not on file    Comment: husband recently died from myelodysplastic syndrome in January of 2014  Lifestyle  . Physical activity    Days per week: Not on file    Minutes per session: Not on file  . Stress: Not on file  Relationships  . Social Herbalist on phone: Not on file    Gets together: Not on file    Attends religious service: Not on file    Active member of  club or organization: Not on file    Attends meetings of clubs or organizations: Not on file    Relationship status: Not on file  . Intimate partner violence    Fear of current or ex partner: Not on file    Emotionally abused: Not on file    Physically abused: Not on file    Forced sexual activity: Not on file  Other Topics Concern  . Not on file  Social History Narrative  . Not on file    FAMILY HISTORY: Family History  Problem Relation Age of Onset  . Breast cancer Mother 61       now 17yo  . Stroke Father        Father had no full sisters  . Breast cancer Sister 68       now 86yo; had TAH/BSO  . Lung cancer Brother        died 22yo; smoker    ALLERGIES:  is allergic to aspirin; hydrocodone; oxycodone; and tape.  MEDICATIONS:  Current Outpatient Medications  Medication Sig Dispense Refill  . acetaminophen (TYLENOL) 500 MG tablet Take 1,000 mg by mouth daily as needed for headache.    Marland Kitchen acyclovir (ZOVIRAX) 400 MG tablet Take 1 tablet (400 mg total) by mouth 3 (three) times daily. (Patient taking differently: Take 400 mg by mouth 3 (three) times daily as needed (fever blisters). ) 30 tablet 0  . ALPRAZolam (XANAX) 0.5 MG tablet TAKE 1 TABLET(0.5 MG) BY MOUTH AT BEDTIME AS NEEDED 60 tablet 0  . calcium carbonate (OS-CAL) 600 MG TABS Take 600 mg by mouth daily.     . cholecalciferol (VITAMIN D) 1000 UNITS tablet Take 1,000 Units by mouth daily.    . cimetidine (TAGAMET) 400 MG tablet Take 1 tablet (400 mg total) by mouth 2 (two) times daily as needed. 60 tablet 5  . denosumab (PROLIA) 60 MG/ML SOSY injection Inject 60 mg into the skin every 6 (six) months.    . fluticasone (FLONASE) 50 MCG/ACT nasal spray Place 1 spray into both nostrils daily.    Marland Kitchen loratadine (CLARITIN) 10 MG tablet Take 10 mg by mouth daily.    . Multiple Vitamin (MULTIVITAMIN WITH MINERALS) TABS tablet Take 1 tablet by mouth daily.    . Multiple Vitamins-Minerals (EYE VITAMINS PO) Take by mouth daily.    .  pantoprazole (PROTONIX) 40 MG tablet Take 1 tablet (40 mg total) by mouth 2 (two) times daily. 180 tablet 3  . rosuvastatin (CRESTOR) 20 MG tablet TAKE 1 TABLET DAILY 90 tablet 3  .  traMADol (ULTRAM) 50 MG tablet TAKE 1 TABLET(50 MG) BY MOUTH EVERY 6 HOURS AS NEEDED (Patient not taking: Reported on 11/12/2018) 30 tablet 0  . zinc sulfate 220 (50 Zn) MG capsule Take by mouth.     Current Facility-Administered Medications  Medication Dose Route Frequency Provider Last Rate Last Dose  . denosumab (PROLIA) injection 60 mg  60 mg Subcutaneous Q6 months Susy Frizzle, MD   60 mg at 08/11/18 1009    REVIEW OF SYSTEMS:   Constitutional: Denies fevers, chills or abnormal night sweats Eyes: Denies blurriness of vision, double vision or watery eyes Ears, nose, mouth, throat, and face: Denies mucositis or sore throat Respiratory: Denies cough, dyspnea or wheezes Cardiovascular: Denies palpitation, chest discomfort or lower extremity swelling Gastrointestinal:  Denies nausea, heartburn or change in bowel habits Skin: Denies abnormal skin rashes Lymphatics: Denies new lymphadenopathy or easy bruising Neurological:Denies numbness, tingling or new weaknesses Behavioral/Psych: Mood is stable, no new changes  All other systems were reviewed with the patient and are negative.  PHYSICAL EXAMINATION: ECOG PERFORMANCE STATUS: 1 - Symptomatic but completely ambulatory  Vitals:   11/12/18 1507  BP: 120/64  Pulse: 86  Resp: 18  Temp: 98 F (36.7 C)  SpO2: 99%   Filed Weights   11/12/18 1507  Weight: 129 lb 1.6 oz (58.6 kg)    GENERAL:alert, no distress and comfortable SKIN: skin color, texture, turgor are normal, no rashes or significant lesions EYES: normal, conjunctiva are pink and non-injected, sclera clear OROPHARYNX:no exudate, no erythema and lips, buccal mucosa, and tongue normal  NECK: supple, thyroid normal size, non-tender, without nodularity LYMPH:  no palpable lymphadenopathy in  the cervical, axillary or inguinal LUNGS: clear to auscultation and percussion with normal breathing effort HEART: regular rate & rhythm and no murmurs and no lower extremity edema ABDOMEN:abdomen soft, non-tender and normal bowel sounds Musculoskeletal:no cyanosis of digits and no clubbing  PSYCH: alert & oriented x 3 with fluent speech NEURO: no focal motor/sensory deficits  LABORATORY DATA:  I have reviewed the data as listed Recent Results (from the past 2160 hour(s))  CBC with Differential     Status: None   Collection Time: 11/12/18  2:30 PM  Result Value Ref Range   WBC 6.1 4.0 - 10.5 K/uL   RBC 4.33 3.87 - 5.11 MIL/uL   Hemoglobin 13.8 12.0 - 15.0 g/dL   HCT 41.1 36.0 - 46.0 %   MCV 94.9 80.0 - 100.0 fL   MCH 31.9 26.0 - 34.0 pg   MCHC 33.6 30.0 - 36.0 g/dL   RDW 12.2 11.5 - 15.5 %   Platelets 199 150 - 400 K/uL   nRBC 0.0 0.0 - 0.2 %   Neutrophils Relative % 61 %   Neutro Abs 3.8 1.7 - 7.7 K/uL   Lymphocytes Relative 26 %   Lymphs Abs 1.6 0.7 - 4.0 K/uL   Monocytes Relative 10 %   Monocytes Absolute 0.6 0.1 - 1.0 K/uL   Eosinophils Relative 2 %   Eosinophils Absolute 0.1 0.0 - 0.5 K/uL   Basophils Relative 1 %   Basophils Absolute 0.0 0.0 - 0.1 K/uL   Immature Granulocytes 0 %   Abs Immature Granulocytes 0.01 0.00 - 0.07 K/uL    Comment: Performed at [facility], [address] [place], [place] [address]  Comprehensive metabolic panel     Status: Abnormal   Collection Time: 11/12/18  2:30 PM  Result Value Ref Range   Sodium 140 135 - 145  mmol/L   Potassium 3.8 3.5 - 5.1 mmol/L   Chloride 103 98 - 111 mmol/L   CO2 27 22 - 32 mmol/L   Glucose, Bld 113 (H) 70 - 99 mg/dL   BUN 15 8 - 23 mg/dL   Creatinine, Ser 0.79 0.44 - 1.00 mg/dL   Calcium 8.9 8.9 - 10.3 mg/dL   Total Protein 7.0 6.5 - 8.1 g/dL   Albumin 4.2 3.5 - 5.0 g/dL   AST 24 15 - 41 U/L   ALT 18 0 - 44 U/L   Alkaline Phosphatase 41 38 - 126 U/L   Total Bilirubin 0.6 0.3 - 1.2 mg/dL   GFR calc non  Af Amer >60 >60 mL/min   GFR calc Af Amer >60 >60 mL/min   Anion gap 10 5 - 15    Comment: Performed at [facility], [address], [place], [place] [address]    RADIOGRAPHIC STUDIES: I have personally reviewed the radiological images as listed and agreed with the findings in the report.  ASSESSMENT & PLAN:  Von Willebrand disease 1.  Clinical von Willebrand's disease: - Patient sent to is by GI prior to colonoscopy and possible biopsy. - She carries a diagnosis of von Willebrand's disease.  She was reportedly seen by Dr. [name], at [facility]. - History positive for major hemorrhage after tonsillectomy at age of 75 requiring blood transfusion.  She also had hemorrhage after normal vaginal delivery 12 days after second childbirth. As she had to have packing done after every time she had tooth pulled.  She had major bleeding after hysterectomy 1996. - She reported having received DDAVP prior to most surgical procedures after that which went on without any problems.  Last procedure was right breast implant replacement last year. - She reports that she always had easy bruising on the legs all her life.  Denies any nosebleeds.  No joint bleeds or muscular bleeds reported.  Does not report any family history of von Willebrand's disease or other bleeding diathesis. - I have reviewed labs from 2019 which showed normal von Willebrand antigen, factor VIII and the ristocetin cofactor. - I have recommended repeating labs including von Willebrand's profile, PT, PTT and fibrinogen. - Factor IX, factor VII activity, factor X activity, 11 and 12 were all normal when tested in May 2019.  I would not want to repeat this test. - We will schedule for a telephone visit for follow-up of these labs. -We will schedule her for DDAVP by contacting GI.   Total time spent is 45 minutes with more than 50% of the time spent face-to-face discussing diagnosis, treatment plan and coordination of care.  All  questions were answered. The patient knows to call the clinic with any problems, questions or concerns.      Derek Jack, MD 11/12/18 5:58 PM

## 2018-11-13 ENCOUNTER — Inpatient Hospital Stay (HOSPITAL_COMMUNITY): Payer: Medicare Other

## 2018-11-13 DIAGNOSIS — Z8673 Personal history of transient ischemic attack (TIA), and cerebral infarction without residual deficits: Secondary | ICD-10-CM | POA: Diagnosis not present

## 2018-11-13 DIAGNOSIS — E7800 Pure hypercholesterolemia, unspecified: Secondary | ICD-10-CM | POA: Diagnosis not present

## 2018-11-13 DIAGNOSIS — D6800 Von Willebrand disease, unspecified: Secondary | ICD-10-CM

## 2018-11-13 DIAGNOSIS — K219 Gastro-esophageal reflux disease without esophagitis: Secondary | ICD-10-CM | POA: Diagnosis not present

## 2018-11-13 DIAGNOSIS — M797 Fibromyalgia: Secondary | ICD-10-CM | POA: Diagnosis not present

## 2018-11-13 DIAGNOSIS — Z853 Personal history of malignant neoplasm of breast: Secondary | ICD-10-CM | POA: Diagnosis not present

## 2018-11-13 LAB — PROTIME-INR
INR: 1 (ref 0.8–1.2)
Prothrombin Time: 13.3 seconds (ref 11.4–15.2)

## 2018-11-13 LAB — APTT: aPTT: 31 seconds (ref 24–36)

## 2018-11-13 LAB — FIBRINOGEN: Fibrinogen: 396 mg/dL (ref 210–475)

## 2018-11-14 LAB — VON WILLEBRAND PANEL
Coagulation Factor VIII: 142 % — ABNORMAL HIGH (ref 56–140)
Ristocetin Co-factor, Plasma: 116 % (ref 50–200)
Von Willebrand Antigen, Plasma: 152 % (ref 50–200)

## 2018-11-14 LAB — COAG STUDIES INTERP REPORT

## 2018-11-16 ENCOUNTER — Other Ambulatory Visit: Payer: Self-pay | Admitting: Family Medicine

## 2018-11-16 DIAGNOSIS — Z0000 Encounter for general adult medical examination without abnormal findings: Secondary | ICD-10-CM

## 2018-11-16 DIAGNOSIS — F4321 Adjustment disorder with depressed mood: Secondary | ICD-10-CM

## 2018-11-17 NOTE — Telephone Encounter (Signed)
Requested Prescriptions   Pending Prescriptions Disp Refills  . ALPRAZolam (XANAX) 0.5 MG tablet [Pharmacy Med Name: ALPRAZOLAM 0.5MG  TABLETS] 60 tablet     Sig: TAKE 1 TABLET(0.5 MG) BY MOUTH AT BEDTIME AS NEEDED   Last OV 07/24/2018 Last written 09/18/2018

## 2018-11-26 ENCOUNTER — Other Ambulatory Visit: Payer: Self-pay

## 2018-11-26 ENCOUNTER — Inpatient Hospital Stay (HOSPITAL_COMMUNITY): Payer: Medicare Other | Attending: Hematology | Admitting: Hematology

## 2018-11-26 ENCOUNTER — Encounter (HOSPITAL_COMMUNITY): Payer: Self-pay | Admitting: Hematology

## 2018-11-26 DIAGNOSIS — D6800 Von Willebrand disease, unspecified: Secondary | ICD-10-CM

## 2018-11-26 NOTE — Progress Notes (Signed)
Virtual Visit via Telephone Note  I connected with Ross Hefferan Ziesmer on 11/26/18 at  4:05 PM EDT by telephone and verified that I am speaking with the correct person using two identifiers.   I discussed the limitations, risks, security and privacy concerns of performing an evaluation and management service by telephone and the availability of in person appointments. I also discussed with the patient that there may be a patient responsible charge related to this service. The patient expressed understanding and agreed to proceed.   History of Present Illness: Clinical von Willebrand's disease.  She had history of major hemorrhage after tonsillectomy and hemorrhage after normal vaginal delivery and hysterectomy.  She did have a history of receiving DDAVP prior to surgical procedures in the past.   Observations/Objective: She does not report any bleeding.  Denies any nosebleeds.  She does have easy bruising on her legs.  Assessment and Plan: Clinical von Willebrand's disease: -I reviewed results of von Willebrand screen panel which is within normal limits. -I would recommend DDAVP 0.3 mcg/kg given over 15-30 minutes, 30 minutes prior to the anticipated procedure.  Follow Up Instructions: RTC PRN.   I discussed the assessment and treatment plan with the patient. The patient was provided an opportunity to ask questions and all were answered. The patient agreed with the plan and demonstrated an understanding of the instructions.   The patient was advised to call back or seek an in-person evaluation if the symptoms worsen or if the condition fails to improve as anticipated.  I provided 12 minutes of non-face-to-face time during this encounter.   Derek Jack, MD

## 2018-11-28 ENCOUNTER — Telehealth: Payer: Self-pay | Admitting: Internal Medicine

## 2018-11-28 ENCOUNTER — Other Ambulatory Visit: Payer: Self-pay

## 2018-11-28 ENCOUNTER — Telehealth: Payer: Self-pay | Admitting: Gastroenterology

## 2018-11-28 DIAGNOSIS — D6800 Von Willebrand disease, unspecified: Secondary | ICD-10-CM

## 2018-11-28 DIAGNOSIS — Z1211 Encounter for screening for malignant neoplasm of colon: Secondary | ICD-10-CM

## 2018-11-28 MED ORDER — PEG 3350-KCL-NA BICARB-NACL 420 G PO SOLR: 4000.0000 mL | ORAL | 0 refills | Status: DC

## 2018-11-28 NOTE — Telephone Encounter (Signed)
See other phone note from [facility].

## 2018-11-28 NOTE — Telephone Encounter (Signed)
[phone] PLEASE CALL PATIENT ABOUT SCHEDULING HER TCS

## 2018-11-28 NOTE — Telephone Encounter (Signed)
Called pt, TCS w/Propofol w/RMR scheduled for 02/05/19 at 2:30pm. Rx for prep sent to pharmacy. Orders entered.  Called endo and informed Threasa Beards pt needs DDAVP prior to procedure (also placed in procedure order).

## 2018-11-28 NOTE — Telephone Encounter (Addendum)
Patient has seen hematology and we are ready to schedule her screening colonoscopy. Dr. Delton Coombes is recommending DDAVP prior to TCS as outlined below.   Schedule colonoscopy with propofol with Dr. Gala Romney.  She will need to receive DDAVP 17 mcg IV given over 15 to 30 minutes, completed 30 minutes prior to the procedure. (You will likely need to call endo to find out how to do this and make sure it is available day of procedure)

## 2018-12-01 NOTE — Telephone Encounter (Signed)
Pre-op appt 02/03/19 at 11:00am. Letter mailed with procedure instructions.

## 2018-12-09 ENCOUNTER — Telehealth: Payer: Self-pay

## 2018-12-09 NOTE — Telephone Encounter (Signed)
RMR will not be available 02/05/19. TCS w/Propofol needs to be rescheduled. Tried to call pt, no answer, no VM set-up.

## 2018-12-10 NOTE — Telephone Encounter (Signed)
Pre-op appt 01/20/19 at 2:15pm. Letter mailed with new procedure instructions.

## 2018-12-10 NOTE — Telephone Encounter (Signed)
Called pt, TCS rescheduled to 01/22/19 at 1:30pm. LMOVM for endo scheduler.

## 2018-12-17 ENCOUNTER — Other Ambulatory Visit: Payer: Self-pay | Admitting: *Deleted

## 2018-12-17 MED ORDER — CIMETIDINE 400 MG PO TABS: 400.0000 mg | ORAL_TABLET | Freq: Two times a day (BID) | ORAL | 5 refills | Status: DC | PRN

## 2018-12-17 NOTE — Telephone Encounter (Signed)
Received fax from [facility] requesting Rx for cimetidine 400mg  tabs 90 day supply

## 2018-12-17 NOTE — Addendum Note (Signed)
Addended by: Mahala Menghini on: 12/17/2018 10:19 AM   Modules accepted: Orders

## 2018-12-29 ENCOUNTER — Encounter: Payer: Self-pay | Admitting: Family Medicine

## 2019-01-20 ENCOUNTER — Other Ambulatory Visit: Payer: Self-pay

## 2019-01-20 ENCOUNTER — Encounter (HOSPITAL_COMMUNITY)
Admission: RE | Admit: 2019-01-20 | Discharge: 2019-01-20 | Disposition: A | Discharge status: Home / Self Care | Payer: Medicare Other | Source: Ambulatory Visit | Attending: Internal Medicine | Admitting: Internal Medicine

## 2019-01-20 ENCOUNTER — Other Ambulatory Visit (HOSPITAL_COMMUNITY)
Admission: RE | Admit: 2019-01-20 | Discharge: 2019-01-20 | Disposition: A | Discharge status: Home / Self Care | Payer: Medicare Other | Source: Ambulatory Visit | Attending: Internal Medicine | Admitting: Internal Medicine

## 2019-01-20 DIAGNOSIS — Z20828 Contact with and (suspected) exposure to other viral communicable diseases: Secondary | ICD-10-CM | POA: Diagnosis not present

## 2019-01-20 DIAGNOSIS — Z01812 Encounter for preprocedural laboratory examination: Secondary | ICD-10-CM | POA: Diagnosis not present

## 2019-01-20 LAB — SARS CORONAVIRUS 2 (TAT 6-24 HRS): SARS Coronavirus 2: NEGATIVE

## 2019-01-21 ENCOUNTER — Telehealth: Payer: Self-pay

## 2019-01-21 NOTE — Telephone Encounter (Signed)
Tried to call pt to see if she can arrive earlier tomorrow for TCS, no answer, LMOVM for return call.

## 2019-01-21 NOTE — Telephone Encounter (Signed)
Pt called office, she is unable to arrive earlier for TCS d/t her transportation.

## 2019-01-22 ENCOUNTER — Ambulatory Visit (HOSPITAL_COMMUNITY): Payer: Medicare Other | Admitting: Anesthesiology

## 2019-01-22 ENCOUNTER — Other Ambulatory Visit: Payer: Self-pay

## 2019-01-22 ENCOUNTER — Ambulatory Visit (HOSPITAL_COMMUNITY)
Admission: RE | Admit: 2019-01-22 | Discharge: 2019-01-22 | Disposition: A | Discharge status: Home / Self Care | Payer: Medicare Other | Attending: Internal Medicine | Admitting: Internal Medicine

## 2019-01-22 ENCOUNTER — Encounter (HOSPITAL_COMMUNITY)
Admission: RE | Disposition: A | Discharge status: Home / Self Care | Payer: Self-pay | Source: Home / Self Care | Attending: Internal Medicine

## 2019-01-22 ENCOUNTER — Encounter (HOSPITAL_COMMUNITY): Payer: Self-pay | Admitting: *Deleted

## 2019-01-22 DIAGNOSIS — M797 Fibromyalgia: Secondary | ICD-10-CM | POA: Diagnosis not present

## 2019-01-22 DIAGNOSIS — Z8673 Personal history of transient ischemic attack (TIA), and cerebral infarction without residual deficits: Secondary | ICD-10-CM | POA: Insufficient documentation

## 2019-01-22 DIAGNOSIS — Z1211 Encounter for screening for malignant neoplasm of colon: Secondary | ICD-10-CM | POA: Insufficient documentation

## 2019-01-22 DIAGNOSIS — Z9012 Acquired absence of left breast and nipple: Secondary | ICD-10-CM | POA: Diagnosis not present

## 2019-01-22 DIAGNOSIS — Z85828 Personal history of other malignant neoplasm of skin: Secondary | ICD-10-CM | POA: Insufficient documentation

## 2019-01-22 DIAGNOSIS — K5730 Diverticulosis of large intestine without perforation or abscess without bleeding: Secondary | ICD-10-CM | POA: Diagnosis not present

## 2019-01-22 DIAGNOSIS — Q438 Other specified congenital malformations of intestine: Secondary | ICD-10-CM | POA: Diagnosis not present

## 2019-01-22 DIAGNOSIS — Z853 Personal history of malignant neoplasm of breast: Secondary | ICD-10-CM | POA: Insufficient documentation

## 2019-01-22 DIAGNOSIS — K219 Gastro-esophageal reflux disease without esophagitis: Secondary | ICD-10-CM | POA: Insufficient documentation

## 2019-01-22 DIAGNOSIS — D6800 Von Willebrand disease, unspecified: Secondary | ICD-10-CM

## 2019-01-22 DIAGNOSIS — E7800 Pure hypercholesterolemia, unspecified: Secondary | ICD-10-CM | POA: Insufficient documentation

## 2019-01-22 HISTORY — PX: COLONOSCOPY WITH PROPOFOL: SHX5780

## 2019-01-22 SURGERY — COLONOSCOPY WITH PROPOFOL
Anesthesia: General

## 2019-01-22 MED ORDER — LACTATED RINGERS IV SOLN
INTRAVENOUS | Status: DC
  Administered 2019-01-22: 1000 mL via INTRAVENOUS

## 2019-01-22 MED ORDER — PROPOFOL 500 MG/50ML IV EMUL
INTRAVENOUS | Status: DC | PRN
  Administered 2019-01-22: 150 ug/kg/min via INTRAVENOUS
  Administered 2019-01-22: 225 ug/kg/min via INTRAVENOUS

## 2019-01-22 MED ORDER — PROMETHAZINE HCL 25 MG/ML IJ SOLN: 6.2500 mg | INTRAMUSCULAR | Status: DC | PRN

## 2019-01-22 MED ORDER — KETAMINE HCL 50 MG/5ML IJ SOSY
PREFILLED_SYRINGE | INTRAMUSCULAR | Status: AC
  Filled 2019-01-22: qty 5

## 2019-01-22 MED ORDER — MIDAZOLAM HCL 2 MG/2ML IJ SOLN: 0.5000 mg | Freq: Once | INTRAMUSCULAR | Status: DC | PRN

## 2019-01-22 MED ORDER — LIDOCAINE HCL (CARDIAC) PF 100 MG/5ML IV SOSY
PREFILLED_SYRINGE | INTRAVENOUS | Status: DC | PRN
  Administered 2019-01-22: 40 mg via INTRAVENOUS

## 2019-01-22 MED ORDER — PROPOFOL 10 MG/ML IV BOLUS
INTRAVENOUS | Status: DC | PRN
  Administered 2019-01-22 (×2): 20 mg via INTRAVENOUS

## 2019-01-22 MED ORDER — KETAMINE HCL 10 MG/ML IJ SOLN
INTRAMUSCULAR | Status: DC | PRN
  Administered 2019-01-22: 20 mg via INTRAVENOUS
  Administered 2019-01-22: 10 mg via INTRAVENOUS

## 2019-01-22 MED ORDER — PROPOFOL 10 MG/ML IV BOLUS
INTRAVENOUS | Status: AC
  Filled 2019-01-22: qty 20

## 2019-01-22 MED ORDER — SODIUM CHLORIDE 0.9 % IV SOLN
17.0000 ug | Freq: Once | INTRAVENOUS | Status: AC
  Administered 2019-01-22: 17.2 ug via INTRAVENOUS
  Filled 2019-01-22: qty 4.3

## 2019-01-22 NOTE — Transfer of Care (Signed)
Immediate Anesthesia Transfer of Care Note  Patient: Caroline Valdez  Procedure(s) Performed: COLONOSCOPY WITH PROPOFOL (N/A )  Patient Location: PACU  Anesthesia Type:General  Level of Consciousness: awake, alert , oriented and patient cooperative  Airway & Oxygen Therapy: Patient Spontanous Breathing  Post-op Assessment: Report given to RN and Post -op Vital signs reviewed and stable  Post vital signs: Reviewed and stable  Last Vitals:  Vitals Value Taken Time  BP 105/49 01/22/19 1345  Temp 36.8 C 01/22/19 1345  Pulse 83 01/22/19 1352  Resp 15 01/22/19 1352  SpO2 100 % 01/22/19 1352  Vitals shown include unvalidated device data.  Last Pain:  Vitals:   01/22/19 1345  TempSrc:   PainSc: 0-No pain      Patients Stated Pain Goal: 4 ([id number])  Complications: No apparent anesthesia complications

## 2019-01-22 NOTE — Anesthesia Postprocedure Evaluation (Signed)
Anesthesia Post Note  Patient: Caroline Valdez  Procedure(s) Performed: COLONOSCOPY WITH PROPOFOL (N/A )  Patient location during evaluation: PACU Anesthesia Type: General Level of consciousness: awake and alert Pain management: pain level controlled Vital Signs Assessment: post-procedure vital signs reviewed and stable Respiratory status: spontaneous breathing Cardiovascular status: stable Postop Assessment: no apparent nausea or vomiting Anesthetic complications: no     Last Vitals:  Vitals:   01/22/19 1235 01/22/19 1345  BP: 122/73 (!) 105/49  Pulse: 96 92  Resp: 16 (!) 21  Temp: 36.7 C 36.8 C  SpO2: 99% 100%    Last Pain:  Vitals:   01/22/19 1345  TempSrc:   PainSc: 0-No pain                 ADAMS, AMY A

## 2019-01-22 NOTE — Anesthesia Procedure Notes (Signed)
Procedure Name: General with mask airway Performed by: Angline Schweigert A, CRNA Pre-anesthesia Checklist: Timeout performed, Patient being monitored, Suction available, Emergency Drugs available and Patient identified Oxygen Delivery Method: Non-rebreather mask       

## 2019-01-22 NOTE — Addendum Note (Signed)
Addendum  created 01/22/19 1405 by Mickel Baas, CRNA   Charge Capture section accepted

## 2019-01-22 NOTE — Anesthesia Preprocedure Evaluation (Signed)
Anesthesia Evaluation  Patient identified by MRN, date of birth, ID band Patient awake  General Assessment Comment:Has [place]. Had bleed after a few procedures  States hasn't bled when they started with pretreatment of ddAVP  Reviewed: Allergy & Precautions, NPO status , Patient's Chart, lab work & pertinent test results  History of Anesthesia Complications (+) history of anesthetic complications  Airway Mallampati: I  TM Distance: >3 FB Neck ROM: Full    Dental no notable dental hx. (+) Teeth Intact   Pulmonary neg pulmonary ROS,    Pulmonary exam normal breath sounds clear to auscultation       Cardiovascular Exercise Tolerance: Good negative cardio ROS Normal cardiovascular examI Rhythm:Regular Rate:Normal     Neuro/Psych  Neuromuscular disease CVA, No Residual Symptoms negative psych ROS   GI/Hepatic Neg liver ROS, GERD  Medicated and Controlled,Here for screening colonscopy    Endo/Other  negative endocrine ROS  Renal/GU negative Renal ROS  negative genitourinary   Musculoskeletal  (+) Fibromyalgia -, narcotic dependentOn ultram   Abdominal   Peds negative pediatric ROS (+)  Hematology  (+) Blood dyscrasia, , As above   Anesthesia Other Findings   Reproductive/Obstetrics negative OB ROS                             Anesthesia Physical Anesthesia Plan  ASA: III  Anesthesia Plan: General   Post-op Pain Management:    Induction: Intravenous  PONV Risk Score and Plan: 3 and Propofol infusion, TIVA, Ondansetron and Treatment may vary due to age or medical condition  Airway Management Planned: Simple Face Mask and Nasal Cannula  Additional Equipment:   Intra-op Plan:   Post-operative Plan:   Informed Consent: I have reviewed the patients History and Physical, chart, labs and discussed the procedure including the risks, benefits and alternatives for the proposed  anesthesia with the patient or authorized representative who has indicated his/her understanding and acceptance.     Dental advisory given  Plan Discussed with: CRNA  Anesthesia Plan Comments: (Plan Full PPE use  Plan GA with GETA as needed d/w pt -WTP with same after Q&A  Getting 17 ucg ddAVP as a pretreatment )        Anesthesia Quick Evaluation

## 2019-01-22 NOTE — Discharge Instructions (Signed)
Diverticulosis  Diverticulosis is a condition that develops when small pouches (diverticula) form in the wall of the large intestine (colon). The colon is where water is absorbed and stool is formed. The pouches form when the inside layer of the colon pushes through weak spots in the outer layers of the colon. You may have a few pouches or many of them. What are the causes? The cause of this condition is not known. What increases the risk? The following factors may make you more likely to develop this condition:  Being older than age 85. Your risk for this condition increases with age. Diverticulosis is rare among people younger than age 3. By age 69, many people have it.  Eating a low-fiber diet.  Having frequent constipation.  Being overweight.  Not getting enough exercise.  Smoking.  Taking over-the-counter pain medicines, like aspirin and ibuprofen.  Having a family history of diverticulosis. What are the signs or symptoms? In most people, there are no symptoms of this condition. If you do have symptoms, they may include:  Bloating.  Cramps in the abdomen.  Constipation or diarrhea.  Pain in the lower left side of the abdomen. How is this diagnosed? This condition is most often diagnosed during an exam for other colon problems. Because diverticulosis usually has no symptoms, it often cannot be diagnosed independently. This condition may be diagnosed by:  Using a flexible scope to examine the colon (colonoscopy).  Taking an X-ray of the colon after dye has been put into the colon (barium enema).  Doing a CT scan. How is this treated? You may not need treatment for this condition if you have never developed an infection related to diverticulosis. If you have had an infection before, treatment may include:  Eating a high-fiber diet. This may include eating more fruits, vegetables, and grains.  Taking a fiber supplement.  Taking a live bacteria supplement  (probiotic).  Taking medicine to relax your colon.  Taking antibiotic medicines. Follow these instructions at home:  Drink 6-8 glasses of water or more each day to prevent constipation.  Try not to strain when you have a bowel movement.  If you have had an infection before: ? Eat more fiber as directed by your health care provider or your diet and nutrition specialist (dietitian). ? Take a fiber supplement or probiotic, if your health care provider approves.  Take over-the-counter and prescription medicines only as told by your health care provider.  If you were prescribed an antibiotic, take it as told by your health care provider. Do not stop taking the antibiotic even if you start to feel better.  Keep all follow-up visits as told by your health care provider. This is important. Contact a health care provider if:  You have pain in your abdomen.  You have bloating.  You have cramps.  You have not had a bowel movement in 3 days. Get help right away if:  Your pain gets worse.  Your bloating becomes very bad.  You have a fever or chills, and your symptoms suddenly get worse.  You vomit.  You have bowel movements that are bloody or black.  You have bleeding from your rectum. Summary  Diverticulosis is a condition that develops when small pouches (diverticula) form in the wall of the large intestine (colon).  You may have a few pouches or many of them.  This condition is most often diagnosed during an exam for other colon problems.  If you have had an infection related to  diverticulosis, treatment may include increasing the fiber in your diet, taking supplements, or taking medicines. This information is not intended to replace advice given to you by your health care provider. Make sure you discuss any questions you have with your health care provider. Document Released: 02/02/2004 Document Revised: 04/19/2017 Document Reviewed: 03/26/2016 Elsevier Patient Education   2020 [employer].  Colonoscopy Discharge Instructions  Read the instructions outlined below and refer to this sheet in the next few weeks. These discharge instructions provide you with general information on caring for yourself after you leave the hospital. Your doctor may also give you specific instructions. While your treatment has been planned according to the most current medical practices available, unavoidable complications occasionally occur. If you have any problems or questions after discharge, call Dr. Gala Romney at [phone]. ACTIVITY  You may resume your regular activity, but move at a slower pace for the next 24 hours.   Take frequent rest periods for the next 24 hours.   Walking will help get rid of the air and reduce the bloated feeling in your belly (abdomen).   No driving for 24 hours (because of the medicine (anesthesia) used during the test).    Do not sign any important legal documents or operate any machinery for 24 hours (because of the anesthesia used during the test).  NUTRITION  Drink plenty of fluids.   You may resume your normal diet as instructed by your doctor.   Begin with a light meal and progress to your normal diet. Heavy or fried foods are harder to digest and may make you feel sick to your stomach (nauseated).   Avoid alcoholic beverages for 24 hours or as instructed.  MEDICATIONS  You may resume your normal medications unless your doctor tells you otherwise.  WHAT YOU CAN EXPECT TODAY  Some feelings of bloating in the abdomen.   Passage of more gas than usual.   Spotting of blood in your stool or on the toilet paper.  IF YOU HAD POLYPS REMOVED DURING THE COLONOSCOPY:  No aspirin products for 7 days or as instructed.   No alcohol for 7 days or as instructed.   Eat a soft diet for the next 24 hours.  FINDING OUT THE RESULTS OF YOUR TEST Not all test results are available during your visit. If your test results are not back during the visit,  make an appointment with your caregiver to find out the results. Do not assume everything is normal if you have not heard from your caregiver or the medical facility. It is important for you to follow up on all of your test results.  SEEK IMMEDIATE MEDICAL ATTENTION IF:  You have more than a spotting of blood in your stool.   Your belly is swollen (abdominal distention).   You are nauseated or vomiting.   You have a temperature over 101.   You have abdominal pain or discomfort that is severe or gets worse throughout the day.   Colon diverticulosis information provided  I do not recommend future colonoscopy unless new symptoms develop  At patient's request I called her Rayetta Pigg, at [phone] and discussed results

## 2019-01-22 NOTE — H&P (Signed)
@LOGO @   Primary Care Physician:  Susy Frizzle, MD Primary Gastroenterologist:  Dr. Gala Romney  Pre-Procedure History & Physical: HPI:  Caroline Valdez is a 68 y.o. female is here for a screening colonoscopy.  Negative colonoscopy about 10 years ago.  Reported history of von Willebrand's disease.  Has received DDAVP in the past.  Will receive it for the procedure today.  Past Medical History:  Diagnosis Date  . Blood dyscrasia   . Breast cancer ([place])    left  . Complication of anesthesia    1980S  TUBAL LIG, + HEMM SURGERY PT PASSED OUT AND WENT INTO SHOCK.  NO PROBLEMS SINCE   . 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 Endometriosis   . Fibromyalgia   . GERD (gastroesophageal reflux disease)   . Hypercholesteremia   . Mini stroke ([place]) 09/2012  . Radiculopathy   . Stroke [facility])    MINI STROKE  . Von Willebrand disease ([place]) 09/11/2013  . Von Willebrand's disease [facility])    history of    Past Surgical History:  Procedure Laterality Date  . ABDOMINAL HYSTERECTOMY    . BACK SURGERY    . BREAST IMPLANT REMOVAL Right 12/05/2017  . BREAST IMPLANT REMOVAL Right 12/05/2017   Procedure: REMOVAL RIGHT  BREAST IMPLANT MATERIAL;  Surgeon: Crissie Reese, MD;  Location: [place];  Service: Plastics;  Laterality: Right;  . BREAST RECONSTRUCTION    . BREAST RECONSTRUCTION Right 12/05/2017   Procedure: DELAYED BREAST RECONSTRUCTION WITH SILICONE IMPLANT;  Surgeon: Crissie Reese, MD;  Location: [place];  Service: Plastics;  Laterality: Right;  . CARPAL TUNNEL RELEASE     right  . COLONOSCOPY  01/2010   Dr. Aviva Signs: normal  . FINGER SURGERY     LEFT THUMB  . HEMORRHOID SURGERY     X2  . LAMINECTOMY    . MASTECTOMY     left  . SHOULDER SURGERY    . SKIN CANCER EXCISION    . TONSILLECTOMY    . TUBAL LIGATION      Prior to Admission medications   Medication Sig Start Date End Date Taking? Authorizing Provider  acetaminophen (TYLENOL) 500 MG tablet Take 1,000 mg by mouth  daily as needed for headache.   Yes [provider]  acyclovir (ZOVIRAX) 400 MG tablet Take 1 tablet (400 mg total) by mouth 3 (three) times daily. Patient taking differently: Take 400 mg by mouth 3 (three) times daily as needed (fever blisters).  08/06/17  Yes Susy Frizzle, MD  ALPRAZolam Duanne Moron) 0.5 MG tablet TAKE 1 TABLET(0.5 MG) BY MOUTH AT BEDTIME AS NEEDED Patient taking differently: 0.5 mg at bedtime as needed for sleep.  11/17/18  Yes Beverly Beach, Modena Nunnery, MD  calcium carbonate (OS-CAL) 600 MG TABS Take 600 mg by mouth daily.    Yes [provider]  cholecalciferol (VITAMIN D) 1000 UNITS tablet Take 1,000 Units by mouth daily.   Yes [provider]  cimetidine (TAGAMET) 400 MG tablet Take 1 tablet (400 mg total) by mouth 2 (two) times daily as needed. Patient taking differently: Take 400 mg by mouth 2 (two) times daily as needed (acid reflux).  12/17/18  Yes Mahala Menghini, PA-C  fluticasone (FLONASE) 50 MCG/ACT nasal spray Place 1 spray into both nostrils daily.   Yes [provider]  loratadine (CLARITIN) 10 MG tablet Take 10 mg by mouth daily.   Yes [provider]  Multiple Vitamin (MULTIVITAMIN WITH MINERALS) TABS tablet  Take 1 tablet by mouth daily.   Yes [provider]  Multiple Vitamins-Minerals (EYE VITAMINS PO) Take 1 tablet by mouth daily.    Yes [provider]  pantoprazole (PROTONIX) 40 MG tablet Take 1 tablet (40 mg total) by mouth 2 (two) times daily. 08/11/18  Yes Susy Frizzle, MD  rosuvastatin (CRESTOR) 20 MG tablet TAKE 1 TABLET DAILY 09/12/18  Yes Susy Frizzle, MD  zinc sulfate 220 (50 Zn) MG capsule Take 220 mg by mouth daily.    Yes [provider]  denosumab (PROLIA) 60 MG/ML SOSY injection Inject 60 mg into the skin every 6 (six) months.    [provider]  traMADol (ULTRAM) 50 MG tablet TAKE 1 TABLET(50 MG) BY MOUTH EVERY 6 HOURS AS NEEDED Patient taking differently: Take 50 mg  by mouth every 6 (six) hours as needed (pain). TAKE 1 TABLET(50 MG) BY MOUTH EVERY 6 HOURS AS NEEDED 05/20/18   Susy Frizzle, MD    Allergies as of 11/28/2018 - Review Complete 11/26/2018  Allergen Reaction Noted  . Aspirin Other (See Comments) 02/15/2011  . Hydrocodone Itching 03/31/2018  . Oxycodone Itching 03/31/2018  . Tape Dermatitis 06/21/2015    Family History  Problem Relation Age of Onset  . Breast cancer Mother 30       now 40yo  . Stroke Father        Father had no full sisters  . Breast cancer Sister 25       now 12yo; had TAH/BSO  . Lung cancer Brother        died 86yo; smoker    Social History   Socioeconomic History  . Marital status: Widowed    Spouse name: Not on file  . Number of children: Not on file  . Years of education: Not on file  . Highest education level: Not on file  Occupational History  . Not on file  Social Needs  . Financial resource strain: Not on file  . Food insecurity    Worry: Not on file    Inability: Not on file  . Transportation needs    Medical: Not on file    Non-medical: Not on file  Tobacco Use  . Smoking status: Never Smoker  . Smokeless tobacco: Never Used  Substance and Sexual Activity  . Alcohol use: No  . Drug use: No  . Sexual activity: Not on file    Comment: husband recently died from myelodysplastic syndrome in January of 2014  Lifestyle  . Physical activity    Days per week: Not on file    Minutes per session: Not on file  . Stress: Not on file  Relationships  . Social Herbalist on phone: Not on file    Gets together: Not on file    Attends religious service: Not on file    Active member of club or organization: Not on file    Attends meetings of clubs or organizations: Not on file    Relationship status: Not on file  . Intimate partner violence    Fear of current or ex partner: Not on file    Emotionally abused: Not on file    Physically abused: Not on file    Forced sexual  activity: Not on file  Other Topics Concern  . Not on file  Social History Narrative  . Not on file    Review of Systems: See HPI, otherwise negative ROS  Physical Exam: BP 122/73  Pulse 96   Temp 98 F (36.7 C) (Oral)   Resp 16   Ht 5' 5.5" (1.664 m)   Wt 57.6 kg   SpO2 99%   BMI 20.81 kg/m  General:   Alert,  Well-developed, well-nourished, pleasant and cooperative in NAD Neck:  Supple; no masses or thyromegaly. Lungs:  Clear throughout to auscultation.   No wheezes, crackles, or rhonchi. No acute distress. Heart:  Regular rate and rhythm; no murmurs, clicks, rubs,  or gallops. Abdomen:  Soft, nontender and nondistended. No masses, hepatosplenomegaly or hernias noted. Normal bowel sounds, without guarding, and without rebound.    Impression/Plan: Caroline Valdez is now here to undergo a screening colonoscopy.  Risks, benefits, limitations, imponderables and alternatives regarding colonoscopy have been reviewed with the patient. Questions have been answered. All parties agreeable.     Notice:  This dictation was prepared with Dragon dictation along with smaller phrase technology. Any transcriptional errors that result from this process are unintentional and may not be corrected upon review.

## 2019-01-22 NOTE — Op Note (Signed)
[facility] Patient Name: Caroline Valdez Procedure Date: 01/22/2019 12:54 PM MRN: [id number] Date of Birth: November 05, 1950 Attending MD: Norvel Richards , MD CSN: [id number] Age: 68 Admit Type: Outpatient Procedure:                Colonoscopy Indications:              Screening for colorectal malignant neoplasm Providers:                Norvel Richards, MD, Otis Peak B. Sharon Seller, RN,                            Aram Candela Referring MD:              Medicines:                Propofol per Anesthesia Complications:            No immediate complications. Estimated Blood Loss:     Estimated blood loss: none. Procedure:                Pre-Anesthesia Assessment:                           - Prior to the procedure, a History and Physical                            was performed, and patient medications and                            allergies were reviewed. The patient's tolerance of                            previous anesthesia was also reviewed. The risks                            and benefits of the procedure and the sedation                            options and risks were discussed with the patient.                            All questions were answered, and informed consent                            was obtained. Prior Anticoagulants: The patient has                            taken no previous anticoagulant or antiplatelet                            agents. ASA Grade Assessment: II - A patient with                            mild systemic disease. After reviewing the risks  and benefits, the patient was deemed in                            satisfactory condition to undergo the procedure.                           After obtaining informed consent, the colonoscope                            was passed under direct vision. Throughout the                            procedure, the patient's blood pressure, pulse, and                            oxygen  saturations were monitored continuously. The                            [id number]) scope was introduced through                            the anus and advanced to the the cecum, identified                            by appendiceal orifice and ileocecal valve. The                            colonoscopy was performed without difficulty. The                            patient tolerated the procedure well. The quality                            of the bowel preparation was adequate. Scope In: 1:18:07 PM Scope Out: 1:38:41 PM Scope Withdrawal Time: 0 hours 6 minutes 20 seconds  Total Procedure Duration: 0 hours 20 minutes 34 seconds  Findings:      The perianal and digital rectal examinations were normal.      Scattered medium-mouthed diverticula were found in the sigmoid colon and       descending colon. Redundant colon requiring external abdominal pressure       to reach the cecum.      The exam was otherwise without abnormality on direct and retroflexion       views. Impression:               - Diverticulosis in the sigmoid colon and in the                            descending colon. Redundant colon                           - The examination was otherwise normal on direct                            and retroflexion views.                           -  No specimens collected. Moderate Sedation:      Moderate (conscious) sedation was personally administered by an       anesthesia professional. The following parameters were monitored: oxygen       saturation, heart rate, blood pressure, respiratory rate, EKG, adequacy       of pulmonary ventilation, and response to care. Recommendation:           - Patient has a contact number available for                            emergencies. The signs and symptoms of potential                            delayed complications were discussed with the                            patient. Return to normal activities tomorrow.                             Written discharge instructions were provided to the                            patient.                           - Advance diet as tolerated.                           - No repeat colonoscopy due to current age (24                            years or older).                           - Return to GI clinic (date not yet determined). Procedure Code(s):        --- Professional ---                           [phone], Colonoscopy, flexible; diagnostic, including                            collection of specimen(s) by brushing or washing,                            when performed (separate procedure) Diagnosis Code(s):        --- Professional ---                           Z12.11, Encounter for screening for malignant                            neoplasm of colon                           K57.30, Diverticulosis of large intestine without  perforation or abscess without bleeding CPT copyright 2019 [employer]. All rights reserved. The codes documented in this report are preliminary and upon coder review may  be revised to meet current compliance requirements. Cristopher Estimable. Allannah Kempen, MD Norvel Richards, MD 01/22/2019 1:48:00 PM This report has been signed electronically. Number of Addenda: 0

## 2019-01-28 ENCOUNTER — Encounter (HOSPITAL_COMMUNITY): Payer: Self-pay | Admitting: Internal Medicine

## 2019-02-03 ENCOUNTER — Other Ambulatory Visit (HOSPITAL_COMMUNITY): Payer: Medicare Other

## 2019-02-12 ENCOUNTER — Ambulatory Visit: Payer: Medicare Other

## 2019-02-13 ENCOUNTER — Other Ambulatory Visit: Payer: Self-pay

## 2019-02-13 ENCOUNTER — Ambulatory Visit: Payer: Medicare Other

## 2019-02-13 ENCOUNTER — Encounter: Payer: Self-pay | Admitting: Family Medicine

## 2019-02-13 ENCOUNTER — Ambulatory Visit (INDEPENDENT_AMBULATORY_CARE_PROVIDER_SITE_OTHER): Payer: Medicare Other | Admitting: Family Medicine

## 2019-02-13 VITALS — BP 120/60 | HR 90 | Temp 98.4°F | Resp 16 | Ht 65.5 in | Wt 129.0 lb

## 2019-02-13 DIAGNOSIS — Z23 Encounter for immunization: Secondary | ICD-10-CM | POA: Diagnosis not present

## 2019-02-13 DIAGNOSIS — E7800 Pure hypercholesterolemia, unspecified: Secondary | ICD-10-CM

## 2019-02-13 DIAGNOSIS — M810 Age-related osteoporosis without current pathological fracture: Secondary | ICD-10-CM

## 2019-02-13 DIAGNOSIS — F5101 Primary insomnia: Secondary | ICD-10-CM

## 2019-02-13 MED ORDER — TRAZODONE HCL 50 MG PO TABS: 25.0000 mg | ORAL_TABLET | Freq: Every evening | ORAL | 3 refills | Status: DC | PRN

## 2019-02-13 MED ORDER — DENOSUMAB 60 MG/ML ~~LOC~~ SOSY
60.0000 mg | PREFILLED_SYRINGE | Freq: Once | SUBCUTANEOUS | Status: AC
  Administered 2019-02-13: 60 mg via SUBCUTANEOUS

## 2019-02-13 NOTE — Addendum Note (Signed)
Addended by: Shary Decamp B on: 02/13/2019 09:40 AM   Modules accepted: Orders

## 2019-02-13 NOTE — Progress Notes (Signed)
Subjective:    Patient ID: Caroline Valdez, female    DOB: 10/18/1950, 68 y.o.   MRN: [id number]  HPI Patient is a very pleasant 68 year old Caucasian female here today for a checkup of her chronic medical conditions.  She has a history of a TIA with hyper lipidemia and is therefore on a statin to reduce her LDL cholesterol less than 70.  She denies any myalgias or right upper quadrant pain.  Her blood pressure today is well controlled.  Blood pressure is 120/60.  She denies any chest pain, shortness of breath, dyspnea on exertion.  She also has a history of osteoporosis and is currently on Prolia and is scheduled to receive her Prolia injection today.  She is taking calcium and vitamin D.  Her only concern is insomnia.  She is taking half a Xanax at night and this is not strong enough to help her sleep however if she takes a whole Xanax, it leaves her feeling groggy the next day.  She wants to avoid medication that she could become addicted to.  She is interested in a non-habit-forming option to help her sleep.  Patient's insomnia worsened after her mother passed away earlier this year.  Most of the time she lies in bed and stays awake due to anxiety and intrusive thoughts.  She is also due for her flu shot Past Medical History:  Diagnosis Date  . Blood dyscrasia   . Breast cancer ([place])    left  . Complication of anesthesia    1980S  TUBAL LIG, + HEMM SURGERY PT PASSED OUT AND WENT INTO SHOCK.  NO PROBLEMS SINCE   . 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 Endometriosis   . Fibromyalgia   . GERD (gastroesophageal reflux disease)   . Hypercholesteremia   . Mini stroke ([place]) 09/2012  . Radiculopathy   . Stroke [facility])    MINI STROKE  . Von Willebrand disease ([place]) 09/11/2013  . Von Willebrand's disease [facility])    history of   Past Surgical History:  Procedure Laterality Date  . ABDOMINAL HYSTERECTOMY    . BACK SURGERY    . BREAST IMPLANT REMOVAL Right 12/05/2017  . BREAST IMPLANT  REMOVAL Right 12/05/2017   Procedure: REMOVAL RIGHT  BREAST IMPLANT MATERIAL;  Surgeon: Crissie Reese, MD;  Location: [place];  Service: Plastics;  Laterality: Right;  . BREAST RECONSTRUCTION    . BREAST RECONSTRUCTION Right 12/05/2017   Procedure: DELAYED BREAST RECONSTRUCTION WITH SILICONE IMPLANT;  Surgeon: Crissie Reese, MD;  Location: [place];  Service: Plastics;  Laterality: Right;  . CARPAL TUNNEL RELEASE     right  . COLONOSCOPY  01/2010   Dr. Aviva Signs: normal  . COLONOSCOPY WITH PROPOFOL N/A 01/22/2019   Procedure: COLONOSCOPY WITH PROPOFOL;  Surgeon: Daneil Dolin, MD;  [facility];  Service: Endoscopy;  Laterality: N/A;  2:30pm - can not come earlier due to transportation  . FINGER SURGERY     LEFT THUMB  . HEMORRHOID SURGERY     X2  . LAMINECTOMY    . MASTECTOMY     left  . SHOULDER SURGERY    . SKIN CANCER EXCISION    . TONSILLECTOMY    . TUBAL LIGATION     Current Outpatient Medications on File Prior to Visit  Medication Sig Dispense Refill  . acetaminophen (TYLENOL) 500 MG tablet Take 1,000 mg by mouth daily as needed for headache.    Marland Kitchen acyclovir (ZOVIRAX) 400  MG tablet Take 1 tablet (400 mg total) by mouth 3 (three) times daily. (Patient taking differently: Take 400 mg by mouth 3 (three) times daily as needed (fever blisters). ) 30 tablet 0  . ALPRAZolam (XANAX) 0.5 MG tablet TAKE 1 TABLET(0.5 MG) BY MOUTH AT BEDTIME AS NEEDED (Patient taking differently: 0.5 mg at bedtime as needed for sleep. ) 60 tablet 0  . calcium carbonate (OS-CAL) 600 MG TABS Take 600 mg by mouth daily.     . cholecalciferol (VITAMIN D) 1000 UNITS tablet Take 1,000 Units by mouth daily.    . cimetidine (TAGAMET) 400 MG tablet Take 1 tablet (400 mg total) by mouth 2 (two) times daily as needed. (Patient taking differently: Take 400 mg by mouth 2 (two) times daily as needed (acid reflux). ) 180 tablet 5  . denosumab (PROLIA) 60 MG/ML SOSY injection Inject 60 mg into the skin every 6 (six)  months.    . fluticasone (FLONASE) 50 MCG/ACT nasal spray Place 1 spray into both nostrils daily.    Marland Kitchen loratadine (CLARITIN) 10 MG tablet Take 10 mg by mouth daily.    . Multiple Vitamin (MULTIVITAMIN WITH MINERALS) TABS tablet Take 1 tablet by mouth daily.    . Multiple Vitamins-Minerals (EYE VITAMINS PO) Take 1 tablet by mouth daily.     . pantoprazole (PROTONIX) 40 MG tablet Take 1 tablet (40 mg total) by mouth 2 (two) times daily. 180 tablet 3  . rosuvastatin (CRESTOR) 20 MG tablet TAKE 1 TABLET DAILY 90 tablet 3  . traMADol (ULTRAM) 50 MG tablet TAKE 1 TABLET(50 MG) BY MOUTH EVERY 6 HOURS AS NEEDED (Patient taking differently: Take 50 mg by mouth every 6 (six) hours as needed (pain). TAKE 1 TABLET(50 MG) BY MOUTH EVERY 6 HOURS AS NEEDED) 30 tablet 0  . zinc sulfate 220 (50 Zn) MG capsule Take 220 mg by mouth daily.      Current Facility-Administered Medications on File Prior to Visit  Medication Dose Route Frequency Provider Last Rate Last Dose  . denosumab (PROLIA) injection 60 mg  60 mg Subcutaneous Q6 months Susy Frizzle, MD   60 mg at 08/11/18 1009   Allergies  Allergen Reactions  . Aspirin Other (See Comments)    Pt has Von Willebrands Disease  . Hydrocodone Itching  . Oxycodone Itching  . Tape Dermatitis    Pulls off skin   Social History   Socioeconomic History  . Marital status: Widowed    Spouse name: Not on file  . Number of children: Not on file  . Years of education: Not on file  . Highest education level: Not on file  Occupational History  . Not on file  Social Needs  . Financial resource strain: Not on file  . Food insecurity    Worry: Not on file    Inability: Not on file  . Transportation needs    Medical: Not on file    Non-medical: Not on file  Tobacco Use  . Smoking status: Never Smoker  . Smokeless tobacco: Never Used  Substance and Sexual Activity  . Alcohol use: No  . Drug use: No  . Sexual activity: Not on file    Comment: husband  recently died from myelodysplastic syndrome in January of 2014  Lifestyle  . Physical activity    Days per week: Not on file    Minutes per session: Not on file  . Stress: Not on file  Relationships  . Social connections  Talks on phone: Not on file    Gets together: Not on file    Attends religious service: Not on file    Active member of club or organization: Not on file    Attends meetings of clubs or organizations: Not on file    Relationship status: Not on file  . Intimate partner violence    Fear of current or ex partner: Not on file    Emotionally abused: Not on file    Physically abused: Not on file    Forced sexual activity: Not on file  Other Topics Concern  . Not on file  Social History Narrative  . Not on file   Family History  Problem Relation Age of Onset  . Breast cancer Mother 21       now 18yo  . Stroke Father        Father had no full sisters  . Breast cancer Sister 40       now 82yo; had TAH/BSO  . Lung cancer Brother        died 81yo; smoker   Immunization History  Administered Date(s) Administered  . Influenza Split 01/19/2014  . Influenza Whole 03/07/2009, 03/06/2010  . Influenza,inj,Quad PF,6+ Mos 01/26/2013, 02/17/2015, 01/28/2017, 02/06/2018  . Influenza-Unspecified 03/09/2016  . Pneumococcal Conjugate-13 10/22/2014  . Pneumococcal Polysaccharide-23 09/07/2010, 03/23/2016  . Td 12/25/2007  . Tdap 12/25/2007  . Zoster 03/30/2010  . Zoster Recombinat (Shingrix) 07/23/2017, 10/22/2017   Mammogram is up-to-date. Colonoscopy is up-to-date.  Does not require a Pap smear given the fact she's had a hysterectomy.    Review of Systems  All other systems reviewed and are negative.      Objective:   Physical Exam  Constitutional: She is oriented to person, place, and time. She appears well-developed and well-nourished. No distress.  HENT:  Head: Normocephalic and atraumatic.  Right Ear: External ear normal.  Left Ear: External ear normal.   Nose: Nose normal.  Mouth/Throat: Oropharynx is clear and moist. No oropharyngeal exudate.  Eyes: Pupils are equal, round, and reactive to light. Conjunctivae and EOM are normal. Right eye exhibits no discharge. Left eye exhibits no discharge. No scleral icterus.  Neck: Normal range of motion. Neck supple. No JVD present. No tracheal deviation present. No thyromegaly present.  Cardiovascular: Normal rate, regular rhythm, normal heart sounds and intact distal pulses. Exam reveals no gallop and no friction rub.  No murmur heard. Pulmonary/Chest: Effort normal and breath sounds normal. No stridor. No respiratory distress. She has no wheezes. She has no rales. She exhibits no tenderness.  Abdominal: Soft. Bowel sounds are normal. She exhibits no distension. There is no abdominal tenderness. There is no rebound and no guarding.  Musculoskeletal: Normal range of motion.        General: No tenderness or edema.  Lymphadenopathy:    She has no cervical adenopathy.  Neurological: She is alert and oriented to person, place, and time. She displays normal reflexes. No cranial nerve deficit. She exhibits normal muscle tone. Coordination normal.  Skin: Skin is warm. No rash noted. She is not diaphoretic. No erythema. No pallor.  Psychiatric: She has a normal mood and affect. Her behavior is normal. Judgment and thought content normal.  Vitals reviewed.         Assessment & Plan:  Pure hypercholesterolemia - Plan: Lipid panel  Osteoporosis, unspecified osteoporosis type, unspecified pathological fracture presence  Primary insomnia  Exam today is normal.  CBC and CMP were checked in June and were  normal.  I will check a fasting lipid panel today.  Her goal LDL cholesterol is less than 70.  Patient received her flu shot today.  We will try the patient on trazodone 50 mg p.o. nightly for insomnia.  Regular anticipatory guidance is provided.

## 2019-02-14 LAB — LIPID PANEL
Cholesterol: 168 mg/dL (ref <200)
HDL: 68 mg/dL (ref >=50)
LDL Cholesterol (Calc): 81 mg/dL (calc)
Non-HDL Cholesterol (Calc): 100 mg/dL (calc) (ref <130)
Total CHOL/HDL Ratio: 2.5 (calc) (ref <5.0)
Triglycerides: 97 mg/dL (ref <150)

## 2019-02-17 ENCOUNTER — Encounter: Payer: Self-pay | Admitting: Family Medicine

## 2019-02-24 ENCOUNTER — Other Ambulatory Visit: Payer: Self-pay

## 2019-02-24 DIAGNOSIS — Z85820 Personal history of malignant melanoma of skin: Secondary | ICD-10-CM | POA: Diagnosis not present

## 2019-02-24 DIAGNOSIS — D1801 Hemangioma of skin and subcutaneous tissue: Secondary | ICD-10-CM | POA: Diagnosis not present

## 2019-02-24 DIAGNOSIS — L570 Actinic keratosis: Secondary | ICD-10-CM | POA: Diagnosis not present

## 2019-02-24 DIAGNOSIS — L821 Other seborrheic keratosis: Secondary | ICD-10-CM | POA: Diagnosis not present

## 2019-02-25 MED ORDER — CIMETIDINE 400 MG PO TABS: 400.0000 mg | ORAL_TABLET | Freq: Two times a day (BID) | ORAL | 1 refills | Status: DC

## 2019-03-23 ENCOUNTER — Encounter: Payer: Self-pay | Admitting: Family Medicine

## 2019-03-23 ENCOUNTER — Other Ambulatory Visit: Payer: Self-pay

## 2019-03-23 ENCOUNTER — Ambulatory Visit (INDEPENDENT_AMBULATORY_CARE_PROVIDER_SITE_OTHER): Payer: Medicare Other | Admitting: Family Medicine

## 2019-03-23 VITALS — BP 110/68 | HR 86 | Temp 97.5°F | Resp 14 | Ht 65.5 in | Wt 130.0 lb

## 2019-03-23 DIAGNOSIS — M25562 Pain in left knee: Secondary | ICD-10-CM | POA: Diagnosis not present

## 2019-03-23 NOTE — Progress Notes (Signed)
Subjective:    Patient ID: Caroline Valdez, female    DOB: [birth year], 68 y.o.   MRN: [id number]  HPI  1 week ago, the patient was keeping her granddaughter.  Her granddaughter jumped on her playing.  It caused her left kneecap to pop laterally out of position.  It then popped immediately back.  However for the last week she has had pain directly below the kneecap.  She reports pain with standing and with ambulation.  She has pain with passive range of motion today in the knee.  There is no tenderness to palpation over the medial or lateral joint line.  There is no erythema or warmth or effusion.  She has no laxity to varus or valgus stress.  She denies any instability with gait.  She has a negative anterior and posterior drawer sign.  However she has significant pain with patellar grind.  The pain is underneath the kneecap.  She also has pain anteriorly with Apley grind however she denies any posterior pain. Past Medical History:  Diagnosis Date  . Blood dyscrasia   . Breast cancer ([place])    left  . Complication of anesthesia    1980S  TUBAL LIG, + HEMM SURGERY PT PASSED OUT AND WENT INTO SHOCK.  NO PROBLEMS SINCE   . 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 Endometriosis   . Fibromyalgia   . GERD (gastroesophageal reflux disease)   . Hypercholesteremia   . Mini stroke ([place]) 09/2012  . Radiculopathy   . Stroke [facility])    MINI STROKE  . Von Willebrand disease ([place]) 09/11/2013  . Von Willebrand's disease [facility])    history of   Past Surgical History:  Procedure Laterality Date  . ABDOMINAL HYSTERECTOMY    . BACK SURGERY    . BREAST IMPLANT REMOVAL Right 12/05/2017  . BREAST IMPLANT REMOVAL Right 12/05/2017   Procedure: REMOVAL RIGHT  BREAST IMPLANT MATERIAL;  Surgeon: Crissie Reese, MD;  Location: [place];  Service: Plastics;  Laterality: Right;  . BREAST RECONSTRUCTION    . BREAST RECONSTRUCTION Right 12/05/2017   Procedure: DELAYED BREAST RECONSTRUCTION WITH SILICONE IMPLANT;   Surgeon: Crissie Reese, MD;  Location: [place];  Service: Plastics;  Laterality: Right;  . CARPAL TUNNEL RELEASE     right  . COLONOSCOPY  01/2010   Dr. Aviva Signs: normal  . COLONOSCOPY WITH PROPOFOL N/A 01/22/2019   Procedure: COLONOSCOPY WITH PROPOFOL;  Surgeon: Daneil Dolin, MD;  [facility];  Service: Endoscopy;  Laterality: N/A;  2:30pm - can not come earlier due to transportation  . FINGER SURGERY     LEFT THUMB  . HEMORRHOID SURGERY     X2  . LAMINECTOMY    . MASTECTOMY     left  . SHOULDER SURGERY    . SKIN CANCER EXCISION    . TONSILLECTOMY    . TUBAL LIGATION     Current Outpatient Medications on File Prior to Visit  Medication Sig Dispense Refill  . acetaminophen (TYLENOL) 500 MG tablet Take 1,000 mg by mouth daily as needed for headache.    Marland Kitchen acyclovir (ZOVIRAX) 400 MG tablet Take 1 tablet (400 mg total) by mouth 3 (three) times daily. (Patient taking differently: Take 400 mg by mouth 3 (three) times daily as needed (fever blisters). ) 30 tablet 0  . ALPRAZolam (XANAX) 0.5 MG tablet TAKE 1 TABLET(0.5 MG) BY MOUTH AT BEDTIME AS NEEDED (Patient taking differently: 0.5 mg at bedtime as  needed for sleep. ) 60 tablet 0  . calcium carbonate (OS-CAL) 600 MG TABS Take 600 mg by mouth daily.     . cholecalciferol (VITAMIN D) 1000 UNITS tablet Take 1,000 Units by mouth daily.    . cimetidine (TAGAMET) 400 MG tablet Take 1 tablet (400 mg total) by mouth 2 (two) times daily. 180 tablet 1  . fluticasone (FLONASE) 50 MCG/ACT nasal spray Place 1 spray into both nostrils daily.    Marland Kitchen loratadine (CLARITIN) 10 MG tablet Take 10 mg by mouth daily.    . Multiple Vitamin (MULTIVITAMIN WITH MINERALS) TABS tablet Take 1 tablet by mouth daily.    . Multiple Vitamins-Minerals (EYE VITAMINS PO) Take 1 tablet by mouth daily.     . pantoprazole (PROTONIX) 40 MG tablet Take 1 tablet (40 mg total) by mouth 2 (two) times daily. 180 tablet 3  . rosuvastatin (CRESTOR) 20 MG tablet TAKE 1  TABLET DAILY 90 tablet 3  . traMADol (ULTRAM) 50 MG tablet TAKE 1 TABLET(50 MG) BY MOUTH EVERY 6 HOURS AS NEEDED (Patient taking differently: Take 50 mg by mouth every 6 (six) hours as needed (pain). TAKE 1 TABLET(50 MG) BY MOUTH EVERY 6 HOURS AS NEEDED) 30 tablet 0  . traZODone (DESYREL) 50 MG tablet Take 0.5-1 tablets (25-50 mg total) by mouth at bedtime as needed for sleep. 30 tablet 3  . zinc sulfate 220 (50 Zn) MG capsule Take 220 mg by mouth daily.      Current Facility-Administered Medications on File Prior to Visit  Medication Dose Route Frequency Provider Last Rate Last Dose  . denosumab (PROLIA) injection 60 mg  60 mg Subcutaneous Q6 months Susy Frizzle, MD   60 mg at 08/11/18 1009   Allergies  Allergen Reactions  . Aspirin Other (See Comments)    Pt has Von Willebrands Disease  . Hydrocodone Itching  . Oxycodone Itching  . Tape Dermatitis    Pulls off skin   Social History   Socioeconomic History  . Marital status: Widowed    Spouse name: Not on file  . Number of children: Not on file  . Years of education: Not on file  . Highest education level: Not on file  Occupational History  . Not on file  Social Needs  . Financial resource strain: Not on file  . Food insecurity    Worry: Not on file    Inability: Not on file  . Transportation needs    Medical: Not on file    Non-medical: Not on file  Tobacco Use  . Smoking status: Never Smoker  . Smokeless tobacco: Never Used  Substance and Sexual Activity  . Alcohol use: No  . Drug use: No  . Sexual activity: Not on file    Comment: husband recently died from myelodysplastic syndrome in January of 2014  Lifestyle  . Physical activity    Days per week: Not on file    Minutes per session: Not on file  . Stress: Not on file  Relationships  . Social Herbalist on phone: Not on file    Gets together: Not on file    Attends religious service: Not on file    Active member of club or organization: Not on  file    Attends meetings of clubs or organizations: Not on file    Relationship status: Not on file  . Intimate partner violence    Fear of current or ex partner: Not on file  Emotionally abused: Not on file    Physically abused: Not on file    Forced sexual activity: Not on file  Other Topics Concern  . Not on file  Social History Narrative  . Not on file     Review of Systems  All other systems reviewed and are negative.      Objective:   Physical Exam Vitals signs reviewed.  Constitutional:      Appearance: Normal appearance.  Cardiovascular:     Rate and Rhythm: Normal rate and regular rhythm.     Pulses: Normal pulses.     Heart sounds: Normal heart sounds.  Musculoskeletal:     Left knee: She exhibits decreased range of motion and bony tenderness. She exhibits no swelling, no effusion, no LCL laxity, normal meniscus and no MCL laxity. Tenderness found. Patellar tendon tenderness noted. No medial joint line and no lateral joint line tenderness noted.  Neurological:     Mental Status: She is alert.           Assessment & Plan:  Left knee pain I suspect that the patient may have some underlying cartilage damage on the undersurface of the patella due to the temporary partial dislocation.  Today the remainder of her exam is normal.  Ultimately the patient elects to receive a cortisone shot in her knee.  Using sterile technique, I injected the left knee with 2 cc lidocaine, 2 cc of Marcaine, and 2 cc of 40 mg/mL Kenalog.  The patient tolerated the procedure well without complication.  Recheck if no better in 2 weeks or sooner if worsening

## 2019-04-28 ENCOUNTER — Other Ambulatory Visit: Payer: Self-pay | Admitting: Gastroenterology

## 2019-04-30 ENCOUNTER — Other Ambulatory Visit: Payer: Self-pay | Admitting: Gastroenterology

## 2019-05-25 ENCOUNTER — Other Ambulatory Visit: Payer: Self-pay | Admitting: Family Medicine

## 2019-05-25 DIAGNOSIS — Z0000 Encounter for general adult medical examination without abnormal findings: Secondary | ICD-10-CM

## 2019-05-25 DIAGNOSIS — F4321 Adjustment disorder with depressed mood: Secondary | ICD-10-CM

## 2019-05-25 NOTE — Telephone Encounter (Signed)
Ok to refill??  Last office visit 03/23/2019.  Last refill 11/17/2018.

## 2019-06-05 ENCOUNTER — Encounter: Payer: Self-pay | Admitting: Family Medicine

## 2019-06-11 ENCOUNTER — Ambulatory Visit: Payer: Medicare Other | Attending: Internal Medicine

## 2019-06-11 ENCOUNTER — Other Ambulatory Visit: Payer: Self-pay

## 2019-06-11 DIAGNOSIS — Z20822 Contact with and (suspected) exposure to covid-19: Secondary | ICD-10-CM | POA: Diagnosis not present

## 2019-06-12 LAB — NOVEL CORONAVIRUS, NAA: SARS-CoV-2, NAA: NOT DETECTED

## 2019-06-15 ENCOUNTER — Ambulatory Visit (INDEPENDENT_AMBULATORY_CARE_PROVIDER_SITE_OTHER): Payer: Medicare Other | Admitting: Family Medicine

## 2019-06-15 ENCOUNTER — Ambulatory Visit (HOSPITAL_COMMUNITY)
Admission: RE | Admit: 2019-06-15 | Discharge: 2019-06-15 | Disposition: A | Discharge status: Home / Self Care | Payer: Medicare Other | Source: Ambulatory Visit | Attending: Family Medicine | Admitting: Family Medicine

## 2019-06-15 ENCOUNTER — Encounter: Payer: Self-pay | Admitting: Family Medicine

## 2019-06-15 ENCOUNTER — Other Ambulatory Visit: Payer: Self-pay

## 2019-06-15 VITALS — BP 130/70 | HR 80 | Temp 96.3°F | Resp 16 | Ht 65.5 in | Wt 130.0 lb

## 2019-06-15 DIAGNOSIS — R0789 Other chest pain: Secondary | ICD-10-CM

## 2019-06-15 DIAGNOSIS — R079 Chest pain, unspecified: Secondary | ICD-10-CM | POA: Diagnosis not present

## 2019-06-15 NOTE — Progress Notes (Signed)
Subjective:     Patient ID: Caroline Valdez, female   DOB: 02-Dec-1950, 69 y.o.   MRN: [id number]  HPI Patient is being seen today as a telephone visit.  Phone call began at 815.  Phone call concluded at 827.  At that point I recommended the patient come to the office to be seen.  Patient states she has had a cough off and on for a month.  The cough is mild.  She is also had head congestion and drainage for over a month.  Monday of last week however she started developing tightness in her chest.  The symptoms are there constantly however it seems to worsen with activity.  She states that is mild but is noticeable.  On Thursday she went and had a negative Covid test.  She is also reporting shortness of breath with activity.  She denies any fever chills or body aches.  She denies any nausea or vomiting.  She denies any radiation of the pressure down her arm.  Past medical history significant for a TIA and hyperlipidemia.  Upon arrival to the office, the patient appears relaxed and calm.  She is in no apparent distress.  I am unable to reproduce her chest pain by pressing on her sternum.  Her lungs are completely clear to auscultation bilaterally with no wheezes crackles or rails.  Her heart is in normal sinus rhythm.  EKG today shows normal sinus rhythm with no evidence of ischemia or infarction.  Patient states that the chest tightness that she feels is worse when she coughs or takes a deep breath.  She also has a lot of postnasal drip.  She is constantly cleaning her throat.  She is constantly having to cough drainage up from around her vocal cords. Past Medical History:  Diagnosis Date  . Blood dyscrasia   . Breast cancer ([place])    left  . Complication of anesthesia    1980S  TUBAL LIG, + HEMM SURGERY PT PASSED OUT AND WENT INTO SHOCK.  NO PROBLEMS SINCE   . 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 Endometriosis   . Fibromyalgia   . GERD (gastroesophageal reflux disease)   .  Hypercholesteremia   . Mini stroke ([place]) 09/2012  . Radiculopathy   . Stroke [facility])    MINI STROKE  . Von Willebrand disease ([place]) 09/11/2013  . Von Willebrand's disease [facility])    history of   Past Surgical History:  Procedure Laterality Date  . ABDOMINAL HYSTERECTOMY    . BACK SURGERY    . BREAST IMPLANT REMOVAL Right 12/05/2017  . BREAST IMPLANT REMOVAL Right 12/05/2017   Procedure: REMOVAL RIGHT  BREAST IMPLANT MATERIAL;  Surgeon: Crissie Reese, MD;  Location: [place];  Service: Plastics;  Laterality: Right;  . BREAST RECONSTRUCTION    . BREAST RECONSTRUCTION Right 12/05/2017   Procedure: DELAYED BREAST RECONSTRUCTION WITH SILICONE IMPLANT;  Surgeon: Crissie Reese, MD;  Location: [place];  Service: Plastics;  Laterality: Right;  . CARPAL TUNNEL RELEASE     right  . COLONOSCOPY  01/2010   Dr. Aviva Signs: normal  . COLONOSCOPY WITH PROPOFOL N/A 01/22/2019   Procedure: COLONOSCOPY WITH PROPOFOL;  Surgeon: Daneil Dolin, MD;  [facility];  Service: Endoscopy;  Laterality: N/A;  2:30pm - can not come earlier due to transportation  . FINGER SURGERY     LEFT THUMB  . HEMORRHOID SURGERY     X2  . LAMINECTOMY    .  MASTECTOMY     left  . SHOULDER SURGERY    . SKIN CANCER EXCISION    . TONSILLECTOMY    . TUBAL LIGATION     Current Outpatient Medications on File Prior to Visit  Medication Sig Dispense Refill  . acetaminophen (TYLENOL) 500 MG tablet Take 1,000 mg by mouth daily as needed for headache.    Marland Kitchen acyclovir (ZOVIRAX) 400 MG tablet Take 1 tablet (400 mg total) by mouth 3 (three) times daily. (Patient taking differently: Take 400 mg by mouth 3 (three) times daily as needed (fever blisters). ) 30 tablet 0  . ALPRAZolam (XANAX) 0.5 MG tablet TAKE 1 TABLET(0.5 MG) BY MOUTH AT BEDTIME AS NEEDED 60 tablet 0  . calcium carbonate (OS-CAL) 600 MG TABS Take 600 mg by mouth daily.     . cholecalciferol (VITAMIN D) 1000 UNITS tablet Take 1,000 Units by mouth daily.    . cimetidine  (TAGAMET) 400 MG tablet TAKE 1 TABLET(400 MG) BY MOUTH TWICE DAILY AS NEEDED 60 tablet 2  . fluticasone (FLONASE) 50 MCG/ACT nasal spray Place 1 spray into both nostrils daily.    Marland Kitchen loratadine (CLARITIN) 10 MG tablet Take 10 mg by mouth daily.    . Multiple Vitamin (MULTIVITAMIN WITH MINERALS) TABS tablet Take 1 tablet by mouth daily.    . Multiple Vitamins-Minerals (EYE VITAMINS PO) Take 1 tablet by mouth daily.     . pantoprazole (PROTONIX) 40 MG tablet Take 1 tablet (40 mg total) by mouth 2 (two) times daily. 180 tablet 3  . rosuvastatin (CRESTOR) 20 MG tablet TAKE 1 TABLET DAILY 90 tablet 3  . traMADol (ULTRAM) 50 MG tablet TAKE 1 TABLET(50 MG) BY MOUTH EVERY 6 HOURS AS NEEDED (Patient taking differently: Take 50 mg by mouth every 6 (six) hours as needed (pain). TAKE 1 TABLET(50 MG) BY MOUTH EVERY 6 HOURS AS NEEDED) 30 tablet 0  . traZODone (DESYREL) 50 MG tablet Take 0.5-1 tablets (25-50 mg total) by mouth at bedtime as needed for sleep. 30 tablet 3  . zinc sulfate 220 (50 Zn) MG capsule Take 220 mg by mouth daily.      Current Facility-Administered Medications on File Prior to Visit  Medication Dose Route Frequency Provider Last Rate Last Admin  . denosumab (PROLIA) injection 60 mg  60 mg Subcutaneous Q6 months Susy Frizzle, MD   60 mg at 08/11/18 1009   Allergies  Allergen Reactions  . Aspirin Other (See Comments)    Pt has Von Willebrands Disease  . Hydrocodone Itching  . Oxycodone Itching  . Tape Dermatitis    Pulls off skin   Social History   Socioeconomic History  . Marital status: Widowed    Spouse name: Not on file  . Number of children: Not on file  . Years of education: Not on file  . Highest education level: Not on file  Occupational History  . Not on file  Tobacco Use  . Smoking status: Never Smoker  . Smokeless tobacco: Never Used  Substance and Sexual Activity  . Alcohol use: No  . Drug use: No  . Sexual activity: Not on file    Comment: husband  recently died from myelodysplastic syndrome in January of 2014  Other Topics Concern  . Not on file  Social History Narrative  . Not on file   Social Determinants of Health   Financial Resource Strain:   . Difficulty of Paying Living Expenses: Not on file  Food Insecurity:   .  Worried About Charity fundraiser in the Last Year: Not on file  . Ran Out of Food in the Last Year: Not on file  Transportation Needs:   . Lack of Transportation (Medical): Not on file  . Lack of Transportation (Non-Medical): Not on file  Physical Activity:   . Days of Exercise per Week: Not on file  . Minutes of Exercise per Session: Not on file  Stress:   . Feeling of Stress : Not on file  Social Connections:   . Frequency of Communication with Friends and Family: Not on file  . Frequency of Social Gatherings with Friends and Family: Not on file  . Attends Religious Services: Not on file  . Active Member of Clubs or Organizations: Not on file  . Attends Archivist Meetings: Not on file  . Marital Status: Not on file  Intimate Partner Violence:   . Fear of Current or Ex-Partner: Not on file  . Emotionally Abused: Not on file  . Physically Abused: Not on file  . Sexually Abused: Not on file   Review of Systems  Constitutional: Negative for chills, diaphoresis, fever and malaise/fatigue.  HENT: Positive for congestion and nosebleeds.   Respiratory: Positive for cough and shortness of breath. Negative for wheezing.   Cardiovascular: Positive for chest pain.  Gastrointestinal: Negative for nausea and vomiting.  Skin: Negative for rash.     Review of Systems  Constitutional: Negative for chills, diaphoresis, fever and malaise/fatigue.  HENT: Positive for congestion and nosebleeds.   Respiratory: Positive for cough and shortness of breath. Negative for wheezing.   Cardiovascular: Positive for chest pain.  Gastrointestinal: Negative for nausea and vomiting.  Skin: Negative for rash.        Objective:   Physical Exam Vitals reviewed.  Constitutional:      General: She is not in acute distress.    Appearance: She is normal weight. She is not ill-appearing or toxic-appearing.  Cardiovascular:     Rate and Rhythm: Normal rate and regular rhythm.     Pulses: Normal pulses.     Heart sounds: Normal heart sounds. No murmur. No friction rub. No gallop.   Pulmonary:     Effort: Pulmonary effort is normal. No respiratory distress.     Breath sounds: Normal breath sounds. No stridor. No wheezing, rhonchi or rales.  Abdominal:     General: Abdomen is flat. Bowel sounds are normal. There is no distension.     Palpations: Abdomen is soft.     Tenderness: There is no abdominal tenderness. There is no guarding.  Musculoskeletal:     Right lower leg: No edema.     Left lower leg: No edema.  Neurological:     Mental Status: She is alert.        Assessment:     Chest tightness - Plan: EKG 12-Lead      Plan:     EKG today looks normal.  I do not feel that this is cardiovascular in nature.  Obtain a D-dimer to rule out PE.  Check chest x-ray to rule out pneumonia although her lungs sound clear.  If D-dimer and chest x-ray are clear, I have recommended Coricidin HBP for postnasal drip which could be contributing some to the tightness in her throat and chest.  May also consider adding Carafate for possible acid reflux which could be contributing to the tightness in her chest and also the feeling of drainage and mucus around her vocal cords.

## 2019-06-16 LAB — D-DIMER, QUANTITATIVE: D-Dimer, Quant: 0.23 mcg/mL FEU (ref <0.50)

## 2019-06-29 ENCOUNTER — Other Ambulatory Visit (HOSPITAL_COMMUNITY): Payer: Self-pay | Admitting: Family Medicine

## 2019-06-29 DIAGNOSIS — Z1231 Encounter for screening mammogram for malignant neoplasm of breast: Secondary | ICD-10-CM

## 2019-07-09 IMAGING — MG DIGITAL SCREENING UNILATERAL RIGHT MAMMOGRAM WITH IMPLANTS, CAD
4 series · 4 of 4 positions shown · non-contrast
Comparison: Previous exam(s).

CLINICAL DATA: Screening. History of left mastectomy.

EXAM:
DIGITAL SCREENING UNILATERAL RIGHT MAMMOGRAM WITH IMPLANTS, CAD AND
TOMO
The patient has a retropectoral breast implant. Standard and implant
displaced views were performed.

[R MLO (1 of 2)]
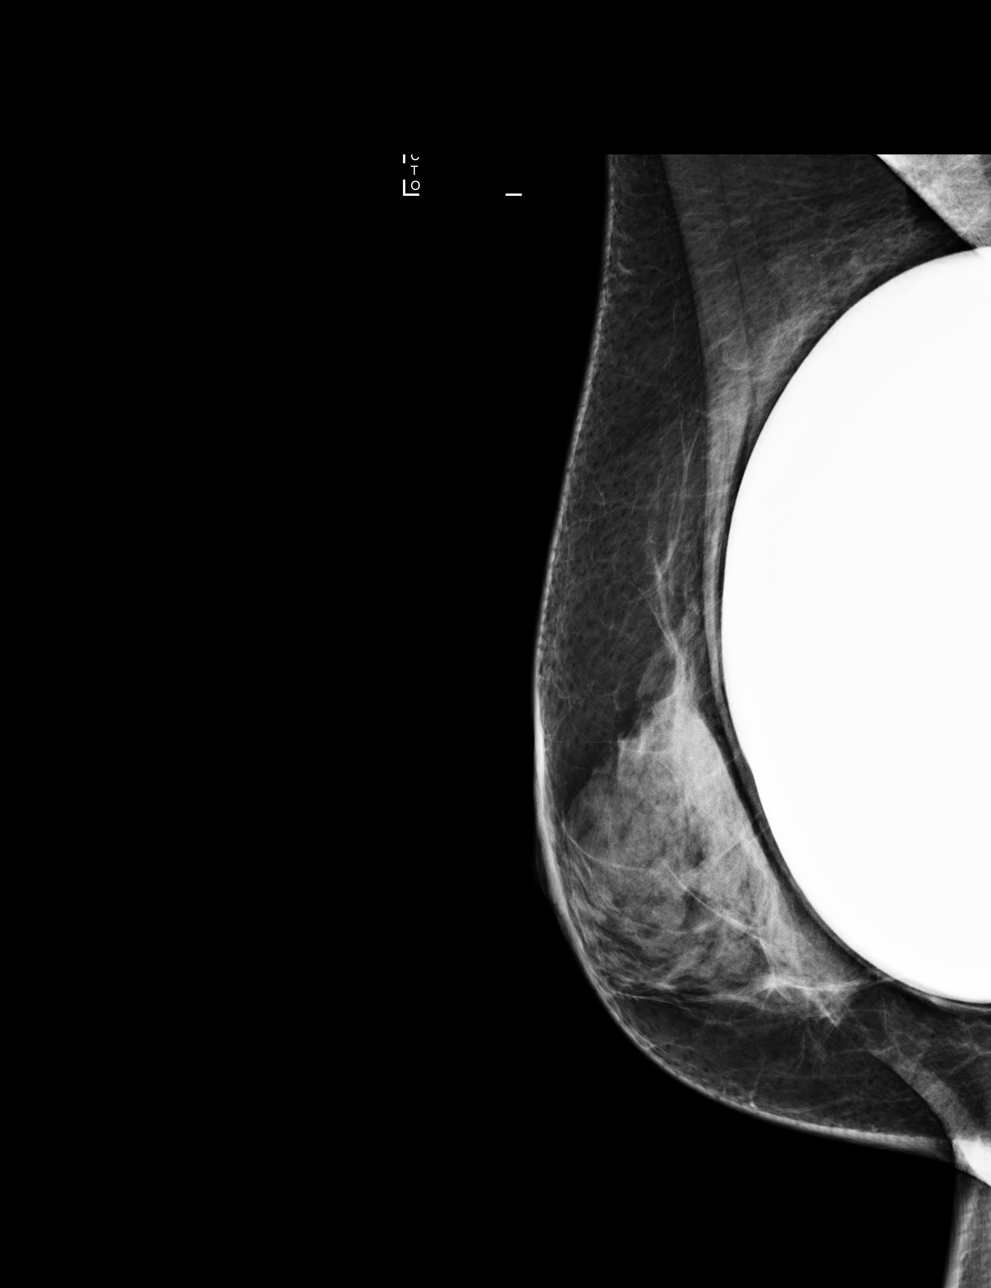

[R CC (1 of 2)]
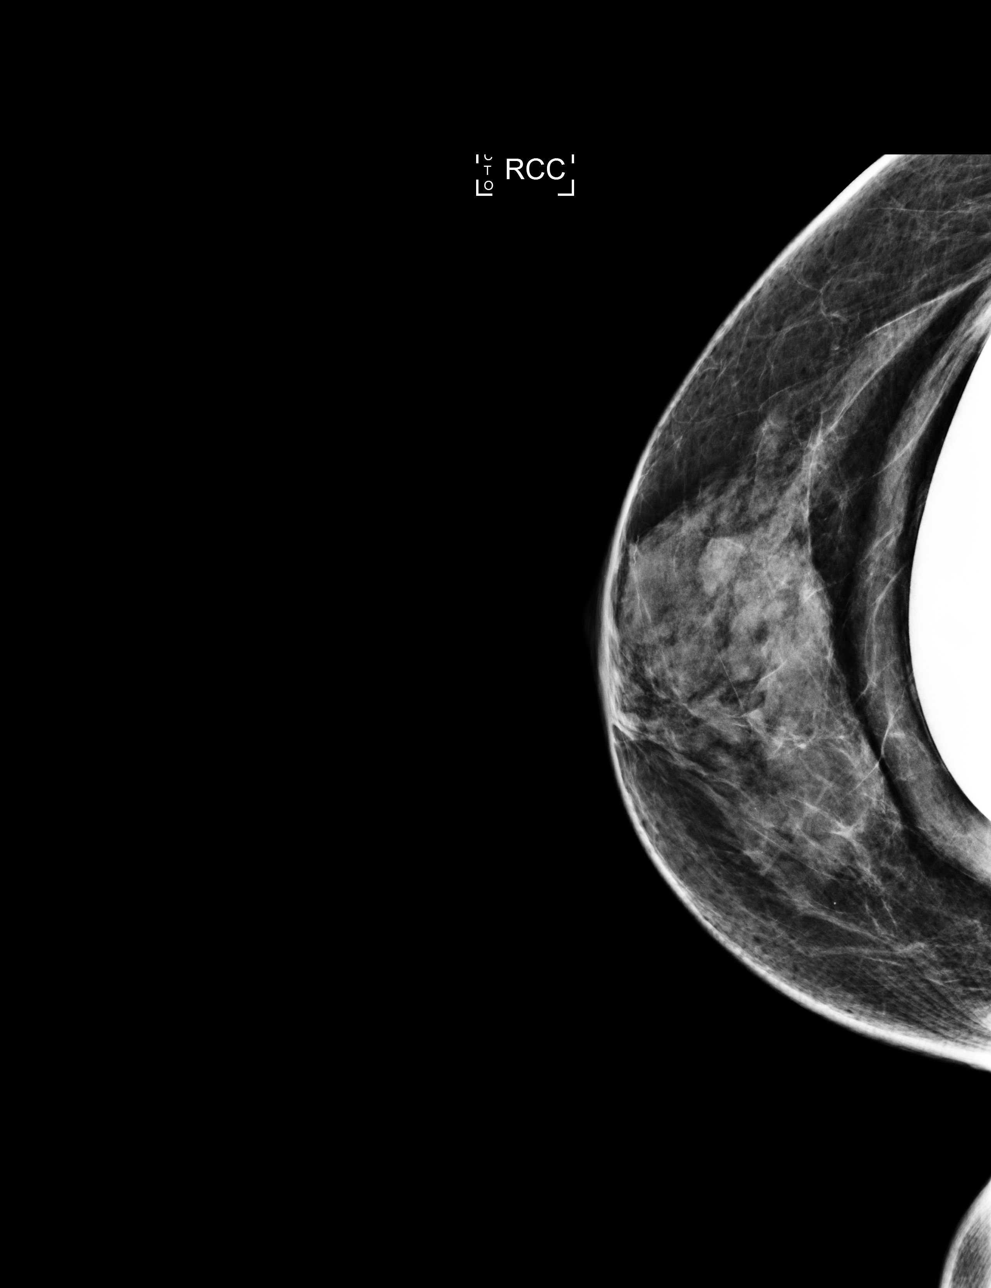

[R MLO (2 of 2)]
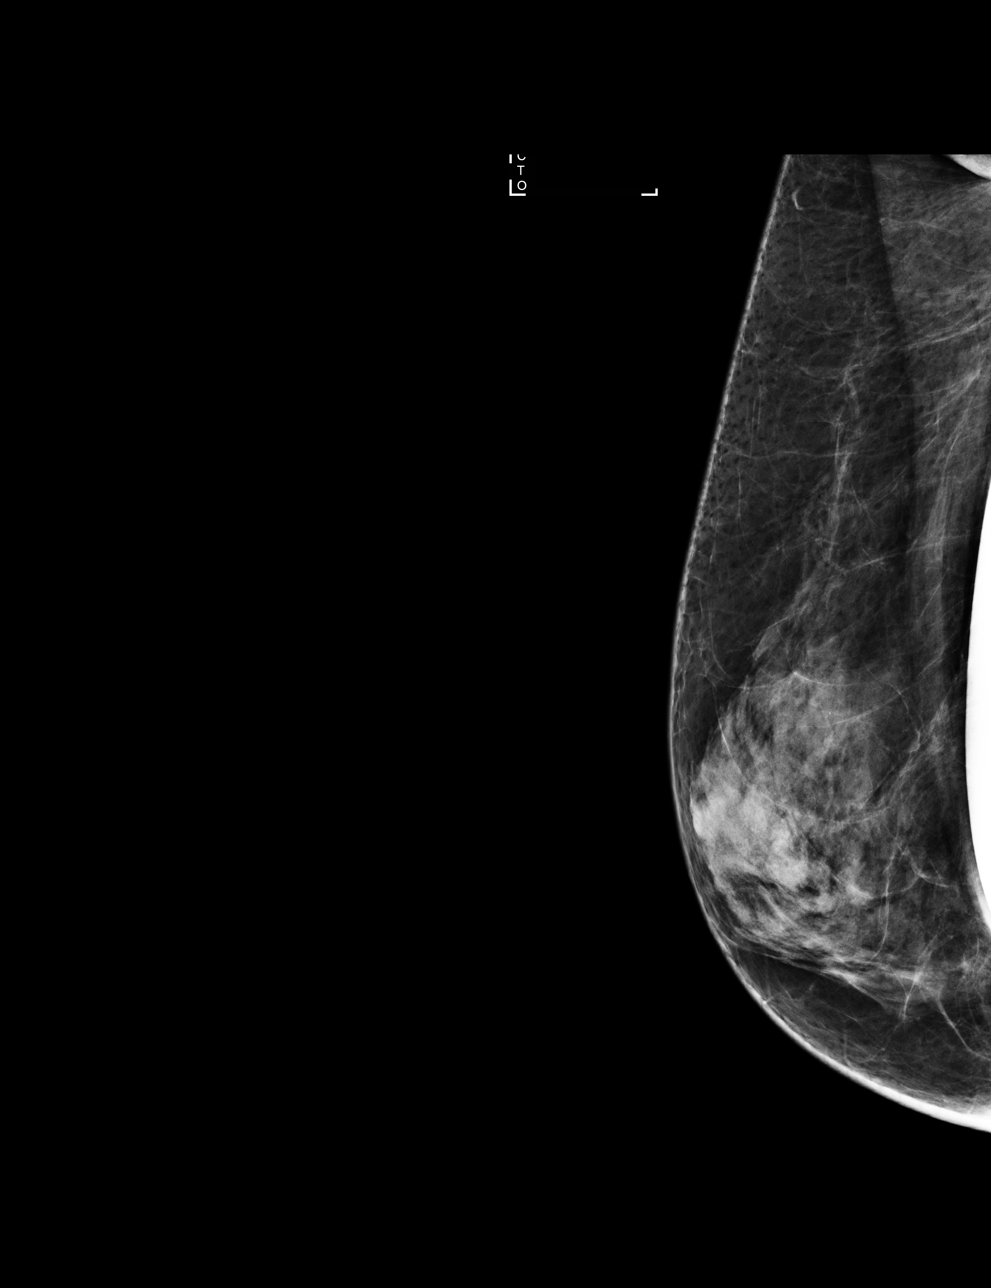

[R CC (2 of 2)]
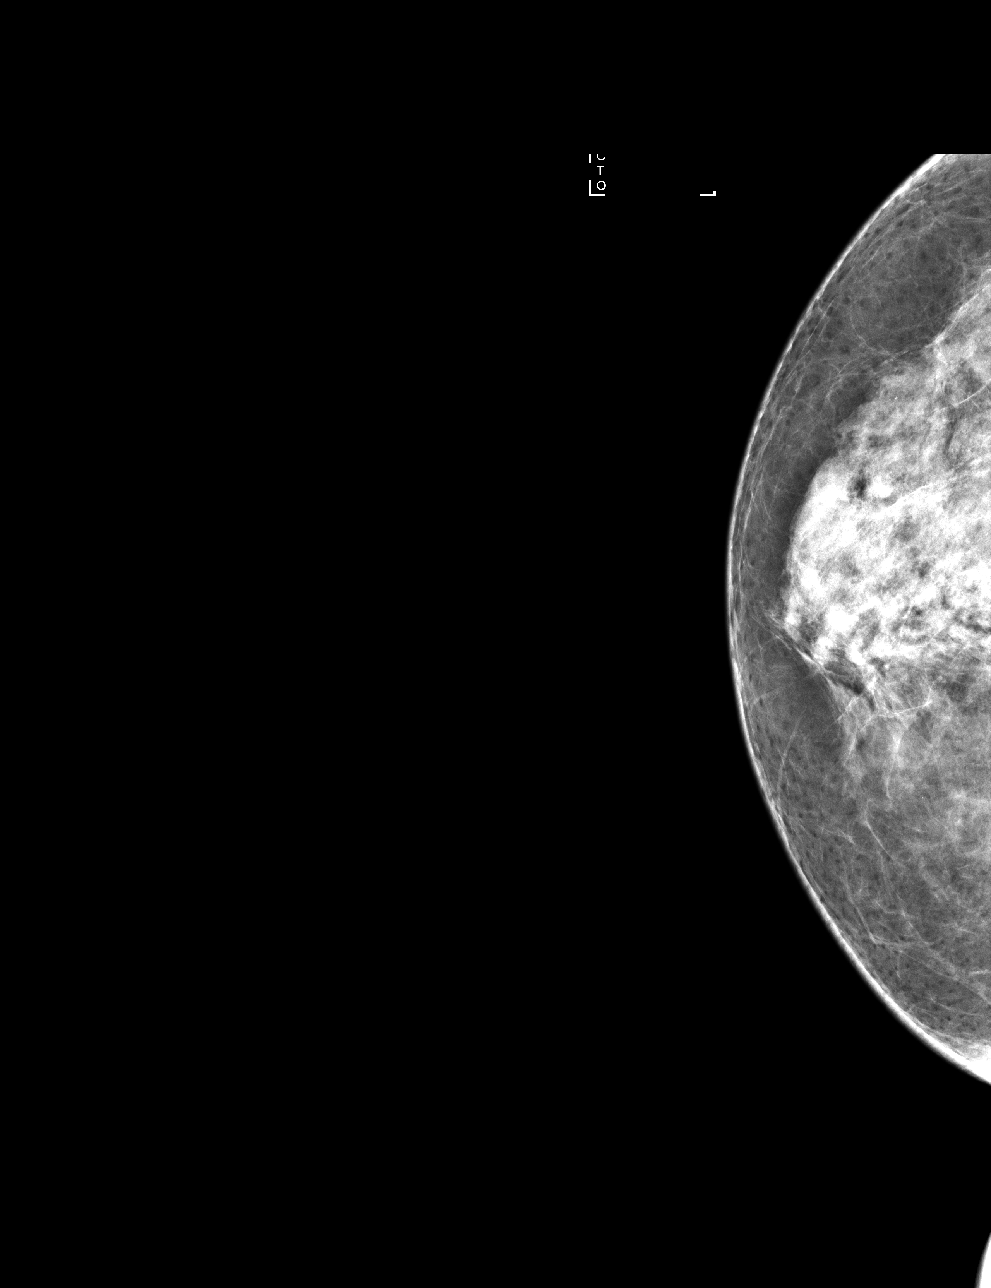

[4 of 4 positions shown; findings below may reference images not displayed]

ACR Breast Density Category c: The breast tissue is heterogeneously
dense, which may obscure small masses.
FINDINGS: In the right breast, a possible mass, seen only in the CC
projection, warrants further evaluation. The patient has had a left
mastectomy. Images were processed with CAD.
IMPRESSION: Further evaluation is suggested for possible mass in the right
breast.

RECOMMENDATION:
3D diagnostic mammogram and possibly ultrasound of the right breast.
(Code:Q0-X-DDO)

The patient will be contacted regarding the findings, and additional
imaging will be scheduled.

BI-RADS CATEGORY  0: Incomplete. Need additional imaging evaluation
and/or prior mammograms for comparison.

## 2019-07-28 ENCOUNTER — Ambulatory Visit (INDEPENDENT_AMBULATORY_CARE_PROVIDER_SITE_OTHER): Payer: Medicare Other | Admitting: Family Medicine

## 2019-07-28 ENCOUNTER — Other Ambulatory Visit: Payer: Self-pay

## 2019-07-28 VITALS — BP 110/78 | HR 83 | Temp 98.1°F | Resp 18 | Ht 65.0 in | Wt 130.0 lb

## 2019-07-28 DIAGNOSIS — R0781 Pleurodynia: Secondary | ICD-10-CM

## 2019-07-28 MED ORDER — HYDROMORPHONE HCL 2 MG PO TABS: 2.0000 mg | ORAL_TABLET | ORAL | 0 refills | Status: DC | PRN

## 2019-07-28 NOTE — Progress Notes (Signed)
Subjective:    Patient ID: Caroline Valdez, female    DOB: 1950-09-05, 69 y.o.   MRN: [id number]  HPI About 2 weeks ago, the patient was reaching awkwardly to get something out of her car when she felt a pop in her left.  The pain is located below her left breast and towards her mid axillary line.  She is tender to palpation over the body of the rib in that area.  There is no palpable deformity however she does report pain that keeps her awake at night.  Her extremities squeezing her ribs at that area.  She denies any shortness of breath.  She denies any hemoptysis.  She denies any severe pleurisy.  She does have pain with coughing or with movement.  She is unable to tolerate tramadol because it keeps her awake.  Hydrocodone and oxycodone cause itching and keep her awake.  She has tolerated Dilaudid in the past.  Tylenol is giving her no relief of pain. Past Medical History:  Diagnosis Date  . Blood dyscrasia   . Breast cancer ([place])    left  . Complication of anesthesia    1980S  TUBAL LIG, + HEMM SURGERY PT PASSED OUT AND WENT INTO SHOCK.  NO PROBLEMS SINCE   . 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 Endometriosis   . Fibromyalgia   . GERD (gastroesophageal reflux disease)   . Hypercholesteremia   . Mini stroke ([place]) 09/2012  . Radiculopathy   . Stroke [facility])    MINI STROKE  . Von Willebrand disease ([place]) 09/11/2013  . Von Willebrand's disease [facility])    history of   Past Surgical History:  Procedure Laterality Date  . ABDOMINAL HYSTERECTOMY    . BACK SURGERY    . BREAST IMPLANT REMOVAL Right 12/05/2017  . BREAST IMPLANT REMOVAL Right 12/05/2017   Procedure: REMOVAL RIGHT  BREAST IMPLANT MATERIAL;  Surgeon: Crissie Reese, MD;  Location: [place];  Service: Plastics;  Laterality: Right;  . BREAST RECONSTRUCTION    . BREAST RECONSTRUCTION Right 12/05/2017   Procedure: DELAYED BREAST RECONSTRUCTION WITH SILICONE IMPLANT;  Surgeon: Crissie Reese, MD;  Location: [place];  Service:  Plastics;  Laterality: Right;  . CARPAL TUNNEL RELEASE     right  . COLONOSCOPY  01/2010   Dr. Aviva Signs: normal  . COLONOSCOPY WITH PROPOFOL N/A 01/22/2019   Procedure: COLONOSCOPY WITH PROPOFOL;  Surgeon: Daneil Dolin, MD;  [facility];  Service: Endoscopy;  Laterality: N/A;  2:30pm - can not come earlier due to transportation  . FINGER SURGERY     LEFT THUMB  . HEMORRHOID SURGERY     X2  . LAMINECTOMY    . MASTECTOMY     left  . SHOULDER SURGERY    . SKIN CANCER EXCISION    . TONSILLECTOMY    . TUBAL LIGATION     Current Outpatient Medications on File Prior to Visit  Medication Sig Dispense Refill  . acetaminophen (TYLENOL) 500 MG tablet Take 1,000 mg by mouth daily as needed for headache.    Marland Kitchen acyclovir (ZOVIRAX) 400 MG tablet Take 1 tablet (400 mg total) by mouth 3 (three) times daily. (Patient taking differently: Take 400 mg by mouth 3 (three) times daily as needed (fever blisters). ) 30 tablet 0  . ALPRAZolam (XANAX) 0.5 MG tablet TAKE 1 TABLET(0.5 MG) BY MOUTH AT BEDTIME AS NEEDED 60 tablet 0  . calcium carbonate (OS-CAL) 600 MG TABS Take 600  mg by mouth daily.     . cholecalciferol (VITAMIN D) 1000 UNITS tablet Take 1,000 Units by mouth daily.    . cimetidine (TAGAMET) 400 MG tablet TAKE 1 TABLET(400 MG) BY MOUTH TWICE DAILY AS NEEDED 60 tablet 2  . fluticasone (FLONASE) 50 MCG/ACT nasal spray Place 1 spray into both nostrils daily.    Marland Kitchen glucosamine-chondroitin 500-400 MG tablet Take 1 tablet by mouth 3 (three) times daily.    Marland Kitchen loratadine (CLARITIN) 10 MG tablet Take 10 mg by mouth daily.    . Multiple Vitamin (MULTIVITAMIN WITH MINERALS) TABS tablet Take 1 tablet by mouth daily.    . Multiple Vitamins-Minerals (EYE VITAMINS PO) Take 1 tablet by mouth daily.     . pantoprazole (PROTONIX) 40 MG tablet Take 1 tablet (40 mg total) by mouth 2 (two) times daily. 180 tablet 3  . rosuvastatin (CRESTOR) 20 MG tablet TAKE 1 TABLET DAILY 90 tablet 3  . zinc sulfate  220 (50 Zn) MG capsule Take 220 mg by mouth daily.      Current Facility-Administered Medications on File Prior to Visit  Medication Dose Route Frequency Provider Last Rate Last Admin  . denosumab (PROLIA) injection 60 mg  60 mg Subcutaneous Q6 months Susy Frizzle, MD   60 mg at 08/11/18 1009   Allergies  Allergen Reactions  . Aspirin Other (See Comments)    Pt has Von Willebrands Disease  . Hydrocodone Itching  . Oxycodone Itching  . Tape Dermatitis    Pulls off skin   Social History   Socioeconomic History  . Marital status: Widowed    Spouse name: Not on file  . Number of children: Not on file  . Years of education: Not on file  . Highest education level: Not on file  Occupational History  . Not on file  Tobacco Use  . Smoking status: Never Smoker  . Smokeless tobacco: Never Used  Substance and Sexual Activity  . Alcohol use: No  . Drug use: No  . Sexual activity: Not on file    Comment: husband recently died from myelodysplastic syndrome in January of 2014  Other Topics Concern  . Not on file  Social History Narrative  . Not on file   Social Determinants of Health   Financial Resource Strain:   . Difficulty of Paying Living Expenses: Not on file  Food Insecurity:   . Worried About Charity fundraiser in the Last Year: Not on file  . Ran Out of Food in the Last Year: Not on file  Transportation Needs:   . Lack of Transportation (Medical): Not on file  . Lack of Transportation (Non-Medical): Not on file  Physical Activity:   . Days of Exercise per Week: Not on file  . Minutes of Exercise per Session: Not on file  Stress:   . Feeling of Stress : Not on file  Social Connections:   . Frequency of Communication with Friends and Family: Not on file  . Frequency of Social Gatherings with Friends and Family: Not on file  . Attends Religious Services: Not on file  . Active Member of Clubs or Organizations: Not on file  . Attends Archivist Meetings:  Not on file  . Marital Status: Not on file  Intimate Partner Violence:   . Fear of Current or Ex-Partner: Not on file  . Emotionally Abused: Not on file  . Physically Abused: Not on file  . Sexually Abused: Not on file  Review of Systems  All other systems reviewed and are negative.      Objective:   Physical Exam Vitals reviewed.  Constitutional:      General: She is not in acute distress.    Appearance: Normal appearance. She is normal weight. She is not ill-appearing or toxic-appearing.  Cardiovascular:     Rate and Rhythm: Normal rate and regular rhythm.     Heart sounds: Normal heart sounds.  Pulmonary:     Effort: Pulmonary effort is normal. No respiratory distress.     Breath sounds: Normal breath sounds. No stridor. No wheezing, rhonchi or rales.  Chest:     Chest wall: Tenderness present. No mass, deformity or edema. There is no dullness to percussion.    Abdominal:     General: Abdomen is flat. Bowel sounds are normal. There is no distension.     Palpations: Abdomen is soft.     Tenderness: There is no abdominal tenderness.  Neurological:     Mental Status: She is alert.           Assessment & Plan:  Rib pain on left side  Patient may have cracked a rib in the area indicated above.  I would anticipate gradual improvement in the pain over the next 2 to 3 weeks.  I have recommended symptom management with Dilaudid 1/2 tablet every 4-6 hours as needed for pain given that she can tolerate this medication.  Cautioned the patient about sedation and falls and confusion.  If pain is not improving over the next 2 weeks or if the pain intensifies I will to get an x-ray of the ribs in that area however she just had a normal chest x-ray in January that showed no pathologic lesions in the ribs.  Patient is comfortable with this plan and will notify me if pain worsens but is slowly getting better.

## 2019-07-31 ENCOUNTER — Other Ambulatory Visit: Payer: Self-pay | Admitting: Gastroenterology

## 2019-08-10 ENCOUNTER — Other Ambulatory Visit (HOSPITAL_COMMUNITY): Payer: Self-pay | Admitting: Family Medicine

## 2019-08-10 ENCOUNTER — Ambulatory Visit (HOSPITAL_COMMUNITY)
Admission: RE | Admit: 2019-08-10 | Discharge: 2019-08-10 | Disposition: A | Discharge status: Home / Self Care | Payer: Medicare Other | Source: Ambulatory Visit | Attending: Family Medicine | Admitting: Family Medicine

## 2019-08-10 ENCOUNTER — Other Ambulatory Visit: Payer: Self-pay

## 2019-08-10 DIAGNOSIS — Z1231 Encounter for screening mammogram for malignant neoplasm of breast: Secondary | ICD-10-CM | POA: Diagnosis not present

## 2019-08-14 ENCOUNTER — Ambulatory Visit (HOSPITAL_COMMUNITY)
Admission: RE | Admit: 2019-08-14 | Discharge: 2019-08-14 | Disposition: A | Discharge status: Home / Self Care | Payer: Medicare Other | Source: Ambulatory Visit | Attending: Family Medicine | Admitting: Family Medicine

## 2019-08-14 ENCOUNTER — Encounter: Payer: Self-pay | Admitting: Family Medicine

## 2019-08-14 ENCOUNTER — Ambulatory Visit (INDEPENDENT_AMBULATORY_CARE_PROVIDER_SITE_OTHER): Payer: Medicare Other | Admitting: Family Medicine

## 2019-08-14 ENCOUNTER — Other Ambulatory Visit: Payer: Self-pay

## 2019-08-14 VITALS — BP 126/62 | HR 68 | Temp 96.1°F | Resp 14 | Ht 65.5 in | Wt 129.0 lb

## 2019-08-14 DIAGNOSIS — Z8673 Personal history of transient ischemic attack (TIA), and cerebral infarction without residual deficits: Secondary | ICD-10-CM

## 2019-08-14 DIAGNOSIS — Z0000 Encounter for general adult medical examination without abnormal findings: Secondary | ICD-10-CM

## 2019-08-14 DIAGNOSIS — R0781 Pleurodynia: Secondary | ICD-10-CM

## 2019-08-14 DIAGNOSIS — Z0001 Encounter for general adult medical examination with abnormal findings: Secondary | ICD-10-CM

## 2019-08-14 DIAGNOSIS — D6800 Von Willebrand disease, unspecified: Secondary | ICD-10-CM

## 2019-08-14 DIAGNOSIS — M810 Age-related osteoporosis without current pathological fracture: Secondary | ICD-10-CM | POA: Diagnosis not present

## 2019-08-14 DIAGNOSIS — E7800 Pure hypercholesterolemia, unspecified: Secondary | ICD-10-CM | POA: Diagnosis not present

## 2019-08-14 DIAGNOSIS — S299XXA Unspecified injury of thorax, initial encounter: Secondary | ICD-10-CM | POA: Diagnosis not present

## 2019-08-14 MED ORDER — DENOSUMAB 60 MG/ML ~~LOC~~ SOSY
60.0000 mg | PREFILLED_SYRINGE | Freq: Once | SUBCUTANEOUS | Status: AC
  Administered 2019-08-14: 60 mg via SUBCUTANEOUS

## 2019-08-14 NOTE — Progress Notes (Signed)
Subjective:    Patient ID: Caroline Valdez, female    DOB: 1950/10/05, 69 y.o.   MRN: [id number]  HPI Patient is a very pleasant 69 year old white female who has a history of breast cancer in the left breast and also a history of a TIA who presents today for CPE.  Patient had a colonoscopy in September 2020 which was normal.  Therefore she would not be due again until age 69.  Therefore she should not require a repeat colonoscopy.  Her mammogram was just recently performed and is completely normal.  She is due to repeat a bone density test.  She does not require Pap smear due to her history of a hysterectomy.  She denies any depression, memory loss, or falls.  She is continuing to have rib pain on her left side just below the margin of her left rib.  It burns and throbs and aches underneath the rib from just below her breast all the way to her back. Past Medical History:  Diagnosis Date  . Blood dyscrasia   . Breast cancer ([place])    left  . Complication of anesthesia    1980S  TUBAL LIG, + HEMM SURGERY PT PASSED OUT AND WENT INTO SHOCK.  NO PROBLEMS SINCE   . 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 Endometriosis   . Fibromyalgia   . GERD (gastroesophageal reflux disease)   . Hypercholesteremia   . Mini stroke ([place]) 09/2012  . Radiculopathy   . Stroke [facility])    MINI STROKE  . Von Willebrand disease ([place]) 09/11/2013  . Von Willebrand's disease [facility])    history of   Past Surgical History:  Procedure Laterality Date  . ABDOMINAL HYSTERECTOMY    . BACK SURGERY    . BREAST IMPLANT REMOVAL Right 12/05/2017  . BREAST IMPLANT REMOVAL Right 12/05/2017   Procedure: REMOVAL RIGHT  BREAST IMPLANT MATERIAL;  Surgeon: Crissie Reese, MD;  Location: [place];  Service: Plastics;  Laterality: Right;  . BREAST RECONSTRUCTION    . BREAST RECONSTRUCTION Right 12/05/2017   Procedure: DELAYED BREAST RECONSTRUCTION WITH SILICONE IMPLANT;  Surgeon: Crissie Reese, MD;  Location: [place];  Service:  Plastics;  Laterality: Right;  . CARPAL TUNNEL RELEASE     right  . COLONOSCOPY  01/2010   Dr. Aviva Signs: normal  . COLONOSCOPY WITH PROPOFOL N/A 01/22/2019   Procedure: COLONOSCOPY WITH PROPOFOL;  Surgeon: Daneil Dolin, MD;  [facility];  Service: Endoscopy;  Laterality: N/A;  2:30pm - can not come earlier due to transportation  . FINGER SURGERY     LEFT THUMB  . HEMORRHOID SURGERY     X2  . LAMINECTOMY    . MASTECTOMY     left  . SHOULDER SURGERY    . SKIN CANCER EXCISION    . TONSILLECTOMY    . TUBAL LIGATION     Current Outpatient Medications on File Prior to Visit  Medication Sig Dispense Refill  . acetaminophen (TYLENOL) 500 MG tablet Take 1,000 mg by mouth daily as needed for headache.    Marland Kitchen acyclovir (ZOVIRAX) 400 MG tablet Take 1 tablet (400 mg total) by mouth 3 (three) times daily. (Patient taking differently: Take 400 mg by mouth 3 (three) times daily as needed (fever blisters). ) 30 tablet 0  . ALPRAZolam (XANAX) 0.5 MG tablet TAKE 1 TABLET(0.5 MG) BY MOUTH AT BEDTIME AS NEEDED 60 tablet 0  . calcium carbonate (OS-CAL) 600 MG TABS Take 600  mg by mouth daily.     . cholecalciferol (VITAMIN D) 1000 UNITS tablet Take 1,000 Units by mouth daily.    . cimetidine (TAGAMET) 400 MG tablet TAKE 1 TABLET(400 MG) BY MOUTH TWICE DAILY AS NEEDED 60 tablet 2  . fluticasone (FLONASE) 50 MCG/ACT nasal spray Place 1 spray into both nostrils daily.    Marland Kitchen glucosamine-chondroitin 500-400 MG tablet Take 1 tablet by mouth 3 (three) times daily.    Marland Kitchen HYDROmorphone (DILAUDID) 2 MG tablet Take 1 tablet (2 mg total) by mouth every 4 (four) hours as needed for severe pain. 30 tablet 0  . loratadine (CLARITIN) 10 MG tablet Take 10 mg by mouth daily.    . Multiple Vitamin (MULTIVITAMIN WITH MINERALS) TABS tablet Take 1 tablet by mouth daily.    . Multiple Vitamins-Minerals (EYE VITAMINS PO) Take 1 tablet by mouth daily.     . pantoprazole (PROTONIX) 40 MG tablet Take 1 tablet (40 mg  total) by mouth 2 (two) times daily. 180 tablet 3  . rosuvastatin (CRESTOR) 20 MG tablet TAKE 1 TABLET DAILY 90 tablet 3  . zinc sulfate 220 (50 Zn) MG capsule Take 220 mg by mouth daily.      Current Facility-Administered Medications on File Prior to Visit  Medication Dose Route Frequency Provider Last Rate Last Admin  . denosumab (PROLIA) injection 60 mg  60 mg Subcutaneous Q6 months Susy Frizzle, MD   60 mg at 08/11/18 1009   Allergies  Allergen Reactions  . Aspirin Other (See Comments)    Pt has Von Willebrands Disease  . Hydrocodone Itching  . Oxycodone Itching  . Tape Dermatitis    Pulls off skin   Social History   Socioeconomic History  . Marital status: Widowed    Spouse name: Not on file  . Number of children: Not on file  . Years of education: Not on file  . Highest education level: Not on file  Occupational History  . Not on file  Tobacco Use  . Smoking status: Never Smoker  . Smokeless tobacco: Never Used  Substance and Sexual Activity  . Alcohol use: No  . Drug use: No  . Sexual activity: Not on file    Comment: husband recently died from myelodysplastic syndrome in January of 2014  Other Topics Concern  . Not on file  Social History Narrative  . Not on file   Social Determinants of Health   Financial Resource Strain:   . Difficulty of Paying Living Expenses:   Food Insecurity:   . Worried About Charity fundraiser in the Last Year:   . Arboriculturist in the Last Year:   Transportation Needs:   . Film/video editor (Medical):   Marland Kitchen Lack of Transportation (Non-Medical):   Physical Activity:   . Days of Exercise per Week:   . Minutes of Exercise per Session:   Stress:   . Feeling of Stress :   Social Connections:   . Frequency of Communication with Friends and Family:   . Frequency of Social Gatherings with Friends and Family:   . Attends Religious Services:   . Active Member of Clubs or Organizations:   . Attends Archivist  Meetings:   Marland Kitchen Marital Status:   Intimate Partner Violence:   . Fear of Current or Ex-Partner:   . Emotionally Abused:   Marland Kitchen Physically Abused:   . Sexually Abused:    Family History  Problem Relation Age of Onset  .  Breast cancer Mother 23       now 5yo  . Stroke Father        Father had no full sisters  . Breast cancer Sister 66       now 69yo; had TAH/BSO  . Lung cancer Brother        died 11yo; smoker   Immunization History  Administered Date(s) Administered  . Fluad Quad(high Dose 65+) 02/13/2019  . Influenza Split 01/19/2014  . Influenza Whole 03/07/2009, 03/06/2010  . Influenza,inj,Quad PF,6+ Mos 01/26/2013, 02/17/2015, 01/28/2017, 02/06/2018  . Influenza-Unspecified 03/09/2016  . PFIZER SARS-COV-2 Vaccination 06/25/2019, 07/16/2019  . Pneumococcal Conjugate-13 10/22/2014  . Pneumococcal Polysaccharide-23 09/07/2010, 03/23/2016  . Td 12/25/2007  . Tdap 12/25/2007  . Zoster 03/30/2010  . Zoster Recombinat (Shingrix) 07/23/2017, 10/22/2017   Mammogram is up-to-date. Colonoscopy is up-to-date.  Does not require a Pap smear given the fact she's had a hysterectomy.    Review of Systems  All other systems reviewed and are negative.      Objective:   Physical Exam  Constitutional: She is oriented to person, place, and time. She appears well-developed and well-nourished. No distress.  HENT:  Head: Normocephalic and atraumatic.  Right Ear: External ear normal.  Left Ear: External ear normal.  Nose: Nose normal.  Mouth/Throat: Oropharynx is clear and moist. No oropharyngeal exudate.  Eyes: Pupils are equal, round, and reactive to light. Conjunctivae and EOM are normal. Right eye exhibits no discharge. Left eye exhibits no discharge. No scleral icterus.  Neck: No JVD present. No tracheal deviation present. No thyromegaly present.  Cardiovascular: Normal rate, regular rhythm, normal heart sounds and intact distal pulses. Exam reveals no gallop and no friction rub.  No  murmur heard. Pulmonary/Chest: Effort normal and breath sounds normal. No stridor. No respiratory distress. She has no wheezes. She has no rales. She exhibits no tenderness.  Abdominal: Soft. Bowel sounds are normal. She exhibits no distension. There is no abdominal tenderness. There is no rebound and no guarding.  Musculoskeletal:        General: No tenderness or edema. Normal range of motion.     Cervical back: Normal range of motion and neck supple.  Lymphadenopathy:    She has no cervical adenopathy.  Neurological: She is alert and oriented to person, place, and time. She displays normal reflexes. No cranial nerve deficit. She exhibits normal muscle tone. Coordination normal.  Skin: Skin is warm. No rash noted. She is not diaphoretic. No erythema. No pallor.  Psychiatric: She has a normal mood and affect. Her behavior is normal. Judgment and thought content normal.  Vitals reviewed.         Assessment & Plan:  Rib pain on left side - Plan: DG Ribs Unilateral Left  Age-related osteoporosis without current pathological fracture - Plan: DG Bone Density  Pure hypercholesterolemia - Plan: CBC with Differential/Platelet, COMPLETE METABOLIC PANEL WITH GFR, Lipid panel  Routine general medical examination at a health care facility  Von Willebrand disease ([place])  History of TIA (transient ischemic attack)  I will obtain an x-ray of her left ribs to evaluate further given the persistent pain.  However I suspect that she may have nerve irritation from recent rib fracture.  I will schedule the patient for a bone density test given her history of osteoporosis to evaluate the effectiveness of her Prolia.  Mammogram and colonoscopy are up-to-date and Pap smear is not required.  We will check CBC, CMP, and fasting lipid panel.  Goal LDL  cholesterol is less than 70 given her history of TIA.  She denies any depression, falls, or memory loss.  Blood pressures well controlled.  Regular anticipatory  guidance is provided.

## 2019-08-14 NOTE — Addendum Note (Signed)
Addended by: Shary Decamp B on: 08/14/2019 12:32 PM   Modules accepted: Orders

## 2019-08-15 LAB — CBC WITH DIFFERENTIAL/PLATELET
Absolute Monocytes: 644 cells/uL (ref 200–950)
Basophils Absolute: 29 cells/uL (ref 0–200)
Basophils Relative: 0.5 %
Eosinophils Absolute: 122 cells/uL (ref 15–500)
Eosinophils Relative: 2.1 %
HCT: 42.7 % (ref 35.0–45.0)
Hemoglobin: 14.1 g/dL (ref 11.7–15.5)
Lymphs Abs: 1473 cells/uL (ref 850–3900)
MCH: 31.8 pg (ref 27.0–33.0)
MCHC: 33 g/dL (ref 32.0–36.0)
MCV: 96.2 fL (ref 80.0–100.0)
MPV: 10.8 fL (ref 7.5–12.5)
Monocytes Relative: 11.1 %
Neutro Abs: 3532 cells/uL (ref 1500–7800)
Neutrophils Relative %: 60.9 %
Platelets: 204 10*3/uL (ref 140–400)
RBC: 4.44 10*6/uL (ref 3.80–5.10)
RDW: 12.5 % (ref 11.0–15.0)
Total Lymphocyte: 25.4 %
WBC: 5.8 10*3/uL (ref 3.8–10.8)

## 2019-08-15 LAB — COMPLETE METABOLIC PANEL WITH GFR
AG Ratio: 1.9 (calc) (ref 1.0–2.5)
ALT: 17 U/L (ref 6–29)
AST: 23 U/L (ref 10–35)
Albumin: 4.4 g/dL (ref 3.6–5.1)
Alkaline phosphatase (APISO): 41 U/L (ref 37–153)
BUN: 15 mg/dL (ref 7–25)
CO2: 26 mmol/L (ref 20–32)
Calcium: 9.3 mg/dL (ref 8.6–10.4)
Chloride: 104 mmol/L (ref 98–110)
Creat: 0.89 mg/dL (ref 0.60–0.93)
GFR, Est African American: 76 mL/min/{1.73_m2} (ref >=60)
GFR, Est Non African American: 66 mL/min/{1.73_m2} (ref >=60)
Globulin: 2.3 g/dL (calc) (ref 1.9–3.7)
Glucose, Bld: 86 mg/dL (ref 65–99)
Potassium: 4.4 mmol/L (ref 3.5–5.3)
Sodium: 143 mmol/L (ref 135–146)
Total Bilirubin: 0.7 mg/dL (ref 0.2–1.2)
Total Protein: 6.7 g/dL (ref 6.1–8.1)

## 2019-08-15 LAB — LIPID PANEL
Cholesterol: 179 mg/dL (ref <200)
HDL: 68 mg/dL (ref >=50)
LDL Cholesterol (Calc): 91 mg/dL (calc)
Non-HDL Cholesterol (Calc): 111 mg/dL (calc) (ref <130)
Total CHOL/HDL Ratio: 2.6 (calc) (ref <5.0)
Triglycerides: 102 mg/dL (ref <150)

## 2019-08-25 DIAGNOSIS — L570 Actinic keratosis: Secondary | ICD-10-CM | POA: Diagnosis not present

## 2019-09-07 ENCOUNTER — Other Ambulatory Visit: Payer: Self-pay | Admitting: Family Medicine

## 2019-09-28 ENCOUNTER — Other Ambulatory Visit: Payer: Self-pay | Admitting: Family Medicine

## 2019-09-28 DIAGNOSIS — Z0000 Encounter for general adult medical examination without abnormal findings: Secondary | ICD-10-CM

## 2019-09-28 DIAGNOSIS — F4321 Adjustment disorder with depressed mood: Secondary | ICD-10-CM

## 2019-09-28 NOTE — Telephone Encounter (Signed)
Ok to refill??  Last office visit 08/14/2019.  Last refill 05/26/2019.

## 2019-10-05 ENCOUNTER — Other Ambulatory Visit: Payer: Self-pay | Admitting: Family Medicine

## 2019-10-06 ENCOUNTER — Other Ambulatory Visit: Payer: Self-pay

## 2019-10-08 MED ORDER — CIMETIDINE 400 MG PO TABS: ORAL_TABLET | ORAL | 5 refills | Status: DC

## 2019-10-22 ENCOUNTER — Ambulatory Visit (INDEPENDENT_AMBULATORY_CARE_PROVIDER_SITE_OTHER): Payer: Medicare Other | Admitting: Family Medicine

## 2019-10-22 ENCOUNTER — Other Ambulatory Visit: Payer: Self-pay

## 2019-10-22 VITALS — BP 124/70 | HR 90 | Temp 97.4°F | Wt 130.0 lb

## 2019-10-22 DIAGNOSIS — H6123 Impacted cerumen, bilateral: Secondary | ICD-10-CM

## 2019-10-22 NOTE — Progress Notes (Signed)
Subjective:    Patient ID: Caroline Valdez, female    DOB: 14-Nov-1950, 69 y.o.   MRN: [id number]  Patient presents today with bilateral hearing loss x1 week.  On examination she has a complete total occlusion of her right auditory canal with cerumen.  There is basically a 90% occlusion of the left auditory canal with cerumen.  She denies any otalgia or tinnitus or headache or sinus pain. Past Medical History:  Diagnosis Date  . Blood dyscrasia   . Breast cancer ([place])    left  . Complication of anesthesia    1980S  TUBAL LIG, + HEMM SURGERY PT PASSED OUT AND WENT INTO SHOCK.  NO PROBLEMS SINCE   . 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 Endometriosis   . Fibromyalgia   . GERD (gastroesophageal reflux disease)   . Hypercholesteremia   . Mini stroke ([place]) 09/2012  . Radiculopathy   . Stroke [facility])    MINI STROKE  . Von Willebrand disease ([place]) 09/11/2013  . Von Willebrand's disease [facility])    history of   Past Surgical History:  Procedure Laterality Date  . ABDOMINAL HYSTERECTOMY    . BACK SURGERY    . BREAST IMPLANT REMOVAL Right 12/05/2017  . BREAST IMPLANT REMOVAL Right 12/05/2017   Procedure: REMOVAL RIGHT  BREAST IMPLANT MATERIAL;  Surgeon: Crissie Reese, MD;  Location: [place];  Service: Plastics;  Laterality: Right;  . BREAST RECONSTRUCTION    . BREAST RECONSTRUCTION Right 12/05/2017   Procedure: DELAYED BREAST RECONSTRUCTION WITH SILICONE IMPLANT;  Surgeon: Crissie Reese, MD;  Location: [place];  Service: Plastics;  Laterality: Right;  . CARPAL TUNNEL RELEASE     right  . COLONOSCOPY  01/2010   Dr. Aviva Signs: normal  . COLONOSCOPY WITH PROPOFOL N/A 01/22/2019   Procedure: COLONOSCOPY WITH PROPOFOL;  Surgeon: Daneil Dolin, MD;  [facility];  Service: Endoscopy;  Laterality: N/A;  2:30pm - can not come earlier due to transportation  . FINGER SURGERY     LEFT THUMB  . HEMORRHOID SURGERY     X2  . LAMINECTOMY    . MASTECTOMY     left  . SHOULDER  SURGERY    . SKIN CANCER EXCISION    . TONSILLECTOMY    . TUBAL LIGATION     Current Outpatient Medications on File Prior to Visit  Medication Sig Dispense Refill  . acetaminophen (TYLENOL) 500 MG tablet Take 1,000 mg by mouth daily as needed for headache.    Marland Kitchen acyclovir (ZOVIRAX) 400 MG tablet Take 1 tablet (400 mg total) by mouth 3 (three) times daily. (Patient taking differently: Take 400 mg by mouth 3 (three) times daily as needed (fever blisters). ) 30 tablet 0  . ALPRAZolam (XANAX) 0.5 MG tablet TAKE 1 TABLET(0.5 MG) BY MOUTH AT BEDTIME AS NEEDED 60 tablet 0  . calcium carbonate (OS-CAL) 600 MG TABS Take 600 mg by mouth daily.     . cholecalciferol (VITAMIN D) 1000 UNITS tablet Take 1,000 Units by mouth daily.    . cimetidine (TAGAMET) 400 MG tablet TAKE 1 TABLET(400 MG) BY MOUTH TWICE DAILY AS NEEDED 60 tablet 5  . fluticasone (FLONASE) 50 MCG/ACT nasal spray Place 1 spray into both nostrils daily.    Marland Kitchen glucosamine-chondroitin 500-400 MG tablet Take 1 tablet by mouth 3 (three) times daily.    Marland Kitchen loratadine (CLARITIN) 10 MG tablet Take 10 mg by mouth daily.    . Multiple Vitamin (MULTIVITAMIN  WITH MINERALS) TABS tablet Take 1 tablet by mouth daily.    . Multiple Vitamins-Minerals (EYE VITAMINS PO) Take 1 tablet by mouth daily.     . pantoprazole (PROTONIX) 40 MG tablet TAKE 1 TABLET TWICE A DAY 180 tablet 3  . rosuvastatin (CRESTOR) 20 MG tablet TAKE 1 TABLET DAILY 90 tablet 3  . zinc sulfate 220 (50 Zn) MG capsule Take 220 mg by mouth daily.     Marland Kitchen HYDROmorphone (DILAUDID) 2 MG tablet Take 1 tablet (2 mg total) by mouth every 4 (four) hours as needed for severe pain. (Patient not taking: Reported on 10/22/2019) 30 tablet 0   No current facility-administered medications on file prior to visit.   Allergies  Allergen Reactions  . Aspirin Other (See Comments)    Pt has Von Willebrands Disease  . Hydrocodone Itching  . Oxycodone Itching  . Tape Dermatitis    Pulls off skin   Social  History   Socioeconomic History  . Marital status: Widowed    Spouse name: Not on file  . Number of children: Not on file  . Years of education: Not on file  . Highest education level: Not on file  Occupational History  . Not on file  Tobacco Use  . Smoking status: Never Smoker  . Smokeless tobacco: Never Used  Substance and Sexual Activity  . Alcohol use: No  . Drug use: No  . Sexual activity: Not on file    Comment: husband recently died from myelodysplastic syndrome in January of 2014  Other Topics Concern  . Not on file  Social History Narrative  . Not on file   Social Determinants of Health   Financial Resource Strain:   . Difficulty of Paying Living Expenses:   Food Insecurity:   . Worried About Charity fundraiser in the Last Year:   . Arboriculturist in the Last Year:   Transportation Needs:   . Film/video editor (Medical):   Marland Kitchen Lack of Transportation (Non-Medical):   Physical Activity:   . Days of Exercise per Week:   . Minutes of Exercise per Session:   Stress:   . Feeling of Stress :   Social Connections:   . Frequency of Communication with Friends and Family:   . Frequency of Social Gatherings with Friends and Family:   . Attends Religious Services:   . Active Member of Clubs or Organizations:   . Attends Archivist Meetings:   Marland Kitchen Marital Status:   Intimate Partner Violence:   . Fear of Current or Ex-Partner:   . Emotionally Abused:   Marland Kitchen Physically Abused:   . Sexually Abused:    Family History  Problem Relation Age of Onset  . Breast cancer Mother 41       now 28yo  . Stroke Father        Father had no full sisters  . Breast cancer Sister 26       now 8yo; had TAH/BSO  . Lung cancer Brother        died 93yo; smoker   Immunization History  Administered Date(s) Administered  . Fluad Quad(high Dose 65+) 02/13/2019  . Influenza Split 01/19/2014  . Influenza Whole 03/07/2009, 03/06/2010  . Influenza,inj,Quad PF,6+ Mos 01/26/2013,  02/17/2015, 01/28/2017, 02/06/2018  . Influenza-Unspecified 03/09/2016  . PFIZER SARS-COV-2 Vaccination 06/25/2019, 07/16/2019  . Pneumococcal Conjugate-13 10/22/2014  . Pneumococcal Polysaccharide-23 09/07/2010, 03/23/2016  . Td 12/25/2007  . Tdap 12/25/2007  . Zoster 03/30/2010  .  Zoster Recombinat (Shingrix) 07/23/2017, 10/22/2017   Mammogram is up-to-date. Colonoscopy is up-to-date.  Does not require a Pap smear given the fact she's had a hysterectomy.    Review of Systems  All other systems reviewed and are negative.      Objective:   Physical Exam  Constitutional: She appears well-developed and well-nourished. No distress.  HENT:  Head: Normocephalic and atraumatic.  Eyes: Conjunctivae are normal.  Cardiovascular: Normal rate, regular rhythm, normal heart sounds and intact distal pulses. Exam reveals no gallop and no friction rub.  No murmur heard. Pulmonary/Chest: Effort normal and breath sounds normal. No respiratory distress. She has no wheezes. She has no rales. She exhibits no tenderness.  Skin: She is not diaphoretic.  Vitals reviewed.  Bilateral hearing loss due to cerumen impactions.       Assessment & Plan:  Bilateral impacted cerumen  Cerumen impaction was easily cleared with irrigation and lavage.  Patient tolerated procedure well without complication.

## 2019-10-28 ENCOUNTER — Other Ambulatory Visit: Payer: Self-pay

## 2019-11-02 ENCOUNTER — Other Ambulatory Visit: Payer: Self-pay

## 2019-11-02 MED ORDER — CIMETIDINE 400 MG PO TABS: ORAL_TABLET | ORAL | 5 refills | Status: DC

## 2019-11-03 ENCOUNTER — Other Ambulatory Visit: Payer: Self-pay | Admitting: Family Medicine

## 2019-11-03 MED ORDER — CIMETIDINE 400 MG PO TABS: ORAL_TABLET | ORAL | 1 refills | Status: DC

## 2019-12-10 ENCOUNTER — Encounter: Payer: Self-pay | Admitting: Family Medicine

## 2019-12-10 ENCOUNTER — Telehealth: Payer: Self-pay | Admitting: Family Medicine

## 2019-12-10 NOTE — Telephone Encounter (Signed)
Patient rubbed up against a piece of metal. She would like to know if she needs a tetanus shot for this. She states it wasn't rusty but she knows it has been over 10 years since she had a tetanus shot.   CB#  [phone]

## 2019-12-11 ENCOUNTER — Ambulatory Visit (INDEPENDENT_AMBULATORY_CARE_PROVIDER_SITE_OTHER): Payer: Medicare Other | Admitting: *Deleted

## 2019-12-11 DIAGNOSIS — Z23 Encounter for immunization: Secondary | ICD-10-CM

## 2019-12-11 DIAGNOSIS — S81832A Puncture wound without foreign body, left lower leg, initial encounter: Secondary | ICD-10-CM | POA: Diagnosis not present

## 2019-12-11 NOTE — Telephone Encounter (Signed)
Message handled in [place] encounter.

## 2019-12-28 ENCOUNTER — Encounter: Payer: Self-pay | Admitting: Family Medicine

## 2019-12-29 ENCOUNTER — Ambulatory Visit (HOSPITAL_COMMUNITY)
Admission: RE | Admit: 2019-12-29 | Discharge: 2019-12-29 | Disposition: A | Discharge status: Home / Self Care | Payer: Medicare Other | Source: Ambulatory Visit | Attending: Family Medicine | Admitting: Family Medicine

## 2019-12-29 ENCOUNTER — Other Ambulatory Visit: Payer: Self-pay

## 2019-12-29 ENCOUNTER — Other Ambulatory Visit: Payer: Self-pay | Admitting: Family Medicine

## 2019-12-29 DIAGNOSIS — S99922A Unspecified injury of left foot, initial encounter: Secondary | ICD-10-CM | POA: Diagnosis not present

## 2019-12-29 DIAGNOSIS — M7989 Other specified soft tissue disorders: Secondary | ICD-10-CM | POA: Diagnosis not present

## 2019-12-31 ENCOUNTER — Encounter: Payer: Self-pay | Admitting: Family Medicine

## 2020-01-07 DIAGNOSIS — H353131 Nonexudative age-related macular degeneration, bilateral, early dry stage: Secondary | ICD-10-CM | POA: Diagnosis not present

## 2020-01-15 ENCOUNTER — Other Ambulatory Visit: Payer: Self-pay | Admitting: Family Medicine

## 2020-01-15 DIAGNOSIS — Z0000 Encounter for general adult medical examination without abnormal findings: Secondary | ICD-10-CM

## 2020-01-15 DIAGNOSIS — F4321 Adjustment disorder with depressed mood: Secondary | ICD-10-CM

## 2020-01-18 NOTE — Telephone Encounter (Signed)
Requested Prescriptions   Pending Prescriptions Disp Refills   ALPRAZolam (XANAX) 0.5 MG tablet [Pharmacy Med Name: ALPRAZOLAM 0.5MG  TABLETS] 60 tablet 0    Sig: TAKE 1 TABLET(0.5 MG) BY MOUTH AT BEDTIME AS NEEDED     Last OV 10/22/2019   Last written 09/29/2019

## 2020-01-29 ENCOUNTER — Encounter: Payer: Self-pay | Admitting: Family Medicine

## 2020-01-29 MED ORDER — ACYCLOVIR 400 MG PO TABS: 400.0000 mg | ORAL_TABLET | Freq: Three times a day (TID) | ORAL | 0 refills | Status: DC | PRN

## 2020-02-03 ENCOUNTER — Other Ambulatory Visit: Payer: Medicare Other

## 2020-02-03 ENCOUNTER — Other Ambulatory Visit: Payer: Self-pay

## 2020-02-03 DIAGNOSIS — Z20822 Contact with and (suspected) exposure to covid-19: Secondary | ICD-10-CM

## 2020-02-05 LAB — SARS-COV-2, NAA 2 DAY TAT

## 2020-02-05 LAB — NOVEL CORONAVIRUS, NAA: SARS-CoV-2, NAA: DETECTED — AB

## 2020-02-06 ENCOUNTER — Telehealth: Payer: Self-pay | Admitting: Family

## 2020-02-06 ENCOUNTER — Other Ambulatory Visit: Payer: Self-pay | Admitting: Family

## 2020-02-06 DIAGNOSIS — E7800 Pure hypercholesterolemia, unspecified: Secondary | ICD-10-CM

## 2020-02-06 DIAGNOSIS — U071 COVID-19: Secondary | ICD-10-CM

## 2020-02-06 DIAGNOSIS — D6800 Von Willebrand disease, unspecified: Secondary | ICD-10-CM

## 2020-02-06 DIAGNOSIS — D0512 Intraductal carcinoma in situ of left breast: Secondary | ICD-10-CM

## 2020-02-06 NOTE — Telephone Encounter (Signed)
Called to Discuss with patient about Covid symptoms and the use of the monoclonal antibody infusion for those with mild to moderate Covid symptoms and at a high risk of hospitalization.     Pt appears to qualify for this infusion due to co-morbid conditions and/or a member of an at-risk group in accordance with the FDA Emergency Use Authorization.    Caroline Valdez had a positive COVID test on 02/05/20 and was experiencing cough, fever, headaches, nausea and weakness that started on 02/02/20. Qualifying risk factors include breast cancer, age, and hypercholesterolemia.  Spoke with Caroline Valdez regarding the risks/benefits of Regeneron and she wishes to continue with treatment.  [place],   You have been scheduled to receive Regeneron (the monoclonal antibody we discussed) on : 02/07/20 at 2pm  If you have been tested outside of a [facility] - you MUST bring a copy of your positive test with you the morning of your appointment. You may take a photo of this and upload to your MyChart portal or have the testing facility fax the result to [phone]    The address for the infusion clinic site is:  --GPS address is [place] - the parking is located near [employer] building where you will see  COVID19 Infusion feather banner marking the entrance to parking.   (see photos below)            --Enter into the 2nd entrance where the "wave, flag banner" is at the road. Turn into this 2nd entrance and immediately turn left to park in 1 of the 5 parking spots.   --Please stay in your car and call the desk for assistance inside [phone].   --Average time in department is roughly 2 hours for Regeneron treatment - this includes preparation of the medication, IV start and the required 1 hour monitoring after the infusion.    Should you develop worsening shortness of breath, chest pain or severe breathing problems please do not wait for this appointment and go to the  Emergency room for evaluation and treatment. You will undergo another oxygen screen before your infusion to ensure this is the best treatment option for you. There is a chance that the best decision may be to send you to the Emergency Room for evaluation at the time of your appointment.   The day of your visit you should:  Get plenty of rest the night before and drink plenty of water  Eat a light meal/snack before coming and take your medications as prescribed   Wear warm, comfortable clothes with a shirt that can roll-up over the elbow (will need IV start).   Wear a mask   Consider bringing some activity to help pass the time  Many commercial insurers are waiving bills related to [facility] treatment however some have ranged from $300-640. We are starting to see some insurers send bills to patients later for the administration of the medication - we are learning more information but you may receive a bill after your appointment.  Please contact your insurance agent to discuss prior to your appointment if you would like further details about billing specific to your policy.    The CPT code is [phone] for your reference.   Caroline Piedra, NP 02/06/2020 2:40 PM

## 2020-02-06 NOTE — Progress Notes (Signed)
I connected by phone with Caroline Valdez on 02/06/2020 at 2:40 PM to discuss the potential use of a new treatment for mild to moderate COVID-19 viral infection in non-hospitalized patients.  This patient is a 69 y.o. female that meets the FDA criteria for Emergency Use Authorization of COVID monoclonal antibody casirivimab/imdevimab.  Has a (+) direct SARS-CoV-2 viral test result  Has mild or moderate COVID-19   Is NOT hospitalized due to COVID-19  Is within 10 days of symptom onset  Has at least one of the high risk factor(s) for progression to severe COVID-19 and/or hospitalization as defined in EUA.  Specific high risk criteria : Older age (>/= 70 yo), Immunosuppressive Disease or Treatment and Cardiovascular disease or hypertension   I have spoken and communicated the following to the patient or parent/caregiver regarding COVID monoclonal antibody treatment:  1. FDA has authorized the emergency use for the treatment of mild to moderate COVID-19 in adults and pediatric patients with positive results of direct SARS-CoV-2 viral testing who are 51 years of age and older weighing at least 40 kg, and who are at high risk for progressing to severe COVID-19 and/or hospitalization.  2. The significant known and potential risks and benefits of COVID monoclonal antibody, and the extent to which such potential risks and benefits are unknown.  3. Information on available alternative treatments and the risks and benefits of those alternatives, including clinical trials.  4. Patients treated with COVID monoclonal antibody should continue to self-isolate and use infection control measures (e.g., wear mask, isolate, social distance, avoid sharing personal items, clean and disinfect "high touch" surfaces, and frequent handwashing) according to CDC guidelines.   5. The patient or parent/caregiver has the option to accept or refuse COVID monoclonal antibody treatment.  After reviewing this information  with the patient, The patient agreed to proceed with receiving casirivimab\imdevimab infusion and will be provided a copy of the Fact sheet prior to receiving the infusion.   Mauricio Po, NP 02/06/2020 2:40 PM

## 2020-02-07 ENCOUNTER — Ambulatory Visit (HOSPITAL_COMMUNITY)
Admission: RE | Admit: 2020-02-07 | Discharge: 2020-02-07 | Disposition: A | Discharge status: Home / Self Care | Payer: Medicare Other | Source: Ambulatory Visit | Attending: Pulmonary Disease | Admitting: Pulmonary Disease

## 2020-02-07 ENCOUNTER — Telehealth: Payer: Self-pay | Admitting: Physician Assistant

## 2020-02-07 DIAGNOSIS — Z23 Encounter for immunization: Secondary | ICD-10-CM | POA: Diagnosis not present

## 2020-02-07 DIAGNOSIS — D6800 Von Willebrand disease, unspecified: Secondary | ICD-10-CM

## 2020-02-07 DIAGNOSIS — U071 COVID-19: Secondary | ICD-10-CM

## 2020-02-07 DIAGNOSIS — E7800 Pure hypercholesterolemia, unspecified: Secondary | ICD-10-CM

## 2020-02-07 DIAGNOSIS — D0512 Intraductal carcinoma in situ of left breast: Secondary | ICD-10-CM | POA: Diagnosis not present

## 2020-02-07 MED ORDER — FAMOTIDINE IN NACL 20-0.9 MG/50ML-% IV SOLN: 20.0000 mg | Freq: Once | INTRAVENOUS | Status: DC | PRN

## 2020-02-07 MED ORDER — SODIUM CHLORIDE 0.9 % IV SOLN
1200.0000 mg | Freq: Once | INTRAVENOUS | Status: AC
  Administered 2020-02-07: 1200 mg via INTRAVENOUS

## 2020-02-07 MED ORDER — DIPHENHYDRAMINE HCL 50 MG/ML IJ SOLN: 50.0000 mg | Freq: Once | INTRAMUSCULAR | Status: DC | PRN

## 2020-02-07 MED ORDER — SODIUM CHLORIDE 0.9 % IV SOLN: INTRAVENOUS | Status: DC | PRN

## 2020-02-07 MED ORDER — METHYLPREDNISOLONE SODIUM SUCC 125 MG IJ SOLR: 125.0000 mg | Freq: Once | INTRAMUSCULAR | Status: DC | PRN

## 2020-02-07 MED ORDER — EPINEPHRINE 0.3 MG/0.3ML IJ SOAJ: 0.3000 mg | Freq: Once | INTRAMUSCULAR | Status: DC | PRN

## 2020-02-07 MED ORDER — ALBUTEROL SULFATE HFA 108 (90 BASE) MCG/ACT IN AERS: 2.0000 | INHALATION_SPRAY | Freq: Once | RESPIRATORY_TRACT | Status: DC | PRN

## 2020-02-07 NOTE — Discharge Instructions (Signed)

## 2020-02-07 NOTE — Telephone Encounter (Addendum)
entered in error   

## 2020-02-07 NOTE — Progress Notes (Signed)
  Diagnosis: COVID-19  Physician: Dr. Asencion Noble  Procedure: Covid Infusion Clinic Med: casirivimab\imdevimab infusion - Provided patient with casirivimab\imdevimab fact sheet for patients, parents and caregivers prior to infusion.  Complications: No immediate complications noted.  Discharge: Discharged home   Janine Ores 02/07/2020

## 2020-02-07 NOTE — Progress Notes (Signed)
°  Diagnosis: COVID-19 ° °Physician: Wright, MD ° °Procedure: [facility] Med: casirivimab\imdevimab infusion - Provided patient with casirivimab\imdevimab fact sheet for patients, parents and caregivers prior to infusion. ° °Complications: No immediate complications noted. ° °Discharge: Discharged home  ° °Alejandra Hunt R Naythan Douthit °02/07/2020 ° ° °

## 2020-02-18 ENCOUNTER — Ambulatory Visit (INDEPENDENT_AMBULATORY_CARE_PROVIDER_SITE_OTHER): Payer: Medicare Other | Admitting: Family Medicine

## 2020-02-18 ENCOUNTER — Other Ambulatory Visit: Payer: Self-pay

## 2020-02-18 VITALS — BP 130/80 | HR 98 | Temp 97.3°F | Ht 65.0 in | Wt 131.0 lb

## 2020-02-18 DIAGNOSIS — E7800 Pure hypercholesterolemia, unspecified: Secondary | ICD-10-CM

## 2020-02-18 DIAGNOSIS — M810 Age-related osteoporosis without current pathological fracture: Secondary | ICD-10-CM

## 2020-02-18 DIAGNOSIS — D6800 Von Willebrand disease, unspecified: Secondary | ICD-10-CM

## 2020-02-18 DIAGNOSIS — Z8673 Personal history of transient ischemic attack (TIA), and cerebral infarction without residual deficits: Secondary | ICD-10-CM | POA: Diagnosis not present

## 2020-02-18 DIAGNOSIS — R300 Dysuria: Secondary | ICD-10-CM

## 2020-02-18 DIAGNOSIS — Z13220 Encounter for screening for lipoid disorders: Secondary | ICD-10-CM | POA: Diagnosis not present

## 2020-02-18 DIAGNOSIS — Z136 Encounter for screening for cardiovascular disorders: Secondary | ICD-10-CM | POA: Diagnosis not present

## 2020-02-18 MED ORDER — DENOSUMAB 60 MG/ML ~~LOC~~ SOSY
60.0000 mg | PREFILLED_SYRINGE | Freq: Once | SUBCUTANEOUS | Status: AC
  Administered 2020-08-26: 60 mg via SUBCUTANEOUS

## 2020-02-18 MED ORDER — DENOSUMAB 60 MG/ML ~~LOC~~ SOSY
60.0000 mg | PREFILLED_SYRINGE | Freq: Once | SUBCUTANEOUS | Status: AC
  Administered 2020-02-18: 60 mg via SUBCUTANEOUS

## 2020-02-18 MED ORDER — SULFAMETHOXAZOLE-TRIMETHOPRIM 800-160 MG PO TABS: 1.0000 | ORAL_TABLET | Freq: Two times a day (BID) | ORAL | 0 refills | Status: DC

## 2020-02-18 NOTE — Progress Notes (Signed)
Subjective:    Patient ID: Caroline Valdez, female    DOB: May 14, 1951, 69 y.o.   MRN: [id number]  Medication Refill  Urinary Tract Infection    Patient is a very pleasant 69 year old Caucasian female here today for follow-up of her hyperlipidemia.  She also has a history of a TIA.  She denies any chest pain shortness of breath or dyspnea on exertion.  She denies any strokelike symptoms.  She did recently have Covid but she has recovered approximately 2 weeks ago.  She is due for her flu shot but she would like to defer that for another week or 2 until she feels stronger.  She does report dysuria urgency and frequency.  This began 3 days ago.  She has been taking Azo over-the-counter to help with symptoms.  She denies any fevers or chills. Past Medical History:  Diagnosis Date  . Blood dyscrasia   . Breast cancer ([place])    left  . Complication of anesthesia    1980S  TUBAL LIG, + HEMM SURGERY PT PASSED OUT AND WENT INTO SHOCK.  NO PROBLEMS SINCE   . 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 Endometriosis   . Fibromyalgia   . GERD (gastroesophageal reflux disease)   . Hypercholesteremia   . Mini stroke ([place]) 09/2012  . Radiculopathy   . Stroke [facility])    MINI STROKE  . Von Willebrand disease ([place]) 09/11/2013  . Von Willebrand's disease [facility])    history of   Past Surgical History:  Procedure Laterality Date  . ABDOMINAL HYSTERECTOMY    . BACK SURGERY    . BREAST IMPLANT REMOVAL Right 12/05/2017  . BREAST IMPLANT REMOVAL Right 12/05/2017   Procedure: REMOVAL RIGHT  BREAST IMPLANT MATERIAL;  Surgeon: Crissie Reese, MD;  Location: [place];  Service: Plastics;  Laterality: Right;  . BREAST RECONSTRUCTION    . BREAST RECONSTRUCTION Right 12/05/2017   Procedure: DELAYED BREAST RECONSTRUCTION WITH SILICONE IMPLANT;  Surgeon: Crissie Reese, MD;  Location: [place];  Service: Plastics;  Laterality: Right;  . CARPAL TUNNEL RELEASE     right  . COLONOSCOPY  01/2010   Dr. Aviva Signs:  normal  . COLONOSCOPY WITH PROPOFOL N/A 01/22/2019   Procedure: COLONOSCOPY WITH PROPOFOL;  Surgeon: Daneil Dolin, MD;  [facility];  Service: Endoscopy;  Laterality: N/A;  2:30pm - can not come earlier due to transportation  . FINGER SURGERY     LEFT THUMB  . HEMORRHOID SURGERY     X2  . LAMINECTOMY    . MASTECTOMY     left  . SHOULDER SURGERY    . SKIN CANCER EXCISION    . TONSILLECTOMY    . TUBAL LIGATION     Current Outpatient Medications on File Prior to Visit  Medication Sig Dispense Refill  . acetaminophen (TYLENOL) 500 MG tablet Take 1,000 mg by mouth daily as needed for headache.    Marland Kitchen acyclovir (ZOVIRAX) 400 MG tablet Take 1 tablet (400 mg total) by mouth 3 (three) times daily as needed (fever blisters). 30 tablet 0  . ALPRAZolam (XANAX) 0.5 MG tablet TAKE 1 TABLET(0.5 MG) BY MOUTH AT BEDTIME AS NEEDED 60 tablet 0  . cholecalciferol (VITAMIN D) 1000 UNITS tablet Take 1,000 Units by mouth daily.    . cimetidine (TAGAMET) 400 MG tablet TAKE 1 TABLET(400 MG) BY MOUTH TWICE DAILY AS NEEDED 180 tablet 1  . fluticasone (FLONASE) 50 MCG/ACT nasal spray Place 1 spray into both  nostrils daily.    Marland Kitchen glucosamine-chondroitin 500-400 MG tablet Take 1 tablet by mouth 3 (three) times daily.    Marland Kitchen loratadine (CLARITIN) 10 MG tablet Take 10 mg by mouth daily.    . Multiple Vitamin (MULTIVITAMIN WITH MINERALS) TABS tablet Take 1 tablet by mouth daily.    . Multiple Vitamins-Minerals (EYE VITAMINS PO) Take 1 tablet by mouth daily.     . pantoprazole (PROTONIX) 40 MG tablet TAKE 1 TABLET TWICE A DAY 180 tablet 3  . rosuvastatin (CRESTOR) 20 MG tablet TAKE 1 TABLET DAILY 90 tablet 3  . zinc sulfate 220 (50 Zn) MG capsule Take 220 mg by mouth daily.     . calcium carbonate (OS-CAL) 600 MG TABS Take 600 mg by mouth daily.     Marland Kitchen HYDROmorphone (DILAUDID) 2 MG tablet Take 1 tablet (2 mg total) by mouth every 4 (four) hours as needed for severe pain. (Patient not taking: Reported on  10/22/2019) 30 tablet 0   No current facility-administered medications on file prior to visit.   Allergies  Allergen Reactions  . Aspirin Other (See Comments)    Pt has Von Willebrands Disease  . Hydrocodone Itching  . Oxycodone Itching  . Tape Dermatitis    Pulls off skin   Social History   Socioeconomic History  . Marital status: Widowed    Spouse name: Not on file  . Number of children: Not on file  . Years of education: Not on file  . Highest education level: Not on file  Occupational History  . Not on file  Tobacco Use  . Smoking status: Never Smoker  . Smokeless tobacco: Never Used  Vaping Use  . Vaping Use: Never used  Substance and Sexual Activity  . Alcohol use: No  . Drug use: No  . Sexual activity: Not on file    Comment: husband recently died from myelodysplastic syndrome in January of 2014  Other Topics Concern  . Not on file  Social History Narrative  . Not on file   Social Determinants of Health   Financial Resource Strain:   . Difficulty of Paying Living Expenses: Not on file  Food Insecurity:   . Worried About Charity fundraiser in the Last Year: Not on file  . Ran Out of Food in the Last Year: Not on file  Transportation Needs:   . Lack of Transportation (Medical): Not on file  . Lack of Transportation (Non-Medical): Not on file  Physical Activity:   . Days of Exercise per Week: Not on file  . Minutes of Exercise per Session: Not on file  Stress:   . Feeling of Stress : Not on file  Social Connections:   . Frequency of Communication with Friends and Family: Not on file  . Frequency of Social Gatherings with Friends and Family: Not on file  . Attends Religious Services: Not on file  . Active Member of Clubs or Organizations: Not on file  . Attends Archivist Meetings: Not on file  . Marital Status: Not on file  Intimate Partner Violence:   . Fear of Current or Ex-Partner: Not on file  . Emotionally Abused: Not on file  .  Physically Abused: Not on file  . Sexually Abused: Not on file   Family History  Problem Relation Age of Onset  . Breast cancer Mother 6       now 43yo  . Stroke Father        Father had no  full sisters  . Breast cancer Sister 46       now 60yo; had TAH/BSO  . Lung cancer Brother        died 72yo; smoker   Immunization History  Administered Date(s) Administered  . Fluad Quad(high Dose 65+) 02/13/2019  . Influenza Split 01/19/2014  . Influenza Whole 03/07/2009, 03/06/2010  . Influenza,inj,Quad PF,6+ Mos 01/26/2013, 02/17/2015, 01/28/2017, 02/06/2018  . Influenza-Unspecified 03/09/2016  . PFIZER SARS-COV-2 Vaccination 06/25/2019, 07/16/2019  . Pneumococcal Conjugate-13 10/22/2014  . Pneumococcal Polysaccharide-23 09/07/2010, 03/23/2016  . Td 12/25/2007  . Tdap 12/25/2007, 04/17/2010, 12/11/2019  . Zoster 03/30/2010  . Zoster Recombinat (Shingrix) 07/23/2017, 10/22/2017   Mammogram is up-to-date. Colonoscopy is up-to-date.  Does not require a Pap smear given the fact she's had a hysterectomy.    Review of Systems  All other systems reviewed and are negative.      Objective:   Physical Exam Vitals reviewed.  Constitutional:      General: She is not in acute distress.    Appearance: She is well-developed. She is not diaphoretic.  HENT:     Head: Normocephalic and atraumatic.     Right Ear: External ear normal.     Left Ear: External ear normal.     Nose: Nose normal.     Mouth/Throat:     Pharynx: No oropharyngeal exudate.  Eyes:     General: No scleral icterus.       Right eye: No discharge.        Left eye: No discharge.     Conjunctiva/sclera: Conjunctivae normal.     Pupils: Pupils are equal, round, and reactive to light.  Neck:     Thyroid: No thyromegaly.     Vascular: No JVD.     Trachea: No tracheal deviation.  Cardiovascular:     Rate and Rhythm: Normal rate and regular rhythm.     Heart sounds: Normal heart sounds. No murmur heard.  No friction  rub. No gallop.   Pulmonary:     Effort: Pulmonary effort is normal. No respiratory distress.     Breath sounds: Normal breath sounds. No stridor. No wheezing or rales.  Chest:     Chest wall: No tenderness.  Abdominal:     General: Bowel sounds are normal. There is no distension.     Palpations: Abdomen is soft.     Tenderness: There is no abdominal tenderness. There is no guarding or rebound.  Musculoskeletal:        General: No tenderness. Normal range of motion.     Cervical back: Normal range of motion and neck supple.  Lymphadenopathy:     Cervical: No cervical adenopathy.  Skin:    General: Skin is warm.     Coloration: Skin is not pale.     Findings: No erythema or rash.  Neurological:     Mental Status: She is alert and oriented to person, place, and time.     Cranial Nerves: No cranial nerve deficit.     Motor: No abnormal muscle tone.     Coordination: Coordination normal.     Deep Tendon Reflexes: Reflexes normal.  Psychiatric:        Behavior: Behavior normal.        Thought Content: Thought content normal.        Judgment: Judgment normal.           Assessment & Plan:  Osteoporosis, post-menopausal - Plan: denosumab (PROLIA) injection 60 mg, denosumab (PROLIA) injection 60 mg  History of TIA (transient ischemic attack) - Plan: CBC with Differential/Platelet, COMPLETE METABOLIC PANEL WITH GFR, Lipid panel  Pure hypercholesterolemia  Von Willebrand disease ([place])  Dysuria   Exam today is normal.  CBC and CMP were checked in June and were normal.  I will check a fasting lipid panel today.  Her goal LDL cholesterol is less than 70.  Urinalysis will be affected by Azo.  Based on her symptoms I will treat a urinary tract infection.  Gave the patient Bactrim double strength tablets for 5 days.  Offered her a flu shot when she feels better.  She also received her Prolia injection today.  She denies any bleeding or bruising related to her 1 Willebrand's disease.   No surgery is planned.

## 2020-02-19 LAB — CBC WITH DIFFERENTIAL/PLATELET
Absolute Monocytes: 696 cells/uL (ref 200–950)
Basophils Absolute: 41 cells/uL (ref 0–200)
Basophils Relative: 0.7 %
Eosinophils Absolute: 71 cells/uL (ref 15–500)
Eosinophils Relative: 1.2 %
HCT: 41.9 % (ref 35.0–45.0)
Hemoglobin: 14 g/dL (ref 11.7–15.5)
Lymphs Abs: 1528 cells/uL (ref 850–3900)
MCH: 31.7 pg (ref 27.0–33.0)
MCHC: 33.4 g/dL (ref 32.0–36.0)
MCV: 94.8 fL (ref 80.0–100.0)
MPV: 10.6 fL (ref 7.5–12.5)
Monocytes Relative: 11.8 %
Neutro Abs: 3564 cells/uL (ref 1500–7800)
Neutrophils Relative %: 60.4 %
Platelets: 275 10*3/uL (ref 140–400)
RBC: 4.42 10*6/uL (ref 3.80–5.10)
RDW: 12.5 % (ref 11.0–15.0)
Total Lymphocyte: 25.9 %
WBC: 5.9 10*3/uL (ref 3.8–10.8)

## 2020-02-19 LAB — COMPLETE METABOLIC PANEL WITH GFR
AG Ratio: 1.9 (calc) (ref 1.0–2.5)
ALT: 16 U/L (ref 6–29)
AST: 20 U/L (ref 10–35)
Albumin: 4.3 g/dL (ref 3.6–5.1)
Alkaline phosphatase (APISO): 42 U/L (ref 37–153)
BUN: 10 mg/dL (ref 7–25)
CO2: 28 mmol/L (ref 20–32)
Calcium: 9.4 mg/dL (ref 8.6–10.4)
Chloride: 103 mmol/L (ref 98–110)
Creat: 0.93 mg/dL (ref 0.60–0.93)
GFR, Est African American: 72 mL/min/{1.73_m2} (ref >=60)
GFR, Est Non African American: 62 mL/min/{1.73_m2} (ref >=60)
Globulin: 2.3 g/dL (calc) (ref 1.9–3.7)
Glucose, Bld: 90 mg/dL (ref 65–99)
Potassium: 4.5 mmol/L (ref 3.5–5.3)
Sodium: 142 mmol/L (ref 135–146)
Total Bilirubin: 0.6 mg/dL (ref 0.2–1.2)
Total Protein: 6.6 g/dL (ref 6.1–8.1)

## 2020-02-19 LAB — LIPID PANEL
Cholesterol: 169 mg/dL (ref <200)
HDL: 60 mg/dL (ref >=50)
LDL Cholesterol (Calc): 88 mg/dL (calc)
Non-HDL Cholesterol (Calc): 109 mg/dL (calc) (ref <130)
Total CHOL/HDL Ratio: 2.8 (calc) (ref <5.0)
Triglycerides: 118 mg/dL (ref <150)

## 2020-03-02 DIAGNOSIS — C44311 Basal cell carcinoma of skin of nose: Secondary | ICD-10-CM | POA: Diagnosis not present

## 2020-03-02 DIAGNOSIS — L570 Actinic keratosis: Secondary | ICD-10-CM | POA: Diagnosis not present

## 2020-03-07 ENCOUNTER — Ambulatory Visit: Payer: Medicare Other | Admitting: Family Medicine

## 2020-03-17 DIAGNOSIS — C44311 Basal cell carcinoma of skin of nose: Secondary | ICD-10-CM | POA: Diagnosis not present

## 2020-04-04 DIAGNOSIS — Z23 Encounter for immunization: Secondary | ICD-10-CM | POA: Diagnosis not present

## 2020-04-13 ENCOUNTER — Other Ambulatory Visit: Payer: Self-pay | Admitting: Family Medicine

## 2020-05-03 ENCOUNTER — Other Ambulatory Visit: Payer: Self-pay | Admitting: Family Medicine

## 2020-05-03 DIAGNOSIS — Z0000 Encounter for general adult medical examination without abnormal findings: Secondary | ICD-10-CM

## 2020-05-03 DIAGNOSIS — F4321 Adjustment disorder with depressed mood: Secondary | ICD-10-CM

## 2020-05-03 NOTE — Telephone Encounter (Signed)
Ok to refill??  Last office visit 02/18/2020.  Last refill 01/18/2020.  Ok to add refills to prescription?

## 2020-05-25 DIAGNOSIS — L570 Actinic keratosis: Secondary | ICD-10-CM | POA: Diagnosis not present

## 2020-05-30 ENCOUNTER — Other Ambulatory Visit: Payer: Self-pay | Admitting: Family Medicine

## 2020-06-27 ENCOUNTER — Other Ambulatory Visit (HOSPITAL_COMMUNITY): Payer: Self-pay | Admitting: Family Medicine

## 2020-06-27 DIAGNOSIS — Z1231 Encounter for screening mammogram for malignant neoplasm of breast: Secondary | ICD-10-CM

## 2020-08-12 ENCOUNTER — Ambulatory Visit (HOSPITAL_COMMUNITY)
Admission: RE | Admit: 2020-08-12 | Discharge: 2020-08-12 | Disposition: A | Discharge status: Home / Self Care | Payer: Medicare Other | Source: Ambulatory Visit | Attending: Family Medicine | Admitting: Family Medicine

## 2020-08-12 ENCOUNTER — Encounter (HOSPITAL_COMMUNITY): Payer: Self-pay

## 2020-08-12 DIAGNOSIS — Z1231 Encounter for screening mammogram for malignant neoplasm of breast: Secondary | ICD-10-CM | POA: Insufficient documentation

## 2020-08-12 DIAGNOSIS — M810 Age-related osteoporosis without current pathological fracture: Secondary | ICD-10-CM

## 2020-08-12 DIAGNOSIS — M8589 Other specified disorders of bone density and structure, multiple sites: Secondary | ICD-10-CM | POA: Diagnosis not present

## 2020-08-12 DIAGNOSIS — Z780 Asymptomatic menopausal state: Secondary | ICD-10-CM | POA: Diagnosis not present

## 2020-08-17 ENCOUNTER — Encounter: Payer: Self-pay | Admitting: *Deleted

## 2020-08-26 ENCOUNTER — Other Ambulatory Visit: Payer: Self-pay

## 2020-08-26 ENCOUNTER — Encounter: Payer: Self-pay | Admitting: Family Medicine

## 2020-08-26 ENCOUNTER — Ambulatory Visit (INDEPENDENT_AMBULATORY_CARE_PROVIDER_SITE_OTHER): Payer: Medicare Other | Admitting: Family Medicine

## 2020-08-26 VITALS — BP 128/64 | HR 92 | Temp 98.6°F | Resp 14 | Ht 65.0 in | Wt 132.0 lb

## 2020-08-26 DIAGNOSIS — Z136 Encounter for screening for cardiovascular disorders: Secondary | ICD-10-CM | POA: Diagnosis not present

## 2020-08-26 DIAGNOSIS — D6800 Von Willebrand disease, unspecified: Secondary | ICD-10-CM

## 2020-08-26 DIAGNOSIS — E7800 Pure hypercholesterolemia, unspecified: Secondary | ICD-10-CM

## 2020-08-26 DIAGNOSIS — M810 Age-related osteoporosis without current pathological fracture: Secondary | ICD-10-CM

## 2020-08-26 DIAGNOSIS — Z0000 Encounter for general adult medical examination without abnormal findings: Secondary | ICD-10-CM

## 2020-08-26 DIAGNOSIS — Z13220 Encounter for screening for lipoid disorders: Secondary | ICD-10-CM | POA: Diagnosis not present

## 2020-08-26 DIAGNOSIS — Z853 Personal history of malignant neoplasm of breast: Secondary | ICD-10-CM | POA: Diagnosis not present

## 2020-08-26 DIAGNOSIS — Z0001 Encounter for general adult medical examination with abnormal findings: Secondary | ICD-10-CM | POA: Diagnosis not present

## 2020-08-26 DIAGNOSIS — Z8673 Personal history of transient ischemic attack (TIA), and cerebral infarction without residual deficits: Secondary | ICD-10-CM | POA: Diagnosis not present

## 2020-08-26 NOTE — Progress Notes (Signed)
Subjective:    Patient ID: Caroline Valdez, female    DOB: 1951-02-17, 70 y.o.   MRN: [id number]  HPI Patient is a very sweet 70 year old Caucasian female here today for complete physical exam.  Her last mammogram was performed in March and was normal.  She also had a bone density test in March of this year which showed osteoporosis in her forearm but improved T-scores in her left and right hip.  She is due to receive her Prolia injection today for her osteoporosis.  Her last colonoscopy was in 2020 and was clear.  Therefore she does not require this again.  She does not require Pap smear due to age.  She denies any falls, memory loss, or depression.  Her immunizations are up-to-date except for her COVID booster which I encouraged her to get today Immunization History  Administered Date(s) Administered  . Fluad Quad(high Dose 65+) 02/13/2019  . Influenza Split 01/19/2014  . Influenza Whole 03/07/2009, 03/06/2010  . Influenza,inj,Quad PF,6+ Mos 01/26/2013, 02/17/2015, 01/28/2017, 02/06/2018  . Influenza-Unspecified 03/09/2016, 04/04/2020  . PFIZER(Purple Top)SARS-COV-2 Vaccination 06/25/2019, 07/16/2019  . Pneumococcal Conjugate-13 10/22/2014  . Pneumococcal Polysaccharide-23 09/07/2010, 03/23/2016  . Td 12/25/2007  . Tdap 12/25/2007, 04/17/2010, 12/11/2019  . Zoster 03/30/2010  . Zoster Recombinat (Shingrix) 07/23/2017, 10/22/2017    Past Medical History:  Diagnosis Date  . Blood dyscrasia   . Breast cancer ([place])    left  . Complication of anesthesia    1980S  TUBAL LIG, + HEMM SURGERY PT PASSED OUT AND WENT INTO SHOCK.  NO PROBLEMS SINCE   . 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 Endometriosis   . Fibromyalgia   . GERD (gastroesophageal reflux disease)   . Hypercholesteremia   . Mini stroke ([place]) 09/2012  . Radiculopathy   . Stroke [facility])    MINI STROKE  . Von Willebrand disease ([place]) 09/11/2013  . Von Willebrand's disease [facility])    history of   Past Surgical  History:  Procedure Laterality Date  . ABDOMINAL HYSTERECTOMY    . AUGMENTATION MAMMAPLASTY Right   . BACK SURGERY    . BREAST IMPLANT REMOVAL Right 12/05/2017  . BREAST IMPLANT REMOVAL Right 12/05/2017   Procedure: REMOVAL RIGHT  BREAST IMPLANT MATERIAL;  Surgeon: Crissie Reese, MD;  Location: [place];  Service: Plastics;  Laterality: Right;  . BREAST RECONSTRUCTION    . BREAST RECONSTRUCTION Right 12/05/2017   Procedure: DELAYED BREAST RECONSTRUCTION WITH SILICONE IMPLANT;  Surgeon: Crissie Reese, MD;  Location: [place];  Service: Plastics;  Laterality: Right;  . CARPAL TUNNEL RELEASE     right  . COLONOSCOPY  01/2010   Dr. Aviva Signs: normal  . COLONOSCOPY WITH PROPOFOL N/A 01/22/2019   Procedure: COLONOSCOPY WITH PROPOFOL;  Surgeon: Daneil Dolin, MD;  [facility];  Service: Endoscopy;  Laterality: N/A;  2:30pm - can not come earlier due to transportation  . FINGER SURGERY     LEFT THUMB  . HEMORRHOID SURGERY     X2  . LAMINECTOMY    . MASTECTOMY     left  . SHOULDER SURGERY    . SKIN CANCER EXCISION    . TONSILLECTOMY    . TUBAL LIGATION     Current Outpatient Medications on File Prior to Visit  Medication Sig Dispense Refill  . acetaminophen (TYLENOL) 500 MG tablet Take 1,000 mg by mouth daily as needed for headache.    Marland Kitchen acyclovir (ZOVIRAX) 400 MG tablet TAKE 1 TABLET(400  MG) BY MOUTH THREE TIMES DAILY AS NEEDED FOR COLD SORES 30 tablet 4  . ALPRAZolam (XANAX) 0.5 MG tablet TAKE 1 TABLET(0.5 MG) BY MOUTH AT BEDTIME AS NEEDED 60 tablet 3  . cholecalciferol (VITAMIN D) 1000 UNITS tablet Take 1,000 Units by mouth daily.    . cimetidine (TAGAMET) 400 MG tablet TAKE 1 TABLET TWICE A DAY AS NEEDED 180 tablet 3  . fluticasone (FLONASE) 50 MCG/ACT nasal spray Place 1 spray into both nostrils daily.    Marland Kitchen loratadine (CLARITIN) 10 MG tablet Take 10 mg by mouth daily.    . Multiple Vitamin (MULTIVITAMIN WITH MINERALS) TABS tablet Take 1 tablet by mouth daily.    .  pantoprazole (PROTONIX) 40 MG tablet TAKE 1 TABLET TWICE A DAY 180 tablet 3  . rosuvastatin (CRESTOR) 20 MG tablet TAKE 1 TABLET DAILY 90 tablet 3  . zinc sulfate 220 (50 Zn) MG capsule Take 220 mg by mouth daily.      Current Facility-Administered Medications on File Prior to Visit  Medication Dose Route Frequency Provider Last Rate Last Admin  . denosumab (PROLIA) injection 60 mg  60 mg Subcutaneous Once Susy Frizzle, MD       Allergies  Allergen Reactions  . Aspirin Other (See Comments)    Pt has Von Willebrands Disease  . Hydrocodone Itching  . Oxycodone Itching  . Tape Dermatitis    Pulls off skin   Social History   Socioeconomic History  . Marital status: Widowed    Spouse name: Not on file  . Number of children: Not on file  . Years of education: Not on file  . Highest education level: Not on file  Occupational History  . Not on file  Tobacco Use  . Smoking status: Never Smoker  . Smokeless tobacco: Never Used  Vaping Use  . Vaping Use: Never used  Substance and Sexual Activity  . Alcohol use: No  . Drug use: No  . Sexual activity: Not on file    Comment: husband recently died from myelodysplastic syndrome in January of 2014  Other Topics Concern  . Not on file  Social History Narrative  . Not on file   Social Determinants of Health   Financial Resource Strain: Not on file  Food Insecurity: Not on file  Transportation Needs: Not on file  Physical Activity: Not on file  Stress: Not on file  Social Connections: Not on file  Intimate Partner Violence: Not on file   Family History  Problem Relation Age of Onset  . Breast cancer Mother 54       now 15yo  . Stroke Father        Father had no full sisters  . Breast cancer Sister 76       now 53yo; had TAH/BSO  . Lung cancer Brother        died 53yo; smoker   Immunization History  Administered Date(s) Administered  . Fluad Quad(high Dose 65+) 02/13/2019  . Influenza Split 01/19/2014  . Influenza  Whole 03/07/2009, 03/06/2010  . Influenza,inj,Quad PF,6+ Mos 01/26/2013, 02/17/2015, 01/28/2017, 02/06/2018  . Influenza-Unspecified 03/09/2016, 04/04/2020  . PFIZER(Purple Top)SARS-COV-2 Vaccination 06/25/2019, 07/16/2019  . Pneumococcal Conjugate-13 10/22/2014  . Pneumococcal Polysaccharide-23 09/07/2010, 03/23/2016  . Td 12/25/2007  . Tdap 12/25/2007, 04/17/2010, 12/11/2019  . Zoster 03/30/2010  . Zoster Recombinat (Shingrix) 07/23/2017, 10/22/2017   Mammogram is up-to-date. Colonoscopy is up-to-date.  Does not require a Pap smear given the fact she's had a hysterectomy.  Review of Systems  All other systems reviewed and are negative.      Objective:   Physical Exam Vitals reviewed.  Constitutional:      General: She is not in acute distress.    Appearance: She is well-developed. She is not diaphoretic.  HENT:     Head: Normocephalic and atraumatic.     Right Ear: External ear normal.     Left Ear: External ear normal.     Nose: Nose normal.     Mouth/Throat:     Pharynx: No oropharyngeal exudate.  Eyes:     General: No scleral icterus.       Right eye: No discharge.        Left eye: No discharge.     Conjunctiva/sclera: Conjunctivae normal.     Pupils: Pupils are equal, round, and reactive to light.  Neck:     Thyroid: No thyromegaly.     Vascular: No JVD.     Trachea: No tracheal deviation.  Cardiovascular:     Rate and Rhythm: Normal rate and regular rhythm.     Heart sounds: Normal heart sounds. No murmur heard. No friction rub. No gallop.   Pulmonary:     Effort: Pulmonary effort is normal. No respiratory distress.     Breath sounds: Normal breath sounds. No stridor. No wheezing or rales.  Chest:     Chest wall: No tenderness.  Abdominal:     General: Bowel sounds are normal. There is no distension.     Palpations: Abdomen is soft.     Tenderness: There is no abdominal tenderness. There is no guarding or rebound.  Musculoskeletal:        General: No  tenderness. Normal range of motion.     Cervical back: Normal range of motion and neck supple.  Lymphadenopathy:     Cervical: No cervical adenopathy.  Skin:    General: Skin is warm.     Coloration: Skin is not pale.     Findings: No erythema or rash.  Neurological:     Mental Status: She is alert and oriented to person, place, and time.     Cranial Nerves: No cranial nerve deficit.     Motor: No abnormal muscle tone.     Coordination: Coordination normal.     Deep Tendon Reflexes: Reflexes normal.  Psychiatric:        Behavior: Behavior normal.        Thought Content: Thought content normal.        Judgment: Judgment normal.           Assessment & Plan:  History of TIA (transient ischemic attack) - Plan: CBC with Differential/Platelet, COMPLETE METABOLIC PANEL WITH GFR, Lipid panel  Osteoporosis, post-menopausal  Pure hypercholesterolemia  Von Willebrand disease ([place])  Personal history of breast cancer  General medical exam  Physical exam today is normal except for a cerumen impaction in her right ear which we removed with irrigation and lavage.  Immunizations are up-to-date except for her COVID booster which I encouraged her to get.  Mammogram is up-to-date.  Colonoscopy is up-to-date and not required again.  Pap smear is not due based on her age.  She denies any falls, depression, or memory loss.  Bone density test shows improvement in her T-scores in her hips.  She is still on Prolia.  I will check CBC, CMP, lipid panel and ideally I like to see her LDL cholesterol below 70.  Otherwise her physical exam today is  normal.

## 2020-08-27 LAB — CBC WITH DIFFERENTIAL/PLATELET
Absolute Monocytes: 713 cells/uL (ref 200–950)
Basophils Absolute: 31 cells/uL (ref 0–200)
Basophils Relative: 0.7 %
Eosinophils Absolute: 62 cells/uL (ref 15–500)
Eosinophils Relative: 1.4 %
HCT: 40.5 % (ref 35.0–45.0)
Hemoglobin: 13.7 g/dL (ref 11.7–15.5)
Lymphs Abs: 1408 cells/uL (ref 850–3900)
MCH: 31.7 pg (ref 27.0–33.0)
MCHC: 33.8 g/dL (ref 32.0–36.0)
MCV: 93.8 fL (ref 80.0–100.0)
MPV: 10.4 fL (ref 7.5–12.5)
Monocytes Relative: 16.2 %
Neutro Abs: 2187 cells/uL (ref 1500–7800)
Neutrophils Relative %: 49.7 %
Platelets: 173 10*3/uL (ref 140–400)
RBC: 4.32 10*6/uL (ref 3.80–5.10)
RDW: 12.5 % (ref 11.0–15.0)
Total Lymphocyte: 32 %
WBC: 4.4 10*3/uL (ref 3.8–10.8)

## 2020-08-27 LAB — LIPID PANEL
Cholesterol: 159 mg/dL (ref <200)
HDL: 66 mg/dL (ref >=50)
LDL Cholesterol (Calc): 77 mg/dL (calc)
Non-HDL Cholesterol (Calc): 93 mg/dL (calc) (ref <130)
Total CHOL/HDL Ratio: 2.4 (calc) (ref <5.0)
Triglycerides: 80 mg/dL (ref <150)

## 2020-08-27 LAB — COMPLETE METABOLIC PANEL WITH GFR
AG Ratio: 2 (calc) (ref 1.0–2.5)
ALT: 16 U/L (ref 6–29)
AST: 24 U/L (ref 10–35)
Albumin: 4.4 g/dL (ref 3.6–5.1)
Alkaline phosphatase (APISO): 41 U/L (ref 37–153)
BUN: 15 mg/dL (ref 7–25)
CO2: 29 mmol/L (ref 20–32)
Calcium: 9.3 mg/dL (ref 8.6–10.4)
Chloride: 105 mmol/L (ref 98–110)
Creat: 0.92 mg/dL (ref 0.60–0.93)
GFR, Est African American: 73 mL/min/{1.73_m2} (ref >=60)
GFR, Est Non African American: 63 mL/min/{1.73_m2} (ref >=60)
Globulin: 2.2 g/dL (calc) (ref 1.9–3.7)
Glucose, Bld: 90 mg/dL (ref 65–99)
Potassium: 4.3 mmol/L (ref 3.5–5.3)
Sodium: 142 mmol/L (ref 135–146)
Total Bilirubin: 0.7 mg/dL (ref 0.2–1.2)
Total Protein: 6.6 g/dL (ref 6.1–8.1)

## 2020-08-31 DIAGNOSIS — L570 Actinic keratosis: Secondary | ICD-10-CM | POA: Diagnosis not present

## 2020-09-01 ENCOUNTER — Other Ambulatory Visit: Payer: Self-pay | Admitting: Family Medicine

## 2020-09-18 ENCOUNTER — Encounter: Payer: Self-pay | Admitting: Family Medicine

## 2020-09-19 ENCOUNTER — Other Ambulatory Visit: Payer: Self-pay

## 2020-09-19 DIAGNOSIS — E7800 Pure hypercholesterolemia, unspecified: Secondary | ICD-10-CM

## 2020-11-12 ENCOUNTER — Other Ambulatory Visit: Payer: Self-pay | Admitting: Family Medicine

## 2020-11-23 DIAGNOSIS — C44311 Basal cell carcinoma of skin of nose: Secondary | ICD-10-CM | POA: Diagnosis not present

## 2020-12-21 ENCOUNTER — Other Ambulatory Visit: Payer: Self-pay | Admitting: Family Medicine

## 2020-12-21 DIAGNOSIS — F4321 Adjustment disorder with depressed mood: Secondary | ICD-10-CM

## 2020-12-21 DIAGNOSIS — Z0000 Encounter for general adult medical examination without abnormal findings: Secondary | ICD-10-CM

## 2020-12-21 NOTE — Telephone Encounter (Signed)
Ok to refill??  Last office visit 08/26/2020  Last refill 05/03/2020, #3 refill.

## 2021-01-09 DIAGNOSIS — H353132 Nonexudative age-related macular degeneration, bilateral, intermediate dry stage: Secondary | ICD-10-CM | POA: Diagnosis not present

## 2021-01-17 DIAGNOSIS — Z1152 Encounter for screening for COVID-19: Secondary | ICD-10-CM | POA: Diagnosis not present

## 2021-02-07 ENCOUNTER — Other Ambulatory Visit: Payer: Self-pay

## 2021-02-07 ENCOUNTER — Ambulatory Visit (INDEPENDENT_AMBULATORY_CARE_PROVIDER_SITE_OTHER): Payer: Medicare Other | Admitting: Family Medicine

## 2021-02-07 ENCOUNTER — Encounter: Payer: Self-pay | Admitting: Family Medicine

## 2021-02-07 VITALS — BP 124/62 | HR 86 | Temp 98.5°F | Resp 14 | Ht 65.0 in | Wt 135.0 lb

## 2021-02-07 DIAGNOSIS — J019 Acute sinusitis, unspecified: Secondary | ICD-10-CM | POA: Diagnosis not present

## 2021-02-07 DIAGNOSIS — B9689 Other specified bacterial agents as the cause of diseases classified elsewhere: Secondary | ICD-10-CM | POA: Diagnosis not present

## 2021-02-07 MED ORDER — AMOXICILLIN-POT CLAVULANATE 875-125 MG PO TABS: 1.0000 | ORAL_TABLET | Freq: Two times a day (BID) | ORAL | 0 refills | Status: DC

## 2021-02-07 NOTE — Progress Notes (Signed)
Subjective:    Patient ID: Caroline Valdez, female    DOB: 01/23/1951, 70 y.o.   MRN: [id number] Patient believes she has a sinus infection.  Symptoms began with a head cold more than a week ago.  However his settled in her face per her report.  She reports aching pain behind both eyes right greater than left and also aching pain in both cheekbones right greater than left.  She reports a headache.  She reports inability to breathe through her nose.  She reports severe congestion and subjective fevers.  She is taken to negative COVID test. Past Medical History:  Diagnosis Date   Blood dyscrasia    Breast cancer ([place])    left   Complication of anesthesia    1980S  TUBAL LIG, + HEMM SURGERY PT PASSED OUT AND WENT INTO SHOCK.  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    Endometriosis    Fibromyalgia    GERD (gastroesophageal reflux disease)    Hypercholesteremia    Mini stroke ([place]) 09/2012   Radiculopathy    Stroke ([place])    MINI STROKE   Von Willebrand disease ([place]) 09/11/2013   Von Willebrand's disease [facility])    history of   Past Surgical History:  Procedure Laterality Date   ABDOMINAL HYSTERECTOMY     AUGMENTATION MAMMAPLASTY Right    BACK SURGERY     BREAST IMPLANT REMOVAL Right 12/05/2017   BREAST IMPLANT REMOVAL Right 12/05/2017   Procedure: REMOVAL RIGHT  BREAST IMPLANT MATERIAL;  Surgeon: Crissie Reese, MD;  Location: [place];  Service: Plastics;  Laterality: Right;   BREAST RECONSTRUCTION     BREAST RECONSTRUCTION Right 12/05/2017   Procedure: DELAYED BREAST RECONSTRUCTION WITH SILICONE IMPLANT;  Surgeon: Crissie Reese, MD;  Location: [place];  Service: Plastics;  Laterality: Right;   CARPAL TUNNEL RELEASE     right   COLONOSCOPY  01/2010   Dr. Aviva Signs: normal   COLONOSCOPY WITH PROPOFOL N/A 01/22/2019   Procedure: COLONOSCOPY WITH PROPOFOL;  Surgeon: Daneil Dolin, MD;  [facility];  Service: Endoscopy;  Laterality: N/A;  2:30pm - can  not come earlier due to transportation   [place]     left   SHOULDER SURGERY     SKIN CANCER EXCISION     TONSILLECTOMY     TUBAL LIGATION     Current Outpatient Medications on File Prior to Visit  Medication Sig Dispense Refill   acetaminophen (TYLENOL) 500 MG tablet Take 1,000 mg by mouth daily as needed for headache.     acyclovir (ZOVIRAX) 400 MG tablet TAKE 1 TABLET(400 MG) BY MOUTH THREE TIMES DAILY AS NEEDED FOR COLD SORES 30 tablet 4   ALPRAZolam (XANAX) 0.5 MG tablet TAKE 1 TABLET(0.5 MG) BY MOUTH AT BEDTIME AS NEEDED 60 tablet 3   cholecalciferol (VITAMIN D) 1000 UNITS tablet Take 1,000 Units by mouth daily.     cimetidine (TAGAMET) 400 MG tablet TAKE 1 TABLET TWICE A DAY AS NEEDED 180 tablet 3   loratadine (CLARITIN) 10 MG tablet Take 10 mg by mouth daily.     Multiple Vitamin (MULTIVITAMIN WITH MINERALS) TABS tablet Take 1 tablet by mouth daily.     pantoprazole (PROTONIX) 40 MG tablet TAKE 1 TABLET TWICE A DAY 180 tablet 3   rosuvastatin (CRESTOR)  20 MG tablet TAKE 1 TABLET DAILY 90 tablet 3   zinc sulfate 220 (50 Zn) MG capsule Take 220 mg by mouth daily.      No current facility-administered medications on file prior to visit.   Allergies  Allergen Reactions   Aspirin Other (See Comments)    Pt has Von Willebrands Disease   Hydrocodone Itching   Oxycodone Itching   Tape Dermatitis    Pulls off skin   Social History   Socioeconomic History   Marital status: Widowed    Spouse name: Not on file   Number of children: Not on file   Years of education: Not on file   Highest education level: Not on file  Occupational History   Not on file  Tobacco Use   Smoking status: Never   Smokeless tobacco: Never  Vaping Use   Vaping Use: Never used  Substance and Sexual Activity   Alcohol use: No   Drug use: No   Sexual activity: Not on file    Comment: husband recently died from  myelodysplastic syndrome in January of 2014  Other Topics Concern   Not on file  Social History Narrative   Not on file   Social Determinants of Health   Financial Resource Strain: Not on file  Food Insecurity: Not on file  Transportation Needs: Not on file  Physical Activity: Not on file  Stress: Not on file  Social Connections: Not on file  Intimate Partner Violence: Not on file   Family History  Problem Relation Age of Onset   Breast cancer Mother 62       now 74yo   Stroke Father        Father had no full sisters   Breast cancer Sister 90       now 33yo; had TAH/BSO   Lung cancer Brother        died 98yo; smoker   Immunization History  Administered Date(s) Administered   Fluad Quad(high Dose 65+) 02/13/2019   Influenza Split 01/19/2014   Influenza Whole 03/07/2009, 03/06/2010   Influenza,inj,Quad PF,6+ Mos 01/26/2013, 02/17/2015, 01/28/2017, 02/06/2018   Influenza-Unspecified 03/09/2016, 04/04/2020   PFIZER(Purple Top)SARS-COV-2 Vaccination 06/25/2019, 07/16/2019   Pneumococcal Conjugate-13 10/22/2014   Pneumococcal Polysaccharide-23 09/07/2010, 03/23/2016   Td 12/25/2007   Tdap 12/25/2007, 04/17/2010, 12/11/2019   Zoster Recombinat (Shingrix) 07/23/2017, 10/22/2017   Zoster, Live 03/30/2010   Mammogram is up-to-date. Colonoscopy is up-to-date.  Does not require a Pap smear given the fact she's had a hysterectomy.    Review of Systems  All other systems reviewed and are negative.     Objective:   Physical Exam Vitals reviewed.  Constitutional:      General: She is not in acute distress.    Appearance: She is well-developed. She is not diaphoretic.  HENT:     Head: Normocephalic and atraumatic.     Right Ear: Tympanic membrane and ear canal normal.     Left Ear: Tympanic membrane and ear canal normal.     Nose:     Right Turbinates: Enlarged and swollen.     Left Turbinates: Enlarged and swollen.     Right Sinus: Maxillary sinus tenderness and frontal  sinus tenderness present.     Left Sinus: Maxillary sinus tenderness and frontal sinus tenderness present.  Eyes:     Conjunctiva/sclera: Conjunctivae normal.  Cardiovascular:     Rate and Rhythm: Normal rate and regular rhythm.     Heart sounds: Normal heart sounds. No murmur  heard.   No friction rub. No gallop.  Pulmonary:     Effort: Pulmonary effort is normal. No respiratory distress.     Breath sounds: Normal breath sounds. No wheezing or rales.  Chest:     Chest wall: No tenderness.          Assessment & Plan:  Acute bacterial rhinosinusitis Begin Augmentin 875 mg p.o. twice daily for 10 days.

## 2021-02-13 ENCOUNTER — Encounter: Payer: Self-pay | Admitting: Family Medicine

## 2021-03-01 DIAGNOSIS — L570 Actinic keratosis: Secondary | ICD-10-CM | POA: Diagnosis not present

## 2021-03-01 DIAGNOSIS — Z85828 Personal history of other malignant neoplasm of skin: Secondary | ICD-10-CM | POA: Diagnosis not present

## 2021-03-01 DIAGNOSIS — Z1283 Encounter for screening for malignant neoplasm of skin: Secondary | ICD-10-CM | POA: Diagnosis not present

## 2021-03-02 ENCOUNTER — Encounter: Payer: Self-pay | Admitting: Family Medicine

## 2021-03-02 ENCOUNTER — Ambulatory Visit: Payer: Medicare Other

## 2021-03-02 ENCOUNTER — Ambulatory Visit (INDEPENDENT_AMBULATORY_CARE_PROVIDER_SITE_OTHER): Payer: Medicare Other | Admitting: Family Medicine

## 2021-03-02 ENCOUNTER — Other Ambulatory Visit: Payer: Self-pay

## 2021-03-02 VITALS — BP 104/76 | HR 88 | Temp 97.6°F | Resp 12 | Ht 65.0 in | Wt 133.0 lb

## 2021-03-02 DIAGNOSIS — E7800 Pure hypercholesterolemia, unspecified: Secondary | ICD-10-CM

## 2021-03-02 DIAGNOSIS — M810 Age-related osteoporosis without current pathological fracture: Secondary | ICD-10-CM

## 2021-03-02 DIAGNOSIS — D6800 Von Willebrand disease, unspecified: Secondary | ICD-10-CM

## 2021-03-02 DIAGNOSIS — Z23 Encounter for immunization: Secondary | ICD-10-CM

## 2021-03-02 DIAGNOSIS — Z8673 Personal history of transient ischemic attack (TIA), and cerebral infarction without residual deficits: Secondary | ICD-10-CM | POA: Diagnosis not present

## 2021-03-02 MED ORDER — DENOSUMAB 60 MG/ML ~~LOC~~ SOSY
60.0000 mg | PREFILLED_SYRINGE | Freq: Once | SUBCUTANEOUS | Status: AC
  Administered 2021-03-02: 60 mg via SUBCUTANEOUS

## 2021-03-02 MED ORDER — MONTELUKAST SODIUM 10 MG PO TABS: 10.0000 mg | ORAL_TABLET | Freq: Every day | ORAL | 3 refills | Status: DC

## 2021-03-02 NOTE — Progress Notes (Signed)
Subjective:    Patient ID: Caroline Valdez, female    DOB: 1950/08/07, 70 y.o.   MRN: [id number]  HPI Patient is a very sweet 70 year old Caucasian female here today for a check up.  Her last mammogram was performed in March and was normal.  She also had a bone density test in March of this year which showed osteoporosis in her forearm but improved T-scores in her left and right hip.  Her blood work this spring was outstanding.  She continues to do extremely well.  She denies any concerns other than some sinusitis.  She has been taking Claritin on a daily basis.  She cannot tolerate Flonase because she was having epistaxis.  She continues to have postnasal drip head congestion and rhinorrhea.  She would like to try something different or additional if possible to help dry up the congestion and the sinus pressure.  Otherwise she is doing well.  She denies any chest pain shortness of breath or dyspnea on exertion   Past Medical History:  Diagnosis Date   Blood dyscrasia    Breast cancer ([facility])    left   Complication of anesthesia    1980S  TUBAL LIG, + HEMM SURGERY PT PASSED OUT AND WENT INTO SHOCK.  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    Endometriosis    Fibromyalgia    GERD (gastroesophageal reflux disease)    Hypercholesteremia    Mini stroke ([place]) 09/2012   Radiculopathy    Stroke ([place])    MINI STROKE   Von Willebrand disease ([place]) 09/11/2013   Von Willebrand's disease [facility])    history of   Past Surgical History:  Procedure Laterality Date   ABDOMINAL HYSTERECTOMY     AUGMENTATION MAMMAPLASTY Right    BACK SURGERY     BREAST IMPLANT REMOVAL Right 12/05/2017   BREAST IMPLANT REMOVAL Right 12/05/2017   Procedure: REMOVAL RIGHT  BREAST IMPLANT MATERIAL;  Surgeon: Crissie Reese, MD;  Location: [place];  Service: Plastics;  Laterality: Right;   BREAST RECONSTRUCTION     BREAST RECONSTRUCTION Right 12/05/2017   Procedure: DELAYED BREAST RECONSTRUCTION WITH  SILICONE IMPLANT;  Surgeon: Crissie Reese, MD;  Location: [place];  Service: Plastics;  Laterality: Right;   CARPAL TUNNEL RELEASE     right   COLONOSCOPY  01/2010   Dr. Aviva Signs: normal   COLONOSCOPY WITH PROPOFOL N/A 01/22/2019   Procedure: COLONOSCOPY WITH PROPOFOL;  Surgeon: Daneil Dolin, MD;  [facility];  Service: Endoscopy;  Laterality: N/A;  2:30pm - can not come earlier due to transportation   [place]     left   SHOULDER SURGERY     SKIN CANCER EXCISION     TONSILLECTOMY     TUBAL LIGATION     Current Outpatient Medications on File Prior to Visit  Medication Sig Dispense Refill   acetaminophen (TYLENOL) 500 MG tablet Take 1,000 mg by mouth daily as needed for headache.     acyclovir (ZOVIRAX) 400 MG tablet TAKE 1 TABLET(400 MG) BY MOUTH THREE TIMES DAILY AS NEEDED FOR COLD SORES 30 tablet 4   ALPRAZolam (XANAX) 0.5 MG tablet TAKE 1 TABLET(0.5 MG) BY MOUTH AT BEDTIME AS NEEDED 60 tablet 3   amoxicillin-clavulanate (AUGMENTIN) 875-125 MG tablet Take 1 tablet by mouth 2 (two) times  daily. 20 tablet 0   cholecalciferol (VITAMIN D) 1000 UNITS tablet Take 1,000 Units by mouth daily.     cimetidine (TAGAMET) 400 MG tablet TAKE 1 TABLET TWICE A DAY AS NEEDED 180 tablet 3   loratadine (CLARITIN) 10 MG tablet Take 10 mg by mouth daily.     Multiple Vitamin (MULTIVITAMIN WITH MINERALS) TABS tablet Take 1 tablet by mouth daily.     pantoprazole (PROTONIX) 40 MG tablet TAKE 1 TABLET TWICE A DAY 180 tablet 3   rosuvastatin (CRESTOR) 20 MG tablet TAKE 1 TABLET DAILY 90 tablet 3   zinc sulfate 220 (50 Zn) MG capsule Take 220 mg by mouth daily.      No current facility-administered medications on file prior to visit.   Allergies  Allergen Reactions   Aspirin Other (See Comments)    Pt has Von Willebrands Disease   Hydrocodone Itching   Oxycodone Itching   Tape Dermatitis    Pulls off skin    Social History   Socioeconomic History   Marital status: Widowed    Spouse name: Not on file   Number of children: Not on file   Years of education: Not on file   Highest education level: Not on file  Occupational History   Not on file  Tobacco Use   Smoking status: Never   Smokeless tobacco: Never  Vaping Use   Vaping Use: Never used  Substance and Sexual Activity   Alcohol use: No   Drug use: No   Sexual activity: Not on file    Comment: husband recently died from myelodysplastic syndrome in January of 2014  Other Topics Concern   Not on file  Social History Narrative   Not on file   Social Determinants of Health   Financial Resource Strain: Not on file  Food Insecurity: Not on file  Transportation Needs: Not on file  Physical Activity: Not on file  Stress: Not on file  Social Connections: Not on file  Intimate Partner Violence: Not on file   Family History  Problem Relation Age of Onset   Breast cancer Mother 49       now 40yo   Stroke Father        Father had no full sisters   Breast cancer Sister 47       now 88yo; had TAH/BSO   Lung cancer Brother        died 77yo; smoker   Immunization History  Administered Date(s) Administered   Fluad Quad(high Dose 65+) 02/13/2019   Influenza Split 01/19/2014   Influenza Whole 03/07/2009, 03/06/2010   Influenza,inj,Quad PF,6+ Mos 01/26/2013, 02/17/2015, 01/28/2017, 02/06/2018   Influenza-Unspecified 03/09/2016, 04/04/2020   PFIZER(Purple Top)SARS-COV-2 Vaccination 06/25/2019, 07/16/2019   Pneumococcal Conjugate-13 10/22/2014   Pneumococcal Polysaccharide-23 09/07/2010, 03/23/2016   Td 12/25/2007   Tdap 12/25/2007, 04/17/2010, 12/11/2019   Zoster Recombinat (Shingrix) 07/23/2017, 10/22/2017   Zoster, Live 03/30/2010   Mammogram is up-to-date. Colonoscopy is up-to-date.  Does not require a Pap smear given the fact she's had a hysterectomy.    Review of Systems  All other systems reviewed and are  negative.     Objective:   Physical Exam Vitals reviewed.  Constitutional:      General: She is not in acute distress.    Appearance: She is well-developed. She is not diaphoretic.  HENT:     Head: Normocephalic and atraumatic.     Right Ear: External ear normal.     Left Ear: External ear normal.  Nose: Nose normal.     Mouth/Throat:     Pharynx: No oropharyngeal exudate.  Eyes:     General: No scleral icterus.       Right eye: No discharge.        Left eye: No discharge.     Conjunctiva/sclera: Conjunctivae normal.     Pupils: Pupils are equal, round, and reactive to light.  Neck:     Thyroid: No thyromegaly.     Vascular: No JVD.     Trachea: No tracheal deviation.  Cardiovascular:     Rate and Rhythm: Normal rate and regular rhythm.     Heart sounds: Normal heart sounds. No murmur heard.   No friction rub. No gallop.  Pulmonary:     Effort: Pulmonary effort is normal. No respiratory distress.     Breath sounds: Normal breath sounds. No stridor. No wheezing or rales.  Chest:     Chest wall: No tenderness.  Abdominal:     General: Bowel sounds are normal. There is no distension.     Palpations: Abdomen is soft.     Tenderness: There is no abdominal tenderness. There is no guarding or rebound.  Musculoskeletal:        General: No tenderness. Normal range of motion.     Cervical back: Normal range of motion and neck supple.  Lymphadenopathy:     Cervical: No cervical adenopathy.  Skin:    General: Skin is warm.     Coloration: Skin is not pale.     Findings: No erythema or rash.  Neurological:     Mental Status: She is alert and oriented to person, place, and time.     Cranial Nerves: No cranial nerve deficit.     Motor: No abnormal muscle tone.     Coordination: Coordination normal.     Deep Tendon Reflexes: Reflexes normal.  Psychiatric:        Behavior: Behavior normal.        Thought Content: Thought content normal.        Judgment: Judgment normal.           Assessment & Plan:  Pure hypercholesterolemia - Plan: CBC with Differential/Platelet, COMPLETE METABOLIC PANEL WITH GFR, Lipid panel  History of TIA (transient ischemic attack) - Plan: CBC with Differential/Platelet, COMPLETE METABOLIC PANEL WITH GFR, Lipid panel  Von Willebrand disease  Osteoporosis, post-menopausal I will send Singulair to her pharmacy, 10 mg daily to see if this will help in addition to the Claritin.  Blood pressure today is outstanding.  Check CBC CMP and lipid panel.  Her blood work has been stable for quite some time.  Therefore I think we can transition to an annual checkup just to do physical exam.  She can come in anytime in between as needed for medical issues that arise.  She will also need to continue to come in every 6 months for Prolia injections.  She received a Prolia injection today as well as a flu shot

## 2021-03-02 NOTE — Addendum Note (Signed)
Addended by: Sheral Flow on: 03/02/2021 09:43 AM   Modules accepted: Orders

## 2021-03-03 LAB — COMPLETE METABOLIC PANEL WITH GFR
AG Ratio: 2 (calc) (ref 1.0–2.5)
ALT: 14 U/L (ref 6–29)
AST: 23 U/L (ref 10–35)
Albumin: 4.5 g/dL (ref 3.6–5.1)
Alkaline phosphatase (APISO): 41 U/L (ref 37–153)
BUN: 17 mg/dL (ref 7–25)
CO2: 30 mmol/L (ref 20–32)
Calcium: 9.4 mg/dL (ref 8.6–10.4)
Chloride: 104 mmol/L (ref 98–110)
Creat: 0.89 mg/dL (ref 0.60–1.00)
Globulin: 2.2 g/dL (calc) (ref 1.9–3.7)
Glucose, Bld: 91 mg/dL (ref 65–99)
Potassium: 4.4 mmol/L (ref 3.5–5.3)
Sodium: 140 mmol/L (ref 135–146)
Total Bilirubin: 0.7 mg/dL (ref 0.2–1.2)
Total Protein: 6.7 g/dL (ref 6.1–8.1)
eGFR: 69 mL/min/{1.73_m2} (ref >=60)

## 2021-03-03 LAB — LIPID PANEL
Cholesterol: 177 mg/dL (ref <200)
HDL: 66 mg/dL (ref >=50)
LDL Cholesterol (Calc): 91 mg/dL (calc)
Non-HDL Cholesterol (Calc): 111 mg/dL (calc) (ref <130)
Total CHOL/HDL Ratio: 2.7 (calc) (ref <5.0)
Triglycerides: 105 mg/dL (ref <150)

## 2021-03-03 LAB — CBC WITH DIFFERENTIAL/PLATELET
Absolute Monocytes: 603 cells/uL (ref 200–950)
Basophils Absolute: 31 cells/uL (ref 0–200)
Basophils Relative: 0.6 %
Eosinophils Absolute: 140 cells/uL (ref 15–500)
Eosinophils Relative: 2.7 %
HCT: 41.9 % (ref 35.0–45.0)
Hemoglobin: 13.7 g/dL (ref 11.7–15.5)
Lymphs Abs: 1732 cells/uL (ref 850–3900)
MCH: 30.7 pg (ref 27.0–33.0)
MCHC: 32.7 g/dL (ref 32.0–36.0)
MCV: 93.9 fL (ref 80.0–100.0)
MPV: 10.4 fL (ref 7.5–12.5)
Monocytes Relative: 11.6 %
Neutro Abs: 2694 cells/uL (ref 1500–7800)
Neutrophils Relative %: 51.8 %
Platelets: 198 10*3/uL (ref 140–400)
RBC: 4.46 10*6/uL (ref 3.80–5.10)
RDW: 12.5 % (ref 11.0–15.0)
Total Lymphocyte: 33.3 %
WBC: 5.2 10*3/uL (ref 3.8–10.8)

## 2021-05-10 ENCOUNTER — Other Ambulatory Visit: Payer: Self-pay | Admitting: Family Medicine

## 2021-05-12 ENCOUNTER — Ambulatory Visit: Payer: Medicare Other | Admitting: Family Medicine

## 2021-05-19 ENCOUNTER — Ambulatory Visit: Payer: Medicare Other | Admitting: Family Medicine

## 2021-05-25 DIAGNOSIS — Z20822 Contact with and (suspected) exposure to covid-19: Secondary | ICD-10-CM | POA: Diagnosis not present

## 2021-06-06 ENCOUNTER — Other Ambulatory Visit: Payer: Self-pay | Admitting: Family Medicine

## 2021-06-06 DIAGNOSIS — L570 Actinic keratosis: Secondary | ICD-10-CM | POA: Diagnosis not present

## 2021-06-18 ENCOUNTER — Other Ambulatory Visit: Payer: Self-pay | Admitting: Family Medicine

## 2021-06-19 ENCOUNTER — Ambulatory Visit (INDEPENDENT_AMBULATORY_CARE_PROVIDER_SITE_OTHER): Payer: Medicare Other | Admitting: Family Medicine

## 2021-06-19 ENCOUNTER — Encounter: Payer: Self-pay | Admitting: Family Medicine

## 2021-06-19 ENCOUNTER — Other Ambulatory Visit: Payer: Self-pay

## 2021-06-19 VITALS — BP 128/72 | HR 87 | Temp 97.3°F | Resp 18 | Ht 65.0 in | Wt 135.0 lb

## 2021-06-19 DIAGNOSIS — S0011XA Contusion of right eyelid and periocular area, initial encounter: Secondary | ICD-10-CM | POA: Diagnosis not present

## 2021-06-19 MED ORDER — BUTALBITAL-APAP-CAFFEINE 50-325-40 MG PO TABS: 1.0000 | ORAL_TABLET | Freq: Four times a day (QID) | ORAL | 0 refills | Status: DC | PRN

## 2021-06-19 NOTE — Progress Notes (Signed)
Subjective:    Patient ID: Caroline Valdez, female    DOB: Aug 18, 1950, 71 y.o.   MRN: [id number]  HPI Thursday, the patient tripped and fell.  She tried to catch her self with her left arm however he was unable to and she landed directly face first striking the concrete with the left side of her face and her forehead.  She has a yellow bruise above her right orbit.  She is extremely tender to palpation above the right orbit.  There is an abrasion on the right forehead above the eyebrow.  She is tender to palpation all along the eyebrow.  There is also some swelling in her right upper eyelid.  Extraocular movements are intact.  Vision is normal.  Pupils are equal, round, reactive to light.  She does report a headache.  She is more than 72 hours from the fall.  She denies any loss of consciousness, neurologic deficits, double vision, blurry vision, confusion, seizure-like activity. Past Medical History:  Diagnosis Date   Blood dyscrasia    Breast cancer ([place])    left   Complication of anesthesia    1980S  TUBAL LIG, + HEMM SURGERY PT PASSED OUT AND WENT INTO SHOCK.  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    Endometriosis    Fibromyalgia    GERD (gastroesophageal reflux disease)    Hypercholesteremia    Mini stroke 09/2012   Radiculopathy    Stroke [facility])    MINI STROKE   Von Willebrand disease 09/11/2013   Von Willebrand's disease    history of   Past Surgical History:  Procedure Laterality Date   ABDOMINAL HYSTERECTOMY     AUGMENTATION MAMMAPLASTY Right    BACK SURGERY     BREAST IMPLANT REMOVAL Right 12/05/2017   BREAST IMPLANT REMOVAL Right 12/05/2017   Procedure: REMOVAL RIGHT  BREAST IMPLANT MATERIAL;  Surgeon: Crissie Reese, MD;  Location: [place];  Service: Plastics;  Laterality: Right;   BREAST RECONSTRUCTION     BREAST RECONSTRUCTION Right 12/05/2017   Procedure: DELAYED BREAST RECONSTRUCTION WITH SILICONE IMPLANT;  Surgeon: Crissie Reese, MD;   Location: [place];  Service: Plastics;  Laterality: Right;   CARPAL TUNNEL RELEASE     right   COLONOSCOPY  01/2010   Dr. Aviva Signs: normal   COLONOSCOPY WITH PROPOFOL N/A 01/22/2019   Procedure: COLONOSCOPY WITH PROPOFOL;  Surgeon: Daneil Dolin, MD;  [facility];  Service: Endoscopy;  Laterality: N/A;  2:30pm - can not come earlier due to transportation   [place]     left   SHOULDER SURGERY     SKIN CANCER EXCISION     TONSILLECTOMY     TUBAL LIGATION     Current Outpatient Medications on File Prior to Visit  Medication Sig Dispense Refill   acetaminophen (TYLENOL) 500 MG tablet Take 1,000 mg by mouth daily as needed for headache.     acyclovir (ZOVIRAX) 400 MG tablet TAKE 1 TABLET(400 MG) BY MOUTH THREE TIMES DAILY AS NEEDED FOR COLD SORES 30 tablet 4   ALPRAZolam (XANAX) 0.5 MG tablet TAKE 1 TABLET(0.5 MG) BY MOUTH AT BEDTIME AS NEEDED 60 tablet 3   cholecalciferol (VITAMIN D) 1000 UNITS tablet Take 1,000 Units by mouth daily.     cimetidine (TAGAMET) 400 MG tablet TAKE 1  TABLET TWICE A DAY AS NEEDED 180 tablet 3   loratadine (CLARITIN) 10 MG tablet Take 10 mg by mouth daily.     montelukast (SINGULAIR) 10 MG tablet TAKE 1 TABLET(10 MG) BY MOUTH AT BEDTIME 90 tablet 3   Multiple Vitamin (MULTIVITAMIN WITH MINERALS) TABS tablet Take 1 tablet by mouth daily.     pantoprazole (PROTONIX) 40 MG tablet TAKE 1 TABLET TWICE A DAY 180 tablet 3   rosuvastatin (CRESTOR) 20 MG tablet TAKE 1 TABLET DAILY 90 tablet 3   zinc sulfate 220 (50 Zn) MG capsule Take 220 mg by mouth daily.      No current facility-administered medications on file prior to visit.   Allergies  Allergen Reactions   Aspirin Other (See Comments)    Pt has Von Willebrands Disease   Hydrocodone Itching   Oxycodone Itching   Tape Dermatitis    Pulls off skin   Social History   Socioeconomic History   Marital status:  Widowed    Spouse name: Not on file   Number of children: Not on file   Years of education: Not on file   Highest education level: Not on file  Occupational History   Not on file  Tobacco Use   Smoking status: Never   Smokeless tobacco: Never  Vaping Use   Vaping Use: Never used  Substance and Sexual Activity   Alcohol use: No   Drug use: No   Sexual activity: Not on file    Comment: husband recently died from myelodysplastic syndrome in January of 2014  Other Topics Concern   Not on file  Social History Narrative   Not on file   Social Determinants of Health   Financial Resource Strain: Not on file  Food Insecurity: Not on file  Transportation Needs: Not on file  Physical Activity: Not on file  Stress: Not on file  Social Connections: Not on file  Intimate Partner Violence: Not on file      Past Medical History:  Diagnosis Date   Blood dyscrasia    Breast cancer ([place])    left   Complication of anesthesia    1980S  TUBAL LIG, + HEMM SURGERY PT PASSED OUT AND WENT INTO SHOCK.  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    Endometriosis    Fibromyalgia    GERD (gastroesophageal reflux disease)    Hypercholesteremia    Mini stroke 09/2012   Radiculopathy    Stroke [facility])    MINI STROKE   Von Willebrand disease 09/11/2013   Von Willebrand's disease    history of   Past Surgical History:  Procedure Laterality Date   ABDOMINAL HYSTERECTOMY     AUGMENTATION MAMMAPLASTY Right    BACK SURGERY     BREAST IMPLANT REMOVAL Right 12/05/2017   BREAST IMPLANT REMOVAL Right 12/05/2017   Procedure: REMOVAL RIGHT  BREAST IMPLANT MATERIAL;  Surgeon: Crissie Reese, MD;  Location: [place];  Service: Plastics;  Laterality: Right;   BREAST RECONSTRUCTION     BREAST RECONSTRUCTION Right 12/05/2017   Procedure: DELAYED BREAST RECONSTRUCTION WITH SILICONE IMPLANT;  Surgeon: Crissie Reese, MD;  Location: [place];  Service: Plastics;  Laterality: Right;   CARPAL  TUNNEL RELEASE     right   COLONOSCOPY  01/2010   Dr. Aviva Signs: normal   COLONOSCOPY WITH PROPOFOL N/A 01/22/2019   Procedure: COLONOSCOPY WITH PROPOFOL;  Surgeon: Daneil Dolin, MD;  [facility];  Service:  Endoscopy;  Laterality: N/A;  2:30pm - can not come earlier due to transportation   [place]     left   SHOULDER SURGERY     SKIN CANCER EXCISION     TONSILLECTOMY     TUBAL LIGATION     Current Outpatient Medications on File Prior to Visit  Medication Sig Dispense Refill   acetaminophen (TYLENOL) 500 MG tablet Take 1,000 mg by mouth daily as needed for headache.     acyclovir (ZOVIRAX) 400 MG tablet TAKE 1 TABLET(400 MG) BY MOUTH THREE TIMES DAILY AS NEEDED FOR COLD SORES 30 tablet 4   ALPRAZolam (XANAX) 0.5 MG tablet TAKE 1 TABLET(0.5 MG) BY MOUTH AT BEDTIME AS NEEDED 60 tablet 3   cholecalciferol (VITAMIN D) 1000 UNITS tablet Take 1,000 Units by mouth daily.     cimetidine (TAGAMET) 400 MG tablet TAKE 1 TABLET TWICE A DAY AS NEEDED 180 tablet 3   loratadine (CLARITIN) 10 MG tablet Take 10 mg by mouth daily.     montelukast (SINGULAIR) 10 MG tablet TAKE 1 TABLET(10 MG) BY MOUTH AT BEDTIME 90 tablet 3   Multiple Vitamin (MULTIVITAMIN WITH MINERALS) TABS tablet Take 1 tablet by mouth daily.     pantoprazole (PROTONIX) 40 MG tablet TAKE 1 TABLET TWICE A DAY 180 tablet 3   rosuvastatin (CRESTOR) 20 MG tablet TAKE 1 TABLET DAILY 90 tablet 3   zinc sulfate 220 (50 Zn) MG capsule Take 220 mg by mouth daily.      No current facility-administered medications on file prior to visit.   Allergies  Allergen Reactions   Aspirin Other (See Comments)    Pt has Von Willebrands Disease   Hydrocodone Itching   Oxycodone Itching   Tape Dermatitis    Pulls off skin   Social History   Socioeconomic History   Marital status: Widowed    Spouse name: Not on file   Number of children: Not on file    Years of education: Not on file   Highest education level: Not on file  Occupational History   Not on file  Tobacco Use   Smoking status: Never   Smokeless tobacco: Never  Vaping Use   Vaping Use: Never used  Substance and Sexual Activity   Alcohol use: No   Drug use: No   Sexual activity: Not on file    Comment: husband recently died from myelodysplastic syndrome in January of 2014  Other Topics Concern   Not on file  Social History Narrative   Not on file   Social Determinants of Health   Financial Resource Strain: Not on file  Food Insecurity: Not on file  Transportation Needs: Not on file  Physical Activity: Not on file  Stress: Not on file  Social Connections: Not on file  Intimate Partner Violence: Not on file   Family History  Problem Relation Age of Onset   Breast cancer Mother 60       now 50yo   Stroke Father        Father had no full sisters   Breast cancer Sister 19       now 77yo; had TAH/BSO   Lung cancer Brother        died 29yo; smoker   Immunization History  Administered Date(s) Administered   Fluad Quad(high Dose 65+) 02/13/2019, 03/02/2021   Influenza Split  01/19/2014   Influenza Whole 03/07/2009, 03/06/2010   Influenza,inj,Quad PF,6+ Mos 01/26/2013, 02/17/2015, 01/28/2017, 02/06/2018   Influenza-Unspecified 03/09/2016, 04/04/2020   PFIZER(Purple Top)SARS-COV-2 Vaccination 06/25/2019, 07/16/2019   Pneumococcal Conjugate-13 10/22/2014   Pneumococcal Polysaccharide-23 09/07/2010, 03/23/2016   Td 12/25/2007   Tdap 12/25/2007, 04/17/2010, 12/11/2019   Zoster Recombinat (Shingrix) 07/23/2017, 10/22/2017   Zoster, Live 03/30/2010   Mammogram is up-to-date. Colonoscopy is up-to-date.  Does not require a Pap smear given the fact she's had a hysterectomy.    Review of Systems  All other systems reviewed and are negative.     Objective:   Physical Exam Vitals reviewed.  Constitutional:      General: She is not in acute distress.     Appearance: She is well-developed. She is not ill-appearing, toxic-appearing or diaphoretic.  HENT:     Head: Normocephalic. Abrasion and contusion present. No raccoon eyes.     Jaw: There is normal jaw occlusion.      Right Ear: Tympanic membrane, ear canal and external ear normal.     Left Ear: External ear normal.     Nose: Nose normal.     Mouth/Throat:     Pharynx: No oropharyngeal exudate.  Eyes:     General: No visual field deficit or scleral icterus.       Right eye: No discharge.        Left eye: No discharge.     Conjunctiva/sclera: Conjunctivae normal.     Pupils: Pupils are equal, round, and reactive to light.  Neck:     Thyroid: No thyromegaly.     Vascular: No JVD.     Trachea: No tracheal deviation.  Cardiovascular:     Rate and Rhythm: Normal rate and regular rhythm.     Heart sounds: Normal heart sounds. No murmur heard.   No friction rub. No gallop.  Pulmonary:     Effort: Pulmonary effort is normal. No respiratory distress.     Breath sounds: Normal breath sounds. No stridor. No wheezing or rales.  Chest:     Chest wall: No tenderness.  Abdominal:     General: Bowel sounds are normal. There is no distension.     Palpations: Abdomen is soft.     Tenderness: There is no abdominal tenderness. There is no guarding or rebound.  Musculoskeletal:        General: No tenderness. Normal range of motion.     Cervical back: Normal range of motion and neck supple.  Lymphadenopathy:     Cervical: No cervical adenopathy.  Skin:    General: Skin is warm.     Coloration: Skin is not pale.     Findings: No erythema or rash.  Neurological:     Mental Status: She is alert and oriented to person, place, and time. Mental status is at baseline.     Cranial Nerves: No cranial nerve deficit, dysarthria or facial asymmetry.     Sensory: Sensation is intact.     Motor: Motor function is intact. No weakness, tremor, atrophy, abnormal muscle tone or seizure activity.      Coordination: Coordination is intact. Coordination normal.     Gait: Gait is intact.     Deep Tendon Reflexes: Reflexes are normal and symmetric. Reflexes normal.  Psychiatric:        Behavior: Behavior normal.        Thought Content: Thought content normal.        Judgment: Judgment normal.  Assessment & Plan:  Contusion of right periocular region, initial encounter At the present time there is no evidence of increased intracranial pressure.  She is more than 72 hours out from the injury.  Therefore I do not feel that there is a dangerous intracranial hemorrhage although I cannot rule out small subdural hematoma.  She definitely has a contusion on the right forehead with significant bruising and swelling.  She is also having headache which I believe is due to concussion.  Recommended Fioricet 1 pill every 6 hours as needed for headache.  If worsening, recommend noncontrast CT scan.  Reassess in 48 hours if no better or sooner if worse

## 2021-07-04 ENCOUNTER — Encounter: Payer: Self-pay | Admitting: Family Medicine

## 2021-07-04 DIAGNOSIS — S0990XA Unspecified injury of head, initial encounter: Secondary | ICD-10-CM

## 2021-07-04 DIAGNOSIS — S0011XA Contusion of right eyelid and periocular area, initial encounter: Secondary | ICD-10-CM

## 2021-07-05 ENCOUNTER — Other Ambulatory Visit (HOSPITAL_COMMUNITY): Payer: Self-pay | Admitting: Family Medicine

## 2021-07-05 DIAGNOSIS — Z1231 Encounter for screening mammogram for malignant neoplasm of breast: Secondary | ICD-10-CM

## 2021-07-11 ENCOUNTER — Other Ambulatory Visit: Payer: Self-pay

## 2021-07-11 ENCOUNTER — Ambulatory Visit (HOSPITAL_COMMUNITY)
Admission: RE | Admit: 2021-07-11 | Discharge: 2021-07-11 | Disposition: A | Discharge status: Home / Self Care | Payer: Medicare Other | Source: Ambulatory Visit | Attending: Family Medicine | Admitting: Family Medicine

## 2021-07-11 DIAGNOSIS — R519 Headache, unspecified: Secondary | ICD-10-CM | POA: Diagnosis not present

## 2021-07-11 DIAGNOSIS — S0011XA Contusion of right eyelid and periocular area, initial encounter: Secondary | ICD-10-CM | POA: Diagnosis not present

## 2021-07-11 DIAGNOSIS — S0990XA Unspecified injury of head, initial encounter: Secondary | ICD-10-CM | POA: Diagnosis not present

## 2021-08-15 DIAGNOSIS — Z20822 Contact with and (suspected) exposure to covid-19: Secondary | ICD-10-CM | POA: Diagnosis not present

## 2021-08-28 ENCOUNTER — Other Ambulatory Visit: Payer: Self-pay | Admitting: Family Medicine

## 2021-08-30 ENCOUNTER — Ambulatory Visit (HOSPITAL_COMMUNITY)
Admission: RE | Admit: 2021-08-30 | Discharge: 2021-08-30 | Disposition: A | Discharge status: Home / Self Care | Payer: Medicare Other | Source: Ambulatory Visit | Attending: Family Medicine | Admitting: Family Medicine

## 2021-08-30 ENCOUNTER — Other Ambulatory Visit (HOSPITAL_COMMUNITY): Payer: Self-pay | Admitting: Family Medicine

## 2021-08-30 DIAGNOSIS — Z1231 Encounter for screening mammogram for malignant neoplasm of breast: Secondary | ICD-10-CM | POA: Diagnosis not present

## 2021-08-31 ENCOUNTER — Telehealth: Payer: Self-pay | Admitting: Family Medicine

## 2021-08-31 DIAGNOSIS — Z20822 Contact with and (suspected) exposure to covid-19: Secondary | ICD-10-CM | POA: Diagnosis not present

## 2021-08-31 NOTE — Telephone Encounter (Signed)
Left message for patient to call back and schedule Medicare Annual Wellness Visit (AWV) in office.  ? ?If not able to come in office, please offer to do virtually or by telephone.  Left office number and my jabber [phone]. ? ?Last AWV:08/26/2020 ? ?Please schedule at anytime with Nurse Health Advisor. ?  ?

## 2021-09-04 DIAGNOSIS — L570 Actinic keratosis: Secondary | ICD-10-CM | POA: Diagnosis not present

## 2021-09-08 ENCOUNTER — Ambulatory Visit (INDEPENDENT_AMBULATORY_CARE_PROVIDER_SITE_OTHER): Payer: Medicare Other

## 2021-09-08 VITALS — Ht 65.0 in | Wt 135.0 lb

## 2021-09-08 DIAGNOSIS — Z0000 Encounter for general adult medical examination without abnormal findings: Secondary | ICD-10-CM

## 2021-09-08 NOTE — Progress Notes (Signed)
? ?Subjective:  ? Caroline Valdez is a 71 y.o. female who presents for Medicare Annual (Subsequent) preventive examination. ?Virtual Visit via Telephone Note ? ?I connected with  Caroline Valdez on 09/08/21 at  3:00 PM EDT by telephone and verified that I am speaking with the correct person using two identifiers. ? ?Location: ?Patient: HOME ?Provider: [facility] ?Persons participating in the virtual visit: patient/Nurse Health Advisor ?  ?I discussed the limitations, risks, security and privacy concerns of performing an evaluation and management service by telephone and the availability of in person appointments. The patient expressed understanding and agreed to proceed. ? ?Interactive audio and video telecommunications were attempted between this nurse and patient, however failed, due to patient having technical difficulties OR patient did not have access to video capability.  We continued and completed visit with audio only. ? ?Some vital signs may be absent or patient reported.  ? ?Chriss Driver, LPN ? ?Review of Systems    ? ?Cardiac Risk Factors include: advanced age (>78mn, >>68women);dyslipidemia;sedentary lifestyle;Other (see comment), Risk factor comments: osteoporosis, fibromyalgia, bon williebrand dz ? ?   ?Objective:  ?  ?Today's Vitals  ? 09/08/21 1456  ?Weight: 135 lb (61.2 kg)  ?Height: '5\' 5"'$  (1.651 m)  ? ?Body mass index is 22.47 kg/m?. ? ? ?  09/08/2021  ?  3:03 PM 08/26/2020  ?  8:24 AM 01/22/2019  ? 12:24 PM 11/26/2018  ?  2:49 PM 11/12/2018  ?  3:07 PM 03/31/2018  ?  4:35 PM 12/05/2017  ?  6:03 AM  ?Advanced Directives  ?Does Patient Have a Medical Advance Directive? Yes No No No No No No  ?Type of [place]        ?Copy of [place]? No - copy requested        ?Would patient like information on creating a medical advance directive? No - Patient declined No - Patient declined No - Patient declined  No - Patient declined No -  Patient declined No - Patient declined  ? ? ?Current Medications (verified) ?Outpatient Encounter Medications as of 09/08/2021  ?Medication Sig  ? acetaminophen (TYLENOL) 500 MG tablet Take 1,000 mg by mouth daily as needed for headache.  ? acyclovir (ZOVIRAX) 400 MG tablet TAKE 1 TABLET(400 MG) BY MOUTH THREE TIMES DAILY AS NEEDED FOR COLD SORES  ? ALPRAZolam (XANAX) 0.5 MG tablet TAKE 1 TABLET(0.5 MG) BY MOUTH AT BEDTIME AS NEEDED  ? cholecalciferol (VITAMIN D) 1000 UNITS tablet Take 1,000 Units by mouth daily.  ? cimetidine (TAGAMET) 400 MG tablet TAKE 1 TABLET TWICE A DAY AS NEEDED  ? loratadine (CLARITIN) 10 MG tablet Take 10 mg by mouth daily.  ? montelukast (SINGULAIR) 10 MG tablet TAKE 1 TABLET(10 MG) BY MOUTH AT BEDTIME  ? Multiple Vitamin (MULTIVITAMIN WITH MINERALS) TABS tablet Take 1 tablet by mouth daily.  ? pantoprazole (PROTONIX) 40 MG tablet TAKE 1 TABLET TWICE A DAY  ? rosuvastatin (CRESTOR) 20 MG tablet TAKE 1 TABLET DAILY  ? zinc sulfate 220 (50 Zn) MG capsule Take 220 mg by mouth daily.   ? [DISCONTINUED] butalbital-acetaminophen-caffeine (FIORICET) 50-325-40 MG tablet Take 1 tablet by mouth every 6 (six) hours as needed for headache.  ? ?No facility-administered encounter medications on file as of 09/08/2021.  ? ? ?Allergies (verified) ?Aspirin, Hydrocodone, Oxycodone, and Tape  ? ?History: ?Past Medical History:  ?Diagnosis Date  ? Blood dyscrasia   ? Breast cancer (  [place])   ? left  ? Complication of anesthesia   ? 1980S  TUBAL LIG, + HEMM SURGERY PT PASSED OUT AND WENT INTO SHOCK.  NO PROBLEMS SINCE   ? Defective Cl/HCO3 exchange in ileum and colon   ? Endometriosis   ? Fibromyalgia   ? GERD (gastroesophageal reflux disease)   ? Hypercholesteremia   ? Mini stroke 09/2012  ? Radiculopathy   ? Stroke [facility])   ? MINI STROKE  ? Von Willebrand disease ([place]) 09/11/2013  ? Von Willebrand's disease [facility])   ? history of  ? ?Past Surgical History:  ?Procedure Laterality Date  ? ABDOMINAL HYSTERECTOMY    ?  AUGMENTATION MAMMAPLASTY Right   ? BACK SURGERY    ? BREAST IMPLANT REMOVAL Right 12/05/2017  ? BREAST IMPLANT REMOVAL Right 12/05/2017  ? Procedure: REMOVAL RIGHT  BREAST IMPLANT MATERIAL;  Surgeon: Crissie Reese, MD;  Location: [place];  Service: Plastics;  Laterality: Right;  ? BREAST RECONSTRUCTION    ? BREAST RECONSTRUCTION Right 12/05/2017  ? Procedure: DELAYED BREAST RECONSTRUCTION WITH SILICONE IMPLANT;  Surgeon: Crissie Reese, MD;  Location: [place];  Service: Plastics;  Laterality: Right;  ? CARPAL TUNNEL RELEASE    ? right  ? COLONOSCOPY  01/2010  ? Dr. Aviva Signs: normal  ? COLONOSCOPY WITH PROPOFOL N/A 01/22/2019  ? Procedure: COLONOSCOPY WITH PROPOFOL;  Surgeon: Daneil Dolin, MD;  [facility];  Service: Endoscopy;  Laterality: N/A;  2:30pm - can not come earlier due to transportation  ? FINGER SURGERY    ? LEFT THUMB  ? HEMORRHOID SURGERY    ? X2  ? LAMINECTOMY    ? MASTECTOMY    ? left  ? SHOULDER SURGERY    ? SKIN CANCER EXCISION    ? TONSILLECTOMY    ? TUBAL LIGATION    ? ?Family History  ?Problem Relation Age of Onset  ? Breast cancer Mother 61  ?     now 73yo  ? Stroke Father   ?     Father had no full sisters  ? Breast cancer Sister 76  ?     now 64yo; had TAH/BSO  ? Lung cancer Brother   ?     died 24yo; smoker  ? ?Social History  ? ?Socioeconomic History  ? Marital status: Widowed  ?  Spouse name: Not on file  ? Number of children: 1  ? Years of education: Not on file  ? Highest education level: Not on file  ?Occupational History  ? Not on file  ?Tobacco Use  ? Smoking status: Never  ? Smokeless tobacco: Never  ?Vaping Use  ? Vaping Use: Never used  ?Substance and Sexual Activity  ? Alcohol use: No  ? Drug use: No  ? Sexual activity: Not on file  ?  Comment: husband recently died from myelodysplastic syndrome in January of 2014  ?Other Topics Concern  ? Not on file  ?Social History Narrative  ? 1 Daughter, April  ? 1 grandson-20, 1 granddaughter-4.  ? Keeps granddaughter during the  week.  ? ?Social Determinants of Health  ? ?Financial Resource Strain: Low Risk   ? Difficulty of Paying Living Expenses: Not hard at all  ?Food Insecurity: No Food Insecurity  ? Worried About Charity fundraiser in the Last Year: Never true  ? Ran Out of Food in the Last Year: Never true  ?Transportation Needs: No Transportation Needs  ? Lack of Transportation (Medical): No  ?  Lack of Transportation (Non-Medical): No  ?Physical Activity: Sufficiently Active  ? Days of Exercise per Week: 5 days  ? Minutes of Exercise per Session: 40 min  ?Stress: No Stress Concern Present  ? Feeling of Stress : Not at all  ?Social Connections: Socially Integrated  ? Frequency of Communication with Friends and Family: More than three times a week  ? Frequency of Social Gatherings with Friends and Family: More than three times a week  ? Attends Religious Services: More than 4 times per year  ? Active Member of Clubs or Organizations: Yes  ? Attends Archivist Meetings: More than 4 times per year  ? Marital Status: Married  ? ? ?Tobacco Counseling ?Counseling given: Not Answered ? ? ?Clinical Intake: ? ?Pre-visit preparation completed: Yes ? ?Pain : No/denies pain ? ?  ? ?BMI - recorded: 22.47 ?Nutritional Status: BMI of 19-24  Normal ?Nutritional Risks: None ?Diabetes: No ? ?How often do you need to have someone help you when you read instructions, pamphlets, or other written materials from your doctor or pharmacy?: 1 - Never ? ?Diabetic?no ? ?Interpreter Needed?: No ? ?Information entered by :: mj Mattie Novosel, lpn ? ? ?Activities of Daily Living ? ?  09/08/2021  ?  3:09 PM  ?In your present state of health, do you have any difficulty performing the following activities:  ?Hearing? 0  ?Vision? 0  ?Difficulty concentrating or making decisions? 1  ?Comment memory at times.  ?Walking or climbing stairs? 0  ?Dressing or bathing? 0  ?Doing errands, shopping? 0  ?Preparing Food and eating ? N  ?Using the Toilet? N  ?In the past six  months, have you accidently leaked urine? N  ?Do you have problems with loss of bowel control? N  ?Managing your Medications? N  ?Managing your Finances? N  ?Housekeeping or managing your Housekeeping? N  ? ? ?

## 2021-09-08 NOTE — Patient Instructions (Signed)
[name] , ?Thank you for taking time to come for your Medicare Wellness Visit. I appreciate your ongoing commitment to your health goals. Please review the following plan we discussed and let me know if I can assist you in the future.  ? ?Screening recommendations/referrals: ?Colonoscopy: No longer required. Last done 01/22/2019. ?Mammogram:  Done 08/30/21. Repeat annually ? ?Bone Density: Done 08/12/20 Repeat every 2 years ? ?Recommended yearly ophthalmology/optometry visit for glaucoma screening and checkup ?Recommended yearly dental visit for hygiene and checkup ? ?Vaccinations: ?Influenza vaccine: Done 03/02/2021 Repeat annually ? ?Pneumococcal vaccine: Done 09/07/2010, 10/22/2014 and 03/23/2016 ?Tdap vaccine: Done 12/11/2019 Repeat in 10 years ? ?Shingles vaccine: Done 02/27/2010, 07/23/2017 and 10/22/2017   ?Covid-19:Done 06/25/2019 and 08/13/2019 ? ?Advanced directives: Please bring a copy of your health care power of attorney and living will to the office to be added to your chart at your convenience. ? ? ?Conditions/risks identified: KEEP UP THE GOOD WORK!! ? ?Next appointment: Follow up in one year for your annual wellness visit: Please call [phone] in March of 2024 to schedule. Thank you! ? ? ?Preventive Care 17 Years and Older, Female ?Preventive care refers to lifestyle choices and visits with your health care provider that can promote health and wellness. ?What does preventive care include? ?A yearly physical exam. This is also called an annual well check. ?Dental exams once or twice a year. ?Routine eye exams. Ask your health care provider how often you should have your eyes checked. ?Personal lifestyle choices, including: ?Daily care of your teeth and gums. ?Regular physical activity. ?Eating a healthy diet. ?Avoiding tobacco and drug use. ?Limiting alcohol use. ?Practicing safe sex. ?Taking low-dose aspirin every day. ?Taking vitamin and mineral supplements as recommended by your health care  provider. ?What happens during an annual well check? ?The services and screenings done by your health care provider during your annual well check will depend on your age, overall health, lifestyle risk factors, and family history of disease. ?Counseling  ?Your health care provider may ask you questions about your: ?Alcohol use. ?Tobacco use. ?Drug use. ?Emotional well-being. ?Home and relationship well-being. ?Sexual activity. ?Eating habits. ?History of falls. ?Memory and ability to understand (cognition). ?Work and work Statistician. ?Reproductive health. ?Screening  ?You may have the following tests or measurements: ?Height, weight, and BMI. ?Blood pressure. ?Lipid and cholesterol levels. These may be checked every 5 years, or more frequently if you are over 11 years old. ?Skin check. ?Lung cancer screening. You may have this screening every year starting at age 63 if you have a 30-pack-year history of smoking and currently smoke or have quit within the past 15 years. ?Fecal occult blood test (FOBT) of the stool. You may have this test every year starting at age 6. ?Flexible sigmoidoscopy or colonoscopy. You may have a sigmoidoscopy every 5 years or a colonoscopy every 10 years starting at age 66. ?Hepatitis C blood test. ?Hepatitis B blood test. ?Sexually transmitted disease (STD) testing. ?Diabetes screening. This is done by checking your blood sugar (glucose) after you have not eaten for a while (fasting). You may have this done every 1-3 years. ?Bone density scan. This is done to screen for osteoporosis. You may have this done starting at age 26. ?Mammogram. This may be done every 1-2 years. Talk to your health care provider about how often you should have regular mammograms. ?Talk with your health care provider about your test results, treatment options, and if necessary, the need for more tests. ?Vaccines  ?Your  health care provider may recommend certain vaccines, such as: ?Influenza vaccine. This is  recommended every year. ?Tetanus, diphtheria, and acellular pertussis (Tdap, Td) vaccine. You may need a Td booster every 10 years. ?Zoster vaccine. You may need this after age 30. ?Pneumococcal 13-valent conjugate (PCV13) vaccine. One dose is recommended after age 34. ?Pneumococcal polysaccharide (PPSV23) vaccine. One dose is recommended after age 15. ?Talk to your health care provider about which screenings and vaccines you need and how often you need them. ?This information is not intended to replace advice given to you by your health care provider. Make sure you discuss any questions you have with your health care provider. ?Document Released: 06/03/2015 Document Revised: 01/25/2016 Document Reviewed: 03/08/2015 ?Elsevier Interactive Patient Education ? 2017 [employer]. ? ?Fall Prevention in the Home ?Falls can cause injuries. They can happen to people of all ages. There are many things you can do to make your home safe and to help prevent falls. ?What can I do on the outside of my home? ?Regularly fix the edges of walkways and driveways and fix any cracks. ?Remove anything that might make you trip as you walk through a door, such as a raised step or threshold. ?Trim any bushes or trees on the path to your home. ?Use bright outdoor lighting. ?Clear any walking paths of anything that might make someone trip, such as rocks or tools. ?Regularly check to see if handrails are loose or broken. Make sure that both sides of any steps have handrails. ?Any raised decks and porches should have guardrails on the edges. ?Have any leaves, snow, or ice cleared regularly. ?Use sand or salt on walking paths during winter. ?Clean up any spills in your garage right away. This includes oil or grease spills. ?What can I do in the bathroom? ?Use night lights. ?Install grab bars by the toilet and in the tub and shower. Do not use towel bars as grab bars. ?Use non-skid mats or decals in the tub or shower. ?If you need to sit down in  the shower, use a plastic, non-slip stool. ?Keep the floor dry. Clean up any water that spills on the floor as soon as it happens. ?Remove soap buildup in the tub or shower regularly. ?Attach bath mats securely with double-sided non-slip rug tape. ?Do not have throw rugs and other things on the floor that can make you trip. ?What can I do in the bedroom? ?Use night lights. ?Make sure that you have a light by your bed that is easy to reach. ?Do not use any sheets or blankets that are too big for your bed. They should not hang down onto the floor. ?Have a firm chair that has side arms. You can use this for support while you get dressed. ?Do not have throw rugs and other things on the floor that can make you trip. ?What can I do in the kitchen? ?Clean up any spills right away. ?Avoid walking on wet floors. ?Keep items that you use a lot in easy-to-reach places. ?If you need to reach something above you, use a strong step stool that has a grab bar. ?Keep electrical cords out of the way. ?Do not use floor polish or wax that makes floors slippery. If you must use wax, use non-skid floor wax. ?Do not have throw rugs and other things on the floor that can make you trip. ?What can I do with my stairs? ?Do not leave any items on the stairs. ?Make sure that there are handrails  on both sides of the stairs and use them. Fix handrails that are broken or loose. Make sure that handrails are as long as the stairways. ?Check any carpeting to make sure that it is firmly attached to the stairs. Fix any carpet that is loose or worn. ?Avoid having throw rugs at the top or bottom of the stairs. If you do have throw rugs, attach them to the floor with carpet tape. ?Make sure that you have a light switch at the top of the stairs and the bottom of the stairs. If you do not have them, ask someone to add them for you. ?What else can I do to help prevent falls? ?Wear shoes that: ?Do not have high heels. ?Have rubber bottoms. ?Are comfortable  and fit you well. ?Are closed at the toe. Do not wear sandals. ?If you use a stepladder: ?Make sure that it is fully opened. Do not climb a closed stepladder. ?Make sure that both sides of the stepladder are locked into pl

## 2021-09-14 ENCOUNTER — Ambulatory Visit (INDEPENDENT_AMBULATORY_CARE_PROVIDER_SITE_OTHER): Payer: Medicare Other | Admitting: Family Medicine

## 2021-09-14 VITALS — BP 128/72 | HR 74 | Temp 97.7°F | Ht 65.0 in | Wt 136.0 lb

## 2021-09-14 DIAGNOSIS — E7800 Pure hypercholesterolemia, unspecified: Secondary | ICD-10-CM | POA: Diagnosis not present

## 2021-09-14 DIAGNOSIS — Z8673 Personal history of transient ischemic attack (TIA), and cerebral infarction without residual deficits: Secondary | ICD-10-CM | POA: Diagnosis not present

## 2021-09-14 DIAGNOSIS — D6800 Von Willebrand disease, unspecified: Secondary | ICD-10-CM | POA: Diagnosis not present

## 2021-09-14 DIAGNOSIS — Z853 Personal history of malignant neoplasm of breast: Secondary | ICD-10-CM | POA: Diagnosis not present

## 2021-09-14 DIAGNOSIS — M810 Age-related osteoporosis without current pathological fracture: Secondary | ICD-10-CM | POA: Diagnosis not present

## 2021-09-14 DIAGNOSIS — Z0000 Encounter for general adult medical examination without abnormal findings: Secondary | ICD-10-CM

## 2021-09-14 MED ORDER — DENOSUMAB 60 MG/ML ~~LOC~~ SOSY
60.0000 mg | PREFILLED_SYRINGE | Freq: Once | SUBCUTANEOUS | Status: AC
  Administered 2021-09-14: 60 mg via SUBCUTANEOUS

## 2021-09-14 MED ORDER — MONTELUKAST SODIUM 10 MG PO TABS: ORAL_TABLET | ORAL | 3 refills | Status: DC

## 2021-09-14 NOTE — Patient Instructions (Signed)
Given pt Prolia inj. Pt tol well. ?

## 2021-09-14 NOTE — Progress Notes (Signed)
? ?Subjective:  ? ? Patient ID: Caroline Valdez, female    DOB: [birth year], 71 y.o.   MRN: [id number] ? ?HPI ?Patient is a very sweet 71 year old Caucasian female here today for complete physical exam.  Mammogram 08/30/21 was normal.  She has a history of a hysterectomy and therefore does not require Pap smear.  Her last colonoscopy was in 2020 and was clear except for diverticulosis so she does not require repeat colonoscopy.  She had a bone density test in 2022 that showed osteoporosis.  She is taking Prolia every 6 months along with calcium and vitamin D.  She does require her next Prolia injection.  She did recently fall and suffered a concussion however she tripped.  She denies any issues with her balance.  The fall was due to a careless error.  She denies any memory loss or depression.  Her immunizations are up to date except for COVID booster which she politely declines. ?Immunization History  ?Administered Date(s) Administered  ? Fluad Quad(high Dose 65+) 02/13/2019, 03/02/2021  ? Influenza Split 01/19/2014  ? Influenza Whole 03/07/2009, 03/06/2010  ? Influenza,inj,Quad PF,6+ Mos 01/26/2013, 02/17/2015, 01/28/2017, 02/06/2018  ? Influenza-Unspecified 03/09/2016, 04/04/2020  ? PFIZER(Purple Top)SARS-COV-2 Vaccination 06/25/2019, 07/16/2019  ? Pneumococcal Conjugate-13 10/22/2014  ? Pneumococcal Polysaccharide-23 09/07/2010, 03/23/2016  ? Td 12/25/2007  ? Tdap 12/25/2007, 04/17/2010, 12/11/2019  ? Zoster Recombinat (Shingrix) 07/23/2017, 10/22/2017  ? Zoster, Live 03/30/2010  ? ? ?Past Medical History:  ?Diagnosis Date  ? Blood dyscrasia   ? Breast cancer ([place])   ? left  ? Complication of anesthesia   ? 1980S  TUBAL LIG, + HEMM SURGERY PT PASSED OUT AND WENT INTO SHOCK.  NO PROBLEMS SINCE   ? Defective Cl/HCO3 exchange in ileum and colon   ? Endometriosis   ? Fibromyalgia   ? GERD (gastroesophageal reflux disease)   ? Hypercholesteremia   ? Mini stroke 09/2012  ? Radiculopathy   ? Stroke [facility])   ? MINI STROKE  ?  Von Willebrand disease ([place]) 09/11/2013  ? Von Willebrand's disease [facility])   ? history of  ? ?Past Surgical History:  ?Procedure Laterality Date  ? ABDOMINAL HYSTERECTOMY    ? AUGMENTATION MAMMAPLASTY Right   ? BACK SURGERY    ? BREAST IMPLANT REMOVAL Right 12/05/2017  ? BREAST IMPLANT REMOVAL Right 12/05/2017  ? Procedure: REMOVAL RIGHT  BREAST IMPLANT MATERIAL;  Surgeon: Crissie Reese, MD;  Location: [place];  Service: Plastics;  Laterality: Right;  ? BREAST RECONSTRUCTION    ? BREAST RECONSTRUCTION Right 12/05/2017  ? Procedure: DELAYED BREAST RECONSTRUCTION WITH SILICONE IMPLANT;  Surgeon: Crissie Reese, MD;  Location: [place];  Service: Plastics;  Laterality: Right;  ? CARPAL TUNNEL RELEASE    ? right  ? COLONOSCOPY  01/2010  ? Dr. Aviva Signs: normal  ? COLONOSCOPY WITH PROPOFOL N/A 01/22/2019  ? Procedure: COLONOSCOPY WITH PROPOFOL;  Surgeon: Daneil Dolin, MD;  [facility];  Service: Endoscopy;  Laterality: N/A;  2:30pm - can not come earlier due to transportation  ? FINGER SURGERY    ? LEFT THUMB  ? HEMORRHOID SURGERY    ? X2  ? LAMINECTOMY    ? MASTECTOMY    ? left  ? SHOULDER SURGERY    ? SKIN CANCER EXCISION    ? TONSILLECTOMY    ? TUBAL LIGATION    ? ?Current Outpatient Medications on File Prior to Visit  ?Medication Sig Dispense Refill  ? acetaminophen (TYLENOL) 500 MG tablet Take  1,000 mg by mouth daily as needed for headache.    ? acyclovir (ZOVIRAX) 400 MG tablet TAKE 1 TABLET(400 MG) BY MOUTH THREE TIMES DAILY AS NEEDED FOR COLD SORES 30 tablet 4  ? ALPRAZolam (XANAX) 0.5 MG tablet TAKE 1 TABLET(0.5 MG) BY MOUTH AT BEDTIME AS NEEDED 60 tablet 3  ? cholecalciferol (VITAMIN D) 1000 UNITS tablet Take 1,000 Units by mouth daily.    ? cimetidine (TAGAMET) 400 MG tablet TAKE 1 TABLET TWICE A DAY AS NEEDED 180 tablet 3  ? loratadine (CLARITIN) 10 MG tablet Take 10 mg by mouth daily.    ? montelukast (SINGULAIR) 10 MG tablet TAKE 1 TABLET(10 MG) BY MOUTH AT BEDTIME 90 tablet 3  ? Multiple Vitamin  (MULTIVITAMIN WITH MINERALS) TABS tablet Take 1 tablet by mouth daily.    ? pantoprazole (PROTONIX) 40 MG tablet TAKE 1 TABLET TWICE A DAY 180 tablet 3  ? rosuvastatin (CRESTOR) 20 MG tablet TAKE 1 TABLET DAILY 90 tablet 3  ? zinc sulfate 220 (50 Zn) MG capsule Take 220 mg by mouth daily.     ? ?No current facility-administered medications on file prior to visit.  ? ?Allergies  ?Allergen Reactions  ? Aspirin Other (See Comments)  ?  Pt has [place] Disease  ? Hydrocodone Itching  ? Oxycodone Itching  ? Tape Dermatitis  ?  Pulls off skin  ? ?Social History  ? ?Socioeconomic History  ? Marital status: Widowed  ?  Spouse name: Not on file  ? Number of children: 1  ? Years of education: Not on file  ? Highest education level: Not on file  ?Occupational History  ? Not on file  ?Tobacco Use  ? Smoking status: Never  ? Smokeless tobacco: Never  ?Vaping Use  ? Vaping Use: Never used  ?Substance and Sexual Activity  ? Alcohol use: No  ? Drug use: No  ? Sexual activity: Not on file  ?  Comment: husband recently died from myelodysplastic syndrome in January of 2014  ?Other Topics Concern  ? Not on file  ?Social History Narrative  ? 1 Daughter, Caroline Valdez  ? 1 grandson-20, 1 granddaughter-4.  ? Keeps granddaughter during the week.  ? ?Social Determinants of Health  ? ?Financial Resource Strain: Low Risk   ? Difficulty of Paying Living Expenses: Not hard at all  ?Food Insecurity: No Food Insecurity  ? Worried About Charity fundraiser in the Last Year: Never true  ? Ran Out of Food in the Last Year: Never true  ?Transportation Needs: No Transportation Needs  ? Lack of Transportation (Medical): No  ? Lack of Transportation (Non-Medical): No  ?Physical Activity: Sufficiently Active  ? Days of Exercise per Week: 5 days  ? Minutes of Exercise per Session: 40 min  ?Stress: No Stress Concern Present  ? Feeling of Stress : Not at all  ?Social Connections: Socially Integrated  ? Frequency of Communication with Friends and Family: More  than three times a week  ? Frequency of Social Gatherings with Friends and Family: More than three times a week  ? Attends Religious Services: More than 4 times per year  ? Active Member of Clubs or Organizations: Yes  ? Attends Archivist Meetings: More than 4 times per year  ? Marital Status: Married  ?Intimate Partner Violence: Not At Risk  ? Fear of Current or Ex-Partner: No  ? Emotionally Abused: No  ? Physically Abused: No  ? Sexually Abused: No  ? ?Family  History  ?Problem Relation Age of Onset  ? Breast cancer Mother 50  ?     now 1yo  ? Stroke Father   ?     Father had no full sisters  ? Breast cancer Sister 33  ?     now 17yo; had TAH/BSO  ? Lung cancer Brother   ?     died 10yo; smoker  ? ?Immunization History  ?Administered Date(s) Administered  ? Fluad Quad(high Dose 65+) 02/13/2019, 03/02/2021  ? Influenza Split 01/19/2014  ? Influenza Whole 03/07/2009, 03/06/2010  ? Influenza,inj,Quad PF,6+ Mos 01/26/2013, 02/17/2015, 01/28/2017, 02/06/2018  ? Influenza-Unspecified 03/09/2016, 04/04/2020  ? PFIZER(Purple Top)SARS-COV-2 Vaccination 06/25/2019, 07/16/2019  ? Pneumococcal Conjugate-13 10/22/2014  ? Pneumococcal Polysaccharide-23 09/07/2010, 03/23/2016  ? Td 12/25/2007  ? Tdap 12/25/2007, 04/17/2010, 12/11/2019  ? Zoster Recombinat (Shingrix) 07/23/2017, 10/22/2017  ? Zoster, Live 03/30/2010  ? ?Mammogram is up-to-date. Colonoscopy is up-to-date.  Does not require a Pap smear given the fact she's had a hysterectomy. ? ? ? ?Review of Systems  ?All other systems reviewed and are negative. ? ?   ?Objective:  ? Physical Exam ?Vitals reviewed.  ?Constitutional:   ?   General: She is not in acute distress. ?   Appearance: She is well-developed. She is not diaphoretic.  ?HENT:  ?   Head: Normocephalic and atraumatic.  ?   Right Ear: External ear normal.  ?   Left Ear: External ear normal.  ?   Nose: Nose normal.  ?   Mouth/Throat:  ?   Pharynx: No oropharyngeal exudate.  ?Eyes:  ?   General: No  scleral icterus.    ?   Right eye: No discharge.     ?   Left eye: No discharge.  ?   Conjunctiva/sclera: Conjunctivae normal.  ?   Pupils: Pupils are equal, round, and reactive to light.  ?Neck:  ?   Thyroid

## 2021-09-15 LAB — CBC WITH DIFFERENTIAL/PLATELET
Absolute Monocytes: 607 cells/uL (ref 200–950)
Basophils Absolute: 31 cells/uL (ref 0–200)
Basophils Relative: 0.6 %
Eosinophils Absolute: 92 cells/uL (ref 15–500)
Eosinophils Relative: 1.8 %
HCT: 42.1 % (ref 35.0–45.0)
Hemoglobin: 14.2 g/dL (ref 11.7–15.5)
Lymphs Abs: 1719 cells/uL (ref 850–3900)
MCH: 32.1 pg (ref 27.0–33.0)
MCHC: 33.7 g/dL (ref 32.0–36.0)
MCV: 95 fL (ref 80.0–100.0)
MPV: 11 fL (ref 7.5–12.5)
Monocytes Relative: 11.9 %
Neutro Abs: 2652 cells/uL (ref 1500–7800)
Neutrophils Relative %: 52 %
Platelets: 196 10*3/uL (ref 140–400)
RBC: 4.43 10*6/uL (ref 3.80–5.10)
RDW: 12.4 % (ref 11.0–15.0)
Total Lymphocyte: 33.7 %
WBC: 5.1 10*3/uL (ref 3.8–10.8)

## 2021-09-15 LAB — COMPLETE METABOLIC PANEL WITH GFR
AG Ratio: 1.9 (calc) (ref 1.0–2.5)
ALT: 15 U/L (ref 6–29)
AST: 22 U/L (ref 10–35)
Albumin: 4.5 g/dL (ref 3.6–5.1)
Alkaline phosphatase (APISO): 39 U/L (ref 37–153)
BUN: 14 mg/dL (ref 7–25)
CO2: 31 mmol/L (ref 20–32)
Calcium: 9.6 mg/dL (ref 8.6–10.4)
Chloride: 105 mmol/L (ref 98–110)
Creat: 0.91 mg/dL (ref 0.60–1.00)
Globulin: 2.4 g/dL (calc) (ref 1.9–3.7)
Glucose, Bld: 92 mg/dL (ref 65–99)
Potassium: 4.7 mmol/L (ref 3.5–5.3)
Sodium: 142 mmol/L (ref 135–146)
Total Bilirubin: 0.8 mg/dL (ref 0.2–1.2)
Total Protein: 6.9 g/dL (ref 6.1–8.1)
eGFR: 67 mL/min/{1.73_m2} (ref >=60)

## 2021-09-15 LAB — LIPID PANEL
Cholesterol: 176 mg/dL (ref <200)
HDL: 67 mg/dL (ref >=50)
LDL Cholesterol (Calc): 89 mg/dL (calc)
Non-HDL Cholesterol (Calc): 109 mg/dL (calc) (ref <130)
Total CHOL/HDL Ratio: 2.6 (calc) (ref <5.0)
Triglycerides: 102 mg/dL (ref <150)

## 2021-09-18 DIAGNOSIS — Z20822 Contact with and (suspected) exposure to covid-19: Secondary | ICD-10-CM | POA: Diagnosis not present

## 2021-10-24 ENCOUNTER — Telehealth: Payer: Self-pay | Admitting: Family Medicine

## 2021-10-24 NOTE — Telephone Encounter (Signed)
Patient has received a bill for DOS 09/14/2021 for a CPE. Her insurance has denied this since she had an AWV on 09/08/21 with our Health Nurse Advisor. Can you please look at the [facility] 09/14/2021 and see if it can be changed to an office visit instead of a CPE since per her insurance she has already had that this year. She has traditional Medicare Part B and they don't cover a CPE.  Please advise.  If we can change to an office visit please document the change in the visit.

## 2021-10-26 ENCOUNTER — Other Ambulatory Visit: Payer: Self-pay | Admitting: Family Medicine

## 2021-10-26 DIAGNOSIS — F4321 Adjustment disorder with depressed mood: Secondary | ICD-10-CM

## 2021-10-26 DIAGNOSIS — Z0000 Encounter for general adult medical examination without abnormal findings: Secondary | ICD-10-CM

## 2021-10-29 NOTE — Addendum Note (Signed)
Addended by: Jenna Luo T on: 10/29/2021 03:29 PM   Modules accepted: Level of Service

## 2021-11-06 ENCOUNTER — Encounter: Payer: Self-pay | Admitting: Family Medicine

## 2021-11-06 ENCOUNTER — Ambulatory Visit (INDEPENDENT_AMBULATORY_CARE_PROVIDER_SITE_OTHER): Payer: Medicare Other | Admitting: Family Medicine

## 2021-11-06 VITALS — BP 124/72 | HR 95 | Temp 98.7°F | Ht 65.0 in | Wt 134.0 lb

## 2021-11-06 DIAGNOSIS — M2550 Pain in unspecified joint: Secondary | ICD-10-CM | POA: Diagnosis not present

## 2021-11-06 MED ORDER — TRAMADOL HCL 50 MG PO TABS: 50.0000 mg | ORAL_TABLET | Freq: Three times a day (TID) | ORAL | 0 refills | Status: AC | PRN

## 2021-11-06 MED ORDER — MELOXICAM 15 MG PO TABS: 15.0000 mg | ORAL_TABLET | Freq: Every day | ORAL | 0 refills | Status: DC

## 2021-11-06 NOTE — Progress Notes (Signed)
Subjective:    Patient ID: Caroline Valdez, female    DOB: 02-Apr-1951, 71 y.o.   MRN: [id number]  HPI Patient reports diffuse joint pain.  She reports pain over the medial aspects of both knees, she reports pain in the posterior both hips and in her lower back.  She reports pain in her elbows and her forearms.  She states that the joints are sore to touch.  However the joints are not erythematous or warm or swollen.  There is no crepitus.  There is normal range of motion.  Essentially she is sore all over her body.  She questions if this could be her fibromyalgia. Past Medical History:  Diagnosis Date   Blood dyscrasia    Breast cancer ([place])    left   Complication of anesthesia    1980S  TUBAL LIG, + HEMM SURGERY PT PASSED OUT AND WENT INTO SHOCK.  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    Endometriosis    Fibromyalgia    GERD (gastroesophageal reflux disease)    Hypercholesteremia    Mini stroke 09/2012   Radiculopathy    Stroke [facility])    MINI STROKE   Von Willebrand disease ([place]) 09/11/2013   Von Willebrand's disease [facility])    history of   Past Surgical History:  Procedure Laterality Date   ABDOMINAL HYSTERECTOMY     AUGMENTATION MAMMAPLASTY Right    BACK SURGERY     BREAST IMPLANT REMOVAL Right 12/05/2017   BREAST IMPLANT REMOVAL Right 12/05/2017   Procedure: REMOVAL RIGHT  BREAST IMPLANT MATERIAL;  Surgeon: Crissie Reese, MD;  Location: [place];  Service: Plastics;  Laterality: Right;   BREAST RECONSTRUCTION     BREAST RECONSTRUCTION Right 12/05/2017   Procedure: DELAYED BREAST RECONSTRUCTION WITH SILICONE IMPLANT;  Surgeon: Crissie Reese, MD;  Location: [place];  Service: Plastics;  Laterality: Right;   CARPAL TUNNEL RELEASE     right   COLONOSCOPY  01/2010   Dr. Aviva Signs: normal   COLONOSCOPY WITH PROPOFOL N/A 01/22/2019   Procedure: COLONOSCOPY WITH PROPOFOL;  Surgeon: Daneil Dolin, MD;  [facility];  Service: Endoscopy;   Laterality: N/A;  2:30pm - can not come earlier due to transportation   [place]     left   SHOULDER SURGERY     SKIN CANCER EXCISION     TONSILLECTOMY     TUBAL LIGATION     Current Outpatient Medications on File Prior to Visit  Medication Sig Dispense Refill   acetaminophen (TYLENOL) 500 MG tablet Take 1,000 mg by mouth daily as needed for headache.     acyclovir (ZOVIRAX) 400 MG tablet TAKE 1 TABLET(400 MG) BY MOUTH THREE TIMES DAILY AS NEEDED FOR COLD SORES 30 tablet 4   ALPRAZolam (XANAX) 0.5 MG tablet TAKE 1 TABLET(0.5 MG) BY MOUTH AT BEDTIME AS NEEDED 60 tablet 2   cholecalciferol (VITAMIN D) 1000 UNITS tablet Take 1,000 Units by mouth daily.     cimetidine (TAGAMET) 400 MG tablet TAKE 1 TABLET TWICE A DAY AS NEEDED 180 tablet 3   loratadine (CLARITIN) 10 MG tablet Take 10 mg by mouth daily.     montelukast (SINGULAIR) 10 MG tablet TAKE 1 TABLET(10 MG) BY MOUTH AT BEDTIME 90 tablet 3   Multiple Vitamin (MULTIVITAMIN WITH MINERALS) TABS tablet Take  1 tablet by mouth daily.     pantoprazole (PROTONIX) 40 MG tablet TAKE 1 TABLET TWICE A DAY 180 tablet 3   rosuvastatin (CRESTOR) 20 MG tablet TAKE 1 TABLET DAILY 90 tablet 3   zinc sulfate 220 (50 Zn) MG capsule Take 220 mg by mouth daily.      No current facility-administered medications on file prior to visit.   Allergies  Allergen Reactions   Aspirin Other (See Comments)    Pt has Von Willebrands Disease   Hydrocodone Itching   Oxycodone Itching   Tape Dermatitis    Pulls off skin   Social History   Socioeconomic History   Marital status: Widowed    Spouse name: Not on file   Number of children: 1   Years of education: Not on file   Highest education level: Not on file  Occupational History   Not on file  Tobacco Use   Smoking status: Never   Smokeless tobacco: Never  Vaping Use   Vaping Use: Never used  Substance and Sexual Activity    Alcohol use: No   Drug use: No   Sexual activity: Not on file    Comment: husband recently died from myelodysplastic syndrome in January of 2014  Other Topics Concern   Not on file  Social History Narrative   1 Daughter, April   1 grandson-20, 1 granddaughter-4.   Keeps granddaughter during the week.   Social Determinants of Health   Financial Resource Strain: Low Risk  (09/08/2021)   Overall Financial Resource Strain (CARDIA)    Difficulty of Paying Living Expenses: Not hard at all  Food Insecurity: No Food Insecurity (09/08/2021)   Hunger Vital Sign    Worried About Running Out of Food in the Last Year: Never true    Ran Out of Food in the Last Year: Never true  Transportation Needs: No Transportation Needs (09/08/2021)   PRAPARE - Hydrologist (Medical): No    Lack of Transportation (Non-Medical): No  Physical Activity: Sufficiently Active (09/08/2021)   Exercise Vital Sign    Days of Exercise per Week: 5 days    Minutes of Exercise per Session: 40 min  Stress: No Stress Concern Present (09/08/2021)   [place]    Feeling of Stress : Not at all  Social Connections: [place] (09/08/2021)   Social Connection and Isolation Panel [NHANES]    Frequency of Communication with Friends and Family: More than three times a week    Frequency of Social Gatherings with Friends and Family: More than three times a week    Attends Religious Services: More than 4 times per year    Active Member of [employer] or Organizations: Yes    Attends Music therapist: More than 4 times per year    Marital Status: Married  Human resources officer Violence: Not At Risk (09/08/2021)   Humiliation, Afraid, Rape, and Kick questionnaire    Fear of Current or Ex-Partner: No    Emotionally Abused: No    Physically Abused: No    Sexually Abused: No   Family History  Problem Relation Age of Onset    Breast cancer Mother 22       now 107yo   Stroke Father        Father had no full sisters   Breast cancer Sister 74       now 92yo; had TAH/BSO   Lung cancer Brother  died 64yo; smoker   Immunization History  Administered Date(s) Administered   Fluad Quad(high Dose 65+) 02/13/2019, 03/02/2021   Influenza Split 01/19/2014   Influenza Whole 03/07/2009, 03/06/2010   Influenza,inj,Quad PF,6+ Mos 01/26/2013, 02/17/2015, 01/28/2017, 02/06/2018   Influenza-Unspecified 03/09/2016, 04/04/2020   PFIZER(Purple Top)SARS-COV-2 Vaccination 06/25/2019, 07/16/2019   Pneumococcal Conjugate-13 10/22/2014   Pneumococcal Polysaccharide-23 09/07/2010, 03/23/2016   Td 12/25/2007   Tdap 12/25/2007, 04/17/2010, 12/11/2019   Zoster Recombinat (Shingrix) 07/23/2017, 10/22/2017   Zoster, Live 03/30/2010   Mammogram is up-to-date. Colonoscopy is up-to-date.  Does not require a Pap smear given the fact she's had a hysterectomy.    Review of Systems  All other systems reviewed and are negative.      Objective:   Physical Exam Vitals reviewed.  Constitutional:      General: She is not in acute distress.    Appearance: She is well-developed. She is not diaphoretic.  HENT:     Head: Normocephalic and atraumatic.  Neck:     Thyroid: No thyromegaly.     Vascular: No JVD.     Trachea: No tracheal deviation.  Cardiovascular:     Rate and Rhythm: Normal rate and regular rhythm.     Heart sounds: Normal heart sounds. No murmur heard.    No friction rub. No gallop.  Pulmonary:     Effort: Pulmonary effort is normal. No respiratory distress.     Breath sounds: Normal breath sounds. No stridor. No wheezing or rales.  Chest:     Chest wall: No tenderness.  Musculoskeletal:        General: Normal range of motion.     Right elbow: Normal range of motion. Tenderness present in medial epicondyle.     Left elbow: Normal range of motion. Tenderness present in medial epicondyle.     Cervical back:  Normal range of motion and neck supple.     Right knee: No effusion. Normal range of motion. Tenderness present over the medial joint line.     Left knee: No effusion. Normal range of motion. Tenderness present over the medial joint line.  Lymphadenopathy:     Cervical: No cervical adenopathy.  Skin:    General: Skin is warm.     Coloration: Skin is not pale.     Findings: No erythema or rash.  Neurological:     Mental Status: She is alert and oriented to person, place, and time.     Cranial Nerves: No cranial nerve deficit.     Motor: No abnormal muscle tone.     Coordination: Coordination normal.     Deep Tendon Reflexes: Reflexes normal.  Psychiatric:        Behavior: Behavior normal.        Thought Content: Thought content normal.        Judgment: Judgment normal.           Assessment & Plan:  Polyarthralgia - Plan: Sedimentation rate, CBC with Differential/Platelet, COMPLETE METABOLIC PANEL WITH GFR, CK Check a sedimentation rate.  If elevated consider an autoimmune disease.  Check a CK level.  If elevated consider holding her statin.  However I believe this is most likely fibromyalgia.  Try tramadol 50 mg every 8 hours as needed for severe pain and use Mobic 15 mg p.o. daily for arthritis.  Reassess in 1 week.  If persistent consider Lyrica versus Cymbalta

## 2021-11-07 LAB — COMPLETE METABOLIC PANEL WITH GFR
AG Ratio: 2.1 (calc) (ref 1.0–2.5)
ALT: 16 U/L (ref 6–29)
AST: 23 U/L (ref 10–35)
Albumin: 4.4 g/dL (ref 3.6–5.1)
Alkaline phosphatase (APISO): 40 U/L (ref 37–153)
BUN: 12 mg/dL (ref 7–25)
CO2: 30 mmol/L (ref 20–32)
Calcium: 8.9 mg/dL (ref 8.6–10.4)
Chloride: 104 mmol/L (ref 98–110)
Creat: 0.91 mg/dL (ref 0.60–1.00)
Globulin: 2.1 g/dL (calc) (ref 1.9–3.7)
Glucose, Bld: 108 mg/dL — ABNORMAL HIGH (ref 65–99)
Potassium: 4.1 mmol/L (ref 3.5–5.3)
Sodium: 141 mmol/L (ref 135–146)
Total Bilirubin: 0.6 mg/dL (ref 0.2–1.2)
Total Protein: 6.5 g/dL (ref 6.1–8.1)
eGFR: 67 mL/min/{1.73_m2} (ref >=60)

## 2021-11-07 LAB — CBC WITH DIFFERENTIAL/PLATELET
Absolute Monocytes: 631 cells/uL (ref 200–950)
Basophils Absolute: 30 cells/uL (ref 0–200)
Basophils Relative: 0.5 %
Eosinophils Absolute: 59 cells/uL (ref 15–500)
Eosinophils Relative: 1 %
HCT: 41 % (ref 35.0–45.0)
Hemoglobin: 14 g/dL (ref 11.7–15.5)
Lymphs Abs: 1758 cells/uL (ref 850–3900)
MCH: 32 pg (ref 27.0–33.0)
MCHC: 34.1 g/dL (ref 32.0–36.0)
MCV: 93.8 fL (ref 80.0–100.0)
MPV: 11 fL (ref 7.5–12.5)
Monocytes Relative: 10.7 %
Neutro Abs: 3422 cells/uL (ref 1500–7800)
Neutrophils Relative %: 58 %
Platelets: 215 10*3/uL (ref 140–400)
RBC: 4.37 10*6/uL (ref 3.80–5.10)
RDW: 12 % (ref 11.0–15.0)
Total Lymphocyte: 29.8 %
WBC: 5.9 10*3/uL (ref 3.8–10.8)

## 2021-11-07 LAB — SEDIMENTATION RATE: Sed Rate: 9 mm/h (ref 0–30)

## 2021-11-07 LAB — CK: Total CK: 96 U/L (ref 29–143)

## 2021-11-26 ENCOUNTER — Ambulatory Visit
Admission: EM | Admit: 2021-11-26 | Discharge: 2021-11-26 | Disposition: A | Discharge status: Home / Self Care | Payer: Medicare Other | Attending: Nurse Practitioner | Admitting: Nurse Practitioner

## 2021-11-26 DIAGNOSIS — N3000 Acute cystitis without hematuria: Secondary | ICD-10-CM | POA: Insufficient documentation

## 2021-11-26 LAB — POCT URINALYSIS DIP (MANUAL ENTRY)
Glucose, UA: 250 mg/dL — AB
Nitrite, UA: POSITIVE — AB
Protein Ur, POC: 100 mg/dL — AB
Spec Grav, UA: 1.01
Urobilinogen, UA: 4 U/dL — AB
pH, UA: 5

## 2021-11-26 MED ORDER — SULFAMETHOXAZOLE-TRIMETHOPRIM 800-160 MG PO TABS: 1.0000 | ORAL_TABLET | Freq: Two times a day (BID) | ORAL | 0 refills | Status: AC

## 2021-11-26 NOTE — ED Triage Notes (Signed)
Pt reports dysuria that started this morning. She has taken AZO for her symptoms.

## 2021-11-26 NOTE — Discharge Instructions (Addendum)
-  Take medications as prescribed. -Increase fluids.  Try to drink at least 10-12 8 ounce glasses of water daily while symptoms persist. -Ibuprofen or Tylenol for pain, fever, or general discomfort. -Develop a toileting schedule that will allow you to toilet at least every 2 hours. -Avoid caffeine to include tea, soda, and coffee. -Follow-up in the emergency department if you develop fever, chills, worsening abdominal pain, or other concerns.

## 2021-11-26 NOTE — ED Provider Notes (Signed)
[facility]    CSN: [id number] Arrival date & time: 11/26/21  1344      History   Chief Complaint Chief Complaint  Patient presents with   Urinary Tract Infection    HPI Caroline Valdez is a 71 y.o. female.    Urinary Tract Infection   Patient presents for urinary symptoms that started this morning around 2 AM.  She complains of urinary frequency, urgency, suprapubic pressure, and dysuria.  She also complains of right-sided flank pain.  She denies fever, chills, abdominal pain, nausea, vomiting, or diarrhea.  She informs her last urinary tract infection was approximately 8 or 9 years ago.  She reports that 1 point she did have recurrent UTIs but that has since improved.  She started taking Azo for her symptoms with some relief.  Past Medical History:  Diagnosis Date   Blood dyscrasia    Breast cancer ([place])    left   Complication of anesthesia    1980S  TUBAL LIG, + HEMM SURGERY PT PASSED OUT AND WENT INTO SHOCK.  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    Endometriosis    Fibromyalgia    GERD (gastroesophageal reflux disease)    Hypercholesteremia    Mini stroke 09/2012   Radiculopathy    Stroke [facility])    MINI STROKE   Von Willebrand disease ([place]) 09/11/2013   Von Willebrand's disease [facility])    history of    Patient Active Problem List   Diagnosis Date Noted   GERD (gastroesophageal reflux disease) 10/15/2018   Encounter for screening colonoscopy 10/15/2018   Personal history of breast cancer 12/05/2017   Osteoporosis 07/14/2014   Von Willebrand disease ([place]) 09/11/2013   Hypercholesteremia    Ductal carcinoma in situ (DCIS) of left breast    Radiculopathy 11/08/2010   Fibromyalgia 11/08/2010    Past Surgical History:  Procedure Laterality Date   ABDOMINAL HYSTERECTOMY     AUGMENTATION MAMMAPLASTY Right    BACK SURGERY     BREAST IMPLANT REMOVAL Right 12/05/2017   BREAST IMPLANT REMOVAL Right 12/05/2017   Procedure:  REMOVAL RIGHT  BREAST IMPLANT MATERIAL;  Surgeon: Crissie Reese, MD;  Location: [place];  Service: Plastics;  Laterality: Right;   BREAST RECONSTRUCTION     BREAST RECONSTRUCTION Right 12/05/2017   Procedure: DELAYED BREAST RECONSTRUCTION WITH SILICONE IMPLANT;  Surgeon: Crissie Reese, MD;  Location: [place];  Service: Plastics;  Laterality: Right;   CARPAL TUNNEL RELEASE     right   COLONOSCOPY  01/2010   Dr. Aviva Signs: normal   COLONOSCOPY WITH PROPOFOL N/A 01/22/2019   Procedure: COLONOSCOPY WITH PROPOFOL;  Surgeon: Daneil Dolin, MD;  [facility];  Service: Endoscopy;  Laterality: N/A;  2:30pm - can not come earlier due to transportation   [place]     left   SHOULDER SURGERY     SKIN CANCER EXCISION     TONSILLECTOMY     TUBAL LIGATION      OB History   No obstetric history on file.      Home Medications    Prior to Admission medications   Medication Sig Start Date End Date Taking? Authorizing Provider  sulfamethoxazole-trimethoprim (BACTRIM DS) 800-160 MG tablet Take 1 tablet by mouth 2 (two) times daily for 7 days. 11/26/21 12/03/21 Yes  Sangeeta Youse-Warren, Alda Lea, NP  acetaminophen (TYLENOL) 500 MG tablet Take 1,000 mg by mouth daily as needed for headache.    [provider]  acyclovir (ZOVIRAX) 400 MG tablet TAKE 1 TABLET(400 MG) BY MOUTH THREE TIMES DAILY AS NEEDED FOR COLD SORES 06/06/21   Susy Frizzle, MD  ALPRAZolam Duanne Moron) 0.5 MG tablet TAKE 1 TABLET(0.5 MG) BY MOUTH AT BEDTIME AS NEEDED 10/29/21   Susy Frizzle, MD  cholecalciferol (VITAMIN D) 1000 UNITS tablet Take 1,000 Units by mouth daily.    [provider]  cimetidine (TAGAMET) 400 MG tablet TAKE 1 TABLET TWICE A DAY AS NEEDED 05/10/21   Susy Frizzle, MD  loratadine (CLARITIN) 10 MG tablet Take 10 mg by mouth daily.    [provider]  meloxicam (MOBIC) 15 MG tablet Take 1 tablet (15 mg  total) by mouth daily. 11/06/21   Susy Frizzle, MD  montelukast (SINGULAIR) 10 MG tablet TAKE 1 TABLET(10 MG) BY MOUTH AT BEDTIME 09/14/21   Susy Frizzle, MD  Multiple Vitamin (MULTIVITAMIN WITH MINERALS) TABS tablet Take 1 tablet by mouth daily.    [provider]  pantoprazole (PROTONIX) 40 MG tablet TAKE 1 TABLET TWICE A DAY 11/14/20   Susy Frizzle, MD  rosuvastatin (CRESTOR) 20 MG tablet TAKE 1 TABLET DAILY 08/28/21   Susy Frizzle, MD  traMADol (ULTRAM) 50 MG tablet Take 1 tablet (50 mg total) by mouth every 8 (eight) hours as needed. 11/06/21 12/06/21  Susy Frizzle, MD  zinc sulfate 220 (50 Zn) MG capsule Take 220 mg by mouth daily.     [provider]    Family History Family History  Problem Relation Age of Onset   Breast cancer Mother 58       now 59yo   Stroke Father        Father had no full sisters   Breast cancer Sister 12       now 46yo; had TAH/BSO   Lung cancer Brother        died 48yo; smoker    Social History Social History   Tobacco Use   Smoking status: Never   Smokeless tobacco: Never  Vaping Use   Vaping Use: Never used  Substance Use Topics   Alcohol use: No   Drug use: No     Allergies   Aspirin, Hydrocodone, Oxycodone, and Tape   Review of Systems Review of Systems Per HPI  Physical Exam Triage Vital Signs ED Triage Vitals [11/26/21 1438]  Enc Vitals Group     BP 112/74     Pulse Rate 93     Resp 18     Temp 98.4 F (36.9 C)     Temp Source Oral     SpO2 98 %     Weight      Height      Head Circumference      Peak Flow      Pain Score      Pain Loc      Pain Edu?      Excl. in [place]?    No data found.  Updated Vital Signs BP 112/74 (BP Location: Left Arm)   Pulse 93   Temp 98.4 F (36.9 C) (Oral)   Resp 18   SpO2 98%   Visual Acuity Right Eye Distance:   Left Eye Distance:   Bilateral Distance:    Right Eye Near:   Left Eye Near:    Bilateral  Near:     Physical Exam Vitals  and nursing note reviewed.  Constitutional:      General: She is not in acute distress.    Appearance: Normal appearance. She is well-developed.  HENT:     Nose: Nose normal.     Mouth/Throat:     Mouth: Mucous membranes are moist.  Eyes:     Extraocular Movements: Extraocular movements intact.     Conjunctiva/sclera: Conjunctivae normal.     Pupils: Pupils are equal, round, and reactive to light.  Cardiovascular:     Rate and Rhythm: Normal rate and regular rhythm.     Heart sounds: Normal heart sounds.  Pulmonary:     Effort: Pulmonary effort is normal.     Breath sounds: Normal breath sounds.  Abdominal:     General: Bowel sounds are normal. There is no distension.     Palpations: Abdomen is soft.     Tenderness: There is abdominal tenderness in the suprapubic area. There is right CVA tenderness. There is no left CVA tenderness, guarding or rebound.  Genitourinary:    Vagina: Normal. No vaginal discharge.  Musculoskeletal:     Cervical back: Normal range of motion.  Skin:    General: Skin is warm and dry.     Findings: No erythema or rash.  Neurological:     General: No focal deficit present.     Mental Status: She is alert and oriented to person, place, and time.     Cranial Nerves: No cranial nerve deficit.  Psychiatric:        Mood and Affect: Mood normal.        Behavior: Behavior normal.      UC Treatments / Results  Labs (all labs ordered are listed, but only abnormal results are displayed) Labs Reviewed  POCT URINALYSIS DIP (MANUAL ENTRY) - Abnormal; Notable for the following components:      Result Value   Color, UA orange (*)    Glucose, UA =250 (*)    Bilirubin, UA small (*)    Ketones, POC UA trace (5) (*)    Blood, UA moderate (*)    Protein Ur, POC =100 (*)    Urobilinogen, UA 4.0 (*)    Nitrite, UA Positive (*)    Leukocytes, UA Large (3+) (*)    All other components within normal limits  URINE CULTURE    EKG   Radiology No results  found.  Procedures Procedures (including critical care time)  Medications Ordered in UC Medications - No data to display  Initial Impression / Assessment and Plan / UC Course  I have reviewed the triage vital signs and the nursing notes.  Pertinent labs & imaging results that were available during my care of the patient were reviewed by me and considered in my medical decision making (see chart for details).  Patient presents for urinary symptoms that started earlier this morning.  On exam, patient has right-sided flank pain and suprapubic pressure.  Her urinalysis is positive for nitrates and leukocytes.  We will treat patient for pyelonephritis in the presence of CVA tenderness.  Supportive care recommendations were provided to the patient along with strict indications of when to follow-up in this clinic versus when to go to the emergency department.  Urine culture is pending to ensure patient is being treated with the appropriate antibiotic.  Patient advised to follow-up as needed. Final Clinical Impressions(s) / UC Diagnoses   Final diagnoses:  Acute cystitis without hematuria  Discharge Instructions      -Take medications as prescribed. -Increase fluids.  Try to drink at least 10-12 8 ounce glasses of water daily while symptoms persist. -Ibuprofen or Tylenol for pain, fever, or general discomfort. -Develop a toileting schedule that will allow you to toilet at least every 2 hours. -Avoid caffeine to include tea, soda, and coffee. -Follow-up in the emergency department if you develop fever, chills, worsening abdominal pain, or other concerns.     ED Prescriptions     Medication Sig Dispense Auth. Provider   sulfamethoxazole-trimethoprim (BACTRIM DS) 800-160 MG tablet Take 1 tablet by mouth 2 (two) times daily for 7 days. 14 tablet Hong Timm-Warren, Alda Lea, NP      PDMP not reviewed this encounter.   Tish Men, NP 11/26/21 1510

## 2021-11-28 LAB — URINE CULTURE: Culture: 20000 — AB

## 2021-12-11 ENCOUNTER — Other Ambulatory Visit: Payer: Self-pay | Admitting: Family Medicine

## 2021-12-12 ENCOUNTER — Other Ambulatory Visit: Payer: Self-pay

## 2021-12-12 MED ORDER — MONTELUKAST SODIUM 10 MG PO TABS: ORAL_TABLET | ORAL | 3 refills | Status: DC

## 2021-12-14 ENCOUNTER — Encounter: Payer: Self-pay | Admitting: Family Medicine

## 2021-12-15 ENCOUNTER — Other Ambulatory Visit: Payer: Self-pay | Admitting: Family Medicine

## 2021-12-15 MED ORDER — DULOXETINE HCL 60 MG PO CPEP: 60.0000 mg | ORAL_CAPSULE | Freq: Every day | ORAL | 3 refills | Status: DC

## 2021-12-20 ENCOUNTER — Ambulatory Visit: Payer: Self-pay

## 2021-12-20 NOTE — Telephone Encounter (Addendum)
  Chief Complaint: Dry heaves, nausea Symptoms: ibid Frequency: Sunday Pertinent Negatives: Patient denies fever, neurological deficit Disposition: '[]'$ ED /'[]'$ Urgent Care (no appt availability in office) / '[x]'$ Appointment(In office/virtual)/ '[]'$  [place] [facility]/ '[]'$ Home Care/ '[]'$ Refused Recommended Disposition /'[]'$ [place] [facility]/ '[]'$  Follow-up with PCP Additional Notes: Pt took Cymbalta for the first time Saturday night. On Sunday pt spent the day in bed with nausea and dry heaves. Pt was fatigued. Pt no longer ha the dry heaves and is able to keep a small amount down. Pt still has nausea, however dry heaves have abated. Pt has not taken another Cymbalta.   Summary: feeling nauseous   Pt called in stating that she has been nauseous since Sunday. Pt states that she has been dry heaving but nothing comes up. Pt has scheduled an appt, but wanted to speak with a Triage nurse to see if there is something she could do.Please advise.   Cb#: [phone]      Reason for Disposition  Nausea lasts > 1 week  Answer Assessment - Initial Assessment Questions 1. NAUSEA SEVERITY: "How bad is the nausea?" (e.g., mild, moderate, severe; dehydration, weight loss)   - MILD: loss of appetite without change in eating habits   - MODERATE: decreased oral intake without significant weight loss, dehydration, or malnutrition   - SEVERE: inadequate caloric or fluid intake, significant weight loss, symptoms of dehydration     *No Answer* 2. ONSET: "When did the nausea begin?"     Sunday 3. VOMITING: "Any vomiting?" If Yes, ask: "How many times today?"     *No Answer* 4. RECURRENT SYMPTOM: "Have you had nausea before?" If Yes, ask: "When was the last time?" "What happened that time?"     *No Answer* 5. CAUSE: "What do you think is causing the nausea?"     Unsure - cymbalta 6. PREGNANCY: "Is there any chance you are pregnant?" (e.g., unprotected intercourse, missed birth control pill, broken condom)      na  Protocols used: Nausea-A-AH 1. Mild to moderate 3. None today. Dry heaves on Sunday 4. Not asked

## 2021-12-20 NOTE — Telephone Encounter (Signed)
Spoke w/pt today, pt aware Dr. In not in today. If symptoms worsen, encourage pt to go to UC. Otherwise, we sill see tom. At her appt.  Pt voiced understanding.

## 2021-12-21 ENCOUNTER — Other Ambulatory Visit: Payer: Self-pay | Admitting: Family Medicine

## 2021-12-21 ENCOUNTER — Ambulatory Visit (INDEPENDENT_AMBULATORY_CARE_PROVIDER_SITE_OTHER): Payer: Medicare Other | Admitting: Family Medicine

## 2021-12-21 VITALS — BP 132/78 | HR 79 | Temp 97.0°F | Ht 65.0 in | Wt 138.0 lb

## 2021-12-21 DIAGNOSIS — R110 Nausea: Secondary | ICD-10-CM

## 2021-12-21 MED ORDER — ONDANSETRON HCL 4 MG PO TABS: 4.0000 mg | ORAL_TABLET | Freq: Three times a day (TID) | ORAL | 0 refills | Status: DC | PRN

## 2021-12-21 NOTE — Progress Notes (Signed)
Subjective:    Patient ID: Caroline Valdez, female    DOB: 10-21-1950, 71 y.o.   MRN: [id number]  HPI I recently started the patient on Cymbalta for fibromyalgia pain.  She took the medication over the weekend.  She developed nausea at the first part of the week.  She stopped the medication and has not taken it since Sunday however she continues to have nausea.  She is not eating or drinking much.  She denies any hematemesis.  She denies any coffee-ground emesis.  She denies any fevers or chills or abdominal pain.  She does report weakness and fatigue.  She denies any chest pain.  She denies any pleurisy.  She denies any cough.  She denies any melena or hematochezia.  She denies any constipation.  There is no evidence of an acute abdomen today on exam Past Medical History:  Diagnosis Date   Blood dyscrasia    Breast cancer (HCC)    left   Complication of anesthesia    1980S  TUBAL LIG, + HEMM SURGERY PT PASSED OUT AND WENT INTO SHOCK.  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    Endometriosis    Fibromyalgia    GERD (gastroesophageal reflux disease)    Hypercholesteremia    Mini stroke 09/2012   Radiculopathy    Stroke [facility])    MINI STROKE   Von Willebrand disease ([place]) 09/11/2013   Von Willebrand's disease [facility])    history of   Past Surgical History:  Procedure Laterality Date   ABDOMINAL HYSTERECTOMY     AUGMENTATION MAMMAPLASTY Right    BACK SURGERY     BREAST IMPLANT REMOVAL Right 12/05/2017   BREAST IMPLANT REMOVAL Right 12/05/2017   Procedure: REMOVAL RIGHT  BREAST IMPLANT MATERIAL;  Surgeon: Crissie Reese, MD;  Location: [place];  Service: Plastics;  Laterality: Right;   BREAST RECONSTRUCTION     BREAST RECONSTRUCTION Right 12/05/2017   Procedure: DELAYED BREAST RECONSTRUCTION WITH SILICONE IMPLANT;  Surgeon: Crissie Reese, MD;  Location: [place];  Service: Plastics;  Laterality: Right;   CARPAL TUNNEL RELEASE     right   COLONOSCOPY  01/2010   Dr.  Aviva Signs: normal   COLONOSCOPY WITH PROPOFOL N/A 01/22/2019   Procedure: COLONOSCOPY WITH PROPOFOL;  Surgeon: Daneil Dolin, MD;  [facility];  Service: Endoscopy;  Laterality: N/A;  2:30pm - can not come earlier due to transportation   [place]     left   SHOULDER SURGERY     SKIN CANCER EXCISION     TONSILLECTOMY     TUBAL LIGATION     Current Outpatient Medications on File Prior to Visit  Medication Sig Dispense Refill   acetaminophen (TYLENOL) 500 MG tablet Take 1,000 mg by mouth daily as needed for headache.     acyclovir (ZOVIRAX) 400 MG tablet TAKE 1 TABLET(400 MG) BY MOUTH THREE TIMES DAILY AS NEEDED FOR COLD SORES 30 tablet 4   ALPRAZolam (XANAX) 0.5 MG tablet TAKE 1 TABLET(0.5 MG) BY MOUTH AT BEDTIME AS NEEDED 60 tablet 2   cholecalciferol (VITAMIN D) 1000 UNITS tablet Take 1,000 Units by mouth daily.     cimetidine (TAGAMET) 400 MG tablet TAKE 1 TABLET TWICE A DAY AS NEEDED 180 tablet 3   DULoxetine (CYMBALTA) 60 MG capsule Take 1 capsule (  60 mg total) by mouth daily. 30 capsule 3   loratadine (CLARITIN) 10 MG tablet Take 10 mg by mouth daily.     meloxicam (MOBIC) 15 MG tablet Take 1 tablet (15 mg total) by mouth daily. 30 tablet 0   montelukast (SINGULAIR) 10 MG tablet TAKE 1 TABLET(10 MG) BY MOUTH AT BEDTIME 90 tablet 3   Multiple Vitamin (MULTIVITAMIN WITH MINERALS) TABS tablet Take 1 tablet by mouth daily.     pantoprazole (PROTONIX) 40 MG tablet TAKE 1 TABLET TWICE A DAY 180 tablet 3   rosuvastatin (CRESTOR) 20 MG tablet TAKE 1 TABLET DAILY 90 tablet 3   zinc sulfate 220 (50 Zn) MG capsule Take 220 mg by mouth daily.      No current facility-administered medications on file prior to visit.   Allergies  Allergen Reactions   Aspirin Other (See Comments)    Pt has Von Willebrands Disease   Hydrocodone Itching   Oxycodone Itching   Tape Dermatitis    Pulls off skin    Social History   Socioeconomic History   Marital status: Widowed    Spouse name: Not on file   Number of children: 1   Years of education: Not on file   Highest education level: Not on file  Occupational History   Not on file  Tobacco Use   Smoking status: Never   Smokeless tobacco: Never  Vaping Use   Vaping Use: Never used  Substance and Sexual Activity   Alcohol use: No   Drug use: No   Sexual activity: Not on file    Comment: husband recently died from myelodysplastic syndrome in January of 2014  Other Topics Concern   Not on file  Social History Narrative   1 Daughter, April   1 grandson-20, 1 granddaughter-4.   Keeps granddaughter during the week.   Social Determinants of Health   Financial Resource Strain: Low Risk  (09/08/2021)   Overall Financial Resource Strain (CARDIA)    Difficulty of Paying Living Expenses: Not hard at all  Food Insecurity: No Food Insecurity (09/08/2021)   Hunger Vital Sign    Worried About Running Out of Food in the Last Year: Never true    Ran Out of Food in the Last Year: Never true  Transportation Needs: No Transportation Needs (09/08/2021)   PRAPARE - Hydrologist (Medical): No    Lack of Transportation (Non-Medical): No  Physical Activity: Sufficiently Active (09/08/2021)   Exercise Vital Sign    Days of Exercise per Week: 5 days    Minutes of Exercise per Session: 40 min  Stress: No Stress Concern Present (09/08/2021)   [place]    Feeling of Stress : Not at all  Social Connections: [place] (09/08/2021)   Social Connection and Isolation Panel [NHANES]    Frequency of Communication with Friends and Family: More than three times a week    Frequency of Social Gatherings with Friends and Family: More than three times a week    Attends Religious Services: More than 4 times per year    Active Member of [employer] or Organizations: Yes     Attends Archivist Meetings: More than 4 times per year    Marital Status: Married  Human resources officer Violence: Not At Risk (09/08/2021)   Humiliation, Afraid, Rape, and Kick questionnaire    Fear of Current or Ex-Partner: No    Emotionally Abused: No  Physically Abused: No    Sexually Abused: No   Family History  Problem Relation Age of Onset   Breast cancer Mother 17       now 63yo   Stroke Father        Father had no full sisters   Breast cancer Sister 41       now 53yo; had TAH/BSO   Lung cancer Brother        died 68yo; smoker   Immunization History  Administered Date(s) Administered   Fluad Quad(high Dose 65+) 02/13/2019, 03/02/2021   Influenza Split 01/19/2014   Influenza Whole 03/07/2009, 03/06/2010   Influenza,inj,Quad PF,6+ Mos 01/26/2013, 02/17/2015, 01/28/2017, 02/06/2018   Influenza-Unspecified 03/09/2016, 04/04/2020   PFIZER(Purple Top)SARS-COV-2 Vaccination 06/25/2019, 07/16/2019   Pneumococcal Conjugate-13 10/22/2014   Pneumococcal Polysaccharide-23 09/07/2010, 03/23/2016   Td 12/25/2007   Tdap 12/25/2007, 04/17/2010, 12/11/2019   Zoster Recombinat (Shingrix) 07/23/2017, 10/22/2017   Zoster, Live 03/30/2010   Mammogram is up-to-date. Colonoscopy is up-to-date.  Does not require a Pap smear given the fact she's had a hysterectomy.    Review of Systems  All other systems reviewed and are negative.      Objective:   Physical Exam Vitals reviewed.  Constitutional:      General: She is not in acute distress.    Appearance: She is well-developed. She is not diaphoretic.  HENT:     Head: Normocephalic and atraumatic.  Neck:     Thyroid: No thyromegaly.     Vascular: No JVD.     Trachea: No tracheal deviation.  Cardiovascular:     Rate and Rhythm: Normal rate and regular rhythm.     Heart sounds: Normal heart sounds. No murmur heard.    No friction rub. No gallop.  Pulmonary:     Effort: Pulmonary effort is normal. No respiratory  distress.     Breath sounds: Normal breath sounds. No stridor. No wheezing or rales.  Chest:     Chest wall: No tenderness.  Abdominal:     General: Abdomen is flat. Bowel sounds are normal. There is no distension.     Palpations: Abdomen is soft.     Tenderness: There is no abdominal tenderness. There is no guarding or rebound.  Musculoskeletal:        General: Normal range of motion.     Right elbow: Normal range of motion. Tenderness present in medial epicondyle.     Left elbow: Normal range of motion. Tenderness present in medial epicondyle.     Cervical back: Normal range of motion and neck supple.     Right knee: No effusion. Normal range of motion. Tenderness present over the medial joint line.     Left knee: No effusion. Normal range of motion. Tenderness present over the medial joint line.  Lymphadenopathy:     Cervical: No cervical adenopathy.  Skin:    General: Skin is warm.     Coloration: Skin is not pale.     Findings: No erythema or rash.  Neurological:     Mental Status: She is alert and oriented to person, place, and time.     Cranial Nerves: No cranial nerve deficit.     Motor: No abnormal muscle tone.     Coordination: Coordination normal.     Deep Tendon Reflexes: Reflexes normal.  Psychiatric:        Behavior: Behavior normal.        Thought Content: Thought content normal.  Judgment: Judgment normal.           Assessment & Plan:  Nausea - Plan: CBC with Differential/Platelet, COMPLETE METABOLIC PANEL WITH GFR, Lipase I suspect that this is related to the Cymbalta.  The other possibility would be gastroenteritis.  I recommended Zofran 4 mg every 8 hours as needed and hold Cymbalta.  Push fluids such as Gatorade and eat a bland diet.  Obtain a CBC CMP and a lipase.  Recheck immediately if worsening.

## 2021-12-22 LAB — CBC WITH DIFFERENTIAL/PLATELET
Absolute Monocytes: 621 cells/uL (ref 200–950)
Basophils Absolute: 22 cells/uL (ref 0–200)
Basophils Relative: 0.4 %
Eosinophils Absolute: 49 cells/uL (ref 15–500)
Eosinophils Relative: 0.9 %
HCT: 41.7 % (ref 35.0–45.0)
Hemoglobin: 14.4 g/dL (ref 11.7–15.5)
Lymphs Abs: 1544 cells/uL (ref 850–3900)
MCH: 32.1 pg (ref 27.0–33.0)
MCHC: 34.5 g/dL (ref 32.0–36.0)
MCV: 92.9 fL (ref 80.0–100.0)
MPV: 10.8 fL (ref 7.5–12.5)
Monocytes Relative: 11.5 %
Neutro Abs: 3164 cells/uL (ref 1500–7800)
Neutrophils Relative %: 58.6 %
Platelets: 208 10*3/uL (ref 140–400)
RBC: 4.49 10*6/uL (ref 3.80–5.10)
RDW: 12.5 % (ref 11.0–15.0)
Total Lymphocyte: 28.6 %
WBC: 5.4 10*3/uL (ref 3.8–10.8)

## 2021-12-22 LAB — COMPLETE METABOLIC PANEL WITH GFR
AG Ratio: 2 (calc) (ref 1.0–2.5)
ALT: 18 U/L (ref 6–29)
AST: 24 U/L (ref 10–35)
Albumin: 4.6 g/dL (ref 3.6–5.1)
Alkaline phosphatase (APISO): 39 U/L (ref 37–153)
BUN: 10 mg/dL (ref 7–25)
CO2: 28 mmol/L (ref 20–32)
Calcium: 9.8 mg/dL (ref 8.6–10.4)
Chloride: 102 mmol/L (ref 98–110)
Creat: 0.93 mg/dL (ref 0.60–1.00)
Globulin: 2.3 g/dL (calc) (ref 1.9–3.7)
Glucose, Bld: 94 mg/dL (ref 65–99)
Potassium: 4.6 mmol/L (ref 3.5–5.3)
Sodium: 142 mmol/L (ref 135–146)
Total Bilirubin: 0.7 mg/dL (ref 0.2–1.2)
Total Protein: 6.9 g/dL (ref 6.1–8.1)
eGFR: 65 mL/min/{1.73_m2} (ref >=60)

## 2021-12-22 LAB — LIPASE: Lipase: 27 U/L (ref 7–60)

## 2021-12-23 ENCOUNTER — Emergency Department (HOSPITAL_COMMUNITY): Payer: Medicare Other

## 2021-12-23 ENCOUNTER — Emergency Department (HOSPITAL_COMMUNITY)
Admission: EM | Admit: 2021-12-23 | Discharge: 2021-12-23 | Disposition: A | Discharge status: Home / Self Care | Payer: Medicare Other | Attending: Emergency Medicine | Admitting: Emergency Medicine

## 2021-12-23 ENCOUNTER — Encounter (HOSPITAL_COMMUNITY): Payer: Self-pay

## 2021-12-23 DIAGNOSIS — K5903 Drug induced constipation: Secondary | ICD-10-CM | POA: Diagnosis not present

## 2021-12-23 DIAGNOSIS — K5900 Constipation, unspecified: Secondary | ICD-10-CM | POA: Diagnosis not present

## 2021-12-23 DIAGNOSIS — K649 Unspecified hemorrhoids: Secondary | ICD-10-CM | POA: Diagnosis not present

## 2021-12-23 LAB — URINALYSIS, ROUTINE W REFLEX MICROSCOPIC
Bacteria, UA: NONE SEEN
Bilirubin Urine: NEGATIVE
Glucose, UA: NEGATIVE mg/dL
Ketones, ur: NEGATIVE mg/dL
Leukocytes,Ua: NEGATIVE
Nitrite: NEGATIVE
Protein, ur: NEGATIVE mg/dL
Specific Gravity, Urine: 1.003 — ABNORMAL LOW (ref 1.005–1.030)
pH: 7 (ref 5.0–8.0)

## 2021-12-23 LAB — COMPREHENSIVE METABOLIC PANEL
ALT: 20 U/L (ref 0–44)
AST: 27 U/L (ref 15–41)
Albumin: 4.3 g/dL (ref 3.5–5.0)
Alkaline Phosphatase: 39 U/L (ref 38–126)
Anion gap: 6 (ref 5–15)
BUN: 11 mg/dL (ref 8–23)
CO2: 30 mmol/L (ref 22–32)
Calcium: 9.1 mg/dL (ref 8.9–10.3)
Chloride: 104 mmol/L (ref 98–111)
Creatinine, Ser: 0.91 mg/dL (ref 0.44–1.00)
GFR, Estimated: >60 mL/min (ref >60)
Glucose, Bld: 125 mg/dL — ABNORMAL HIGH (ref 70–99)
Potassium: 4.1 mmol/L (ref 3.5–5.1)
Sodium: 140 mmol/L (ref 135–145)
Total Bilirubin: 0.7 mg/dL (ref 0.3–1.2)
Total Protein: 7.2 g/dL (ref 6.5–8.1)

## 2021-12-23 LAB — CBC
HCT: 42.3 % (ref 36.0–46.0)
Hemoglobin: 14.3 g/dL (ref 12.0–15.0)
MCH: 31.8 pg (ref 26.0–34.0)
MCHC: 33.8 g/dL (ref 30.0–36.0)
MCV: 94 fL (ref 80.0–100.0)
Platelets: 210 10*3/uL (ref 150–400)
RBC: 4.5 MIL/uL (ref 3.87–5.11)
RDW: 12.5 % (ref 11.5–15.5)
WBC: 13.6 10*3/uL — ABNORMAL HIGH (ref 4.0–10.5)
nRBC: 0 % (ref 0.0–0.2)

## 2021-12-23 LAB — LIPASE, BLOOD: Lipase: 43 U/L (ref 11–51)

## 2021-12-23 MED ORDER — FLEET ENEMA 7-19 GM/118ML RE ENEM
1.0000 | ENEMA | Freq: Once | RECTAL | Status: AC
  Administered 2021-12-23: 1 via RECTAL

## 2021-12-23 NOTE — ED Notes (Signed)
PA-C at bedside performing disimpaction on pt

## 2021-12-23 NOTE — ED Notes (Signed)
Pt stated after the fleet enema, she had a small bowel movement and she stated "I saw a lot of blood there, I have hemorrhoids though, but theres not as much pressure in my stomach as before" PA-C made aware

## 2021-12-23 NOTE — ED Provider Notes (Signed)
[facility] EMERGENCY DEPARTMENT Provider Note   CSN: [id number] Arrival date & time: 12/23/21  1230     History  Chief Complaint  Patient presents with   Constipation    Caroline Valdez is a 71 y.o. female.  Patient with history of von willebrand disease presents today with complaints of constipation. She states that last week she started Cymbalta for fibromyalgia which made her nauseous, she was seen by her PCP at that time who gave her Zofran which improved her nausea. However, she states that since starting the Cymbalta she also had some more trouble with bowel movements. States that she was only able to pass small bits of hard stool for a few days and then since Tuesday she has not had a bowel movement at all. States that she has been taking doculax and miralax for same without any success. States that she feels like the stool is stuck in her rectum and she cant get it out. States that she is concerned because the straining is making her hemorrhoids bleed. Denies fevers, chills, nausea, vomiting, abdominal pain. She is passing flatus normally.  The history is provided by the patient. No language interpreter was used.  Constipation      Home Medications Prior to Admission medications   Medication Sig Start Date End Date Taking? Authorizing Provider  acetaminophen (TYLENOL) 500 MG tablet Take 1,000 mg by mouth daily as needed for headache.    [provider]  acyclovir (ZOVIRAX) 400 MG tablet TAKE 1 TABLET(400 MG) BY MOUTH THREE TIMES DAILY AS NEEDED FOR COLD SORES 06/06/21   Susy Frizzle, MD  ALPRAZolam Duanne Moron) 0.5 MG tablet TAKE 1 TABLET(0.5 MG) BY MOUTH AT BEDTIME AS NEEDED 10/29/21   Susy Frizzle, MD  cholecalciferol (VITAMIN D) 1000 UNITS tablet Take 1,000 Units by mouth daily.    [provider]  cimetidine (TAGAMET) 400 MG tablet TAKE 1 TABLET TWICE A DAY AS NEEDED 05/10/21   Susy Frizzle, MD  DULoxetine (CYMBALTA) 60 MG capsule Take 1  capsule (60 mg total) by mouth daily. 12/15/21   Susy Frizzle, MD  loratadine (CLARITIN) 10 MG tablet Take 10 mg by mouth daily.    [provider]  meloxicam (MOBIC) 15 MG tablet Take 1 tablet (15 mg total) by mouth daily. 11/06/21   Susy Frizzle, MD  montelukast (SINGULAIR) 10 MG tablet TAKE 1 TABLET(10 MG) BY MOUTH AT BEDTIME 12/12/21   Susy Frizzle, MD  Multiple Vitamin (MULTIVITAMIN WITH MINERALS) TABS tablet Take 1 tablet by mouth daily.    [provider]  ondansetron (ZOFRAN) 4 MG tablet Take 1 tablet (4 mg total) by mouth every 8 (eight) hours as needed for nausea or vomiting. 12/21/21   Susy Frizzle, MD  pantoprazole (PROTONIX) 40 MG tablet TAKE 1 TABLET TWICE A DAY 12/11/21   Susy Frizzle, MD  rosuvastatin (CRESTOR) 20 MG tablet TAKE 1 TABLET DAILY 08/28/21   Susy Frizzle, MD  zinc sulfate 220 (50 Zn) MG capsule Take 220 mg by mouth daily.     [provider]      Allergies    Aspirin, Hydrocodone, Oxycodone, and Tape    Review of Systems   Review of Systems  Gastrointestinal:  Positive for constipation.  All other systems reviewed and are negative.   Physical Exam Updated Vital Signs BP (!) 127/90 (BP Location: Right Arm)   Pulse (!) 104   Temp 99.2 F (37.3 C) (Oral)  Resp 16   Wt 61.4 kg   SpO2 98%   BMI 22.52 kg/m  Physical Exam Vitals and nursing note reviewed. Exam conducted with a chaperone present.  Constitutional:      General: She is not in acute distress.    Appearance: Normal appearance. She is normal weight. She is not ill-appearing, toxic-appearing or diaphoretic.  HENT:     Head: Normocephalic and atraumatic.  Cardiovascular:     Rate and Rhythm: Normal rate.  Pulmonary:     Effort: Pulmonary effort is normal. No respiratory distress.  Abdominal:     General: Abdomen is flat.     Palpations: Abdomen is soft.     Tenderness: There is no abdominal tenderness.  Genitourinary:    Comments: Several  external hemorrhoids present with some active bleeding visualized, no thrombosed hemorrhoids present.   DRE performed with some light brown stool present in the rectal vault. Attempt disimpaction, however stool was not low enough in the rectum to palpate or disimpact.  Musculoskeletal:        General: Normal range of motion.     Cervical back: Normal range of motion.  Skin:    General: Skin is warm and dry.  Neurological:     General: No focal deficit present.     Mental Status: She is alert.  Psychiatric:        Mood and Affect: Mood normal.        Behavior: Behavior normal.     ED Results / Procedures / Treatments   Labs (all labs ordered are listed, but only abnormal results are displayed) Labs Reviewed  COMPREHENSIVE METABOLIC PANEL - Abnormal; Notable for the following components:      Result Value   Glucose, Bld 125 (*)    All other components within normal limits  CBC - Abnormal; Notable for the following components:   WBC 13.6 (*)    All other components within normal limits  URINALYSIS, ROUTINE W REFLEX MICROSCOPIC - Abnormal; Notable for the following components:   Color, Urine STRAW (*)    Specific Gravity, Urine 1.003 (*)    Hgb urine dipstick MODERATE (*)    All other components within normal limits  LIPASE, BLOOD    EKG None  Radiology DG Abdomen 1 View  Result Date: 12/23/2021 CLINICAL DATA:  Constipation. Some rectal bleeding from straining, presumably from hemorrhoids. EXAM: ABDOMEN - 1 VIEW COMPARISON:  None Available. FINDINGS: There is no bowel dilation to suggest obstruction or significant adynamic ileus. Mild to moderate increased stool noted in the rectum. Mild colonic stool burden. No evidence of renal or ureteral stones. Soft tissues are unremarkable. No acute or significant skeletal abnormality. IMPRESSION: 1. No acute findings. 2. Mild to moderate increased stool in the rectum. Mild colonic stool burden. Electronically Signed   By: Lajean Manes  M.D.   On: 12/23/2021 13:19    Procedures Procedures    Medications Ordered in ED Medications  sodium phosphate (FLEET) 7-19 GM/118ML enema 1 enema (1 enema Rectal Given 12/23/21 1541)    ED Course/ Medical Decision Making/ A&P                           Medical Decision Making Amount and/or Complexity of Data Reviewed Labs: ordered. Radiology: ordered.  Risk OTC drugs.   This patient presents to the ED for concern of constipation   Co morbidities that complicate the patient evaluation  Hx von willebrand disease  Additional history obtained:  Additional history obtained from epic chart review   Lab Tests:  I Ordered, and personally interpreted labs.  The pertinent results include:  WBC 13.6, moderate hematuria. No other acute laboratory findings   Imaging Studies ordered:  I ordered imaging studies including DG abdomen  I independently visualized and interpreted imaging which showed  1. No acute findings. 2. Mild to moderate increased stool in the rectum. Mild colonic stool burden. I agree with the radiologist interpretation   Problem List / ED Course / Critical interventions / Medication management  I ordered medication including Fleet's enema  for constipation  Reevaluation of the patient after these medicines showed that the patient improved I have reviewed the patients home medicines and have made adjustments as needed   Test / Admission - Considered:  Patient presents today with complaints of constipation. She is afebrile, non-toxic appearing, and in no acute distress with reassuring vital signs. Physical exam reveals abdomen which is soft and non-tender. Rectal exam performed which showed bleeding non-thrombosed external hemorrhoids. No palpable hard stool present in the rectal vault for disimpaction. After fleets enema, patient states that she had a moderate sized bowel movement and states that her symptoms have improved since. She did state there was  some blood when she wiped and trace blood in the toilet, however I suspect this is related to the hemorrhoids. Given she has no abdominal pain and no significant rectal pain on exam, low suspicion for proctitis or lower GI bleed. Her hemoglobin is stable at this time. No further imaging is indicated. No concern for obstruction. Will recommend increase in her Miralax regimen from 1 capful every other day to 2 capfuls every day while she is having symptoms. Will also recommend high fiber diet. Suspect her symptoms are related to constipation from initiation of Cymbalta since this is a side effect of this medication. Will recommend that she call her doctor on Monday to further discuss management of her symptoms. Also given GI follow-up as well. Return precautions given. She is stable for discharge, patient is understanding and amenable with plan, discharged in stable condition   Findings and plan of care discussed with supervising physician Dr. Roderic Palau who is in agreement.    Final Clinical Impression(s) / ED Diagnoses Final diagnoses:  Drug-induced constipation    Rx / DC Orders ED Discharge Orders     None     An After Visit Summary was printed and given to the patient.     Nestor Lewandowsky 12/23/21 1712    Milton Ferguson, MD 12/23/21 [phone]

## 2021-12-23 NOTE — ED Triage Notes (Signed)
Pt states no BM since Tuesday.  Miralax since Tuesday, dulcolax last night. Pt states she has only had "dribbles" come out. Pt states she is now having bleeding from straining from hemorrhoids, bright red blood.   Pt states dry heaves and nausea since last weekend.

## 2021-12-23 NOTE — ED Notes (Addendum)
Pt given Fleet enema

## 2021-12-23 NOTE — Discharge Instructions (Addendum)
Your work-up in the ER today was reassuring for acute abnormalities.  I suspect that your constipation is related to the initiation of the Cymbalta that you started recently.  I recommend that you increase the amount of MiraLAX that you take from 1 capful every other day to 2 capfuls every day while you are having difficulty with bowel movements.  I also recommend that you consume a high-fiber diet with whole grains, leafy greens, and plenty of vegetables and fruits.  This will also help your hemorrhoids stop bleeding.  I also strongly recommend that you call your gastroenterologist and your PCP to schedule an appointment within the next week for further evaluation and management.   Return if you have development of worsening bleeding, bleeding that does not stop, or any new or worsening symptoms.

## 2021-12-24 ENCOUNTER — Encounter: Payer: Self-pay | Admitting: Family Medicine

## 2021-12-26 ENCOUNTER — Ambulatory Visit (INDEPENDENT_AMBULATORY_CARE_PROVIDER_SITE_OTHER): Payer: Medicare Other | Admitting: Family Medicine

## 2021-12-26 VITALS — BP 142/68 | HR 78 | Temp 97.9°F | Ht 65.0 in | Wt 126.0 lb

## 2021-12-26 DIAGNOSIS — R1030 Lower abdominal pain, unspecified: Secondary | ICD-10-CM

## 2021-12-26 DIAGNOSIS — D72829 Elevated white blood cell count, unspecified: Secondary | ICD-10-CM

## 2021-12-26 LAB — URINALYSIS, ROUTINE W REFLEX MICROSCOPIC
Bacteria, UA: NONE SEEN /HPF
Bilirubin Urine: NEGATIVE
Glucose, UA: NEGATIVE
Hyaline Cast: NONE SEEN /LPF
Ketones, ur: NEGATIVE
Leukocytes,Ua: NEGATIVE
Nitrite: NEGATIVE
Protein, ur: NEGATIVE
Specific Gravity, Urine: 1.015 (ref 1.001–1.035)
WBC, UA: NONE SEEN /HPF (ref 0–5)
pH: 7 (ref 5.0–8.0)

## 2021-12-26 LAB — MICROSCOPIC MESSAGE

## 2021-12-26 NOTE — Progress Notes (Signed)
Subjective:    Patient ID: Caroline Valdez, female    DOB: 1950/09/18, 71 y.o.   MRN: [id number]  Abdominal Pain   I saw the patient August 3 for nausea.  I assume it was due to Cymbalta so I stopped the medication.  Over the weekend she had to go the emergency room.  She was not experiencing any vomiting but she continued to have nausea and then she developed lower abdominal pain.  In the emergency room she had an elevated white blood cell count of 13.6.  She had moderate amount of blood in his urine.  An x-ray of the lower abdomen showed a moderate amount of stool in the rectum but was otherwise unremarkable.  She was given a bowel regimen for constipation.  She presents today for follow-up.  Repeat urinalysis today continues to show trace amount of blood.  She denies any fevers or chills however I do not have explanation for the leukocytosis seen in the emergency room.  She also continues to report lower abdominal pain and pressure.  She does not have an acute abdomen today on exam.  Her belly is soft with normal bowel sounds however she continues to have lower abdominal pain Past Medical History:  Diagnosis Date   Blood dyscrasia    Breast cancer (HCC)    left   Complication of anesthesia    1980S  TUBAL LIG, + HEMM SURGERY PT PASSED OUT AND WENT INTO SHOCK.  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    Endometriosis    Fibromyalgia    GERD (gastroesophageal reflux disease)    Hypercholesteremia    Mini stroke 09/2012   Radiculopathy    Stroke [facility])    MINI STROKE   Von Willebrand disease ([place]) 09/11/2013   Von Willebrand's disease [facility])    history of   Past Surgical History:  Procedure Laterality Date   ABDOMINAL HYSTERECTOMY     AUGMENTATION MAMMAPLASTY Right    BACK SURGERY     BREAST IMPLANT REMOVAL Right 12/05/2017   BREAST IMPLANT REMOVAL Right 12/05/2017   Procedure: REMOVAL RIGHT  BREAST IMPLANT MATERIAL;  Surgeon: Crissie Reese, MD;  Location:  [place];  Service: Plastics;  Laterality: Right;   BREAST RECONSTRUCTION     BREAST RECONSTRUCTION Right 12/05/2017   Procedure: DELAYED BREAST RECONSTRUCTION WITH SILICONE IMPLANT;  Surgeon: Crissie Reese, MD;  Location: [place];  Service: Plastics;  Laterality: Right;   CARPAL TUNNEL RELEASE     right   COLONOSCOPY  01/2010   Dr. Aviva Signs: normal   COLONOSCOPY WITH PROPOFOL N/A 01/22/2019   Procedure: COLONOSCOPY WITH PROPOFOL;  Surgeon: Daneil Dolin, MD;  [facility];  Service: Endoscopy;  Laterality: N/A;  2:30pm - can not come earlier due to transportation   [place]     left   SHOULDER SURGERY     SKIN CANCER EXCISION     TONSILLECTOMY     TUBAL LIGATION     Current Outpatient Medications on File Prior to Visit  Medication Sig Dispense Refill   acetaminophen (TYLENOL) 500 MG tablet Take 1,000 mg by mouth daily as needed for headache.     acyclovir (ZOVIRAX) 400 MG tablet TAKE 1 TABLET(400 MG) BY MOUTH THREE TIMES DAILY AS NEEDED FOR COLD SORES 30 tablet 4  ALPRAZolam (XANAX) 0.5 MG tablet TAKE 1 TABLET(0.5 MG) BY MOUTH AT BEDTIME AS NEEDED 60 tablet 2   cholecalciferol (VITAMIN D) 1000 UNITS tablet Take 1,000 Units by mouth daily.     cimetidine (TAGAMET) 400 MG tablet TAKE 1 TABLET TWICE A DAY AS NEEDED 180 tablet 3   loratadine (CLARITIN) 10 MG tablet Take 10 mg by mouth daily.     montelukast (SINGULAIR) 10 MG tablet TAKE 1 TABLET(10 MG) BY MOUTH AT BEDTIME 90 tablet 3   Multiple Vitamin (MULTIVITAMIN WITH MINERALS) TABS tablet Take 1 tablet by mouth daily.     ondansetron (ZOFRAN) 4 MG tablet Take 1 tablet (4 mg total) by mouth every 8 (eight) hours as needed for nausea or vomiting. 20 tablet 0   pantoprazole (PROTONIX) 40 MG tablet TAKE 1 TABLET TWICE A DAY 180 tablet 3   polyethylene glycol (MIRALAX / GLYCOLAX) 17 g packet Take 17 g by mouth daily.     rosuvastatin (CRESTOR) 20 MG  tablet TAKE 1 TABLET DAILY 90 tablet 3   zinc sulfate 220 (50 Zn) MG capsule Take 220 mg by mouth daily.      DULoxetine (CYMBALTA) 60 MG capsule Take 1 capsule (60 mg total) by mouth daily. (Patient not taking: Reported on 12/26/2021) 30 capsule 3   meloxicam (MOBIC) 15 MG tablet Take 1 tablet (15 mg total) by mouth daily. (Patient not taking: Reported on 12/26/2021) 30 tablet 0   No current facility-administered medications on file prior to visit.   Allergies  Allergen Reactions   Aspirin Other (See Comments)    Pt has Von Willebrands Disease   Hydrocodone Itching   Oxycodone Itching   Tape Dermatitis    Pulls off skin   Social History   Socioeconomic History   Marital status: Widowed    Spouse name: Not on file   Number of children: 1   Years of education: Not on file   Highest education level: Not on file  Occupational History   Not on file  Tobacco Use   Smoking status: Never   Smokeless tobacco: Never  Vaping Use   Vaping Use: Never used  Substance and Sexual Activity   Alcohol use: No   Drug use: No   Sexual activity: Not on file    Comment: husband recently died from myelodysplastic syndrome in January of 2014  Other Topics Concern   Not on file  Social History Narrative   1 Daughter, April   1 grandson-20, 1 granddaughter-4.   Keeps granddaughter during the week.   Social Determinants of Health   Financial Resource Strain: Low Risk  (09/08/2021)   Overall Financial Resource Strain (CARDIA)    Difficulty of Paying Living Expenses: Not hard at all  Food Insecurity: No Food Insecurity (09/08/2021)   Hunger Vital Sign    Worried About Running Out of Food in the Last Year: Never true    Ran Out of Food in the Last Year: Never true  Transportation Needs: No Transportation Needs (09/08/2021)   PRAPARE - Hydrologist (Medical): No    Lack of Transportation (Non-Medical): No  Physical Activity: Sufficiently Active (09/08/2021)   Exercise  Vital Sign    Days of Exercise per Week: 5 days    Minutes of Exercise per Session: 40 min  Stress: No Stress Concern Present (09/08/2021)   [place]    Feeling of Stress : Not at all  Social  Connections: Socially Integrated (09/08/2021)   Social Connection and Isolation Panel [NHANES]    Frequency of Communication with Friends and Family: More than three times a week    Frequency of Social Gatherings with Friends and Family: More than three times a week    Attends Religious Services: More than 4 times per year    Active Member of Clubs or Organizations: Yes    Attends Archivist Meetings: More than 4 times per year    Marital Status: Married  Human resources officer Violence: Not At Risk (09/08/2021)   Humiliation, Afraid, Rape, and Kick questionnaire    Fear of Current or Ex-Partner: No    Emotionally Abused: No    Physically Abused: No    Sexually Abused: No   Family History  Problem Relation Age of Onset   Breast cancer Mother 8       now 95yo   Stroke Father        Father had no full sisters   Breast cancer Sister 26       now 35yo; had TAH/BSO   Lung cancer Brother        died 98yo; smoker   Immunization History  Administered Date(s) Administered   Fluad Quad(high Dose 65+) 02/13/2019, 03/02/2021   Influenza Split 01/19/2014   Influenza Whole 03/07/2009, 03/06/2010   Influenza,inj,Quad PF,6+ Mos 01/26/2013, 02/17/2015, 01/28/2017, 02/06/2018   Influenza-Unspecified 03/09/2016, 04/04/2020   PFIZER(Purple Top)SARS-COV-2 Vaccination 06/25/2019, 07/16/2019   Pneumococcal Conjugate-13 10/22/2014   Pneumococcal Polysaccharide-23 09/07/2010, 03/23/2016   Td 12/25/2007   Tdap 12/25/2007, 04/17/2010, 12/11/2019   Zoster Recombinat (Shingrix) 07/23/2017, 10/22/2017   Zoster, Live 03/30/2010   Mammogram is up-to-date. Colonoscopy is up-to-date.  Does not require a Pap smear given the fact she's had a  hysterectomy.    Review of Systems  Gastrointestinal:  Positive for abdominal pain.  All other systems reviewed and are negative.      Objective:   Physical Exam Vitals reviewed.  Constitutional:      General: She is not in acute distress.    Appearance: She is well-developed. She is not diaphoretic.  HENT:     Head: Normocephalic and atraumatic.  Neck:     Thyroid: No thyromegaly.     Vascular: No JVD.     Trachea: No tracheal deviation.  Cardiovascular:     Rate and Rhythm: Normal rate and regular rhythm.     Heart sounds: Normal heart sounds. No murmur heard.    No friction rub. No gallop.  Pulmonary:     Effort: Pulmonary effort is normal. No respiratory distress.     Breath sounds: Normal breath sounds. No stridor. No wheezing or rales.  Chest:     Chest wall: No tenderness.  Abdominal:     General: Abdomen is flat. Bowel sounds are normal. There is no distension.     Palpations: Abdomen is soft.     Tenderness: There is no abdominal tenderness. There is no guarding or rebound.  Musculoskeletal:        General: Normal range of motion.     Cervical back: Normal range of motion and neck supple.     Right knee: No effusion. Normal range of motion. Tenderness present over the medial joint line.     Left knee: No effusion. Normal range of motion. Tenderness present over the medial joint line.  Lymphadenopathy:     Cervical: No cervical adenopathy.  Skin:    General: Skin is warm.  Coloration: Skin is not pale.     Findings: No erythema or rash.  Neurological:     Mental Status: She is alert and oriented to person, place, and time.     Cranial Nerves: No cranial nerve deficit.     Motor: No abnormal muscle tone.     Coordination: Coordination normal.     Deep Tendon Reflexes: Reflexes normal.  Psychiatric:        Behavior: Behavior normal.        Thought Content: Thought content normal.        Judgment: Judgment normal.           Assessment & Plan:   Lower abdominal pain - Plan: Urinalysis, Routine w reflex microscopic  Leukocytosis, unspecified type - Plan: CBC with Differential/Platelet, COMPLETE METABOLIC PANEL WITH GFR Moderate amount of blood is improved today that was seen in her urine however she did have leukocytosis in the emergency room without an explanation.  Therefore I will repeat a CBC today along with a CMP.  Given the persistent lower abdominal pain and leukocytosis I will schedule a CT scan as her physical exam and history does not point to a specific cause.

## 2021-12-27 LAB — CBC WITH DIFFERENTIAL/PLATELET
Absolute Monocytes: 502 cells/uL (ref 200–950)
Basophils Absolute: 40 cells/uL (ref 0–200)
Basophils Relative: 0.7 %
Eosinophils Absolute: 143 cells/uL (ref 15–500)
Eosinophils Relative: 2.5 %
HCT: 42.8 % (ref 35.0–45.0)
Hemoglobin: 14.8 g/dL (ref 11.7–15.5)
Lymphs Abs: 1607 cells/uL (ref 850–3900)
MCH: 32.2 pg (ref 27.0–33.0)
MCHC: 34.6 g/dL (ref 32.0–36.0)
MCV: 93.2 fL (ref 80.0–100.0)
MPV: 11.2 fL (ref 7.5–12.5)
Monocytes Relative: 8.8 %
Neutro Abs: 3409 cells/uL (ref 1500–7800)
Neutrophils Relative %: 59.8 %
Platelets: 203 10*3/uL (ref 140–400)
RBC: 4.59 10*6/uL (ref 3.80–5.10)
RDW: 12.3 % (ref 11.0–15.0)
Total Lymphocyte: 28.2 %
WBC: 5.7 10*3/uL (ref 3.8–10.8)

## 2021-12-27 LAB — COMPLETE METABOLIC PANEL WITH GFR
AG Ratio: 1.9 (calc) (ref 1.0–2.5)
ALT: 17 U/L (ref 6–29)
AST: 25 U/L (ref 10–35)
Albumin: 4.6 g/dL (ref 3.6–5.1)
Alkaline phosphatase (APISO): 42 U/L (ref 37–153)
BUN: 13 mg/dL (ref 7–25)
CO2: 26 mmol/L (ref 20–32)
Calcium: 9.6 mg/dL (ref 8.6–10.4)
Chloride: 104 mmol/L (ref 98–110)
Creat: 0.84 mg/dL (ref 0.60–1.00)
Globulin: 2.4 g/dL (calc) (ref 1.9–3.7)
Glucose, Bld: 94 mg/dL (ref 65–99)
Potassium: 4.7 mmol/L (ref 3.5–5.3)
Sodium: 141 mmol/L (ref 135–146)
Total Bilirubin: 0.6 mg/dL (ref 0.2–1.2)
Total Protein: 7 g/dL (ref 6.1–8.1)
eGFR: 74 mL/min/{1.73_m2} (ref >=60)

## 2021-12-29 ENCOUNTER — Ambulatory Visit: Payer: Medicare Other | Admitting: Family Medicine

## 2022-01-09 ENCOUNTER — Encounter: Payer: Self-pay | Admitting: Gastroenterology

## 2022-01-09 ENCOUNTER — Ambulatory Visit (INDEPENDENT_AMBULATORY_CARE_PROVIDER_SITE_OTHER): Payer: Medicare Other | Admitting: Gastroenterology

## 2022-01-09 VITALS — BP 147/78 | HR 86 | Temp 98.1°F | Ht 65.0 in | Wt 135.0 lb

## 2022-01-09 DIAGNOSIS — K219 Gastro-esophageal reflux disease without esophagitis: Secondary | ICD-10-CM

## 2022-01-09 DIAGNOSIS — K5904 Chronic idiopathic constipation: Secondary | ICD-10-CM

## 2022-01-09 MED ORDER — TRULANCE 3 MG PO TABS: 3.0000 mg | ORAL_TABLET | Freq: Every day | ORAL | 2 refills | Status: DC

## 2022-01-09 NOTE — Progress Notes (Signed)
GI Office Note    Referring Provider: Susy Frizzle, MD Primary Care Physician:  Susy Frizzle, MD  Primary Gastroenterologist: Dr. Gala Romney  Chief Complaint   Chief Complaint  Patient presents with   Constipation    Not emptying completely when she has a BM.      History of Present Illness   Caroline Valdez is a 71 y.o. female presenting today at the request of Susy Frizzle, MD for constipation.  Patient last seen in the clinic in May 2020 for follow-up regarding needing for screening colonoscopy.  She reported colonoscopy in 2011 by Dr. Arnoldo Morale that was normal.  She reported regular bowel movements but did note occasional hemorrhoid issues.  She denies any abdominal pain.  She reports she has been on pantoprazole in the morning time and Zantac at [place] but due to Zantac recall she changed her medication she was not able to control her symptoms with once daily pantoprazole so her PCP increased her to twice daily.  She reported a little bit of continued heartburn every day without vomiting.  Denied dysphagia.  She reportedly was diagnosed with von Willebrand's disease remotely.  See, reporting 3 episodes of hemorrhage of months occurring over a week after tonsillectomy when she was a teenager.  She had also reported hemorrhage after one of her childbirth and believes another incidence after her hysterectomy.  She reported receiving DDAVP prior to other surgeries.  She was advised to continue pantoprazole 40 mg twice daily and was also given cimetidine to take 40 mg twice daily as needed for breakthrough symptoms.  She is also scheduled for colonoscopy.  Colonoscopy September 2020 with diverticulosis in the sigmoid and descending colon, redundant colon, otherwise normal.  No repeat advised.  Recently seen in the ED 12/23/2021 for constipation.  She reported after starting Cymbalta she only had hard stools for few days and then went a few days without a bowel movement abdominal.   She reported trying Dulcolax and MiraLAX without any success was feeling like stool stuck in her rectum that she cannot get out.  She did not want to strain due to making her hemorrhoids bleed.  On rectal exam she did not have any formed hard stool in the rectal vault to be disimpacted.  She was given Fleet enema and had moderate-sized bowel movement and reported improvement in symptoms.  She was advised to increase her MiraLAX from 1 capful every other day to 2 capfuls every day, having symptoms and was advised high-fiber diet.  Patient seen PCP on 12/26/2021 reporting continued lower abdominal pain.  A UA was obtained she was ordered to have CBC and CMP completed and PCP ordered CT scan.  CBC and CMP were normal.  Today: Took Cymbalta but then had nausea dry heaves and sweating and then began having constipation. She was taking the miralax every other day and then went to the ER and she has since been taking 2 capfuls a day and seldom some dulcolax as well but causes abdominal cramping. Has hemorrhoids and uses suppositories to help with occasional bleeding. Continues to have incomplete emptying. She can feel her abdomen churning but no pain right now. Going everyday now but stools are about a Bristol 6 mostly. Having to wipe multiple times when having a BM. Has not used any enemas at home. No extra fiber supplements. Has been attempting to eat more fruits and vegetables. Spends 10 minutes at most on the commode. Has had hemorrhoid surgery in  the past and was worse pain she has ever had. Weight stable. No lack of appetite. Denies dysphagia.   GERD fairly well controlled. Takes tums occasionally but not frequent. Also takes cimetidine twice daily.    Current Outpatient Medications  Medication Sig Dispense Refill   acetaminophen (TYLENOL) 500 MG tablet Take 1,000 mg by mouth daily as needed for headache.     ALPRAZolam (XANAX) 0.5 MG tablet TAKE 1 TABLET(0.5 MG) BY MOUTH AT BEDTIME AS NEEDED 60 tablet 2    cholecalciferol (VITAMIN D) 1000 UNITS tablet Take 1,000 Units by mouth daily.     cimetidine (TAGAMET) 400 MG tablet TAKE 1 TABLET TWICE A DAY AS NEEDED 180 tablet 3   denosumab (PROLIA) 60 MG/ML SOSY injection Inject 60 mg into the skin once.     loratadine (CLARITIN) 10 MG tablet Take 10 mg by mouth daily.     montelukast (SINGULAIR) 10 MG tablet TAKE 1 TABLET(10 MG) BY MOUTH AT BEDTIME 90 tablet 3   Multiple Vitamin (MULTIVITAMIN WITH MINERALS) TABS tablet Take 1 tablet by mouth daily.     pantoprazole (PROTONIX) 40 MG tablet TAKE 1 TABLET TWICE A DAY 180 tablet 3   polyethylene glycol (MIRALAX / GLYCOLAX) 17 g packet Take 17 g by mouth daily.     rosuvastatin (CRESTOR) 20 MG tablet TAKE 1 TABLET DAILY 90 tablet 3   zinc sulfate 220 (50 Zn) MG capsule Take 220 mg by mouth daily.      No current facility-administered medications for this visit.    Past Medical History:  Diagnosis Date   Blood dyscrasia    Breast cancer ([place])    left   Complication of anesthesia    1980S  TUBAL LIG, + HEMM SURGERY PT PASSED OUT AND WENT INTO SHOCK.  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    Endometriosis    Fibromyalgia    GERD (gastroesophageal reflux disease)    Hypercholesteremia    Mini stroke 09/2012   Radiculopathy    Stroke [facility])    MINI STROKE   Von Willebrand disease ([place]) 09/11/2013   Von Willebrand's disease [facility])    history of    Past Surgical History:  Procedure Laterality Date   ABDOMINAL HYSTERECTOMY     AUGMENTATION MAMMAPLASTY Right    BACK SURGERY     BREAST IMPLANT REMOVAL Right 12/05/2017   BREAST IMPLANT REMOVAL Right 12/05/2017   Procedure: REMOVAL RIGHT  BREAST IMPLANT MATERIAL;  Surgeon: Crissie Reese, MD;  Location: [place];  Service: Plastics;  Laterality: Right;   BREAST RECONSTRUCTION     BREAST RECONSTRUCTION Right 12/05/2017   Procedure: DELAYED BREAST RECONSTRUCTION WITH SILICONE IMPLANT;  Surgeon: Crissie Reese, MD;  Location: [place];  Service: Plastics;  Laterality: Right;   CARPAL TUNNEL RELEASE     right   COLONOSCOPY  01/2010   Dr. Aviva Signs: normal   COLONOSCOPY WITH PROPOFOL N/A 01/22/2019   Procedure: COLONOSCOPY WITH PROPOFOL;  Surgeon: Daneil Dolin, MD;  [facility];  Service: Endoscopy;  Laterality: N/A;  2:30pm - can not come earlier due to transportation   [place]     left   SHOULDER SURGERY     SKIN CANCER EXCISION     TONSILLECTOMY     TUBAL LIGATION  Family History  Problem Relation Age of Onset   Breast cancer Mother 67       now 89yo   Stroke Father        Father had no full sisters   Breast cancer Sister 10       now 40yo; had TAH/BSO   Lung cancer Brother        died 43yo; smoker    Allergies as of 01/09/2022 - Review Complete 01/09/2022  Allergen Reaction Noted   Aspirin Other (See Comments) 02/15/2011   Hydrocodone Itching 03/31/2018   Oxycodone Itching 03/31/2018   Tape Dermatitis 06/21/2015    Social History   Socioeconomic History   Marital status: Widowed    Spouse name: Not on file   Number of children: 1   Years of education: Not on file   Highest education level: Not on file  Occupational History   Not on file  Tobacco Use   Smoking status: Never   Smokeless tobacco: Never  Vaping Use   Vaping Use: Never used  Substance and Sexual Activity   Alcohol use: No   Drug use: No   Sexual activity: Not on file    Comment: husband recently died from myelodysplastic syndrome in January of 2014  Other Topics Concern   Not on file  Social History Narrative   1 Daughter, April   1 grandson-20, 1 granddaughter-4.   Keeps granddaughter during the week.   Social Determinants of Health   Financial Resource Strain: Low Risk  (09/08/2021)   Overall Financial Resource Strain (CARDIA)    Difficulty of Paying Living Expenses: Not hard at all  Food Insecurity: No Food  Insecurity (09/08/2021)   Hunger Vital Sign    Worried About Running Out of Food in the Last Year: Never true    Ran Out of Food in the Last Year: Never true  Transportation Needs: No Transportation Needs (09/08/2021)   PRAPARE - Hydrologist (Medical): No    Lack of Transportation (Non-Medical): No  Physical Activity: Sufficiently Active (09/08/2021)   Exercise Vital Sign    Days of Exercise per Week: 5 days    Minutes of Exercise per Session: 40 min  Stress: No Stress Concern Present (09/08/2021)   [place]    Feeling of Stress : Not at all  Social Connections: [place] (09/08/2021)   Social Connection and Isolation Panel [NHANES]    Frequency of Communication with Friends and Family: More than three times a week    Frequency of Social Gatherings with Friends and Family: More than three times a week    Attends Religious Services: More than 4 times per year    Active Member of [employer] or Organizations: Yes    Attends Music therapist: More than 4 times per year    Marital Status: Married  Human resources officer Violence: Not At Risk (09/08/2021)   Humiliation, Afraid, Rape, and Kick questionnaire    Fear of Current or Ex-Partner: No    Emotionally Abused: No    Physically Abused: No    Sexually Abused: No     Review of Systems   Gen: Denies any fever, chills, fatigue, weight loss, lack of appetite.  CV: Denies chest pain, heart palpitations, peripheral edema, syncope.  Resp: Denies shortness of breath at rest or with exertion. Denies wheezing or cough.  GI: see HPI GU : Denies urinary burning, urinary frequency, urinary hesitancy MS:  Denies joint pain, muscle weakness, cramps, or limitation of movement.  Derm: Denies rash, itching, dry skin Psych: Denies depression, anxiety, memory loss, and confusion Heme: Denies bruising, bleeding, and enlarged lymph  nodes.   Physical Exam   BP (!) 147/78 (BP Location: Right Arm, Patient Position: Sitting, Cuff Size: Normal)   Pulse 86   Temp 98.1 F (36.7 C) (Temporal)   Ht '5\' 5"'$  (1.651 m)   Wt 135 lb (61.2 kg)   SpO2 98%   BMI 22.47 kg/m   General:   Alert and oriented. Pleasant and cooperative. Well-nourished and well-developed.  Head:  Normocephalic and atraumatic. Eyes:  Without icterus, sclera clear and conjunctiva pink.  Ears:  Normal auditory acuity. Mouth:  No deformity or lesions, oral mucosa pink.  Lungs:  Clear to auscultation bilaterally. No wheezes, rales, or rhonchi. No distress.  Heart:  S1, S2 present without murmurs appreciated.  Abdomen:  +BS, soft, non-tender and non-distended. No HSM noted. No guarding or rebound. No masses appreciated.  Rectal:  Deferred  Msk:  Symmetrical without gross deformities. Normal posture. Extremities:  Without edema. Neurologic:  Alert and  oriented x4;  grossly normal neurologically. Skin:  Intact without significant lesions or rashes. Psych:  Alert and cooperative. Normal mood and affect.   Assessment   KRISLYNN GRONAU is a 71 y.o. female with a history of following von Willebrand disease, stroke, hypercholesterolemia, GERD, fibromyalgia, endometriosis, and breast cancer presenting today with constipation.   Constipation: Patient went to the ED earlier this month due to constipation.  She reported having episode of nausea, dry heaving, and sweating after starting on Cymbalta for her fibromyalgia.  She then soon after that began having constipation.  At this time she was taking MiraLAX every other day to control her constipation on a regular basis but since starting the Cymbalta she continues to have issues.  She states the ED recommended for her to begin taking 2 capfuls of MiraLAX daily and even with that and taking Dulcolax she was having incomplete emptying of stools as well as abdominal cramping and loud audible bowel sounds.  She reports  currently she is going every day but still not emptying stools are about a Bristol 6 without significant amounts and she is having to wipe frequently.  She reports spending about 10 minutes on the commode at a time due to trying to empty.  Given that she does not have any extra fiber supplementation in her diet I have recommended for her to begin Benefiber and given her hemorrhoid precautions.  We will trial Trulance 3 mg daily given that is on her formulary for her insurance.  I provided her with some samples today.  Advised to call with a progress report a couple weeks.  We will follow-up in the office in 3 months.  GERD: Currently on pantoprazole 40 mg twice daily and cimetidine 400 mg twice daily.  Fairly controlled on this regimen, does have to use Tums occasionally for breakthrough but not frequent.  We will continue current regimen.  PLAN    Trulance 3 mg daily.  Hemorrhoid precautions Continue pantoprazole and cimetidine.  Benefiber 2 teaspoons daily Follow up in 3 months.    Caroline Night, MSN, FNP-BC, AGACNP-BC [facility]

## 2022-01-09 NOTE — Patient Instructions (Addendum)
I am sending in Trulance 3 mg for you to take daily with or without food.  When she began taking this you may stop the MiraLAX.  To help with your constipation I also want you to begin taking a fiber supplement such as Benefiber, 2 teaspoons daily, you may increase to twice daily after 2 weeks.  For your hemorrhoids: continue to avoid straining. Limit toilet time to 2-3 minutes at the most.  Taking the Benefiber control your constipation will also help with this.  If your hemorrhoids become more bothersome and have repeated bleeding, we do offer hemorrhoid banding in the office now.  Please call me with a progress report in a couple weeks to let me know how your constipation is doing.  We will see you in the office in about 3 months.  I want to create trusting relationships with patients. If you receive a survey regarding your visit,  I greatly appreciate you taking time to fill this out on paper or through your MyChart. I value your feedback.  Venetia Night, MSN, FNP-BC, AGACNP-BC [facility]

## 2022-01-11 ENCOUNTER — Telehealth: Payer: Self-pay | Admitting: *Deleted

## 2022-01-11 NOTE — Telephone Encounter (Signed)
Received approval letter for Trulance. Sent copy to scan center.

## 2022-01-17 ENCOUNTER — Telehealth: Payer: Self-pay | Admitting: *Deleted

## 2022-01-17 ENCOUNTER — Other Ambulatory Visit: Payer: Self-pay | Admitting: Gastroenterology

## 2022-01-17 DIAGNOSIS — K5904 Chronic idiopathic constipation: Secondary | ICD-10-CM

## 2022-01-17 MED ORDER — LINACLOTIDE 145 MCG PO CAPS: 145.0000 ug | ORAL_CAPSULE | Freq: Every day | ORAL | 3 refills | Status: DC

## 2022-01-17 NOTE — Telephone Encounter (Signed)
Received denial letter from [place] for Trulance. Pt called and was informed. She would like to know what else she could take.

## 2022-01-18 DIAGNOSIS — H353131 Nonexudative age-related macular degeneration, bilateral, early dry stage: Secondary | ICD-10-CM | POA: Diagnosis not present

## 2022-01-18 NOTE — Telephone Encounter (Signed)
Linzess 144mg was approved.

## 2022-02-05 ENCOUNTER — Ambulatory Visit (HOSPITAL_COMMUNITY)
Admission: RE | Admit: 2022-02-05 | Discharge: 2022-02-05 | Disposition: A | Discharge status: Home / Self Care | Payer: Medicare Other | Source: Ambulatory Visit | Attending: Family Medicine | Admitting: Family Medicine

## 2022-02-05 ENCOUNTER — Ambulatory Visit (INDEPENDENT_AMBULATORY_CARE_PROVIDER_SITE_OTHER): Payer: Medicare Other | Admitting: Family Medicine

## 2022-02-05 VITALS — BP 138/82 | HR 92 | Temp 98.3°F | Ht 65.0 in | Wt 134.8 lb

## 2022-02-05 DIAGNOSIS — M25562 Pain in left knee: Secondary | ICD-10-CM | POA: Insufficient documentation

## 2022-02-05 DIAGNOSIS — M25532 Pain in left wrist: Secondary | ICD-10-CM | POA: Diagnosis not present

## 2022-02-05 DIAGNOSIS — M79632 Pain in left forearm: Secondary | ICD-10-CM | POA: Diagnosis not present

## 2022-02-05 MED ORDER — MELOXICAM 15 MG PO TABS: 15.0000 mg | ORAL_TABLET | Freq: Every day | ORAL | 5 refills | Status: DC

## 2022-02-05 NOTE — Progress Notes (Signed)
Subjective:    Patient ID: Caroline Valdez, female    DOB: Jun 24, 1950, 71 y.o.   MRN: [id number]  Wrist Pain   Leg Pain   Patient is a very sweet 71 year old Caucasian female with a history of fibromyalgia as well as von Willebrand's disease.  She presents today with pain in her distal left wrist over the ulna.  She believes that that area is swollen and tender to the touch.  She denies any falls or injuries.  She feels that that bony area has gotten larger over the last week or so.  She also complains of left knee pain particular over the medial joint line.  She has pain with flexion and extension of the knee.  There is no erythema or warmth or effusion. Past Medical History:  Diagnosis Date   Blood dyscrasia    Breast cancer ([place])    left   Complication of anesthesia    1980S  TUBAL LIG, + HEMM SURGERY PT PASSED OUT AND WENT INTO SHOCK.  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    Endometriosis    Fibromyalgia    GERD (gastroesophageal reflux disease)    Hypercholesteremia    Mini stroke 09/2012   Radiculopathy    Stroke [facility])    MINI STROKE   Von Willebrand disease ([place]) 09/11/2013   Von Willebrand's disease [facility])    history of   Past Surgical History:  Procedure Laterality Date   ABDOMINAL HYSTERECTOMY     AUGMENTATION MAMMAPLASTY Right    BACK SURGERY     BREAST IMPLANT REMOVAL Right 12/05/2017   BREAST IMPLANT REMOVAL Right 12/05/2017   Procedure: REMOVAL RIGHT  BREAST IMPLANT MATERIAL;  Surgeon: Crissie Reese, MD;  Location: [place];  Service: Plastics;  Laterality: Right;   BREAST RECONSTRUCTION     BREAST RECONSTRUCTION Right 12/05/2017   Procedure: DELAYED BREAST RECONSTRUCTION WITH SILICONE IMPLANT;  Surgeon: Crissie Reese, MD;  Location: [place];  Service: Plastics;  Laterality: Right;   CARPAL TUNNEL RELEASE     right   COLONOSCOPY  01/2010   Dr. Aviva Signs: normal   COLONOSCOPY WITH PROPOFOL N/A 01/22/2019   Procedure: COLONOSCOPY WITH  PROPOFOL;  Surgeon: Daneil Dolin, MD;  [facility];  Service: Endoscopy;  Laterality: N/A;  2:30pm - can not come earlier due to transportation   [place]     left   SHOULDER SURGERY     SKIN CANCER EXCISION     TONSILLECTOMY     TUBAL LIGATION     Current Outpatient Medications on File Prior to Visit  Medication Sig Dispense Refill   acetaminophen (TYLENOL) 500 MG tablet Take 1,000 mg by mouth daily as needed for headache.     ALPRAZolam (XANAX) 0.5 MG tablet TAKE 1 TABLET(0.5 MG) BY MOUTH AT BEDTIME AS NEEDED 60 tablet 2   cholecalciferol (VITAMIN D) 1000 UNITS tablet Take 1,000 Units by mouth daily.     cimetidine (TAGAMET) 400 MG tablet TAKE 1 TABLET TWICE A DAY AS NEEDED 180 tablet 3   denosumab (PROLIA) 60 MG/ML SOSY injection Inject 60 mg into the skin once.     linaclotide (LINZESS) 145 MCG CAPS capsule Take 1 capsule (145 mcg total) by mouth daily before breakfast. 30 capsule 3   loratadine (CLARITIN) 10 MG tablet Take  10 mg by mouth daily.     montelukast (SINGULAIR) 10 MG tablet TAKE 1 TABLET(10 MG) BY MOUTH AT BEDTIME 90 tablet 3   Multiple Vitamin (MULTIVITAMIN WITH MINERALS) TABS tablet Take 1 tablet by mouth daily.     pantoprazole (PROTONIX) 40 MG tablet TAKE 1 TABLET TWICE A DAY 180 tablet 3   rosuvastatin (CRESTOR) 20 MG tablet TAKE 1 TABLET DAILY 90 tablet 3   zinc sulfate 220 (50 Zn) MG capsule Take 220 mg by mouth daily.      No current facility-administered medications on file prior to visit.   Allergies  Allergen Reactions   Aspirin Other (See Comments)    Pt has Von Willebrands Disease   Hydrocodone Itching   Oxycodone Itching   Tape Dermatitis    Pulls off skin   Social History   Socioeconomic History   Marital status: Widowed    Spouse name: Not on file   Number of children: 1   Years of education: Not on file   Highest education level: Not on file   Occupational History   Not on file  Tobacco Use   Smoking status: Never   Smokeless tobacco: Never  Vaping Use   Vaping Use: Never used  Substance and Sexual Activity   Alcohol use: No   Drug use: No   Sexual activity: Not on file    Comment: husband recently died from myelodysplastic syndrome in January of 2014  Other Topics Concern   Not on file  Social History Narrative   1 Daughter, April   1 grandson-20, 1 granddaughter-4.   Keeps granddaughter during the week.   Social Determinants of Health   Financial Resource Strain: Low Risk  (09/08/2021)   Overall Financial Resource Strain (CARDIA)    Difficulty of Paying Living Expenses: Not hard at all  Food Insecurity: No Food Insecurity (09/08/2021)   Hunger Vital Sign    Worried About Running Out of Food in the Last Year: Never true    Ran Out of Food in the Last Year: Never true  Transportation Needs: No Transportation Needs (09/08/2021)   PRAPARE - Hydrologist (Medical): No    Lack of Transportation (Non-Medical): No  Physical Activity: Sufficiently Active (09/08/2021)   Exercise Vital Sign    Days of Exercise per Week: 5 days    Minutes of Exercise per Session: 40 min  Stress: No Stress Concern Present (09/08/2021)   [place]    Feeling of Stress : Not at all  Social Connections: [place] (09/08/2021)   Social Connection and Isolation Panel [NHANES]    Frequency of Communication with Friends and Family: More than three times a week    Frequency of Social Gatherings with Friends and Family: More than three times a week    Attends Religious Services: More than 4 times per year    Active Member of [employer] or Organizations: Yes    Attends Music therapist: More than 4 times per year    Marital Status: Married  Human resources officer Violence: Not At Risk (09/08/2021)   Humiliation, Afraid, Rape, and Kick  questionnaire    Fear of Current or Ex-Partner: No    Emotionally Abused: No    Physically Abused: No    Sexually Abused: No   Family History  Problem Relation Age of Onset   Breast cancer Mother 65       now 60yo  Stroke Father        Father had no full sisters   Breast cancer Sister 2       now 55yo; had TAH/BSO   Lung cancer Brother        died 34yo; smoker   Immunization History  Administered Date(s) Administered   Fluad Quad(high Dose 65+) 02/13/2019, 03/02/2021   Influenza Split 01/19/2014   Influenza Whole 03/07/2009, 03/06/2010   Influenza,inj,Quad PF,6+ Mos 01/26/2013, 02/17/2015, 01/28/2017, 02/06/2018   Influenza-Unspecified 03/09/2016, 04/04/2020   PFIZER(Purple Top)SARS-COV-2 Vaccination 06/25/2019, 07/16/2019   Pneumococcal Conjugate-13 10/22/2014   Pneumococcal Polysaccharide-23 09/07/2010, 03/23/2016   Td 12/25/2007   Tdap 12/25/2007, 04/17/2010, 12/11/2019   Zoster Recombinat (Shingrix) 07/23/2017, 10/22/2017   Zoster, Live 03/30/2010   Mammogram is up-to-date. Colonoscopy is up-to-date.  Does not require a Pap smear given the fact she's had a hysterectomy.    Review of Systems  All other systems reviewed and are negative.      Objective:   Physical Exam Vitals reviewed.  Constitutional:      General: She is not in acute distress.    Appearance: She is well-developed. She is not diaphoretic.  HENT:     Head: Normocephalic and atraumatic.  Neck:     Thyroid: No thyromegaly.     Vascular: No JVD.     Trachea: No tracheal deviation.  Cardiovascular:     Rate and Rhythm: Normal rate and regular rhythm.     Heart sounds: Normal heart sounds. No murmur heard.    No friction rub. No gallop.  Pulmonary:     Effort: Pulmonary effort is normal. No respiratory distress.     Breath sounds: Normal breath sounds. No stridor. No wheezing or rales.  Chest:     Chest wall: No tenderness.  Musculoskeletal:     Left wrist: Tenderness and bony tenderness  present. Decreased range of motion.     Cervical back: Normal range of motion and neck supple.     Right knee: No effusion. Normal range of motion. No tenderness.     Left knee: No effusion or erythema. Decreased range of motion. Tenderness present over the medial joint line.  Lymphadenopathy:     Cervical: No cervical adenopathy.  Skin:    General: Skin is warm.     Coloration: Skin is not pale.     Findings: No erythema or rash.  Neurological:     Mental Status: She is alert and oriented to person, place, and time.     Cranial Nerves: No cranial nerve deficit.     Motor: No abnormal muscle tone.     Coordination: Coordination normal.     Deep Tendon Reflexes: Reflexes normal.  Psychiatric:        Behavior: Behavior normal.        Thought Content: Thought content normal.        Judgment: Judgment normal.           Assessment & Plan:  Acute pain of left wrist - Plan: DG Forearm Left  Acute pain of left knee - Plan: DG Knee Complete 4 Views Left I suspect muscular neuritis of the wrist and in the knees complicated by fibromyalgia.  Obtain x-rays of the wrist to the knee to evaluate further at the wrist confirm arthritis and rule out any other skeletal causes, I would recommend trying meloxicam 15 mg daily for this.  Discussed the risk of GI bleeding on NSAIDs and also the increased risk of cardiovascular disease on  NSAIDs.  Recommended that she use them sparingly.

## 2022-02-12 ENCOUNTER — Other Ambulatory Visit: Payer: Self-pay | Admitting: Gastroenterology

## 2022-02-12 ENCOUNTER — Telehealth: Payer: Self-pay

## 2022-02-12 DIAGNOSIS — K5904 Chronic idiopathic constipation: Secondary | ICD-10-CM

## 2022-02-12 MED ORDER — LINACLOTIDE 145 MCG PO CAPS: 145.0000 ug | ORAL_CAPSULE | Freq: Every day | ORAL | 3 refills | Status: DC

## 2022-02-12 NOTE — Telephone Encounter (Signed)
Pt is changing pharmacies and is requesting [facility] Pt was last seen on 01/09/2022.

## 2022-02-14 ENCOUNTER — Telehealth: Payer: Self-pay | Admitting: *Deleted

## 2022-02-14 NOTE — Telephone Encounter (Signed)
Pt called and states can you please send a 90 day supply of Lin[facility] to [facility].

## 2022-02-15 ENCOUNTER — Other Ambulatory Visit: Payer: Self-pay | Admitting: Gastroenterology

## 2022-02-15 DIAGNOSIS — K5904 Chronic idiopathic constipation: Secondary | ICD-10-CM

## 2022-02-15 MED ORDER — LINACLOTIDE 145 MCG PO CAPS: 145.0000 ug | ORAL_CAPSULE | Freq: Every day | ORAL | 3 refills | Status: DC

## 2022-02-15 NOTE — Telephone Encounter (Signed)
Thank you :)

## 2022-03-06 DIAGNOSIS — L570 Actinic keratosis: Secondary | ICD-10-CM | POA: Diagnosis not present

## 2022-03-19 ENCOUNTER — Encounter: Payer: Self-pay | Admitting: Family Medicine

## 2022-03-19 ENCOUNTER — Ambulatory Visit (INDEPENDENT_AMBULATORY_CARE_PROVIDER_SITE_OTHER): Payer: Medicare Other | Admitting: Family Medicine

## 2022-03-19 VITALS — BP 142/88 | HR 86 | Ht 65.0 in | Wt 135.4 lb

## 2022-03-19 DIAGNOSIS — Z23 Encounter for immunization: Secondary | ICD-10-CM

## 2022-03-19 DIAGNOSIS — M810 Age-related osteoporosis without current pathological fracture: Secondary | ICD-10-CM | POA: Diagnosis not present

## 2022-03-19 DIAGNOSIS — I10 Essential (primary) hypertension: Secondary | ICD-10-CM | POA: Diagnosis not present

## 2022-03-19 MED ORDER — DENOSUMAB 60 MG/ML ~~LOC~~ SOSY
60.0000 mg | PREFILLED_SYRINGE | Freq: Once | SUBCUTANEOUS | Status: AC
  Administered 2022-03-19: 60 mg via SUBCUTANEOUS

## 2022-03-19 NOTE — Progress Notes (Signed)
Subjective:    Patient ID: Caroline Valdez, female    DOB: 1950/05/29, 71 y.o.   MRN: [id number]  Patient is here today for her regular checkup.  Past medical history significant for hyperlipidemia, fibromyalgia.  She is currently on meloxicam and tramadol for fibromyalgia.  She states that this combination makes life tolerable.  She denies any abdominal pain.  She denies any melena or hematochezia.  Her blood pressure today is elevated.  This is the second time her blood pressure has been slightly elevated.  She denies any chest pain or shortness of breath or dyspnea on exertion.  She is due for a flu shot as well as a COVID booster.  She agrees to receive a flu shot today but she is hesitant about the COVID booster.  She is also due for her Prolia  Past Medical History:  Diagnosis Date   Blood dyscrasia    Breast cancer ([place])    left   Complication of anesthesia    1980S  TUBAL LIG, + HEMM SURGERY PT PASSED OUT AND WENT INTO SHOCK.  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    Endometriosis    Fibromyalgia    GERD (gastroesophageal reflux disease)    Hypercholesteremia    Mini stroke 09/2012   Radiculopathy    Stroke [facility])    MINI STROKE   Von Willebrand disease ([place]) 09/11/2013   Von Willebrand's disease [facility])    history of   Past Surgical History:  Procedure Laterality Date   ABDOMINAL HYSTERECTOMY     AUGMENTATION MAMMAPLASTY Right    BACK SURGERY     BREAST IMPLANT REMOVAL Right 12/05/2017   BREAST IMPLANT REMOVAL Right 12/05/2017   Procedure: REMOVAL RIGHT  BREAST IMPLANT MATERIAL;  Surgeon: Crissie Reese, MD;  Location: [place];  Service: Plastics;  Laterality: Right;   BREAST RECONSTRUCTION     BREAST RECONSTRUCTION Right 12/05/2017   Procedure: DELAYED BREAST RECONSTRUCTION WITH SILICONE IMPLANT;  Surgeon: Crissie Reese, MD;  Location: [place];  Service: Plastics;  Laterality: Right;   CARPAL TUNNEL RELEASE     right   COLONOSCOPY  01/2010   Dr.  Aviva Signs: normal   COLONOSCOPY WITH PROPOFOL N/A 01/22/2019   Procedure: COLONOSCOPY WITH PROPOFOL;  Surgeon: Daneil Dolin, MD;  [facility];  Service: Endoscopy;  Laterality: N/A;  2:30pm - can not come earlier due to transportation   [place]     left   SHOULDER SURGERY     SKIN CANCER EXCISION     TONSILLECTOMY     TUBAL LIGATION     Current Outpatient Medications on File Prior to Visit  Medication Sig Dispense Refill   acetaminophen (TYLENOL) 500 MG tablet Take 1,000 mg by mouth daily as needed for headache.     ALPRAZolam (XANAX) 0.5 MG tablet TAKE 1 TABLET(0.5 MG) BY MOUTH AT BEDTIME AS NEEDED 60 tablet 2   cholecalciferol (VITAMIN D) 1000 UNITS tablet Take 1,000 Units by mouth daily.     cimetidine (TAGAMET) 400 MG tablet TAKE 1 TABLET TWICE A DAY AS NEEDED 180 tablet 3   linaclotide (LINZESS) 145 MCG CAPS capsule Take 1 capsule (145 mcg total) by mouth daily before breakfast. 90 capsule 3   loratadine (CLARITIN) 10 MG tablet Take 10 mg by mouth daily.  meloxicam (MOBIC) 15 MG tablet Take 1 tablet (15 mg total) by mouth daily. 30 tablet 5   montelukast (SINGULAIR) 10 MG tablet TAKE 1 TABLET(10 MG) BY MOUTH AT BEDTIME 90 tablet 3   Multiple Vitamin (MULTIVITAMIN WITH MINERALS) TABS tablet Take 1 tablet by mouth daily.     pantoprazole (PROTONIX) 40 MG tablet TAKE 1 TABLET TWICE A DAY 180 tablet 3   rosuvastatin (CRESTOR) 20 MG tablet TAKE 1 TABLET DAILY 90 tablet 3   zinc sulfate 220 (50 Zn) MG capsule Take 220 mg by mouth daily.      No current facility-administered medications on file prior to visit.   Allergies  Allergen Reactions   Aspirin Other (See Comments)    Pt has Von Willebrands Disease   Hydrocodone Itching   Oxycodone Itching   Tape Dermatitis    Pulls off skin   Social History   Socioeconomic History   Marital status: Widowed    Spouse name: Not on file    Number of children: 1   Years of education: Not on file   Highest education level: Not on file  Occupational History   Not on file  Tobacco Use   Smoking status: Never   Smokeless tobacco: Never  Vaping Use   Vaping Use: Never used  Substance and Sexual Activity   Alcohol use: No   Drug use: No   Sexual activity: Not on file    Comment: husband recently died from myelodysplastic syndrome in January of 2014  Other Topics Concern   Not on file  Social History Narrative   1 Daughter, April   1 grandson-20, 1 granddaughter-4.   Keeps granddaughter during the week.   Social Determinants of Health   Financial Resource Strain: Low Risk  (09/08/2021)   Overall Financial Resource Strain (CARDIA)    Difficulty of Paying Living Expenses: Not hard at all  Food Insecurity: No Food Insecurity (09/08/2021)   Hunger Vital Sign    Worried About Running Out of Food in the Last Year: Never true    Ran Out of Food in the Last Year: Never true  Transportation Needs: No Transportation Needs (09/08/2021)   PRAPARE - Hydrologist (Medical): No    Lack of Transportation (Non-Medical): No  Physical Activity: Sufficiently Active (09/08/2021)   Exercise Vital Sign    Days of Exercise per Week: 5 days    Minutes of Exercise per Session: 40 min  Stress: No Stress Concern Present (09/08/2021)   [place]    Feeling of Stress : Not at all  Social Connections: [place] (09/08/2021)   Social Connection and Isolation Panel [NHANES]    Frequency of Communication with Friends and Family: More than three times a week    Frequency of Social Gatherings with Friends and Family: More than three times a week    Attends Religious Services: More than 4 times per year    Active Member of [employer] or Organizations: Yes    Attends Archivist Meetings: More than 4 times per year    Marital Status: Married   Human resources officer Violence: Not At Risk (09/08/2021)   Humiliation, Afraid, Rape, and Kick questionnaire    Fear of Current or Ex-Partner: No    Emotionally Abused: No    Physically Abused: No    Sexually Abused: No   Family History  Problem Relation Age of Onset   Breast cancer Mother 17  now 82yo   Stroke Father        Father had no full sisters   Breast cancer Sister 2       now 65yo; had TAH/BSO   Lung cancer Brother        died 43yo; smoker   Immunization History  Administered Date(s) Administered   Fluad Quad(high Dose 65+) 02/13/2019, 03/02/2021, 03/19/2022   Influenza Split 01/19/2014   Influenza Whole 03/07/2009, 03/06/2010   Influenza,inj,Quad PF,6+ Mos 01/26/2013, 02/17/2015, 01/28/2017, 02/06/2018   Influenza-Unspecified 03/09/2016, 04/04/2020   PFIZER(Purple Top)SARS-COV-2 Vaccination 06/25/2019, 07/16/2019   Pneumococcal Conjugate-13 10/22/2014   Pneumococcal Polysaccharide-23 09/07/2010, 03/23/2016   Td 12/25/2007   Tdap 12/25/2007, 04/17/2010, 12/11/2019   Zoster Recombinat (Shingrix) 07/23/2017, 10/22/2017   Zoster, Live 03/30/2010   Mammogram is up-to-date. Colonoscopy is up-to-date.  Does not require a Pap smear given the fact she's had a hysterectomy.    Review of Systems  Gastrointestinal:  Positive for abdominal pain.  All other systems reviewed and are negative.      Objective:   Physical Exam Vitals reviewed.  Constitutional:      General: She is not in acute distress.    Appearance: She is well-developed. She is not diaphoretic.  HENT:     Head: Normocephalic and atraumatic.  Neck:     Thyroid: No thyromegaly.     Vascular: No JVD.     Trachea: No tracheal deviation.  Cardiovascular:     Rate and Rhythm: Normal rate and regular rhythm.     Heart sounds: Normal heart sounds. No murmur heard.    No friction rub. No gallop.  Pulmonary:     Effort: Pulmonary effort is normal. No respiratory distress.     Breath sounds: Normal  breath sounds. No stridor. No wheezing or rales.  Chest:     Chest wall: No tenderness.  Abdominal:     General: Abdomen is flat. Bowel sounds are normal. There is no distension.     Palpations: Abdomen is soft.     Tenderness: There is no abdominal tenderness. There is no guarding or rebound.  Musculoskeletal:        General: Normal range of motion.     Cervical back: Normal range of motion and neck supple.     Right knee: No effusion. Normal range of motion. Tenderness present over the medial joint line.     Left knee: No effusion. Normal range of motion. Tenderness present over the medial joint line.  Lymphadenopathy:     Cervical: No cervical adenopathy.  Skin:    General: Skin is warm.     Coloration: Skin is not pale.     Findings: No erythema or rash.  Neurological:     Mental Status: She is alert and oriented to person, place, and time.     Cranial Nerves: No cranial nerve deficit.     Motor: No abnormal muscle tone.     Coordination: Coordination normal.     Deep Tendon Reflexes: Reflexes normal.  Psychiatric:        Behavior: Behavior normal.        Thought Content: Thought content normal.        Judgment: Judgment normal.           Assessment & Plan:  Benign essential HTN - Plan: CBC with Differential/Platelet, Lipid panel, COMPLETE METABOLIC PANEL WITH GFR  Flu vaccine need - Plan: Flu Vaccine QUAD High Dose(Fluad)  Osteoporosis, post-menopausal - Plan: denosumab (PROLIA) injection 60 mg  Patient's blood pressure is elevated.  I have asked her to check her blood pressure daily for the next week and report the values and report them to me.  If her blood pressure is consistently greater than 140/90 we may discuss medication such as valsartan.  She received her flu shot today and I recommended a COVID booster.  She received her Prolia injection today.  I will check a CBC a CMP and lipid panel.  She is due for her next bone density next year

## 2022-03-20 LAB — LIPID PANEL
Cholesterol: 176 mg/dL (ref <200)
HDL: 63 mg/dL (ref >=50)
LDL Cholesterol (Calc): 90 mg/dL (calc)
Non-HDL Cholesterol (Calc): 113 mg/dL (calc) (ref <130)
Total CHOL/HDL Ratio: 2.8 (calc) (ref <5.0)
Triglycerides: 129 mg/dL (ref <150)

## 2022-03-20 LAB — CBC WITH DIFFERENTIAL/PLATELET
Absolute Monocytes: 530 cells/uL (ref 200–950)
Basophils Absolute: 40 cells/uL (ref 0–200)
Basophils Relative: 0.8 %
Eosinophils Absolute: 80 cells/uL (ref 15–500)
Eosinophils Relative: 1.6 %
HCT: 41.7 % (ref 35.0–45.0)
Hemoglobin: 14.2 g/dL (ref 11.7–15.5)
Lymphs Abs: 1725 cells/uL (ref 850–3900)
MCH: 32 pg (ref 27.0–33.0)
MCHC: 34.1 g/dL (ref 32.0–36.0)
MCV: 93.9 fL (ref 80.0–100.0)
MPV: 10.8 fL (ref 7.5–12.5)
Monocytes Relative: 10.6 %
Neutro Abs: 2625 cells/uL (ref 1500–7800)
Neutrophils Relative %: 52.5 %
Platelets: 200 10*3/uL (ref 140–400)
RBC: 4.44 10*6/uL (ref 3.80–5.10)
RDW: 12.6 % (ref 11.0–15.0)
Total Lymphocyte: 34.5 %
WBC: 5 10*3/uL (ref 3.8–10.8)

## 2022-03-20 LAB — COMPLETE METABOLIC PANEL WITH GFR
AG Ratio: 2 (calc) (ref 1.0–2.5)
ALT: 16 U/L (ref 6–29)
AST: 23 U/L (ref 10–35)
Albumin: 4.6 g/dL (ref 3.6–5.1)
Alkaline phosphatase (APISO): 39 U/L (ref 37–153)
BUN: 13 mg/dL (ref 7–25)
CO2: 29 mmol/L (ref 20–32)
Calcium: 9.5 mg/dL (ref 8.6–10.4)
Chloride: 105 mmol/L (ref 98–110)
Creat: 0.9 mg/dL (ref 0.60–1.00)
Globulin: 2.3 g/dL (calc) (ref 1.9–3.7)
Glucose, Bld: 93 mg/dL (ref 65–99)
Potassium: 4.2 mmol/L (ref 3.5–5.3)
Sodium: 143 mmol/L (ref 135–146)
Total Bilirubin: 0.8 mg/dL (ref 0.2–1.2)
Total Protein: 6.9 g/dL (ref 6.1–8.1)
eGFR: 68 mL/min/{1.73_m2} (ref >=60)

## 2022-04-02 ENCOUNTER — Encounter: Payer: Self-pay | Admitting: Family Medicine

## 2022-04-11 ENCOUNTER — Telehealth: Payer: Medicare Other | Admitting: Gastroenterology

## 2022-04-15 NOTE — Progress Notes (Signed)
Primary Care Physician:  Susy Frizzle, MD  Primary Gastroenterologist: Cristopher Estimable. Rourk, MD  Patient Location: Home Reason for Visit: follow up  Persons present on the virtual encounter, with roles: Patient - Caroline Valdez; Provider - Venetia Night, NP   Total time (minutes) spent on medical discussion: 15 minutes  Virtual Visit Encounter Note Visit is conducted virtually and was requested by patient.   I connected with Geneviene Tesch Tolson on 04/16/22 at  3:30 PM EST by video and verified that I am speaking with the correct person using two identifiers.   I discussed the limitations, risks, security and privacy concerns of performing an evaluation and management service by video and the availability of in person appointments. I also discussed with the patient that there may be a patient responsible charge related to this service. The patient expressed understanding and agreed to proceed.  Chief Complaint  Patient presents with   Follow-up    History of Present Illness: Caroline Valdez is a 71 y.o. female with a history of Von Willebrand disease, stroke, hypercholesterolemia, GERD, fibromyalgia, endometriosis, and breast cancer presenting today for follow up.   Colonoscopy September 2020 with diverticulosis in the sigmoid and descending colon, redundant colon, otherwise normal.  No repeat advised.    Last office visit 01/09/22. Had recently been seen in the ED for constipation after starting Cymbalta.  She tried Dulcolax and MiraLAX without any success.  She was given Fleet enema and had a moderate-sized bowel movement and improvement in symptoms.  She was advised to begin MiraLAX 1 capful twice daily or 2 capfuls daily and high-fiber diet.  Reported she was using antihemorrhoidal suppositories for occasional rectal bleeding.  Stated she was going every day but having [place] 6 stools.  States she is having to wipe multiple times while having a bowel movement.  Typically  spending 10 minutes on the commode at a time.  Denied any lack of appetite, weight loss, or dysphagia.  GERD was fairly well-controlled on Tums occasionally.  Continued on cimetidine twice daily.  Hemorrhoid precautions discussed, advised to try Trulance 3 mg daily, fiber supplementation, and pantoprazole and cimetidine.  Today: GERD - Still having some. Taking pantoprazole and cimetidine. Has not had to use tums in about 1 month.  Constipation - Has been taking Linzess but cough/congestion has been going on for about 2 months. That started after she began Linzess. Did not have these symptoms prior. Has not done any fiber supplements. If she has excessive fiber she gets constipated. Staying hydrated. Not needing to strain.   Hemorrhoids - They are better since she is having better bowel movements. Denies frequent rectal bleeding. Used a general preparation H suppository 2 weeks ago (typically has itching and burning).   Medications Current Meds  Medication Sig   acetaminophen (TYLENOL) 500 MG tablet Take 1,000 mg by mouth daily as needed for headache.   ALPRAZolam (XANAX) 0.5 MG tablet TAKE 1 TABLET(0.5 MG) BY MOUTH AT BEDTIME AS NEEDED   cholecalciferol (VITAMIN D) 1000 UNITS tablet Take 1,000 Units by mouth daily.   loratadine (CLARITIN) 10 MG tablet Take 10 mg by mouth daily.   lubiprostone (AMITIZA) 8 MCG capsule Take 1 capsule (8 mcg total) by mouth 2 (two) times daily with a meal.   montelukast (SINGULAIR) 10 MG tablet TAKE 1 TABLET(10 MG) BY MOUTH AT BEDTIME   Multiple Vitamin (MULTIVITAMIN WITH MINERALS) TABS tablet Take 1 tablet by mouth daily.   pantoprazole (PROTONIX) 40 MG  tablet TAKE 1 TABLET TWICE A DAY   rosuvastatin (CRESTOR) 20 MG tablet TAKE 1 TABLET DAILY   zinc sulfate 220 (50 Zn) MG capsule Take 220 mg by mouth daily.    [DISCONTINUED] linaclotide (LINZESS) 145 MCG CAPS capsule Take 1 capsule (145 mcg total) by mouth daily before breakfast.     History Past Medical  History:  Diagnosis Date   Blood dyscrasia    Breast cancer (HCC)    left   Complication of anesthesia    1980S  TUBAL LIG, + HEMM SURGERY PT PASSED OUT AND WENT INTO SHOCK.  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    Endometriosis    Fibromyalgia    GERD (gastroesophageal reflux disease)    Hypercholesteremia    Mini stroke 09/2012   Radiculopathy    Stroke [facility]    MINI STROKE   Von Willebrand disease ([place]) 09/11/2013   Von Willebrand's disease [facility])    history of    Past Surgical History:  Procedure Laterality Date   ABDOMINAL HYSTERECTOMY     AUGMENTATION MAMMAPLASTY Right    BACK SURGERY     BREAST IMPLANT REMOVAL Right 12/05/2017   BREAST IMPLANT REMOVAL Right 12/05/2017   Procedure: REMOVAL RIGHT  BREAST IMPLANT MATERIAL;  Surgeon: Crissie Reese, MD;  Location: [place];  Service: Plastics;  Laterality: Right;   BREAST RECONSTRUCTION     BREAST RECONSTRUCTION Right 12/05/2017   Procedure: DELAYED BREAST RECONSTRUCTION WITH SILICONE IMPLANT;  Surgeon: Crissie Reese, MD;  Location: [place];  Service: Plastics;  Laterality: Right;   CARPAL TUNNEL RELEASE     right   COLONOSCOPY  01/2010   Dr. Aviva Signs: normal   COLONOSCOPY WITH PROPOFOL N/A 01/22/2019   Procedure: COLONOSCOPY WITH PROPOFOL;  Surgeon: Daneil Dolin, MD;  [facility];  Service: Endoscopy;  Laterality: N/A;  2:30pm - can not come earlier due to transportation   [place]     left   SHOULDER SURGERY     SKIN CANCER EXCISION     TONSILLECTOMY     TUBAL LIGATION      Family History  Problem Relation Age of Onset   Breast cancer Mother 72       now 62yo   Stroke Father        Father had no full sisters   Breast cancer Sister 24       now 110yo; had TAH/BSO   Lung cancer Brother        died 73yo; smoker    Social History   Socioeconomic History   Marital status: Widowed    Spouse  name: Not on file   Number of children: 1   Years of education: Not on file   Highest education level: Not on file  Occupational History   Not on file  Tobacco Use   Smoking status: Never   Smokeless tobacco: Never  Vaping Use   Vaping Use: Never used  Substance and Sexual Activity   Alcohol use: No   Drug use: No   Sexual activity: Not on file    Comment: husband recently died from myelodysplastic syndrome in January of 2014  Other Topics Concern   Not on file  Social History Narrative   1 Daughter, April   1 grandson-20, 1 granddaughter-4.  Keeps granddaughter during the week.   Social Determinants of Health   Financial Resource Strain: Low Risk  (09/08/2021)   Overall Financial Resource Strain (CARDIA)    Difficulty of Paying Living Expenses: Not hard at all  Food Insecurity: No Food Insecurity (09/08/2021)   Hunger Vital Sign    Worried About Running Out of Food in the Last Year: Never true    Ran Out of Food in the Last Year: Never true  Transportation Needs: No Transportation Needs (09/08/2021)   PRAPARE - Hydrologist (Medical): No    Lack of Transportation (Non-Medical): No  Physical Activity: Sufficiently Active (09/08/2021)   Exercise Vital Sign    Days of Exercise per Week: 5 days    Minutes of Exercise per Session: 40 min  Stress: No Stress Concern Present (09/08/2021)   [place]    Feeling of Stress : Not at all  Social Connections: [place] (09/08/2021)   Social Connection and Isolation Panel [NHANES]    Frequency of Communication with Friends and Family: More than three times a week    Frequency of Social Gatherings with Friends and Family: More than three times a week    Attends Religious Services: More than 4 times per year    Active Member of [employer] or Organizations: Yes    Attends Music therapist: More than 4 times per year    Marital  Status: Married      Review of Systems: Gen: Denies fever, chills, anorexia. Denies fatigue, weakness, weight loss.  CV: Denies chest pain, palpitations, syncope, peripheral edema, and claudication. Resp: Denies dyspnea at rest, cough, wheezing, coughing up blood, and pleurisy. GI: see HPI Derm: Denies rash, itching, dry skin Psych: Denies depression, anxiety, memory loss, confusion. No homicidal or suicidal ideation.  Heme: Denies bruising, bleeding, and enlarged lymph nodes.  Observations/Objective: No distress. Alert and oriented. Pleasant. Well nourished. Normal mood and affect. Unable to perform complete physical exam due to video encounter.   Assessment:  Constipation: Has been doing well on Linzess, however she developed a cough and lingering nasal/sinus congestion that has been going on for about 2 months, correlating with the time that she started her Linzess.  She denies any symptoms being present prior.  Currently having a bowel movement about every day and not needing to strain.  She is staying hydrated and has not done any fiber given if she takes too much this actually caused her to be constipated.  Overall fairly happy with her results except for the chronic cough/congestion.  Given the side effect, we will trial Amitiza 8 mcg twice daily and stop her Linzess.  Will monitor for any recurrent or ongoing sinus congestion and cough.  May increase Amitiza to 16 mcg twice daily if not adequate enough.  Advised adequate hydration.  GERD: Fairly well-controlled on pantoprazole 40 mg twice daily and cimetidine 400 mg twice daily.  Still has occasional breakthrough symptoms however has not had to use Tums in about 1 month.  Hemorrhoids: Improved since increased frequency of bowel movements.  Currently not straining. Did note some itching/burning and used a generic Preparation H suppository for couple days for relief.  Denies any melena or BRBPR.  Plan:  Stop Linzess and start Amitiza  8 mcg twice daily. Continue over the counter anti hemorrhoidal suppositories as needed Adequate hydration Continue pantoprazole 40 mg twice daily Continue cimetidine 400 mg twice daily.  GERD diet  Hemorrhoid precautions   Follow Up Instructions: Follow up in 3-4 months or sooner if needed. Follow up if unable to obtain Amitiza.   I discussed the assessment and treatment plan with the patient. The patient was provided an opportunity to ask questions and all were answered. The patient agreed with the plan and demonstrated an understanding of the instructions.   The patient was advised to call back or seek an in-person evaluation if the symptoms worsen or if the condition fails to improve as anticipated.    Venetia Night, MSN, APRN, FNP-BC, AGACNP-BC [facility]

## 2022-04-16 ENCOUNTER — Telehealth (INDEPENDENT_AMBULATORY_CARE_PROVIDER_SITE_OTHER): Payer: Medicare Other | Admitting: Gastroenterology

## 2022-04-16 ENCOUNTER — Encounter: Payer: Self-pay | Admitting: Gastroenterology

## 2022-04-16 VITALS — Ht 65.5 in | Wt 134.0 lb

## 2022-04-16 DIAGNOSIS — K5904 Chronic idiopathic constipation: Secondary | ICD-10-CM | POA: Diagnosis not present

## 2022-04-16 DIAGNOSIS — K219 Gastro-esophageal reflux disease without esophagitis: Secondary | ICD-10-CM | POA: Diagnosis not present

## 2022-04-16 DIAGNOSIS — K649 Unspecified hemorrhoids: Secondary | ICD-10-CM | POA: Diagnosis not present

## 2022-04-16 MED ORDER — LUBIPROSTONE 8 MCG PO CAPS: 8.0000 ug | ORAL_CAPSULE | Freq: Two times a day (BID) | ORAL | 2 refills | Status: DC

## 2022-04-16 NOTE — Patient Instructions (Signed)
Stop Linzess, will send in Amitiza 8 mcg twice daily.  Amitiza is best taken with food to prevent nausea.  Ensure you are staying adequately hydrated.  If you have any issues obtaining the medication and begin to experience worsening constipation, you may continue over-the-counter stool softeners, laxatives, or suppositories as needed.  You may continue your over-the-counter antihemorrhoidal suppositories as needed for flares in your hemorrhoids.  Continue to avoid straining.  Limit toilet time to 2-3 minutes at a time.  Continue pantoprazole and cimetidine twice daily. Follow a GERD diet:  Avoid fried, fatty, greasy, spicy, citrus foods. Avoid caffeine and carbonated beverages. Avoid chocolate. Try eating 4-6 small meals a day rather than 3 large meals. Do not eat within 3 hours of laying down. Prop head of bed up on wood or bricks to create a 6 inch incline.  I hope you have a great Christmas and a happy new year!  We will have you follow-up in 3-4 months, or sooner if needed.  It was a pleasure to see you today. I want to create trusting relationships with patients. If you receive a survey regarding your visit,  I greatly appreciate you taking time to fill this out on paper or through your MyChart. I value your feedback.  Venetia Night, MSN, FNP-BC, AGACNP-BC [facility]

## 2022-04-27 ENCOUNTER — Telehealth: Payer: Self-pay | Admitting: *Deleted

## 2022-04-27 NOTE — Telephone Encounter (Signed)
Received approval letter for Lubiprostone 53mg. Informed pt of approval.

## 2022-04-30 ENCOUNTER — Ambulatory Visit (INDEPENDENT_AMBULATORY_CARE_PROVIDER_SITE_OTHER): Payer: Medicare Other | Admitting: Family Medicine

## 2022-04-30 VITALS — BP 128/74 | HR 87 | Temp 97.9°F | Ht 65.5 in | Wt 136.0 lb

## 2022-04-30 DIAGNOSIS — B9689 Other specified bacterial agents as the cause of diseases classified elsewhere: Secondary | ICD-10-CM

## 2022-04-30 DIAGNOSIS — J019 Acute sinusitis, unspecified: Secondary | ICD-10-CM | POA: Diagnosis not present

## 2022-04-30 MED ORDER — AMOXICILLIN-POT CLAVULANATE 875-125 MG PO TABS: 1.0000 | ORAL_TABLET | Freq: Two times a day (BID) | ORAL | 0 refills | Status: DC

## 2022-04-30 NOTE — Patient Instructions (Addendum)
  Some things that can make you feel better are: - Increased rest - Increasing fluid with water/sugar free electrolytes - Acetaminophen and ibuprofen as needed for fever/pain.  - Salt water gargling, chloraseptic spray and throat lozenges - OTC guaifenesin (Mucinex).  - Saline sinus flushes or a neti pot.  - Humidifying the air.

## 2022-04-30 NOTE — Assessment & Plan Note (Signed)
Patient has symptoms today consistent with bacterial sinus infection. It does sound like she has had a recent viral illness with resickening. Will start Augmentin BID for 10 days. Instructed to return to office if symptoms worsen or persist. Continue OTC symptoms management. ncrease fluid intake with water or electrolyte solution like pedialyte. Continue symptomatic management with Mucinex and Flonase. Encouraged saline sinus flushes and/or neti with humidified air.

## 2022-04-30 NOTE — Progress Notes (Signed)
Acute Office Visit  Subjective:     Patient ID: Caroline Valdez, female    DOB: 1950-12-31, 71 y.o.   MRN: [id number]  Chief Complaint  Patient presents with   Cough    Comes and goes for 2 months - dry cough , fatigued - taking dayquil and nyquil     HPI Patient is in today for 2 months cough, congestion and drainage, sinus pressure and headache today, sore throat, and fatigue. She has not previously been treated for this and presumed it was due to the [place] she started, she discontinued the Linzess a couple weeks ago but her symptoms are persisting. Her symptoms did improve then get worse again. Denies fever/chills/night sweats, shortness of breath, wheezing, chest pain, N/V/D. Her grandaughter did have Covid last week, patient had multiple negative Covid tests last week. Has tried, Nyquil, cold ease, and Dayquil.  Review of Systems  All other systems reviewed and are negative.   Past Medical History:  Diagnosis Date   Blood dyscrasia    Breast cancer ([place])    left   Complication of anesthesia    1980S  TUBAL LIG, + HEMM SURGERY PT PASSED OUT AND WENT INTO SHOCK.  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    Endometriosis    Fibromyalgia    GERD (gastroesophageal reflux disease)    Hypercholesteremia    Mini stroke 09/2012   Radiculopathy    Stroke [facility])    MINI STROKE   Von Willebrand disease ([place]) 09/11/2013   Von Willebrand's disease [facility])    history of   Past Surgical History:  Procedure Laterality Date   ABDOMINAL HYSTERECTOMY     AUGMENTATION MAMMAPLASTY Right    BACK SURGERY     BREAST IMPLANT REMOVAL Right 12/05/2017   BREAST IMPLANT REMOVAL Right 12/05/2017   Procedure: REMOVAL RIGHT  BREAST IMPLANT MATERIAL;  Surgeon: Crissie Reese, MD;  Location: [place];  Service: Plastics;  Laterality: Right;   BREAST RECONSTRUCTION     BREAST RECONSTRUCTION Right 12/05/2017   Procedure: DELAYED BREAST RECONSTRUCTION WITH SILICONE IMPLANT;   Surgeon: Crissie Reese, MD;  Location: [place];  Service: Plastics;  Laterality: Right;   CARPAL TUNNEL RELEASE     right   COLONOSCOPY  01/2010   Dr. Aviva Signs: normal   COLONOSCOPY WITH PROPOFOL N/A 01/22/2019   Procedure: COLONOSCOPY WITH PROPOFOL;  Surgeon: Daneil Dolin, MD;  [facility];  Service: Endoscopy;  Laterality: N/A;  2:30pm - can not come earlier due to transportation   [place]     left   SHOULDER SURGERY     SKIN CANCER EXCISION     TONSILLECTOMY     TUBAL LIGATION     Current Outpatient Medications on File Prior to Visit  Medication Sig Dispense Refill   acetaminophen (TYLENOL) 500 MG tablet Take 1,000 mg by mouth daily as needed for headache.     ALPRAZolam (XANAX) 0.5 MG tablet TAKE 1 TABLET(0.5 MG) BY MOUTH AT BEDTIME AS NEEDED 60 tablet 2   cholecalciferol (VITAMIN D) 1000 UNITS tablet Take 1,000 Units by mouth daily.     cimetidine (TAGAMET) 400 MG tablet TAKE 1 TABLET TWICE A DAY AS NEEDED 180 tablet 3   loratadine (CLARITIN) 10 MG tablet Take 10 mg by mouth daily.  montelukast (SINGULAIR) 10 MG tablet TAKE 1 TABLET(10 MG) BY MOUTH AT BEDTIME 90 tablet 3   Multiple Vitamin (MULTIVITAMIN WITH MINERALS) TABS tablet Take 1 tablet by mouth daily.     pantoprazole (PROTONIX) 40 MG tablet TAKE 1 TABLET TWICE A DAY 180 tablet 3   rosuvastatin (CRESTOR) 20 MG tablet TAKE 1 TABLET DAILY 90 tablet 3   zinc sulfate 220 (50 Zn) MG capsule Take 220 mg by mouth daily.      lubiprostone (AMITIZA) 8 MCG capsule Take 1 capsule (8 mcg total) by mouth 2 (two) times daily with a meal. (Patient not taking: Reported on 04/30/2022) 60 capsule 2   No current facility-administered medications on file prior to visit.   Allergies  Allergen Reactions   Aspirin Other (See Comments)    Pt has Von Willebrands Disease   Hydrocodone Itching   Oxycodone Itching   Tape Dermatitis    Pulls off  skin       Objective:    BP 128/74   Pulse 87   Temp 97.9 F (36.6 C)   Ht 5' 5.5" (1.664 m)   Wt 136 lb (61.7 kg)   SpO2 97%   BMI 22.29 kg/m    Physical Exam Vitals and nursing note reviewed.  Constitutional:      Appearance: Normal appearance. She is normal weight.  HENT:     Head: Normocephalic and atraumatic.     Right Ear: Tympanic membrane, ear canal and external ear normal.     Left Ear: There is impacted cerumen.     Nose: Nose normal.     Mouth/Throat:     Mouth: Mucous membranes are moist.     Pharynx: Posterior oropharyngeal erythema present.  Eyes:     Conjunctiva/sclera: Conjunctivae normal.  Cardiovascular:     Rate and Rhythm: Normal rate and regular rhythm.     Pulses: Normal pulses.     Heart sounds: Normal heart sounds.  Pulmonary:     Effort: Pulmonary effort is normal.     Breath sounds: Normal breath sounds.  Musculoskeletal:     Cervical back: Neck supple. No tenderness.  Lymphadenopathy:     Cervical: No cervical adenopathy.  Skin:    General: Skin is warm and dry.  Neurological:     General: No focal deficit present.     Mental Status: She is alert and oriented to person, place, and time. Mental status is at baseline.  Psychiatric:        Mood and Affect: Mood normal.        Behavior: Behavior normal.        Thought Content: Thought content normal.        Judgment: Judgment normal.     No results found for any visits on 04/30/22.      Assessment & Plan:   Problem List Items Addressed This Visit       Respiratory   Acute bacterial rhinosinusitis - Primary    Patient has symptoms today consistent with bacterial sinus infection. It does sound like she has had a recent viral illness with resickening. Will start Augmentin BID for 10 days. Instructed to return to office if symptoms worsen or persist. Continue OTC symptoms management. ncrease fluid intake with water or electrolyte solution like pedialyte. Continue symptomatic  management with Mucinex and Flonase. Encouraged saline sinus flushes and/or neti with humidified air.         Relevant Medications   amoxicillin-clavulanate (AUGMENTIN) 875-125 MG tablet  Meds ordered this encounter  Medications   amoxicillin-clavulanate (AUGMENTIN) 875-125 MG tablet    Sig: Take 1 tablet by mouth 2 (two) times daily.    Dispense:  20 tablet    Refill:  0    Order Specific Question:   Supervising Provider    Answer:   Jenna Luo T [[id number]]    Return if symptoms worsen or fail to improve.  Rubie Maid, FNP

## 2022-05-17 ENCOUNTER — Telehealth: Payer: Self-pay | Admitting: Family Medicine

## 2022-05-17 NOTE — Telephone Encounter (Signed)
Called and spoke w/pt regarding msg, per Amber,FNP, recommends pt to try Delsym OTC and see if that helps. If not improving for pt to call and schedule an OV.  Pt voiced understanding.

## 2022-05-17 NOTE — Telephone Encounter (Signed)
Patient called in requesting an appointment today she states that she has finished the antibiotic that she was given at last ov 04/30/22.  She states that she is still coughing and would like to know if there is something else she can take.  CB# [phone]

## 2022-05-18 ENCOUNTER — Telehealth: Payer: Self-pay | Admitting: *Deleted

## 2022-05-18 NOTE — Telephone Encounter (Signed)
Spoke to pt, informed her of recommendations. Pt voiced understanding.  Pt states she gets nauseated after she takes Amitiza, but will try 2 Amitiza in the morning and see if that helps with abdominal cramps. She states she has been using over the counter hemorrhoid cream and that does help. Informed to call office in a week with an update.

## 2022-05-18 NOTE — Telephone Encounter (Signed)
Pt called and stated that Lubiprostone makes her stomach cramp at night so she stop taking the evening dose. She still takes 1 Lubiprostone in the morning. She states she does not feel like the Lubiprostone is working as well as Linzess did, so now she is having problems with her hemorrhoids. Please advise.

## 2022-05-23 ENCOUNTER — Telehealth: Payer: Self-pay | Admitting: *Deleted

## 2022-05-23 ENCOUNTER — Other Ambulatory Visit: Payer: Self-pay | Admitting: Family Medicine

## 2022-05-23 DIAGNOSIS — Z0000 Encounter for general adult medical examination without abnormal findings: Secondary | ICD-10-CM

## 2022-05-23 DIAGNOSIS — F4321 Adjustment disorder with depressed mood: Secondary | ICD-10-CM

## 2022-05-23 NOTE — Telephone Encounter (Signed)
Spoke to pt, she informed me that she would like to try samples of Trulance first.

## 2022-05-23 NOTE — Telephone Encounter (Signed)
Pt called and states Lubiprostone is not working. She states she has been cramping for 2 days like she needs to have a BM, but only going a little. Thinks she would like to try Trulance.

## 2022-05-24 NOTE — Telephone Encounter (Signed)
Spoke to pt, informed her that samples of Trulance were left at front desk. Pt voiced understanding. Informed to call in a week with an update.

## 2022-05-29 ENCOUNTER — Telehealth: Payer: Self-pay | Admitting: *Deleted

## 2022-05-29 ENCOUNTER — Other Ambulatory Visit: Payer: Self-pay | Admitting: Gastroenterology

## 2022-05-29 DIAGNOSIS — K5904 Chronic idiopathic constipation: Secondary | ICD-10-CM

## 2022-05-29 MED ORDER — TRULANCE 3 MG PO TABS: 3.0000 mg | ORAL_TABLET | Freq: Every day | ORAL | 1 refills | Status: DC

## 2022-05-29 NOTE — Telephone Encounter (Signed)
Pt called and states she would like Trulance sent to [facility]. She states Trulance is working well for her.

## 2022-06-04 ENCOUNTER — Telehealth: Payer: Self-pay | Admitting: *Deleted

## 2022-06-04 NOTE — Telephone Encounter (Signed)
Received approval letter for Trulance '3mg'$ . Sent copy to scan center.

## 2022-06-21 ENCOUNTER — Other Ambulatory Visit: Payer: Self-pay | Admitting: Family Medicine

## 2022-06-22 NOTE — Telephone Encounter (Signed)
Last office visit 03/19/2022 so a year's supply given.  A glitch in the system does not pick up the last OV that's why protocol indicating there hasn't been an OV during the last 12 months.  Requested Prescriptions  Pending Prescriptions Disp Refills   cimetidine (TAGAMET) 400 MG tablet [Pharmacy Med Name: CIMETIDINE TABS '400MG'$ ] 180 tablet 3    Sig: TAKE 1 TABLET TWICE A DAY AS NEEDED     Gastroenterology:  H2 Antagonists - cimetidine Failed - 06/21/2022  3:32 PM      Failed - Valid encounter within last 12 months    Recent Outpatient Visits           9 months ago Pure hypercholesterolemia   [place] Pickard, Cammie Mcgee, MD   1 year ago Contusion of right periocular region, initial encounter   [place] Pickard, Cammie Mcgee, MD   1 year ago Pure hypercholesterolemia   [place] [facility] Schaumann, Cammie Mcgee, MD   1 year ago Acute bacterial rhinosinusitis   [place] Dennard Schaumann, Cammie Mcgee, MD   1 year ago History of TIA (transient ischemic attack)   [place], Cammie Mcgee, MD       Future Appointments             In 2 months Pickard, Cammie Mcgee, MD [facility]            Passed - Cr in normal range and within 360 days    Creat  Date Value Ref Range Status  03/19/2022 0.90 0.60 - 1.00 mg/dL Final

## 2022-06-29 ENCOUNTER — Encounter: Payer: Self-pay | Admitting: Gastroenterology

## 2022-07-06 ENCOUNTER — Encounter: Payer: Self-pay | Admitting: Family Medicine

## 2022-07-09 ENCOUNTER — Other Ambulatory Visit: Payer: Self-pay

## 2022-07-09 DIAGNOSIS — M810 Age-related osteoporosis without current pathological fracture: Secondary | ICD-10-CM

## 2022-07-18 ENCOUNTER — Other Ambulatory Visit (HOSPITAL_COMMUNITY): Payer: Self-pay | Admitting: Family Medicine

## 2022-07-18 DIAGNOSIS — Z1231 Encounter for screening mammogram for malignant neoplasm of breast: Secondary | ICD-10-CM

## 2022-07-18 NOTE — Progress Notes (Unsigned)
GI Office Note    Referring Provider: Susy Frizzle, MD Primary Care Physician:  Susy Frizzle, MD Primary Gastroenterologist: Cristopher Estimable.Rourk, MD   Date:  07/19/2022  ID:  LUGARDA IGNACIO, DOB 1950-07-05, MRN [id number]   Chief Complaint   Chief Complaint  Patient presents with   Follow-up   History of Present Illness  BIRDINE HANDLEY is a 72 y.o. female with a history of von Willebrand disease, stroke, hypercholesteremia, GERD, fibromyalgia, endometriosis, breast cancer presenting today for follow-up.  Colonoscopy September 2020 with diverticulosis in the sigmoid and descending colon, redundant colon, otherwise normal.  No repeat advised.  Last visit completed virtually 04/16/2022.  Taking pantoprazole and famotidine, still having reflux symptoms occasionally but has not had to use Tums in 1 month.  Has began having a cough at the beginning [place] therefore she stopped this.  Notes that she has too much fiber she gets constipated.  Proximal staying hydrated.  Denies any straining.  Hemorrhoid improves with having better bowel movements.  Denies any frequent rectal bleeding.  Using Preparation H suppository about 2 weeks prior to visit due to itching and burning.  Advised to start Amitiza 8 mcg twice daily.  Linzess.  Encouraged adequate hydration.  May use over-the-counter hemorrhoid suppositories as needed.  Continue PPI and cimetidine twice daily.  Advised to follow-up in 3-4 months, sooner if needed and to notify if unable to obtain Amitiza.  Patient reports stomach cramping with Amitiza she is only taking 1/day until it was not working as well as Linzess did.  Had flare of hemorrhoids during this process.  She was advised that she may take 2 tablets in the morning if that is coming up this to have better bowel movements or consider trial of Trulance.  Also noted nausea with Amitiza.  Over-the-counter hemorrhoid cream helping with hemorrhoids.  Patient ultimately requested to  trial Trulance.  Samples were left at the desk for her.  After almost 1 week she requested prescription.  Today: Constipation - Reports she is doing well on the Trulance. Having daily BM and stool are soft. No abdominal pain. Had middle of the night cramping with Amitiza and states it did not work. Still having her cough that she had even on the Linzess so she is going to see allergist next month for that. Did have good results with Linzess in the past as well although though she was having cough from that.   GERD - Is doing okay. Some days she has bad symptoms and will take tums and has good relief. Is taking PPI and cimetidine twice daily. No melena. Has good appetite. No unintentional weigh loss.  Denies early satiety, N/V, dysphagia.  Hemorrhoids - External ones bother her the same as always but nothing is nay worse. Sometime has burning and itching at the same time. Uses preparation H cream and suppositories. No rectal bleeding.   Current Outpatient Medications  Medication Sig Dispense Refill   ALPRAZolam (XANAX) 0.5 MG tablet TAKE 1 TABLET(0.5 MG) BY MOUTH AT BEDTIME AS NEEDED 60 tablet 1   cholecalciferol (VITAMIN D) 1000 UNITS tablet Take 1,000 Units by mouth daily.     cimetidine (TAGAMET) 400 MG tablet TAKE 1 TABLET TWICE A DAY AS NEEDED 180 tablet 3   loratadine (CLARITIN) 10 MG tablet Take 10 mg by mouth daily.     montelukast (SINGULAIR) 10 MG tablet TAKE 1 TABLET(10 MG) BY MOUTH AT BEDTIME 90 tablet 3   Multiple Vitamin (MULTIVITAMIN WITH  MINERALS) TABS tablet Take 1 tablet by mouth daily.     ondansetron (ZOFRAN) 4 MG tablet TAKE 1 TABLET(4 MG) BY MOUTH EVERY 8 HOURS AS NEEDED FOR NAUSEA OR VOMITING 20 tablet 0   pantoprazole (PROTONIX) 40 MG tablet TAKE 1 TABLET TWICE A DAY 180 tablet 3   Plecanatide (TRULANCE) 3 MG TABS Take 3 mg by mouth daily. 90 tablet 1   rosuvastatin (CRESTOR) 20 MG tablet TAKE 1 TABLET DAILY 90 tablet 3   zinc sulfate 220 (50 Zn) MG capsule Take 220 mg by  mouth daily.      acetaminophen (TYLENOL) 500 MG tablet Take 1,000 mg by mouth daily as needed for headache.     No current facility-administered medications for this visit.    Past Medical History:  Diagnosis Date   Blood dyscrasia    Breast cancer ([place])    left   Complication of anesthesia    1980S  TUBAL LIG, + HEMM SURGERY PT PASSED OUT AND WENT INTO SHOCK.  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    Endometriosis    Fibromyalgia    GERD (gastroesophageal reflux disease)    Hypercholesteremia    Mini stroke 09/2012   Radiculopathy    Stroke [facility])    MINI STROKE   Von Willebrand disease ([place]) 09/11/2013   Von Willebrand's disease [facility])    history of    Past Surgical History:  Procedure Laterality Date   ABDOMINAL HYSTERECTOMY     AUGMENTATION MAMMAPLASTY Right    BACK SURGERY     BREAST IMPLANT REMOVAL Right 12/05/2017   BREAST IMPLANT REMOVAL Right 12/05/2017   Procedure: REMOVAL RIGHT  BREAST IMPLANT MATERIAL;  Surgeon: Crissie Reese, MD;  Location: [place];  Service: Plastics;  Laterality: Right;   BREAST RECONSTRUCTION     BREAST RECONSTRUCTION Right 12/05/2017   Procedure: DELAYED BREAST RECONSTRUCTION WITH SILICONE IMPLANT;  Surgeon: Crissie Reese, MD;  Location: [place];  Service: Plastics;  Laterality: Right;   CARPAL TUNNEL RELEASE     right   COLONOSCOPY  01/2010   Dr. Aviva Signs: normal   COLONOSCOPY WITH PROPOFOL N/A 01/22/2019   Procedure: COLONOSCOPY WITH PROPOFOL;  Surgeon: Daneil Dolin, MD;  [facility];  Service: Endoscopy;  Laterality: N/A;  2:30pm - can not come earlier due to transportation   Seba Dalkai     left   SHOULDER SURGERY     SKIN CANCER EXCISION     TONSILLECTOMY     TUBAL LIGATION      Family History  Problem Relation Age of Onset   Breast cancer Mother 25       now 27yo   Stroke Father        Father had no full  sisters   Breast cancer Sister 58       now 58yo; had TAH/BSO   Lung cancer Brother        died 30yo; smoker    Allergies as of 07/19/2022 - Review Complete 07/19/2022  Allergen Reaction Noted   Aspirin Other (See Comments) 02/15/2011   Hydrocodone Itching 03/31/2018   Oxycodone Itching 03/31/2018   Tape Dermatitis 06/21/2015    Social History   Socioeconomic History   Marital status: Widowed    Spouse name: Not on file   Number of children:  1   Years of education: Not on file   Highest education level: Not on file  Occupational History   Not on file  Tobacco Use   Smoking status: Never   Smokeless tobacco: Never  Vaping Use   Vaping Use: Never used  Substance and Sexual Activity   Alcohol use: No   Drug use: No   Sexual activity: Not on file    Comment: husband recently died from myelodysplastic syndrome in January of 2014  Other Topics Concern   Not on file  Social History Narrative   1 Daughter, April   1 grandson-20, 1 granddaughter-4.   Keeps granddaughter during the week.   Social Determinants of Health   Financial Resource Strain: Low Risk  (09/08/2021)   Overall Financial Resource Strain (CARDIA)    Difficulty of Paying Living Expenses: Not hard at all  Food Insecurity: No Food Insecurity (09/08/2021)   Hunger Vital Sign    Worried About Running Out of Food in the Last Year: Never true    Ran Out of Food in the Last Year: Never true  Transportation Needs: No Transportation Needs (09/08/2021)   PRAPARE - Hydrologist (Medical): No    Lack of Transportation (Non-Medical): No  Physical Activity: Sufficiently Active (09/08/2021)   Exercise Vital Sign    Days of Exercise per Week: 5 days    Minutes of Exercise per Session: 40 min  Stress: No Stress Concern Present (09/08/2021)   [place]    Feeling of Stress : Not at all  Social Connections: [place]  (09/08/2021)   Social Connection and Isolation Panel [NHANES]    Frequency of Communication with Friends and Family: More than three times a week    Frequency of Social Gatherings with Friends and Family: More than three times a week    Attends Religious Services: More than 4 times per year    Active Member of [employer] or Organizations: Yes    Attends Music therapist: More than 4 times per year    Marital Status: Married     Review of Systems   Gen: Denies fever, chills, anorexia. Denies fatigue, weakness, weight loss.  CV: Denies chest pain, palpitations, syncope, peripheral edema, and claudication. Resp: Denies dyspnea at rest, cough, wheezing, coughing up blood, and pleurisy. GI: See HPI Derm: Denies rash, itching, dry skin Psych: Denies depression, anxiety, memory loss, confusion. No homicidal or suicidal ideation.  Heme: Denies bruising, bleeding, and enlarged lymph nodes.   Physical Exam   BP 121/75 (BP Location: Right Arm, Patient Position: Sitting, Cuff Size: Normal)   Pulse 82   Temp (!) 97.5 F (36.4 C) (Temporal)   Ht 5' 5.5" (1.664 m)   Wt 136 lb 6.4 oz (61.9 kg)   SpO2 98%   BMI 22.35 kg/m   General:   Alert and oriented. No distress noted. Pleasant and cooperative.  Head:  Normocephalic and atraumatic. Eyes:  Conjuctiva clear without scleral icterus. Mouth:  Oral mucosa pink and moist. Good dentition. No lesions. Lungs:  Clear to auscultation bilaterally. No wheezes, rales, or rhonchi. No distress.  Heart:  S1, S2 present without murmurs appreciated.  Abdomen:  +BS, soft, flat, non-tender and non-distended. No rebound or guarding. No HSM or masses noted. Rectal: deferred Msk:  Symmetrical without gross deformities. Normal posture. Extremities:  Without edema. Neurologic:  Alert and  oriented x4 Psych:  Alert and cooperative. Normal mood and  affect.   Assessment  SHEALIN ELIJAH is a 72 y.o. female with a history of von Willebrand disease,  stroke, hypercholesteremia, GERD, fibromyalgia, endometriosis, breast cancer presenting today for follow-up.  Constipation: Doing well on Trulance, having daily soft bowel movements.  Still has chronic cough which she thought was related to [place] however it has continued despite stopping.  We discussed that if Trulance stops being effective that we could go back to trying Linzess.  She has previously failed Amitiza due to abdominal cramping.  Hemorrhoids: Still having issues with her external hemorrhoids but is stable, no worsening.  Occasionally will have burning and itching at the same time but using over-the-counter Preparation H cream and suppositories is helpful.  No rectal bleeding.  GERD: Tums as needed with good relief.  Has continued on cimetidine twice daily daily.  Denies any lack of appetite, early satiety, unintentional weight loss, N/V, dysphagia.   PLAN   Cimetidine 400 mg twice daily as needed Continue pantoprazole 40 mg twice daily Continue Tums as needed GERD diet/lifestyle modifications Continue Trulance 3 mg once daily Continue otc preparation H as needed for hemorrhoids.  Follow up in 6 months.     Venetia Night, MSN, FNP-BC, AGACNP-BC [facility]

## 2022-07-19 ENCOUNTER — Encounter: Payer: Self-pay | Admitting: Gastroenterology

## 2022-07-19 ENCOUNTER — Ambulatory Visit (INDEPENDENT_AMBULATORY_CARE_PROVIDER_SITE_OTHER): Payer: Medicare Other | Admitting: Gastroenterology

## 2022-07-19 VITALS — BP 121/75 | HR 82 | Temp 97.5°F | Ht 65.5 in | Wt 136.4 lb

## 2022-07-19 DIAGNOSIS — K219 Gastro-esophageal reflux disease without esophagitis: Secondary | ICD-10-CM

## 2022-07-19 DIAGNOSIS — K649 Unspecified hemorrhoids: Secondary | ICD-10-CM | POA: Diagnosis not present

## 2022-07-19 DIAGNOSIS — K5904 Chronic idiopathic constipation: Secondary | ICD-10-CM | POA: Diagnosis not present

## 2022-07-19 NOTE — Patient Instructions (Addendum)
Continue cimetidine and pantoprazole twice daily.  He may continue to use Tums as needed for breakthrough.  In the future we can consider changing PPI to see if you have better results or if your more frequent.  Follow a GERD diet:  Avoid fried, fatty, greasy, spicy, citrus foods. Avoid caffeine and carbonated beverages. Avoid chocolate. Try eating 4-6 small meals a day rather than 3 large meals. Do not eat within 3 hours of laying down. Prop head of bed up on wood or bricks to create a 6 inch incline.  Continue to use over-the-counter Preparation H or similar as needed for hemorrhoids.  Continue Trulance 3 mg once daily.  Please let me know if you need a refill prior to your next office visit.  We will plan to follow-up in 6 months, sooner if needed.  Please hesitate to reach out if you have any questions or concerns.  It was a pleasure to see you today. I want to create trusting relationships with patients. If you receive a survey regarding your visit,  I greatly appreciate you taking time to fill this out on paper or through your MyChart. I value your feedback.  Venetia Night, MSN, FNP-BC, AGACNP-BC [facility]

## 2022-08-29 ENCOUNTER — Other Ambulatory Visit: Payer: Self-pay | Admitting: Family Medicine

## 2022-08-30 NOTE — Telephone Encounter (Signed)
Requested Prescriptions  Pending Prescriptions Disp Refills   rosuvastatin (CRESTOR) 20 MG tablet [Pharmacy Med Name: ROSUVASTATIN TABS 20MG ] 90 tablet 1    Sig: TAKE 1 TABLET DAILY     Cardiovascular:  Antilipid - Statins 2 Failed - 08/29/2022  5:45 PM      Failed - Valid encounter within last 12 months    Recent Outpatient Visits           11 months ago Pure hypercholesterolemia   [facility] Pickard, Priscille Heidelberg, MD   1 year ago Contusion of right periocular region, initial encounter   [facility] Pickard, Priscille Heidelberg, MD   1 year ago Pure hypercholesterolemia   [facility] Pickard, Priscille Heidelberg, MD   1 year ago Acute bacterial rhinosinusitis   [facility] Donita Brooks, MD   2 years ago History of TIA (transient ischemic attack)   [facility] Pickard, Priscille Heidelberg, MD       Future Appointments             In 2 weeks Tanya Nones, Priscille Heidelberg, MD [facility]            Failed - Lipid Panel in normal range within the last 12 months    Cholesterol  Date Value Ref Range Status  03/19/2022 176 <200 mg/dL Final   LDL Cholesterol (Calc)  Date Value Ref Range Status  03/19/2022 90 mg/dL (calc) Final    Comment:    Reference range: <100 . Desirable range <100 mg/dL for primary prevention;   <70 mg/dL for patients with CHD or diabetic patients  with > or = 2 CHD risk factors. Marland Kitchen LDL-C is now calculated using the Martin-Hopkins  calculation, which is a validated novel method providing  better accuracy than the Friedewald equation in the  estimation of LDL-C.  Horald Pollen et al. Lenox Ahr. 6468;032(12): 2061-2068  (http://education.[facility].com/faq/FAQ164)    HDL  Date Value Ref Range Status  03/19/2022 63 > OR = 50 mg/dL Final   Triglycerides  Date Value Ref Range Status  03/19/2022 129 <150 mg/dL Final         Passed - Cr in normal range and within 360 days     Creat  Date Value Ref Range Status  03/19/2022 0.90 0.60 - 1.00 mg/dL Final         Passed - Patient is not pregnant

## 2022-09-03 ENCOUNTER — Encounter (HOSPITAL_COMMUNITY): Payer: Self-pay

## 2022-09-03 ENCOUNTER — Ambulatory Visit (HOSPITAL_COMMUNITY)
Admission: RE | Admit: 2022-09-03 | Discharge: 2022-09-03 | Disposition: A | Discharge status: Home / Self Care | Payer: Medicare Other | Source: Ambulatory Visit | Attending: Family Medicine | Admitting: Family Medicine

## 2022-09-03 DIAGNOSIS — M810 Age-related osteoporosis without current pathological fracture: Secondary | ICD-10-CM | POA: Diagnosis not present

## 2022-09-03 DIAGNOSIS — Z1231 Encounter for screening mammogram for malignant neoplasm of breast: Secondary | ICD-10-CM | POA: Diagnosis not present

## 2022-09-04 DIAGNOSIS — L570 Actinic keratosis: Secondary | ICD-10-CM | POA: Diagnosis not present

## 2022-09-06 ENCOUNTER — Telehealth: Payer: Self-pay | Admitting: Family Medicine

## 2022-09-06 NOTE — Telephone Encounter (Signed)
Contacted Caroline Valdez to schedule their annual wellness visit. Patient declined to schedule AWV at this time.   Thank you,  Judeth Cornfield,  AMB Clinical Support [facility] AWV Program Direct Dial ??[phone]

## 2022-09-06 NOTE — Telephone Encounter (Signed)
Contacted Ruthe Roemer Priestly to schedule their annual wellness visit. Appointment made for 09/13/2022.  Thank you,  Judeth Cornfield,  AMB Clinical Support [facility] AWV Program Direct Dial ??[phone]

## 2022-09-10 ENCOUNTER — Encounter: Payer: Self-pay | Admitting: Family Medicine

## 2022-09-10 ENCOUNTER — Ambulatory Visit (INDEPENDENT_AMBULATORY_CARE_PROVIDER_SITE_OTHER): Payer: Medicare Other | Admitting: Family Medicine

## 2022-09-10 VITALS — BP 132/74 | HR 92 | Temp 98.5°F | Ht 65.5 in | Wt 134.0 lb

## 2022-09-10 DIAGNOSIS — R051 Acute cough: Secondary | ICD-10-CM

## 2022-09-10 MED ORDER — HYDROCODONE BIT-HOMATROP MBR 5-1.5 MG/5ML PO SOLN: 5.0000 mL | Freq: Three times a day (TID) | ORAL | 0 refills | Status: DC | PRN

## 2022-09-10 MED ORDER — PREDNISONE 20 MG PO TABS: ORAL_TABLET | ORAL | 0 refills | Status: DC

## 2022-09-10 NOTE — Progress Notes (Signed)
Subjective:    Patient ID: Caroline Valdez, female    DOB: 03-24-1951, 72 y.o.   MRN: [id number]   Patient is a very pleasant 71 year old Caucasian female who presents today complaining of a cough for more than a week.  She denies any fevers or chills.  The cough is nonproductive.  It is more of a tickle itching cough in the back of her throat.  She reports rhinorrhea and sinus congestion.  She is suffering from allergies despite taking antihistamines.  She is coughed so much that she is sore from coughing but she denies any purulent sputum or hemoptysis or shortness of breath.  She denies any fevers or chills. Past Medical History:  Diagnosis Date   Blood dyscrasia    Breast cancer    left   Complication of anesthesia    1980S  TUBAL LIG, + HEMM SURGERY PT PASSED OUT AND WENT INTO SHOCK.  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    Endometriosis    Fibromyalgia    GERD (gastroesophageal reflux disease)    Hypercholesteremia    Mini stroke 09/2012   Radiculopathy    Stroke    MINI STROKE   Von Willebrand disease 09/11/2013   Von Willebrand's disease    history of   Past Surgical History:  Procedure Laterality Date   ABDOMINAL HYSTERECTOMY     AUGMENTATION MAMMAPLASTY Right    BACK SURGERY     BREAST IMPLANT REMOVAL Right 12/05/2017   BREAST IMPLANT REMOVAL Right 12/05/2017   Procedure: REMOVAL RIGHT  BREAST IMPLANT MATERIAL;  Surgeon: Etter Sjogren, MD;  Location: [facility] OR;  Service: Plastics;  Laterality: Right;   BREAST RECONSTRUCTION     BREAST RECONSTRUCTION Right 12/05/2017   Procedure: DELAYED BREAST RECONSTRUCTION WITH SILICONE IMPLANT;  Surgeon: Etter Sjogren, MD;  Location: [facility] OR;  Service: Plastics;  Laterality: Right;   CARPAL TUNNEL RELEASE     right   COLONOSCOPY  01/2010   Dr. Franky Macho: normal   COLONOSCOPY WITH PROPOFOL N/A 01/22/2019   Procedure: COLONOSCOPY WITH PROPOFOL;  Surgeon: Corbin Ade, MD;  [facility];   Service: Endoscopy;  Laterality: N/A;  2:30pm - can not come earlier due to transportation   FINGER SURGERY     LEFT THUMB   HEMORRHOID SURGERY     X2   LAMINECTOMY     MASTECTOMY     left   SHOULDER SURGERY     SKIN CANCER EXCISION     TONSILLECTOMY     TUBAL LIGATION     Current Outpatient Medications on File Prior to Visit  Medication Sig Dispense Refill   acetaminophen (TYLENOL) 500 MG tablet Take 1,000 mg by mouth daily as needed for headache.     ALPRAZolam (XANAX) 0.5 MG tablet TAKE 1 TABLET(0.5 MG) BY MOUTH AT BEDTIME AS NEEDED 60 tablet 1   cholecalciferol (VITAMIN D) 1000 UNITS tablet Take 1,000 Units by mouth daily.     cimetidine (TAGAMET) 400 MG tablet TAKE 1 TABLET TWICE A DAY AS NEEDED 180 tablet 3   loratadine (CLARITIN) 10 MG tablet Take 10 mg by mouth daily.     montelukast (SINGULAIR) 10 MG tablet TAKE 1 TABLET(10 MG) BY MOUTH AT BEDTIME 90 tablet 3   Multiple Vitamin (MULTIVITAMIN WITH MINERALS) TABS tablet Take 1 tablet by mouth daily.     ondansetron (ZOFRAN) 4 MG tablet TAKE 1 TABLET(4 MG) BY MOUTH EVERY 8 HOURS AS  NEEDED FOR NAUSEA OR VOMITING 20 tablet 0   pantoprazole (PROTONIX) 40 MG tablet TAKE 1 TABLET TWICE A DAY 180 tablet 3   Plecanatide (TRULANCE) 3 MG TABS Take 3 mg by mouth daily. 90 tablet 1   rosuvastatin (CRESTOR) 20 MG tablet TAKE 1 TABLET DAILY 90 tablet 1   zinc sulfate 220 (50 Zn) MG capsule Take 220 mg by mouth daily.      No current facility-administered medications on file prior to visit.   Allergies  Allergen Reactions   Aspirin Other (See Comments)    Pt has Von Willebrands Disease   Hydrocodone Itching   Oxycodone Itching   Tape Dermatitis    Pulls off skin   Social History   Socioeconomic History   Marital status: Widowed    Spouse name: Not on file   Number of children: 1   Years of education: Not on file   Highest education level: Not on file  Occupational History   Not on file  Tobacco Use   Smoking status: Never    Smokeless tobacco: Never  Vaping Use   Vaping Use: Never used  Substance and Sexual Activity   Alcohol use: No   Drug use: No   Sexual activity: Not on file    Comment: husband recently died from myelodysplastic syndrome in January of 2014  Other Topics Concern   Not on file  Social History Narrative   1 Daughter, April   1 grandson-20, 1 granddaughter-4.   Keeps granddaughter during the week.   Social Determinants of Health   Financial Resource Strain: Low Risk  (09/08/2021)   Overall Financial Resource Strain (CARDIA)    Difficulty of Paying Living Expenses: Not hard at all  Food Insecurity: No Food Insecurity (09/08/2021)   Hunger Vital Sign    Worried About Running Out of Food in the Last Year: Never true    Ran Out of Food in the Last Year: Never true  Transportation Needs: No Transportation Needs (09/08/2021)   PRAPARE - Administrator, Civil Service (Medical): No    Lack of Transportation (Non-Medical): No  Physical Activity: Sufficiently Active (09/08/2021)   Exercise Vital Sign    Days of Exercise per Week: 5 days    Minutes of Exercise per Session: 40 min  Stress: No Stress Concern Present (09/08/2021)   [employer] of Occupational Health - Occupational Stress Questionnaire    Feeling of Stress : Not at all  Social Connections: Socially Integrated (09/08/2021)   Social Connection and Isolation Panel [NHANES]    Frequency of Communication with Friends and Family: More than three times a week    Frequency of Social Gatherings with Friends and Family: More than three times a week    Attends Religious Services: More than 4 times per year    Active Member of [employer] or Organizations: Yes    Attends Engineer, structural: More than 4 times per year    Marital Status: Married  Catering manager Violence: Not At Risk (09/08/2021)   Humiliation, Afraid, Rape, and Kick questionnaire    Fear of Current or Ex-Partner: No    Emotionally Abused: No     Physically Abused: No    Sexually Abused: No   Family History  Problem Relation Age of Onset   Breast cancer Mother 65       now 6yo   Stroke Father        Father had no full sisters   Breast cancer  Sister 63       now 70yo; had TAH/BSO   Lung cancer Brother        died 74yo; smoker     Review of Systems  All other systems reviewed and are negative.      Objective:   Physical Exam Vitals reviewed.  Constitutional:      General: She is not in acute distress.    Appearance: She is well-developed. She is not diaphoretic.  HENT:     Head: Normocephalic and atraumatic.     Right Ear: Tympanic membrane and ear canal normal.     Left Ear: Tympanic membrane and ear canal normal.     Nose: Mucosal edema and rhinorrhea present.     Right Turbinates: Swollen. Not enlarged.     Left Turbinates: Swollen. Not enlarged.     Right Sinus: No maxillary sinus tenderness or frontal sinus tenderness.     Left Sinus: No maxillary sinus tenderness or frontal sinus tenderness.  Eyes:     Conjunctiva/sclera: Conjunctivae normal.  Cardiovascular:     Rate and Rhythm: Normal rate and regular rhythm.     Heart sounds: Normal heart sounds. No murmur heard.    No friction rub. No gallop.  Pulmonary:     Effort: Pulmonary effort is normal. No respiratory distress.     Breath sounds: Normal breath sounds. No wheezing or rales.  Chest:     Chest wall: No tenderness.           Assessment & Plan:  Acute cough I believe that the patient is dealing with a cough due to postnasal drip and sinusitis from allergies.  She is failing and antihistamines I will start the patient on a prednisone taper pack.  Also use Hycodan 1 teaspoon every 8 hours as needed for cough.  If she develops fever or purulent sputum I would add an antibiotic but at the present time the patient does not appear to be dealing with an illness is much as sinus congestion from allergies

## 2022-09-18 ENCOUNTER — Other Ambulatory Visit: Payer: Self-pay | Admitting: Family Medicine

## 2022-09-18 ENCOUNTER — Encounter: Payer: Self-pay | Admitting: Family Medicine

## 2022-09-18 ENCOUNTER — Ambulatory Visit: Payer: Medicare Other | Admitting: Family Medicine

## 2022-09-18 ENCOUNTER — Ambulatory Visit (INDEPENDENT_AMBULATORY_CARE_PROVIDER_SITE_OTHER): Payer: Medicare Other | Admitting: Family Medicine

## 2022-09-18 VITALS — BP 132/72 | HR 81 | Temp 98.2°F | Ht 65.5 in | Wt 135.2 lb

## 2022-09-18 DIAGNOSIS — I10 Essential (primary) hypertension: Secondary | ICD-10-CM

## 2022-09-18 DIAGNOSIS — D6800 Von Willebrand disease, unspecified: Secondary | ICD-10-CM

## 2022-09-18 DIAGNOSIS — M810 Age-related osteoporosis without current pathological fracture: Secondary | ICD-10-CM

## 2022-09-18 DIAGNOSIS — E7800 Pure hypercholesterolemia, unspecified: Secondary | ICD-10-CM

## 2022-09-18 DIAGNOSIS — Z0001 Encounter for general adult medical examination with abnormal findings: Secondary | ICD-10-CM | POA: Diagnosis not present

## 2022-09-18 DIAGNOSIS — Z853 Personal history of malignant neoplasm of breast: Secondary | ICD-10-CM

## 2022-09-18 DIAGNOSIS — Z8673 Personal history of transient ischemic attack (TIA), and cerebral infarction without residual deficits: Secondary | ICD-10-CM | POA: Diagnosis not present

## 2022-09-18 DIAGNOSIS — Z0000 Encounter for general adult medical examination without abnormal findings: Secondary | ICD-10-CM

## 2022-09-18 MED ORDER — BENZONATATE 200 MG PO CAPS: 200.0000 mg | ORAL_CAPSULE | Freq: Three times a day (TID) | ORAL | 0 refills | Status: DC | PRN

## 2022-09-18 NOTE — Progress Notes (Signed)
Subjective:    Patient ID: Caroline Valdez, female    DOB: Sep 24, 1950, 72 y.o.   MRN: [id number]  HPI Patient is a very sweet 72 year old Caucasian female here today for complete physical exam.  Patient recently had her mammogram and it was normal.  Her last bone density has also been recently performed.  This showed continued improvement in her T-score.  Her T-score is now better than -2 in both hips.  She is currently on the Prolia shot.  We discussed taking a break from the bisphosphonate therapy however given that steady improvement, she prefers to continue the shots for the present time.  She has a history of a hysterectomy and therefore does not require Pap smear.  Her last colonoscopy was in 2020 and was clear except for diverticulosis so she does not require repeat colonoscopy.   Her immunizations are up to date except for COVID booster which she politely declines.  Patient denies any recent falls, memory loss, or depression Immunization History  Administered Date(s) Administered   Fluad Quad(high Dose 65+) 02/13/2019, 03/02/2021, 03/19/2022   Influenza Split 01/19/2014   Influenza Whole 03/07/2009, 03/06/2010   Influenza,inj,Quad PF,6+ Mos 01/26/2013, 02/17/2015, 01/28/2017, 02/06/2018   Influenza-Unspecified 03/09/2016, 04/04/2020   PFIZER(Purple Top)SARS-COV-2 Vaccination 06/25/2019, 07/16/2019   Pneumococcal Conjugate-13 10/22/2014   Pneumococcal Polysaccharide-23 09/07/2010, 03/23/2016   Td 12/25/2007   Tdap 12/25/2007, 04/17/2010, 12/11/2019   Zoster Recombinat (Shingrix) 07/23/2017, 10/22/2017   Zoster, Live 03/30/2010    Past Medical History:  Diagnosis Date   Blood dyscrasia    Breast cancer (HCC)    left   Complication of anesthesia    1980S  TUBAL LIG, + HEMM SURGERY PT PASSED OUT AND WENT INTO SHOCK.  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    Endometriosis    Fibromyalgia    GERD (gastroesophageal reflux disease)     Hypercholesteremia    Mini stroke 09/2012   Radiculopathy    Stroke [facility])    MINI STROKE   Von Willebrand disease (HCC) 09/11/2013   Von Willebrand's disease [facility])    history of   Past Surgical History:  Procedure Laterality Date   ABDOMINAL HYSTERECTOMY     AUGMENTATION MAMMAPLASTY Right    BACK SURGERY     BREAST IMPLANT REMOVAL Right 12/05/2017   BREAST IMPLANT REMOVAL Right 12/05/2017   Procedure: REMOVAL RIGHT  BREAST IMPLANT MATERIAL;  Surgeon: Etter Sjogren, MD;  Location: [facility] OR;  Service: Plastics;  Laterality: Right;   BREAST RECONSTRUCTION     BREAST RECONSTRUCTION Right 12/05/2017   Procedure: DELAYED BREAST RECONSTRUCTION WITH SILICONE IMPLANT;  Surgeon: Etter Sjogren, MD;  Location: [facility] OR;  Service: Plastics;  Laterality: Right;   CARPAL TUNNEL RELEASE     right   COLONOSCOPY  01/2010   Dr. Franky Macho: normal   COLONOSCOPY WITH PROPOFOL N/A 01/22/2019   Procedure: COLONOSCOPY WITH PROPOFOL;  Surgeon: Corbin Ade, MD;  [facility];  Service: Endoscopy;  Laterality: N/A;  2:30pm - can not come earlier due to transportation   FINGER SURGERY     LEFT THUMB   HEMORRHOID SURGERY     X2   LAMINECTOMY     MASTECTOMY     left   SHOULDER SURGERY     SKIN CANCER EXCISION     TONSILLECTOMY     TUBAL LIGATION     Current Outpatient Medications on File Prior to Visit  Medication Sig Dispense Refill  acetaminophen (TYLENOL) 500 MG tablet Take 1,000 mg by mouth daily as needed for headache.     ALPRAZolam (XANAX) 0.5 MG tablet TAKE 1 TABLET(0.5 MG) BY MOUTH AT BEDTIME AS NEEDED 60 tablet 1   cholecalciferol (VITAMIN D) 1000 UNITS tablet Take 1,000 Units by mouth daily.     cimetidine (TAGAMET) 400 MG tablet TAKE 1 TABLET TWICE A DAY AS NEEDED 180 tablet 3   HYDROcodone bit-homatropine (HYCODAN) 5-1.5 MG/5ML syrup Take 5 mLs by mouth every 8 (eight) hours as needed for cough. 120 mL 0   loratadine (CLARITIN) 10 MG tablet Take 10 mg by mouth daily.      montelukast (SINGULAIR) 10 MG tablet TAKE 1 TABLET(10 MG) BY MOUTH AT BEDTIME 90 tablet 3   Multiple Vitamin (MULTIVITAMIN WITH MINERALS) TABS tablet Take 1 tablet by mouth daily.     ondansetron (ZOFRAN) 4 MG tablet TAKE 1 TABLET(4 MG) BY MOUTH EVERY 8 HOURS AS NEEDED FOR NAUSEA OR VOMITING 20 tablet 0   pantoprazole (PROTONIX) 40 MG tablet TAKE 1 TABLET TWICE A DAY 180 tablet 3   Plecanatide (TRULANCE) 3 MG TABS Take 3 mg by mouth daily. 90 tablet 1   rosuvastatin (CRESTOR) 20 MG tablet TAKE 1 TABLET DAILY 90 tablet 1   zinc sulfate 220 (50 Zn) MG capsule Take 220 mg by mouth daily.      No current facility-administered medications on file prior to visit.   Allergies  Allergen Reactions   Aspirin Other (See Comments)    Pt has Von Willebrands Disease   Hydrocodone Itching   Oxycodone Itching   Tape Dermatitis    Pulls off skin   Social History   Socioeconomic History   Marital status: Widowed    Spouse name: Not on file   Number of children: 1   Years of education: Not on file   Highest education level: Not on file  Occupational History   Not on file  Tobacco Use   Smoking status: Never   Smokeless tobacco: Never  Vaping Use   Vaping Use: Never used  Substance and Sexual Activity   Alcohol use: No   Drug use: No   Sexual activity: Not on file    Comment: husband recently died from myelodysplastic syndrome in January of 2014  Other Topics Concern   Not on file  Social History Narrative   1 Daughter, April   1 grandson-20, 1 granddaughter-4.   Keeps granddaughter during the week.   Social Determinants of Health   Financial Resource Strain: Low Risk  (09/08/2021)   Overall Financial Resource Strain (CARDIA)    Difficulty of Paying Living Expenses: Not hard at all  Food Insecurity: No Food Insecurity (09/08/2021)   Hunger Vital Sign    Worried About Running Out of Food in the Last Year: Never true    Ran Out of Food in the Last Year: Never true  Transportation  Needs: No Transportation Needs (09/08/2021)   PRAPARE - Administrator, Civil Service (Medical): No    Lack of Transportation (Non-Medical): No  Physical Activity: Sufficiently Active (09/08/2021)   Exercise Vital Sign    Days of Exercise per Week: 5 days    Minutes of Exercise per Session: 40 min  Stress: No Stress Concern Present (09/08/2021)   [employer] of Occupational Health - Occupational Stress Questionnaire    Feeling of Stress : Not at all  Social Connections: Socially Integrated (09/08/2021)   Social Connection and Isolation Panel [NHANES]  Frequency of Communication with Friends and Family: More than three times a week    Frequency of Social Gatherings with Friends and Family: More than three times a week    Attends Religious Services: More than 4 times per year    Active Member of Clubs or Organizations: Yes    Attends Banker Meetings: More than 4 times per year    Marital Status: Married  Catering manager Violence: Not At Risk (09/08/2021)   Humiliation, Afraid, Rape, and Kick questionnaire    Fear of Current or Ex-Partner: No    Emotionally Abused: No    Physically Abused: No    Sexually Abused: No   Family History  Problem Relation Age of Onset   Breast cancer Mother 26       now 73yo   Stroke Father        Father had no full sisters   Breast cancer Sister 43       now 44yo; had TAH/BSO   Lung cancer Brother        died 31yo; smoker   Immunization History  Administered Date(s) Administered   Fluad Quad(high Dose 65+) 02/13/2019, 03/02/2021, 03/19/2022   Influenza Split 01/19/2014   Influenza Whole 03/07/2009, 03/06/2010   Influenza,inj,Quad PF,6+ Mos 01/26/2013, 02/17/2015, 01/28/2017, 02/06/2018   Influenza-Unspecified 03/09/2016, 04/04/2020   PFIZER(Purple Top)SARS-COV-2 Vaccination 06/25/2019, 07/16/2019   Pneumococcal Conjugate-13 10/22/2014   Pneumococcal Polysaccharide-23 09/07/2010, 03/23/2016   Td 12/25/2007   Tdap  12/25/2007, 04/17/2010, 12/11/2019   Zoster Recombinat (Shingrix) 07/23/2017, 10/22/2017   Zoster, Live 03/30/2010      Review of Systems  All other systems reviewed and are negative.      Objective:   Physical Exam Vitals reviewed.  Constitutional:      General: She is not in acute distress.    Appearance: She is well-developed. She is not diaphoretic.  HENT:     Head: Normocephalic and atraumatic.     Right Ear: External ear normal.     Left Ear: External ear normal.     Nose: Nose normal.     Mouth/Throat:     Pharynx: No oropharyngeal exudate.  Eyes:     General: No scleral icterus.       Right eye: No discharge.        Left eye: No discharge.     Conjunctiva/sclera: Conjunctivae normal.     Pupils: Pupils are equal, round, and reactive to light.  Neck:     Thyroid: No thyromegaly.     Vascular: No JVD.     Trachea: No tracheal deviation.  Cardiovascular:     Rate and Rhythm: Normal rate and regular rhythm.     Heart sounds: Normal heart sounds. No murmur heard.    No friction rub. No gallop.  Pulmonary:     Effort: Pulmonary effort is normal. No respiratory distress.     Breath sounds: Normal breath sounds. No stridor. No wheezing or rales.  Chest:     Chest wall: No tenderness.  Abdominal:     General: Bowel sounds are normal. There is no distension.     Palpations: Abdomen is soft.     Tenderness: There is no abdominal tenderness. There is no guarding or rebound.  Musculoskeletal:        General: No tenderness. Normal range of motion.     Cervical back: Normal range of motion and neck supple.  Lymphadenopathy:     Cervical: No cervical adenopathy.  Skin:  General: Skin is warm.     Coloration: Skin is not pale.     Findings: No erythema or rash.  Neurological:     Mental Status: She is alert and oriented to person, place, and time.     Cranial Nerves: No cranial nerve deficit.     Motor: No abnormal muscle tone.     Coordination: Coordination  normal.     Deep Tendon Reflexes: Reflexes normal.  Psychiatric:        Behavior: Behavior normal.        Thought Content: Thought content normal.        Judgment: Judgment normal.           Assessment & Plan:  Benign essential HTN - Plan: CBC with Differential/Platelet, Lipid panel, COMPLETE METABOLIC PANEL WITH GFR  Osteoporosis, post-menopausal  Pure hypercholesterolemia  Encounter for Medicare annual wellness exam  History of TIA (transient ischemic attack)  Von Willebrand disease (HCC)  Personal history of breast cancer Patient's blood pressure today is excellent.  Her physical exam is completely normal.  Mammogram and colonoscopy are up-to-date.  Bone density test is up-to-date.  We discussed taking a break from bisphosphonate therapy however the patient elects to continue the injections for the present time.  We discussed COVID vaccination and RSV vaccination.  The patient politely declines both of these.  She denies any falls or depression or memory loss.  Regular anticipatory guidance is provided.  I will check a CBC, CMP, and a fasting lipid panel.  I like to see her LDL cholesterol less than 161.

## 2022-09-19 LAB — CBC WITH DIFFERENTIAL/PLATELET
Absolute Monocytes: 1220 cells/uL — ABNORMAL HIGH (ref 200–950)
Basophils Absolute: 40 cells/uL (ref 0–200)
Basophils Relative: 0.4 %
Eosinophils Absolute: 100 cells/uL (ref 15–500)
Eosinophils Relative: 1 %
HCT: 42.4 % (ref 35.0–45.0)
Hemoglobin: 14.4 g/dL (ref 11.7–15.5)
Lymphs Abs: 2030 cells/uL (ref 850–3900)
MCH: 31.3 pg (ref 27.0–33.0)
MCHC: 34 g/dL (ref 32.0–36.0)
MCV: 92.2 fL (ref 80.0–100.0)
MPV: 10.6 fL (ref 7.5–12.5)
Monocytes Relative: 12.2 %
Neutro Abs: 6610 cells/uL (ref 1500–7800)
Neutrophils Relative %: 66.1 %
Platelets: 226 10*3/uL (ref 140–400)
RBC: 4.6 10*6/uL (ref 3.80–5.10)
RDW: 12.4 % (ref 11.0–15.0)
Total Lymphocyte: 20.3 %
WBC: 10 10*3/uL (ref 3.8–10.8)

## 2022-09-19 LAB — COMPLETE METABOLIC PANEL WITH GFR
AG Ratio: 1.9 (calc) (ref 1.0–2.5)
ALT: 13 U/L (ref 6–29)
AST: 17 U/L (ref 10–35)
Albumin: 4.1 g/dL (ref 3.6–5.1)
Alkaline phosphatase (APISO): 47 U/L (ref 37–153)
BUN/Creatinine Ratio: 16 (calc) (ref 6–22)
BUN: 16 mg/dL (ref 7–25)
CO2: 31 mmol/L (ref 20–32)
Calcium: 9.5 mg/dL (ref 8.6–10.4)
Chloride: 102 mmol/L (ref 98–110)
Creat: 1.02 mg/dL — ABNORMAL HIGH (ref 0.60–1.00)
Globulin: 2.2 g/dL (calc) (ref 1.9–3.7)
Glucose, Bld: 95 mg/dL (ref 65–99)
Potassium: 4.5 mmol/L (ref 3.5–5.3)
Sodium: 140 mmol/L (ref 135–146)
Total Bilirubin: 1 mg/dL (ref 0.2–1.2)
Total Protein: 6.3 g/dL (ref 6.1–8.1)
eGFR: 58 mL/min/{1.73_m2} — ABNORMAL LOW (ref >=60)

## 2022-09-19 LAB — LIPID PANEL
Cholesterol: 164 mg/dL (ref <200)
HDL: 83 mg/dL (ref >=50)
LDL Cholesterol (Calc): 63 mg/dL (calc)
Non-HDL Cholesterol (Calc): 81 mg/dL (calc) (ref <130)
Total CHOL/HDL Ratio: 2 (calc) (ref <5.0)
Triglycerides: 96 mg/dL (ref <150)

## 2022-09-20 ENCOUNTER — Encounter: Payer: Self-pay | Admitting: Family Medicine

## 2022-10-05 ENCOUNTER — Ambulatory Visit (INDEPENDENT_AMBULATORY_CARE_PROVIDER_SITE_OTHER): Payer: Medicare Other

## 2022-10-05 DIAGNOSIS — M810 Age-related osteoporosis without current pathological fracture: Secondary | ICD-10-CM

## 2022-10-05 MED ORDER — DENOSUMAB 60 MG/ML ~~LOC~~ SOSY
60.0000 mg | PREFILLED_SYRINGE | Freq: Once | SUBCUTANEOUS | Status: AC
Start: 2022-10-05 — End: 2022-10-05
  Administered 2022-10-05: 60 mg via SUBCUTANEOUS

## 2022-10-05 NOTE — Progress Notes (Signed)
Prolia inject given on R-upper arm. Pt tol well, no c/o pt . Pt left amublatory w/vss.

## 2022-10-31 ENCOUNTER — Other Ambulatory Visit: Payer: Self-pay | Admitting: Gastroenterology

## 2022-10-31 DIAGNOSIS — K5904 Chronic idiopathic constipation: Secondary | ICD-10-CM

## 2022-11-13 ENCOUNTER — Encounter: Payer: Self-pay | Admitting: Family Medicine

## 2022-11-14 ENCOUNTER — Other Ambulatory Visit: Payer: Self-pay | Admitting: Family Medicine

## 2022-11-14 DIAGNOSIS — Z0000 Encounter for general adult medical examination without abnormal findings: Secondary | ICD-10-CM

## 2022-11-14 DIAGNOSIS — F4321 Adjustment disorder with depressed mood: Secondary | ICD-10-CM

## 2022-11-19 ENCOUNTER — Other Ambulatory Visit: Payer: Self-pay | Admitting: Family Medicine

## 2022-11-20 NOTE — Telephone Encounter (Signed)
Requested Prescriptions  Pending Prescriptions Disp Refills   montelukast (SINGULAIR) 10 MG tablet [Pharmacy Med Name: MONTELUKAST SODIUM TABS 10MG ] 90 tablet 0    Sig: TAKE 1 TABLET AT BEDTIME     Pulmonology:  Leukotriene Inhibitors Failed - 11/19/2022  3:33 AM      Failed - Valid encounter within last 12 months    Recent Outpatient Visits           1 year ago Pure hypercholesterolemia   [facility] Pickard, Priscille Heidelberg, MD   1 year ago Contusion of right periocular region, initial encounter   [facility] Pickard, Priscille Heidelberg, MD   1 year ago Pure hypercholesterolemia   [facility] Tanya Nones, Priscille Heidelberg, MD   1 year ago Acute bacterial rhinosinusitis   [facility] Donita Brooks, MD   2 years ago History of TIA (transient ischemic attack)   [facility] Pickard, Priscille Heidelberg, MD       Future Appointments             In 6 days Pickard, Priscille Heidelberg, MD [facility]

## 2022-11-26 ENCOUNTER — Ambulatory Visit (INDEPENDENT_AMBULATORY_CARE_PROVIDER_SITE_OTHER): Payer: Medicare Other | Admitting: Family Medicine

## 2022-11-26 ENCOUNTER — Ambulatory Visit (HOSPITAL_COMMUNITY)
Admission: RE | Admit: 2022-11-26 | Discharge: 2022-11-26 | Disposition: A | Discharge status: Home / Self Care | Payer: Medicare Other | Source: Ambulatory Visit | Attending: Family Medicine | Admitting: Family Medicine

## 2022-11-26 ENCOUNTER — Encounter: Payer: Self-pay | Admitting: Family Medicine

## 2022-11-26 VITALS — BP 142/84 | HR 96 | Temp 98.2°F | Ht 65.5 in | Wt 136.4 lb

## 2022-11-26 DIAGNOSIS — R053 Chronic cough: Secondary | ICD-10-CM | POA: Diagnosis not present

## 2022-11-26 DIAGNOSIS — R059 Cough, unspecified: Secondary | ICD-10-CM | POA: Diagnosis not present

## 2022-11-26 MED ORDER — FLUTICASONE PROPIONATE 50 MCG/ACT NA SUSP
2.0000 | Freq: Every day | NASAL | 6 refills | Status: DC
Start: 2022-11-26 — End: 2022-12-24

## 2022-11-26 NOTE — Progress Notes (Signed)
Subjective:    Patient ID: Caroline Valdez, female    DOB: 01-Feb-1951, 72 y.o.   MRN: [id number] Patient is a very pleasant 72 year old Caucasian female who states that she has been coughing almost every day since September.  She denies any fevers.  She denies any chills.  She denies any weight loss.  The cough is nonproductive.  She denies any hemoptysis.  She denies any shortness of breath.  She denies any pleurisy.  She denies any travel or exposure to whooping cough or tuberculosis.  She has no history of smoking or asthma.  She does have severe reflux and takes twice daily proton pump inhibitors as well as Tagamet.  She also has a history of allergies but she is already taking Claritin and Singulair  Past Medical History:  Diagnosis Date   Blood dyscrasia    Breast cancer (HCC)    left   Complication of anesthesia    1980S  TUBAL LIG, + HEMM SURGERY PT PASSED OUT AND WENT INTO SHOCK.  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    Endometriosis    Fibromyalgia    GERD (gastroesophageal reflux disease)    Hypercholesteremia    Mini stroke 09/2012   Radiculopathy    Stroke [facility])    MINI STROKE   Von Willebrand disease (HCC) 09/11/2013   Von Willebrand's disease [facility])    history of   Past Surgical History:  Procedure Laterality Date   ABDOMINAL HYSTERECTOMY     AUGMENTATION MAMMAPLASTY Right    BACK SURGERY     BREAST IMPLANT REMOVAL Right 12/05/2017   BREAST IMPLANT REMOVAL Right 12/05/2017   Procedure: REMOVAL RIGHT  BREAST IMPLANT MATERIAL;  Surgeon: Etter Sjogren, MD;  Location: [facility] OR;  Service: Plastics;  Laterality: Right;   BREAST RECONSTRUCTION     BREAST RECONSTRUCTION Right 12/05/2017   Procedure: DELAYED BREAST RECONSTRUCTION WITH SILICONE IMPLANT;  Surgeon: Etter Sjogren, MD;  Location: [facility] OR;  Service: Plastics;  Laterality: Right;   CARPAL TUNNEL RELEASE     right   COLONOSCOPY  01/2010   Dr. Franky Macho: normal   COLONOSCOPY WITH  PROPOFOL N/A 01/22/2019   Procedure: COLONOSCOPY WITH PROPOFOL;  Surgeon: Corbin Ade, MD;  [facility];  Service: Endoscopy;  Laterality: N/A;  2:30pm - can not come earlier due to transportation   FINGER SURGERY     LEFT THUMB   HEMORRHOID SURGERY     X2   LAMINECTOMY     MASTECTOMY     left   SHOULDER SURGERY     SKIN CANCER EXCISION     TONSILLECTOMY     TUBAL LIGATION     Current Outpatient Medications on File Prior to Visit  Medication Sig Dispense Refill   acetaminophen (TYLENOL) 500 MG tablet Take 1,000 mg by mouth daily as needed for headache.     ALPRAZolam (XANAX) 0.5 MG tablet TAKE 1 TABLET(0.5 MG) BY MOUTH AT BEDTIME AS NEEDED 60 tablet 2   benzonatate (TESSALON) 200 MG capsule Take 1 capsule (200 mg total) by mouth 3 (three) times daily as needed for cough. 30 capsule 0   cholecalciferol (VITAMIN D) 1000 UNITS tablet Take 1,000 Units by mouth daily.     cimetidine (TAGAMET) 400 MG tablet TAKE 1 TABLET TWICE A DAY AS NEEDED 180 tablet 3   HYDROcodone bit-homatropine (HYCODAN) 5-1.5 MG/5ML syrup Take 5 mLs by mouth every 8 (eight) hours as needed for  cough. 120 mL 0   loratadine (CLARITIN) 10 MG tablet Take 10 mg by mouth daily.     montelukast (SINGULAIR) 10 MG tablet TAKE 1 TABLET AT BEDTIME 90 tablet 0   Multiple Vitamin (MULTIVITAMIN WITH MINERALS) TABS tablet Take 1 tablet by mouth daily.     ondansetron (ZOFRAN) 4 MG tablet TAKE 1 TABLET(4 MG) BY MOUTH EVERY 8 HOURS AS NEEDED FOR NAUSEA OR VOMITING 20 tablet 0   pantoprazole (PROTONIX) 40 MG tablet TAKE 1 TABLET TWICE A DAY 180 tablet 3   rosuvastatin (CRESTOR) 20 MG tablet TAKE 1 TABLET DAILY 90 tablet 1   TRULANCE 3 MG TABS TAKE 1 TABLET DAILY 90 tablet 3   zinc sulfate 220 (50 Zn) MG capsule Take 220 mg by mouth daily.      No current facility-administered medications on file prior to visit.   Allergies  Allergen Reactions   Aspirin Other (See Comments)    Pt has Von Willebrands Disease    Hydrocodone Itching   Oxycodone Itching   Tape Dermatitis    Pulls off skin   Social History   Socioeconomic History   Marital status: Widowed    Spouse name: Not on file   Number of children: 1   Years of education: Not on file   Highest education level: 12th grade  Occupational History   Not on file  Tobacco Use   Smoking status: Never   Smokeless tobacco: Never  Vaping Use   Vaping Use: Never used  Substance and Sexual Activity   Alcohol use: No   Drug use: No   Sexual activity: Not on file    Comment: husband recently died from myelodysplastic syndrome in January of 2014  Other Topics Concern   Not on file  Social History Narrative   1 Daughter, April   1 grandson-20, 1 granddaughter-4.   Keeps granddaughter during the week.   Social Determinants of Health   Financial Resource Strain: Low Risk  (11/22/2022)   Overall Financial Resource Strain (CARDIA)    Difficulty of Paying Living Expenses: Not hard at all  Food Insecurity: No Food Insecurity (11/22/2022)   Hunger Vital Sign    Worried About Running Out of Food in the Last Year: Never true    Ran Out of Food in the Last Year: Never true  Transportation Needs: No Transportation Needs (11/22/2022)   PRAPARE - Administrator, Civil Service (Medical): No    Lack of Transportation (Non-Medical): No  Physical Activity: Insufficiently Active (11/22/2022)   Exercise Vital Sign    Days of Exercise per Week: 3 days    Minutes of Exercise per Session: 30 min  Stress: No Stress Concern Present (11/22/2022)   [employer] of Occupational Health - Occupational Stress Questionnaire    Feeling of Stress : Only a little  Social Connections: Unknown (11/22/2022)   Social Connection and Isolation Panel [NHANES]    Frequency of Communication with Friends and Family: Patient declined    Frequency of Social Gatherings with Friends and Family: Patient declined    Attends Religious Services: Patient declined    Loss adjuster, chartered or Organizations: Patient declined    Attends Banker Meetings: Not on file    Marital Status: Widowed  Intimate Partner Violence: Not At Risk (09/08/2021)   Humiliation, Afraid, Rape, and Kick questionnaire    Fear of Current or Ex-Partner: No    Emotionally Abused: No    Physically Abused: No  Sexually Abused: No   Family History  Problem Relation Age of Onset   Breast cancer Mother 72       now 35yo   Stroke Father        Father had no full sisters   Breast cancer Sister 46       now 74yo; had TAH/BSO   Lung cancer Brother        died 56yo; smoker     Review of Systems  All other systems reviewed and are negative.      Objective:   Physical Exam Vitals reviewed.  Constitutional:      General: She is not in acute distress.    Appearance: She is well-developed. She is not diaphoretic.  HENT:     Head: Normocephalic and atraumatic.     Right Ear: Tympanic membrane and ear canal normal.     Left Ear: Tympanic membrane and ear canal normal.     Nose:     Right Turbinates: Not enlarged.     Left Turbinates: Not enlarged.     Right Sinus: No maxillary sinus tenderness or frontal sinus tenderness.     Left Sinus: No maxillary sinus tenderness or frontal sinus tenderness.  Eyes:     Conjunctiva/sclera: Conjunctivae normal.  Cardiovascular:     Rate and Rhythm: Normal rate and regular rhythm.     Heart sounds: Normal heart sounds. No murmur heard.    No friction rub. No gallop.  Pulmonary:     Effort: Pulmonary effort is normal. No respiratory distress.     Breath sounds: Normal breath sounds. No wheezing or rales.  Chest:     Chest wall: No tenderness.           Assessment & Plan:  Chronic cough - Plan: DG Chest 2 View Patient has had a chronic cough for more than 8 months.  I suspect upper airway cough syndrome.  Her physical exam today is normal.  Begin by obtaining a chest x-ray.  I will treat the patient empirically by adding Flonase  2 sprays each nostril daily back to her allergy regimen to try to stop postnasal drip.  Continue Tagamet and pantoprazole twice daily.  However I have asked the patient to elevate the head of the bed 2 inches to stop silent reflux at night which could be irritating her airways.  Also unguinal start the patient on an inhaled corticosteroid.  I gave the patient samples of Breztri.  She can start 2 puffs twice daily for possible cough variant asthma.  Also take tramadol every 8 hours as needed for cough..  Reassess in 2 weeks.  Hopefully we can stop the cycle of coughing week and then wean the patient away from these treatments as I believe majority of the cough is a chronic irritation

## 2022-11-30 ENCOUNTER — Other Ambulatory Visit: Payer: Self-pay | Admitting: Family Medicine

## 2022-11-30 NOTE — Telephone Encounter (Signed)
Requested medications are due for refill today.  unsure  Requested medications are on the active medications list.  no  Last refill. 11/06/2021  Future visit scheduled.   no  Notes to clinic.  Refill/refusal not delegated.    Requested Prescriptions  Pending Prescriptions Disp Refills   traMADol (ULTRAM) 50 MG tablet [Pharmacy Med Name: TRAMADOL 50MG  TABLETS] 30 tablet     Sig: TAKE 1 TABLET(50 MG) BY MOUTH EVERY 8 HOURS AS NEEDED     Not Delegated - Analgesics:  Opioid Agonists Failed - 11/30/2022  7:28 AM      Failed - This refill cannot be delegated      Failed - Urine Drug Screen completed in last 360 days      Failed - Valid encounter within last 3 months    Recent Outpatient Visits           1 year ago Pure hypercholesterolemia   [facility] Pickard, Priscille Heidelberg, MD   1 year ago Contusion of right periocular region, initial encounter   [facility] Pickard, Priscille Heidelberg, MD   1 year ago Pure hypercholesterolemia   [facility] Pickard, Priscille Heidelberg, MD   1 year ago Acute bacterial rhinosinusitis   [facility] Donita Brooks, MD   2 years ago History of TIA (transient ischemic attack)   [facility] Pickard, Priscille Heidelberg, MD

## 2022-12-03 ENCOUNTER — Other Ambulatory Visit: Payer: Self-pay | Admitting: Family Medicine

## 2022-12-03 ENCOUNTER — Encounter: Payer: Self-pay | Admitting: Family Medicine

## 2022-12-03 MED ORDER — TRAMADOL HCL 50 MG PO TABS: 50.0000 mg | ORAL_TABLET | Freq: Four times a day (QID) | ORAL | 0 refills | Status: DC | PRN

## 2022-12-11 ENCOUNTER — Other Ambulatory Visit: Payer: Self-pay | Admitting: Family Medicine

## 2022-12-11 NOTE — Telephone Encounter (Signed)
Prescription Request  12/11/2022  LOV: 11/26/2022  What is the name of the medication or equipment?   fluticasone (FLONASE) 50 MCG/ACT nasal spray   Have you contacted your pharmacy to request a refill? Yes   Which pharmacy would you like this sent to?  Patient notified that their request is being sent to the clinical staff for review and that they should receive a response within 2 business days.   Please advise pharmacist

## 2022-12-12 NOTE — Telephone Encounter (Signed)
Rx request is too soon.  Requested Prescriptions  Pending Prescriptions Disp Refills   fluticasone (FLONASE) 50 MCG/ACT nasal spray 16 g 6    Sig: Place 2 sprays into both nostrils daily.     Ear, Nose, and Throat: Nasal Preparations - Corticosteroids Failed - 12/11/2022  2:58 PM      Failed - Valid encounter within last 12 months    Recent Outpatient Visits           1 year ago Pure hypercholesterolemia   [facility] Pickard, Priscille Heidelberg, MD   1 year ago Contusion of right periocular region, initial encounter   [facility] Pickard, Priscille Heidelberg, MD   1 year ago Pure hypercholesterolemia   [facility] Tanya Nones Priscille Heidelberg, MD   1 year ago Acute bacterial rhinosinusitis   [facility] Donita Brooks, MD   2 years ago History of TIA (transient ischemic attack)   [facility] Pickard, Priscille Heidelberg, MD

## 2022-12-13 ENCOUNTER — Ambulatory Visit (INDEPENDENT_AMBULATORY_CARE_PROVIDER_SITE_OTHER): Payer: Medicare Other | Admitting: Family Medicine

## 2022-12-13 ENCOUNTER — Encounter: Payer: Self-pay | Admitting: Family Medicine

## 2022-12-13 VITALS — BP 132/72 | HR 78 | Temp 98.2°F | Ht 65.5 in | Wt 135.6 lb

## 2022-12-13 DIAGNOSIS — R053 Chronic cough: Secondary | ICD-10-CM

## 2022-12-13 NOTE — Progress Notes (Signed)
Subjective:    Patient ID: Caroline Valdez, female    DOB: [birth year], 72 y.o.   MRN: [id number] 11/26/22 Patient is a very pleasant 72 year old Caucasian female who states that she has been coughing almost every day since September.  She denies any fevers.  She denies any chills.  She denies any weight loss.  The cough is nonproductive.  She denies any hemoptysis.  She denies any shortness of breath.  She denies any pleurisy.  She denies any travel or exposure to whooping cough or tuberculosis.  She has no history of smoking or asthma.  She does have severe reflux and takes twice daily proton pump inhibitors as well as Tagamet.  She also has a history of allergies but she is already taking Claritin and Singulair.  At that time, my plan was: Patient has had a chronic cough for more than 8 months.  I suspect upper airway cough syndrome.  Her physical exam today is normal.  Begin by obtaining a chest x-ray.  I will treat the patient empirically by adding Flonase 2 sprays each nostril daily back to her allergy regimen to try to stop postnasal drip.  Continue Tagamet and pantoprazole twice daily.  However I have asked the patient to elevate the head of the bed 2 inches to stop silent reflux at night which could be irritating her airways.  Also unguinal start the patient on an inhaled corticosteroid.  I gave the patient samples of Breztri.  She can start 2 puffs twice daily for possible cough variant asthma.  Also take tramadol every 8 hours as needed for cough..  Reassess in 2 weeks.  Hopefully we can stop the cycle of coughing week and then wean the patient away from these treatments as I believe majority of the cough is a chronic irritation  12/13/22 Patient states that her cough is no better.  If anything it may be worse.  She continues to have paroxysms of cough that come and go.  Today she is coughing constantly during our encounter.  The cough is dry and nonproductive.  She denies any shortness of breath.   She saw no benefit from [facility].  She saw no benefit from Flonase.  She is on maximum therapy for GERD.  We even tried tramadol as a cough suppressant.  Despite all of these actions, nothing has improved the cough.  Her chest x-ray was clear.  Past Medical History:  Diagnosis Date   Blood dyscrasia    Breast cancer (HCC)    left   Complication of anesthesia    1980S  TUBAL LIG, + HEMM SURGERY PT PASSED OUT AND WENT INTO SHOCK.  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    Endometriosis    Fibromyalgia    GERD (gastroesophageal reflux disease)    Hypercholesteremia    Mini stroke 09/2012   Radiculopathy    Stroke [facility])    MINI STROKE   Von Willebrand disease (HCC) 09/11/2013   Von Willebrand's disease [facility])    history of   Past Surgical History:  Procedure Laterality Date   ABDOMINAL HYSTERECTOMY     AUGMENTATION MAMMAPLASTY Right    BACK SURGERY     BREAST IMPLANT REMOVAL Right 12/05/2017   BREAST IMPLANT REMOVAL Right 12/05/2017   Procedure: REMOVAL RIGHT  BREAST IMPLANT MATERIAL;  Surgeon: Etter Sjogren, MD;  Location: [facility] OR;  Service: Plastics;  Laterality: Right;   BREAST RECONSTRUCTION  BREAST RECONSTRUCTION Right 12/05/2017   Procedure: DELAYED BREAST RECONSTRUCTION WITH SILICONE IMPLANT;  Surgeon: Etter Sjogren, MD;  Location: [facility] OR;  Service: Plastics;  Laterality: Right;   CARPAL TUNNEL RELEASE     right   COLONOSCOPY  01/2010   Dr. Franky Macho: normal   COLONOSCOPY WITH PROPOFOL N/A 01/22/2019   Procedure: COLONOSCOPY WITH PROPOFOL;  Surgeon: Corbin Ade, MD;  [facility];  Service: Endoscopy;  Laterality: N/A;  2:30pm - can not come earlier due to transportation   FINGER SURGERY     LEFT THUMB   HEMORRHOID SURGERY     X2   LAMINECTOMY     MASTECTOMY     left   SHOULDER SURGERY     SKIN CANCER EXCISION     TONSILLECTOMY     TUBAL LIGATION     Current Outpatient Medications on File Prior to Visit  Medication Sig  Dispense Refill   acetaminophen (TYLENOL) 500 MG tablet Take 1,000 mg by mouth daily as needed for headache.     ALPRAZolam (XANAX) 0.5 MG tablet TAKE 1 TABLET(0.5 MG) BY MOUTH AT BEDTIME AS NEEDED 60 tablet 2   benzonatate (TESSALON) 200 MG capsule Take 1 capsule (200 mg total) by mouth 3 (three) times daily as needed for cough. 30 capsule 0   cholecalciferol (VITAMIN D) 1000 UNITS tablet Take 1,000 Units by mouth daily.     cimetidine (TAGAMET) 400 MG tablet TAKE 1 TABLET TWICE A DAY AS NEEDED 180 tablet 3   fluticasone (FLONASE) 50 MCG/ACT nasal spray Place 2 sprays into both nostrils daily. 16 g 6   HYDROcodone bit-homatropine (HYCODAN) 5-1.5 MG/5ML syrup Take 5 mLs by mouth every 8 (eight) hours as needed for cough. 120 mL 0   loratadine (CLARITIN) 10 MG tablet Take 10 mg by mouth daily.     montelukast (SINGULAIR) 10 MG tablet TAKE 1 TABLET AT BEDTIME 90 tablet 0   Multiple Vitamin (MULTIVITAMIN WITH MINERALS) TABS tablet Take 1 tablet by mouth daily.     ondansetron (ZOFRAN) 4 MG tablet TAKE 1 TABLET(4 MG) BY MOUTH EVERY 8 HOURS AS NEEDED FOR NAUSEA OR VOMITING 20 tablet 0   pantoprazole (PROTONIX) 40 MG tablet TAKE 1 TABLET TWICE A DAY 180 tablet 3   rosuvastatin (CRESTOR) 20 MG tablet TAKE 1 TABLET DAILY 90 tablet 1   traMADol (ULTRAM) 50 MG tablet Take 1 tablet (50 mg total) by mouth every 6 (six) hours as needed (cough). 60 tablet 0   TRULANCE 3 MG TABS TAKE 1 TABLET DAILY 90 tablet 3   zinc sulfate 220 (50 Zn) MG capsule Take 220 mg by mouth daily.      No current facility-administered medications on file prior to visit.   Allergies  Allergen Reactions   Aspirin Other (See Comments)    Pt has Von Willebrands Disease   Hydrocodone Itching   Oxycodone Itching   Tape Dermatitis    Pulls off skin   Social History   Socioeconomic History   Marital status: Widowed    Spouse name: Not on file   Number of children: 1   Years of education: Not on file   Highest education level:  12th grade  Occupational History   Not on file  Tobacco Use   Smoking status: Never   Smokeless tobacco: Never  Vaping Use   Vaping status: Never Used  Substance and Sexual Activity   Alcohol use: No   Drug use: No   Sexual  activity: Not on file    Comment: husband recently died from myelodysplastic syndrome in January of 2014  Other Topics Concern   Not on file  Social History Narrative   1 Daughter, April   1 grandson-20, 1 granddaughter-4.   Keeps granddaughter during the week.   Social Determinants of Health   Financial Resource Strain: Low Risk  (11/22/2022)   Overall Financial Resource Strain (CARDIA)    Difficulty of Paying Living Expenses: Not hard at all  Food Insecurity: No Food Insecurity (11/22/2022)   Hunger Vital Sign    Worried About Running Out of Food in the Last Year: Never true    Ran Out of Food in the Last Year: Never true  Transportation Needs: No Transportation Needs (11/22/2022)   PRAPARE - Administrator, Civil Service (Medical): No    Lack of Transportation (Non-Medical): No  Physical Activity: Insufficiently Active (11/22/2022)   Exercise Vital Sign    Days of Exercise per Week: 3 days    Minutes of Exercise per Session: 30 min  Stress: No Stress Concern Present (11/22/2022)   Caroline Valdez    Feeling of Stress : Only a little  Social Connections: Unknown (11/22/2022)   Social Connection and Isolation Panel [NHANES]    Frequency of Communication with Friends and Family: Patient declined    Frequency of Social Gatherings with Friends and Family: Patient declined    Attends Religious Services: Patient declined    Database administrator or Organizations: Patient declined    Attends Banker Meetings: Not on file    Marital Status: Widowed  Intimate Partner Violence: Not At Risk (09/08/2021)   Humiliation, Afraid, Rape, and Kick Valdez    Fear of Current or  Ex-Partner: No    Emotionally Abused: No    Physically Abused: No    Sexually Abused: No   Family History  Problem Relation Age of Onset   Breast cancer Mother 44       now 44yo   Stroke Father        Father had no full sisters   Breast cancer Sister 37       now 49yo; had TAH/BSO   Lung cancer Brother        died 15yo; smoker     Review of Systems  All other systems reviewed and are negative.      Objective:   Physical Exam Vitals reviewed.  Constitutional:      General: She is not in acute distress.    Appearance: She is well-developed. She is not diaphoretic.  HENT:     Head: Normocephalic and atraumatic.     Right Ear: Tympanic membrane and ear canal normal.     Left Ear: Tympanic membrane and ear canal normal.     Nose:     Right Turbinates: Not enlarged.     Left Turbinates: Not enlarged.     Right Sinus: No maxillary sinus tenderness or frontal sinus tenderness.     Left Sinus: No maxillary sinus tenderness or frontal sinus tenderness.  Eyes:     Conjunctiva/sclera: Conjunctivae normal.  Cardiovascular:     Rate and Rhythm: Normal rate and regular rhythm.     Heart sounds: Normal heart sounds. No murmur heard.    No friction rub. No gallop.  Pulmonary:     Effort: Pulmonary effort is normal. No respiratory distress.     Breath sounds: Normal breath sounds.  No wheezing or rales.  Chest:     Chest wall: No tenderness.           Assessment & Plan:  Chronic cough - Plan: Ambulatory referral to Pulmonology Patient has had a chronic cough now for almost 10 months.  We have tried maximum therapy for allergies, acid reflux, even cough variant asthma.  We have also tried the patient on tramadol with no benefit.  Chest x-ray is.  At this point, I am going to consult pulmonology to determine if they can help treat her cough.  I apologized to the patient for lack of success thus far

## 2022-12-24 ENCOUNTER — Telehealth: Payer: Self-pay | Admitting: Family Medicine

## 2022-12-24 MED ORDER — FLUTICASONE PROPIONATE 50 MCG/ACT NA SUSP: 2.0000 | Freq: Every day | NASAL | 6 refills | Status: DC

## 2022-12-24 NOTE — Telephone Encounter (Signed)
Prescription Request  12/24/2022  LOV: 12/13/2022  What is the name of the medication or equipment?   fluticasone (FLONASE) 50 MCG/ACT nasal spray [[id number]]  **90 day supply requested**  Have you contacted your pharmacy to request a refill? Yes   Which pharmacy would you like this sent to?    [facility] - [place], [place] - [address] [place] [address] Phone: [phone] Fax: [phone]   Patient notified that their request is being sent to the clinical staff for review and that they should receive a response within 2 business days.   Please advise pharmacist.

## 2022-12-27 NOTE — Progress Notes (Signed)
Caroline Valdez, female    DOB: [birth year]    MRN: [id number]   Brief patient profile:  47  yowf  never smoker with h/o severe gerd  referred to pulmonary clinic in [place]  12/28/2022 by Dr Tanya Nones  for onset  of cough fall of 2023 (? Covid related)  in setting of new onset rhinitis spring > fall which started  well  prior to covid  rx singulair flonase clariton no better even on  prednisone / no prev  eval by allergist   Has h/o GERD induced choking requiring EGD/ dilation though nothing in EPIC to this effect.   History of Present Illness  12/28/2022  Pulmonary/ 1st office eval/ Caroline Valdez / [place] [facility]  Chief Complaint  Patient presents with   Establish Care   Cough  Dyspnea:  chases 5 year olds around / up and down steps, housework s limiting doe Cough: esp brought on by speaking  but not fumes/  flares  p hs maybe twice a week not in early in am's and starts  30 min to an hour p stirring in am> mostly dry / using lots of cough drops with menthol "only thing that helps"  Sleep: on side/ chokes on back / wood under the headboard only 2 in though SABA use: none  02: no  No obvious other day to day or daytime pattern  or assoc excess/ purulent sputum or mucus plugs or hemoptysis or cp or chest tightness, subjective wheeze or overt sinus or hb symptoms.    Also denies any obvious fluctuation of symptoms with weather or environmental changes or other aggravating or alleviating factors except as outlined above   No unusual exposure hx or h/o childhood pna/ asthma or knowledge of premature birth.  Current Allergies, Complete Past Medical History, Past Surgical History, Family History, and Social History were reviewed in [employer] record.  ROS  The following are not active complaints unless bolded Hoarseness, sore throat, dysphagia/globus sensation, dental problems, itching, sneezing,  nasal congestion or discharge of excess mucus or purulent secretions, ear  ache,   fever, chills, sweats, unintended wt loss or wt gain, classically pleuritic or exertional cp,  orthopnea pnd or arm/hand swelling  or leg swelling, presyncope, palpitations, abdominal pain, anorexia, nausea, vomiting, diarrhea  or change in bowel habits or change in bladder habits, change in stools or change in urine, dysuria, hematuria,  rash, arthralgias, visual complaints, headache, numbness, weakness or ataxia or problems with walking or coordination,  change in mood or  memory.              Outpatient Medications Prior to Visit  Medication Sig Dispense Refill   acetaminophen (TYLENOL) 500 MG tablet Take 1,000 mg by mouth daily as needed for headache.     ALPRAZolam (XANAX) 0.5 MG tablet TAKE 1 TABLET(0.5 MG) BY MOUTH AT BEDTIME AS NEEDED 60 tablet 2   cholecalciferol (VITAMIN D) 1000 UNITS tablet Take 1,000 Units by mouth daily.     cimetidine (TAGAMET) 400 MG tablet TAKE 1 TABLET TWICE A DAY AS NEEDED 180 tablet 3   fluticasone (FLONASE) 50 MCG/ACT nasal spray Place 2 sprays into both nostrils daily. 16 g 6   loratadine (CLARITIN) 10 MG tablet Take 10 mg by mouth daily.     montelukast (SINGULAIR) 10 MG tablet TAKE 1 TABLET AT BEDTIME 90 tablet 0   Multiple Vitamin (MULTIVITAMIN WITH MINERALS) TABS tablet Take 1 tablet by mouth daily.  ondansetron (ZOFRAN) 4 MG tablet TAKE 1 TABLET(4 MG) BY MOUTH EVERY 8 HOURS AS NEEDED FOR NAUSEA OR VOMITING 20 tablet 0   pantoprazole (PROTONIX) 40 MG tablet TAKE 1 TABLET TWICE A DAY 180 tablet 3   rosuvastatin (CRESTOR) 20 MG tablet TAKE 1 TABLET DAILY 90 tablet 1   traMADol (ULTRAM) 50 MG tablet Take 1 tablet (50 mg total) by mouth every 6 (six) hours as needed (cough). 60 tablet 0   TRULANCE 3 MG TABS TAKE 1 TABLET DAILY 90 tablet 3   zinc sulfate 220 (50 Zn) MG capsule Take 220 mg by mouth daily.      benzonatate (TESSALON) 200 MG capsule Take 1 capsule (200 mg total) by mouth 3 (three) times daily as needed for cough. 30 capsule 0    HYDROcodone bit-homatropine (HYCODAN) 5-1.5 MG/5ML syrup Take 5 mLs by mouth every 8 (eight) hours as needed for cough. 120 mL 0   No facility-administered medications prior to visit.    Past Medical History:  Diagnosis Date   Blood dyscrasia    Breast cancer (HCC)    left   Complication of anesthesia    1980S  TUBAL LIG, + HEMM SURGERY PT PASSED OUT AND WENT INTO SHOCK.  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    Endometriosis    Fibromyalgia    GERD (gastroesophageal reflux disease)    Hypercholesteremia    Mini stroke 09/2012   Radiculopathy    Stroke [facility])    MINI STROKE   Von Willebrand disease (HCC) 09/11/2013   Von Willebrand's disease [facility])    history of      Objective:     BP 112/70   Pulse 92   Ht 5' 5.5" (1.664 m)   Wt 134 lb (60.8 kg)   SpO2 95%   BMI 21.96 kg/m   SpO2: 95 % RA amb pleasant wf nad / freq throat clearing noted   HEENT : Oropharynx  clear, no excess mucus     Nasal turbinates nl    NECK :  without  apparent JVD/ palpable Nodes/TM    LUNGS: no acc muscle use,  Nl contour chest which is clear to A and P bilaterally with mild urge to cough at end exp maneuvers but this did not actually trigger the cough    CV:  RRR  no s3 or murmur or increase in P2, and no edema   ABD:  soft and nontender with nl inspiratory excursion in the supine position. No bruits or organomegaly appreciated   MS:  Nl gait/ ext warm without deformities Or obvious joint restrictions  calf tenderness, cyanosis or clubbing    SKIN: warm and dry without lesions    NEURO:  alert, approp, nl sensorium with  no motor or cerebellar deficits apparent.         I personally reviewed images and agree with radiology impression as follows:  CXR:   pa and lateral 11/26/22 Wnl  Assessment   Upper airway cough syndrome Onset fall 2023 in setting of seasonal rhinitis x ? 10 y - Allergy screen 12/28/2022 >  Eos 0. /  IgE pending   - cyclical cough  rx 12/28/2022 >>>  Upper airway cough syndrome (previously labeled PNDS),  is so named because it's frequently impossible to sort out how much is  CR/sinusitis with freq throat clearing (which can be related to primary GERD)   vs  causing  secondary (" extra  esophageal")  GERD from wide swings in gastric pressure that occur with throat clearing, often  promoting self use of mint and menthol lozenges that reduce the lower esophageal sphincter tone and exacerbate the problem further in a cyclical fashion.   These are the same pts (now being labeled as having "irritable larynx syndrome" by some cough centers) who not infrequently have a history of having failed to tolerate ace inhibitors,  dry powder inhalers or biphosphonates or report having atypical/extraesophageal reflux symptoms that don't respond to standard doses of PPI  and are easily confused as having aecopd or asthma flares by even experienced allergists/ pulmonologists (myself included).   Of the three most common causes of  Sub-acute / recurrent or chronic cough, only one (GERD)  can actually contribute to/ trigger  the other two (asthma and post nasal drip syndrome)  and perpetuate the cylce of cough.  While not intuitively obvious, many patients with chronic low grade reflux do not cough until there is a primary insult that disturbs the protective epithelial barrier and exposes sensitive nerve endings.   This is typically viral but can due to PNDS and  either may apply here.    >>> The point is that once this occurs, it is difficult to eliminate the cycle  using anything but a maximally effective acid suppression regimen at least in the short run, accompanied by an appropriate diet to address non acid GERD and eliminate pnds with 1st gen H1 blockers per guidelines  plu control the cough itself for at least 3 days with hycodan cough syrup then taper off completely plus depomedrol 120 mg IM   in case of component of Th-2 driven upper or lower  airways inflammation (if cough responds short term only to relapse before return while will on full rx for uacs (as above), then  that would point to allergic rhinitis/ asthma or eos bronchitis as alternative dx)   Advised  The standardized cough guidelines published in Chest by [place] in 2006 are still the best available and consist of a multiple step process (up to 12!) , not a single office visit,  and are intended  to address this problem logically,  with an alogrithm dependent on response to empiric treatment at  each progressive step  to determine a specific diagnosis with  minimal addtional testing needed. Therefore if adherence is an issue or can't be accurately verified,  it's very unlikely the standard evaluation and treatment will be successful here.    Furthermore, response to therapy (other than acute cough suppression, which should only be used short term with avoidance of narcotic containing cough syrups if possible), can be a gradual process for which the patient is not likely to  perceive immediate benefit.  Unlike going to an eye doctor where the best perscription is almost always the first one and is immediately effective, this is almost never the case in the management of chronic cough syndromes. Therefore the patient needs to commit up front to consistently adhere to recommendations  for up to 4-6 weeks of therapy directed at the likely underlying problem(s) before the response can be reasonably evaluated.   Rec f/u ov 4 weeks with all meds in hand using a trust but verify approach to confirm accurate Medication  Reconciliation The principal here is that until we are certain that the  patients are doing what we've asked, it makes no sense to ask them to do more.  Each maintenance medication was reviewed in detail including emphasizing most importantly the difference between maintenance and prns and under what circumstances the prns are to be triggered using an action  plan format where appropriate.  Total time for H and P, chart review, counseling,  and generating customized AVS unique to this office visit / same day charting > 60 min for v refractory respiratory  symptoms of uncertain etiology in pt new to me               Sandrea Hughs, MD 12/28/2022

## 2022-12-28 ENCOUNTER — Ambulatory Visit: Payer: Medicare Other | Admitting: Internal Medicine

## 2022-12-28 ENCOUNTER — Encounter: Payer: Self-pay | Admitting: Internal Medicine

## 2022-12-28 VITALS — BP 112/70 | HR 92 | Ht 65.5 in | Wt 134.0 lb

## 2022-12-28 DIAGNOSIS — R058 Other specified cough: Secondary | ICD-10-CM | POA: Insufficient documentation

## 2022-12-28 MED ORDER — FAMOTIDINE 20 MG PO TABS: ORAL_TABLET | ORAL | 11 refills | Status: DC

## 2022-12-28 MED ORDER — HYDROCODONE BIT-HOMATROP MBR 5-1.5 MG/5ML PO SOLN: 5.0000 mL | ORAL | 0 refills | Status: DC | PRN

## 2022-12-28 MED ORDER — METHYLPREDNISOLONE ACETATE 80 MG/ML IJ SUSP
120.0000 mg | Freq: Once | INTRAMUSCULAR | Status: AC
Start: 2022-12-28 — End: 2022-12-28
  Administered 2022-12-28: 120 mg via INTRAMUSCULAR

## 2022-12-28 NOTE — Patient Instructions (Addendum)
Pantoprazole 40  mg continue to  Take 30- 60 min before your first and last meals of the day   Stop tagamet and take Pepcid 20 mg about an hour before bed and stop clariton and try  For drainage / throat tickle try take CHLORPHENIRAMINE  4 mg  ("Allergy Relief" 4mg   at [facility] should be easiest to find in the blue box usually on bottom shelf)  take one every 4 hours as needed - extremely effective and inexpensive over the counter- may cause drowsiness so start with just a dose or two an hour before bedtime and see how you tolerate it before trying in daytime.   Depomedrol 120 mg IM   Try again hydrocodan cough syrup every 4 hours until no cough x 3 days then off.  GERD (REFLUX)  is an extremely common cause of respiratory symptoms just like yours , many times with no obvious heartburn at all.    It can be treated with medication, but also with lifestyle changes including elevation of the head of your bed (ideally with 6 -8inch blocks under the headboard of your bed),  Smoking cessation, avoidance of late meals, excessive alcohol, and avoid fatty foods, chocolate, peppermint, colas, red wine, and acidic juices such as orange juice.  NO MINT OR MENTHOL PRODUCTS SO NO COUGH DROPS except use LUDENS   USE SUGARLESS CANDY INSTEAD (Jolley ranchers or [employer] or Environmental manager) or even ice chips will also do - the key is to swallow to prevent all throat clearing. NO OIL BASED VITAMINS - use powdered substitutes.  Avoid fish oil when coughing.   Please remember to go to the lab department   for your tests - we will call you with the results when they are available.      Please schedule a follow up office visit in 4 weeks, sooner if needed  with all medications /inhalers/ solutions in hand so we can verify exactly what you are taking. This includes all medications from all doctors and over the counters

## 2022-12-29 NOTE — Assessment & Plan Note (Signed)
Onset fall 2023 in setting of seasonal rhinitis x ? 10 y - Allergy screen 12/28/2022 >  Eos 0. /  IgE pending   - cyclical cough rx 12/28/2022 >>>  Upper airway cough syndrome (previously labeled PNDS),  is so named because it's frequently impossible to sort out how much is  CR/sinusitis with freq throat clearing (which can be related to primary GERD)   vs  causing  secondary (" extra esophageal")  GERD from wide swings in gastric pressure that occur with throat clearing, often  promoting self use of mint and menthol lozenges that reduce the lower esophageal sphincter tone and exacerbate the problem further in a cyclical fashion.   These are the same pts (now being labeled as having "irritable larynx syndrome" by some cough centers) who not infrequently have a history of having failed to tolerate ace inhibitors,  dry powder inhalers or biphosphonates or report having atypical/extraesophageal reflux symptoms that don't respond to standard doses of PPI  and are easily confused as having aecopd or asthma flares by even experienced allergists/ pulmonologists (myself included).   Of the three most common causes of  Sub-acute / recurrent or chronic cough, only one (GERD)  can actually contribute to/ trigger  the other two (asthma and post nasal drip syndrome)  and perpetuate the cylce of cough.  While not intuitively obvious, many patients with chronic low grade reflux do not cough until there is a primary insult that disturbs the protective epithelial barrier and exposes sensitive nerve endings.   This is typically viral but can due to PNDS and  either may apply here.    >>> The point is that once this occurs, it is difficult to eliminate the cycle  using anything but a maximally effective acid suppression regimen at least in the short run, accompanied by an appropriate diet to address non acid GERD and eliminate pnds with 1st gen H1 blockers per guidelines  plu control the cough itself for at least 3 days with  hycodan cough syrup then taper off completely plus depomedrol 120 mg IM   in case of component of Th-2 driven upper or lower airways inflammation (if cough responds short term only to relapse before return while will on full rx for uacs (as above), then  that would point to allergic rhinitis/ asthma or eos bronchitis as alternative dx)   Advised  The standardized cough guidelines published in Chest by [place] in 2006 are still the best available and consist of a multiple step process (up to 12!) , not a single office visit,  and are intended  to address this problem logically,  with an alogrithm dependent on response to empiric treatment at  each progressive step  to determine a specific diagnosis with  minimal addtional testing needed. Therefore if adherence is an issue or can't be accurately verified,  it's very unlikely the standard evaluation and treatment will be successful here.    Furthermore, response to therapy (other than acute cough suppression, which should only be used short term with avoidance of narcotic containing cough syrups if possible), can be a gradual process for which the patient is not likely to  perceive immediate benefit.  Unlike going to an eye doctor where the best perscription is almost always the first one and is immediately effective, this is almost never the case in the management of chronic cough syndromes. Therefore the patient needs to commit up front to consistently adhere to recommendations  for up to 4-6 weeks  of therapy directed at the likely underlying problem(s) before the response can be reasonably evaluated.   Rec f/u ov 4 weeks with all meds in hand using a trust but verify approach to confirm accurate Medication  Reconciliation The principal here is that until we are certain that the  patients are doing what we've asked, it makes no sense to ask them to do more.          Each maintenance medication was reviewed in detail including emphasizing most  importantly the difference between maintenance and prns and under what circumstances the prns are to be triggered using an action plan format where appropriate.  Total time for H and P, chart review, counseling,  and generating customized AVS unique to this office visit / same day charting > 60 min for v refractory respiratory  symptoms of uncertain etiology in pt new to me

## 2023-01-02 NOTE — Progress Notes (Deleted)
GI Office Note    Referring Provider: Donita Brooks, MD Primary Care Physician:  Donita Brooks, MD Primary Gastroenterologist: Gerrit Friends.Rourk, MD   Date:  01/02/2023  ID:  Caroline Valdez, DOB 1950/10/23, MRN [id number]   Chief Complaint   No chief complaint on file.  History of Present Illness  Caroline Valdez is a 72 y.o. female with a history of stroke, hypercholesterolemia, GERD, fibromyalgia, von Willebrand disease, endometriosis, and breast cancer presenting today for follow-up.  Colonoscopy September 2020 with diverticulosis in the sigmoid and descending colon, redundant colon, otherwise normal.  No repeat advised.   Last office visit 07/19/2022.  Patient reported her constipation was doing well on Trulance.  Having daily soft bowel movements without any abdominal pain.  Had reported cramping with Amitiza and stated this medication did not work well for her.  Was still having cough even after she stopped Linzess therefore she was going to see an allergist regarding her cough.  Linzess had worked well for her in the past other than the cough.  Doing okay in regards to reflux.  Takes Tums as needed for breakthrough symptoms.  On PPI and cementing twice daily with good appetite.  Denies any nausea or vomiting or dysphagia.  Having some issues intermittently with external hemorrhoids but uses Preparation H suppositories and not having any rectal bleeding.  She is advised to continue PPI and famotidine, GERD diet, and continuing Trulance and Preparation H as needed.  Today: Constipation -   Hemorrhoids -   GERD -    Current Outpatient Medications  Medication Sig Dispense Refill   acetaminophen (TYLENOL) 500 MG tablet Take 1,000 mg by mouth daily as needed for headache.     ALPRAZolam (XANAX) 0.5 MG tablet TAKE 1 TABLET(0.5 MG) BY MOUTH AT BEDTIME AS NEEDED 60 tablet 2   cholecalciferol (VITAMIN D) 1000 UNITS tablet Take 1,000 Units by mouth daily.     famotidine  (PEPCID) 20 MG tablet One at least an hour before bedtime 30 tablet 11   fluticasone (FLONASE) 50 MCG/ACT nasal spray Place 2 sprays into both nostrils daily. 16 g 6   HYDROcodone bit-homatropine (HYCODAN) 5-1.5 MG/5ML syrup Take 5 mLs by mouth every 4 (four) hours as needed for cough. 473 mL 0   montelukast (SINGULAIR) 10 MG tablet TAKE 1 TABLET AT BEDTIME 90 tablet 0   Multiple Vitamin (MULTIVITAMIN WITH MINERALS) TABS tablet Take 1 tablet by mouth daily.     ondansetron (ZOFRAN) 4 MG tablet TAKE 1 TABLET(4 MG) BY MOUTH EVERY 8 HOURS AS NEEDED FOR NAUSEA OR VOMITING 20 tablet 0   pantoprazole (PROTONIX) 40 MG tablet TAKE 1 TABLET TWICE A DAY 180 tablet 3   rosuvastatin (CRESTOR) 20 MG tablet TAKE 1 TABLET DAILY 90 tablet 1   traMADol (ULTRAM) 50 MG tablet Take 1 tablet (50 mg total) by mouth every 6 (six) hours as needed (cough). 60 tablet 0   TRULANCE 3 MG TABS TAKE 1 TABLET DAILY 90 tablet 3   zinc sulfate 220 (50 Zn) MG capsule Take 220 mg by mouth daily.      No current facility-administered medications for this visit.    Past Medical History:  Diagnosis Date   Blood dyscrasia    Breast cancer (HCC)    left   Complication of anesthesia    1980S  TUBAL LIG, + HEMM SURGERY PT PASSED OUT AND WENT INTO SHOCK.  NO PROBLEMS SINCE    Defective Cl/HCO3 exchange in ileum  and colon    Endometriosis    Fibromyalgia    GERD (gastroesophageal reflux disease)    Hypercholesteremia    Mini stroke 09/2012   Radiculopathy    Stroke [facility])    MINI STROKE   Von Willebrand disease (HCC) 09/11/2013   Von Willebrand's disease [facility])    history of    Past Surgical History:  Procedure Laterality Date   ABDOMINAL HYSTERECTOMY     AUGMENTATION MAMMAPLASTY Right    BACK SURGERY     BREAST IMPLANT REMOVAL Right 12/05/2017   BREAST IMPLANT REMOVAL Right 12/05/2017   Procedure: REMOVAL RIGHT  BREAST IMPLANT MATERIAL;  Surgeon: Etter Sjogren, MD;  Location: [facility] OR;  Service: Plastics;  Laterality: Right;    BREAST RECONSTRUCTION     BREAST RECONSTRUCTION Right 12/05/2017   Procedure: DELAYED BREAST RECONSTRUCTION WITH SILICONE IMPLANT;  Surgeon: Etter Sjogren, MD;  Location: [facility] OR;  Service: Plastics;  Laterality: Right;   CARPAL TUNNEL RELEASE     right   COLONOSCOPY  01/2010   Dr. Franky Macho: normal   COLONOSCOPY WITH PROPOFOL N/A 01/22/2019   Procedure: COLONOSCOPY WITH PROPOFOL;  Surgeon: Corbin Ade, MD;  [facility];  Service: Endoscopy;  Laterality: N/A;  2:30pm - can not come earlier due to transportation   FINGER SURGERY     LEFT THUMB   HEMORRHOID SURGERY     X2   LAMINECTOMY     MASTECTOMY     left   SHOULDER SURGERY     SKIN CANCER EXCISION     TONSILLECTOMY     TUBAL LIGATION      Family History  Problem Relation Age of Onset   Breast cancer Mother 87       now 58yo   Stroke Father        Father had no full sisters   Breast cancer Sister 40       now 68yo; had TAH/BSO   Lung cancer Brother        died 44yo; smoker    Allergies as of 01/03/2023 - Review Complete 12/28/2022  Allergen Reaction Noted   Aspirin Other (See Comments) 02/15/2011   Hydrocodone Itching 03/31/2018   Oxycodone Itching 03/31/2018   Tape Dermatitis 06/21/2015    Social History   Socioeconomic History   Marital status: Widowed    Spouse name: Not on file   Number of children: 1   Years of education: Not on file   Highest education level: 12th grade  Occupational History   Not on file  Tobacco Use   Smoking status: Never   Smokeless tobacco: Never  Vaping Use   Vaping status: Never Used  Substance and Sexual Activity   Alcohol use: No   Drug use: No   Sexual activity: Not on file    Comment: husband recently died from myelodysplastic syndrome in January of 2014  Other Topics Concern   Not on file  Social History Narrative   1 Daughter, April   1 grandson-20, 1 granddaughter-4.   Keeps granddaughter during the week.   Social Determinants of Health    Financial Resource Strain: Low Risk  (11/22/2022)   Overall Financial Resource Strain (CARDIA)    Difficulty of Paying Living Expenses: Not hard at all  Food Insecurity: No Food Insecurity (11/22/2022)   Hunger Vital Sign    Worried About Running Out of Food in the Last Year: Never true    Ran Out of Food in the Last Year:  Never true  Transportation Needs: No Transportation Needs (11/22/2022)   PRAPARE - Administrator, Civil Service (Medical): No    Lack of Transportation (Non-Medical): No  Physical Activity: Insufficiently Active (11/22/2022)   Exercise Vital Sign    Days of Exercise per Week: 3 days    Minutes of Exercise per Session: 30 min  Stress: No Stress Concern Present (11/22/2022)   [employer] of Occupational Health - Occupational Stress Questionnaire    Feeling of Stress : Only a little  Social Connections: Unknown (11/22/2022)   Social Connection and Isolation Panel [NHANES]    Frequency of Communication with Friends and Family: Patient declined    Frequency of Social Gatherings with Friends and Family: Patient declined    Attends Religious Services: Patient declined    Database administrator or Organizations: Patient declined    Attends Banker Meetings: Not on file    Marital Status: Widowed     Review of Systems   Gen: Denies fever, chills, anorexia. Denies fatigue, weakness, weight loss.  CV: Denies chest pain, palpitations, syncope, peripheral edema, and claudication. Resp: Denies dyspnea at rest, cough, wheezing, coughing up blood, and pleurisy. GI: See HPI Derm: Denies rash, itching, dry skin Psych: Denies depression, anxiety, memory loss, confusion. No homicidal or suicidal ideation.  Heme: Denies bruising, bleeding, and enlarged lymph nodes.   Physical Exam   There were no vitals taken for this visit.  General:   Alert and oriented. No distress noted. Pleasant and cooperative.  Head:  Normocephalic and atraumatic. Eyes:   Conjuctiva clear without scleral icterus. Mouth:  Oral mucosa pink and moist. Good dentition. No lesions. Lungs:  Clear to auscultation bilaterally. No wheezes, rales, or rhonchi. No distress.  Heart:  S1, S2 present without murmurs appreciated.  Abdomen:  +BS, soft, non-tender and non-distended. No rebound or guarding. No HSM or masses noted. Rectal: *** Msk:  Symmetrical without gross deformities. Normal posture. Extremities:  Without edema. Neurologic:  Alert and  oriented x4 Psych:  Alert and cooperative. Normal mood and affect.   Assessment  Caroline Valdez is a 72 y.o. female with a history of stroke, hypercholesterolemia, GERD, fibromyalgia, von Willebrand disease, endometriosis, and breast cancer presenting today for follow-up.   GERD:  Constipation:  Hemorrhoids:  PLAN   ***     Brooke Bonito, MSN, FNP-BC, AGACNP-BC [facility]

## 2023-01-03 ENCOUNTER — Ambulatory Visit: Payer: Medicare Other | Admitting: Gastroenterology

## 2023-01-03 ENCOUNTER — Telehealth: Payer: Self-pay | Admitting: Gastroenterology

## 2023-01-03 NOTE — Telephone Encounter (Signed)
Patient left a message saying that she had an appointment this morning for a virtual visit but never heard from our office.  She wasn't scheduled as a virtual visit.  Should she be rescheduled and put as a virtual?

## 2023-01-07 ENCOUNTER — Encounter: Payer: Self-pay | Admitting: Gastroenterology

## 2023-01-07 ENCOUNTER — Telehealth (INDEPENDENT_AMBULATORY_CARE_PROVIDER_SITE_OTHER): Payer: Medicare Other | Admitting: Gastroenterology

## 2023-01-07 VITALS — Ht 65.0 in | Wt 134.0 lb

## 2023-01-07 DIAGNOSIS — K5904 Chronic idiopathic constipation: Secondary | ICD-10-CM

## 2023-01-07 DIAGNOSIS — K649 Unspecified hemorrhoids: Secondary | ICD-10-CM

## 2023-01-07 DIAGNOSIS — K219 Gastro-esophageal reflux disease without esophagitis: Secondary | ICD-10-CM

## 2023-01-07 NOTE — Progress Notes (Signed)
Primary Care Physician:  Donita Brooks, MD  Primary Gastroenterologist:  Gerrit Friends. Rourk, MD  Patient Location: Home Reason for Visit: follow up   Persons present on the virtual encounter, with roles: Patient - Caroline Valdez; Provider - Brooke Bonito, NP   Total time (minutes) spent on medical discussion:  0 minutes  Virtual Visit Encounter Note Visit is conducted virtually and was requested by patient.   I connected with Caroline Valdez on 01/07/23 at  3:30 PM EDT by telephone. We were unable to connect via video visit as multiple links not effective.   Chief Complaint  Patient presents with   Follow-up    Follow up on constipation. Pt states her constipation is better   History of Present Illness: Caroline Valdez is a 72 y.o. female with a history of von Willebrand disease, stroke, hypercholesteremia, GERD, fibromyalgia, endometriosis, breast cancer presenting today for follow-up.   Colonoscopy September 2020 with diverticulosis in the sigmoid and descending colon, redundant colon, otherwise normal.  No repeat advised.   Virtual visit 04/16/2022.  Taking pantoprazole and famotidine, still having reflux symptoms occasionally but has not had to use Tums in 1 month.  Has began having a cough at the beginning Linzess therefore she stopped this.  Notes that she has too much fiber she gets constipated.  Proximal staying hydrated.  Denies any straining.  Hemorrhoid improves with having better bowel movements.  Denies any frequent rectal bleeding.  Using Preparation H suppository about 2 weeks prior to visit due to itching and burning.  Advised to start Amitiza 8 mcg twice daily.  Linzess.  Encouraged adequate hydration.  May use over-the-counter hemorrhoid suppositories as needed.  Continue PPI and cimetidine twice daily.  Advised to follow-up in 3-4 months, sooner if needed and to notify if unable to obtain Amitiza.   Patient reports stomach cramping with Amitiza she is only  taking 1/day until it was not working as well as Linzess did.  Had flare of hemorrhoids during this process.  She was advised that she may take 2 tablets in the morning if that is coming up this to have better bowel movements or consider trial of Trulance.  Also noted nausea with Amitiza.  Over-the-counter hemorrhoid cream helping with hemorrhoids.  Patient ultimately requested to trial Trulance.  Samples were left at the desk for her.  After almost 1 week she requested prescription.   Last office visit 07/19/22.  Constipation doing well on Trulance.  Having daily soft bowel movements without any abdominal pain.  Reflux doing okay.  Sometimes has bad days of symptoms and will need Tums which helps provide relief.  Have been continued to take her PPI and cimetidine twice daily.  Hemorrhoids stable.  Using Preparation H cream and suppositories as needed.  Denied any rectal bleeding.  Advised to continue current regimen.  No medication changes made.  Today:    Medications Current Meds  Medication Sig   acetaminophen (TYLENOL) 500 MG tablet Take 1,000 mg by mouth daily as needed for headache.   ALPRAZolam (XANAX) 0.5 MG tablet TAKE 1 TABLET(0.5 MG) BY MOUTH AT BEDTIME AS NEEDED   cholecalciferol (VITAMIN D) 1000 UNITS tablet Take 1,000 Units by mouth daily.   famotidine (PEPCID) 20 MG tablet One at least an hour before bedtime   fluticasone (FLONASE) 50 MCG/ACT nasal spray Place 2 sprays into both nostrils daily.   HYDROcodone bit-homatropine (HYCODAN) 5-1.5 MG/5ML syrup Take 5 mLs by mouth every 4 (four) hours  as needed for cough.   montelukast (SINGULAIR) 10 MG tablet TAKE 1 TABLET AT BEDTIME   Multiple Vitamin (MULTIVITAMIN WITH MINERALS) TABS tablet Take 1 tablet by mouth daily.   ondansetron (ZOFRAN) 4 MG tablet TAKE 1 TABLET(4 MG) BY MOUTH EVERY 8 HOURS AS NEEDED FOR NAUSEA OR VOMITING   pantoprazole (PROTONIX) 40 MG tablet TAKE 1 TABLET TWICE A DAY   rosuvastatin (CRESTOR) 20 MG tablet TAKE 1  TABLET DAILY   TRULANCE 3 MG TABS TAKE 1 TABLET DAILY     History Past Medical History:  Diagnosis Date   Blood dyscrasia    Breast cancer (HCC)    left   Complication of anesthesia    1980S  TUBAL LIG, + HEMM SURGERY PT PASSED OUT AND WENT INTO SHOCK.  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    Endometriosis    Fibromyalgia    GERD (gastroesophageal reflux disease)    Hypercholesteremia    Mini stroke 09/2012   Radiculopathy    Stroke [facility])    MINI STROKE   Von Willebrand disease (HCC) 09/11/2013   Von Willebrand's disease [facility])    history of    Past Surgical History:  Procedure Laterality Date   ABDOMINAL HYSTERECTOMY     AUGMENTATION MAMMAPLASTY Right    BACK SURGERY     BREAST IMPLANT REMOVAL Right 12/05/2017   BREAST IMPLANT REMOVAL Right 12/05/2017   Procedure: REMOVAL RIGHT  BREAST IMPLANT MATERIAL;  Surgeon: Etter Sjogren, MD;  Location: [facility] OR;  Service: Plastics;  Laterality: Right;   BREAST RECONSTRUCTION     BREAST RECONSTRUCTION Right 12/05/2017   Procedure: DELAYED BREAST RECONSTRUCTION WITH SILICONE IMPLANT;  Surgeon: Etter Sjogren, MD;  Location: [facility] OR;  Service: Plastics;  Laterality: Right;   CARPAL TUNNEL RELEASE     right   COLONOSCOPY  01/2010   Dr. Franky Macho: normal   COLONOSCOPY WITH PROPOFOL N/A 01/22/2019   Procedure: COLONOSCOPY WITH PROPOFOL;  Surgeon: Corbin Ade, MD;  [facility];  Service: Endoscopy;  Laterality: N/A;  2:30pm - can not come earlier due to transportation   FINGER SURGERY     LEFT THUMB   HEMORRHOID SURGERY     X2   LAMINECTOMY     MASTECTOMY     left   SHOULDER SURGERY     SKIN CANCER EXCISION     TONSILLECTOMY     TUBAL LIGATION      Family History  Problem Relation Age of Onset   Breast cancer Mother 42       now 73yo   Stroke Father        Father had no full sisters   Breast cancer Sister 61       now 46yo; had TAH/BSO   Lung cancer Brother        died 28yo; smoker     Social History   Socioeconomic History   Marital status: Widowed    Spouse name: Not on file   Number of children: 1   Years of education: Not on file   Highest education level: 12th grade  Occupational History   Not on file  Tobacco Use   Smoking status: Never   Smokeless tobacco: Never  Vaping Use   Vaping status: Never Used  Substance and Sexual Activity   Alcohol use: No   Drug use: No   Sexual activity: Not on file    Comment: husband recently died from  myelodysplastic syndrome in January of 2014  Other Topics Concern   Not on file  Social History Narrative   1 Daughter, April   1 grandson-20, 1 granddaughter-4.   Keeps granddaughter during the week.   Social Determinants of Health   Financial Resource Strain: Low Risk  (11/22/2022)   Overall Financial Resource Strain (CARDIA)    Difficulty of Paying Living Expenses: Not hard at all  Food Insecurity: No Food Insecurity (11/22/2022)   Hunger Vital Sign    Worried About Running Out of Food in the Last Year: Never true    Ran Out of Food in the Last Year: Never true  Transportation Needs: No Transportation Needs (11/22/2022)   PRAPARE - Administrator, Civil Service (Medical): No    Lack of Transportation (Non-Medical): No  Physical Activity: Insufficiently Active (11/22/2022)   Exercise Vital Sign    Days of Exercise per Week: 3 days    Minutes of Exercise per Session: 30 min  Stress: No Stress Concern Present (11/22/2022)   [employer] of Occupational Health - Occupational Stress Questionnaire    Feeling of Stress : Only a little  Social Connections: Unknown (11/22/2022)   Social Connection and Isolation Panel [NHANES]    Frequency of Communication with Friends and Family: Patient declined    Frequency of Social Gatherings with Friends and Family: Patient declined    Attends Religious Services: Patient declined    Database administrator or Organizations: Patient declined    Attends Tax inspector Meetings: Not on file    Marital Status: Widowed   Observations/Objective: No exam performed.    Assessment:  Plan:    Unable to connect - no charge for today. Patient rescheduled to in person visit 8/22.    Follow Up Instructions:  I discussed the assessment and treatment plan with the patient. The patient was provided an opportunity to ask questions and all were answered. The patient agreed with the plan and demonstrated an understanding of the instructions.   The patient was advised to call back or seek an in-person evaluation if the symptoms worsen or if the condition fails to improve as anticipated.    Brooke Bonito, MSN, APRN, FNP-BC, AGACNP-BC [facility]

## 2023-01-07 NOTE — Progress Notes (Unsigned)
GI Office Note    Referring Provider: Donita Brooks, MD Primary Care Physician:  Donita Brooks, MD Primary Gastroenterologist: Gerrit Friends.Rourk, MD  Date:  01/10/2023  ID:  Caroline Valdez, DOB 1950-06-23, MRN [id number]   Chief Complaint   Chief Complaint  Patient presents with   Follow-up    States trulance quit working hasn't had a bm since Monday   History of Present Illness  Caroline Valdez is a 72 y.o. female with a history of von Willebrand disease, stroke, hypercholesteremia, GERD, fibromyalgia, endometriosis, breast cancer presenting today for follow-up.   Colonoscopy September 2020 with diverticulosis in the sigmoid and descending colon, redundant colon, otherwise normal.  No repeat advised.   Virtual visit 04/16/2022.  Taking pantoprazole and famotidine, still having reflux symptoms occasionally but has not had to use Tums in 1 month.  Has began having a cough at the beginning Linzess therefore she stopped this.  Notes that she has too much fiber she gets constipated.  Proximal staying hydrated.  Denies any straining.  Hemorrhoid improves with having better bowel movements.  Denies any frequent rectal bleeding.  Using Preparation H suppository about 2 weeks prior to visit due to itching and burning.  Advised to start Amitiza 8 mcg twice daily.  Linzess.  Encouraged adequate hydration.  May use over-the-counter hemorrhoid suppositories as needed.  Continue PPI and cimetidine twice daily.  Advised to follow-up in 3-4 months, sooner if needed and to notify if unable to obtain Amitiza.   Patient reports stomach cramping with Amitiza she is only taking 1/day until it was not working as well as Linzess did.  Had flare of hemorrhoids during this process.  She was advised that she may take 2 tablets in the morning if that is coming up this to have better bowel movements or consider trial of Trulance.  Also noted nausea with Amitiza.  Over-the-counter hemorrhoid cream helping with  hemorrhoids.  Patient ultimately requested to trial Trulance.  Samples were left at the desk for her.  After almost 1 week she requested prescription.   Last office visit 07/19/22.  Constipation doing well on Trulance.  Having daily soft bowel movements without any abdominal pain.  Reflux doing okay.  Sometimes has bad days of symptoms and will need Tums which helps provide relief.  Have been continued to take her PPI and cimetidine twice daily.  Hemorrhoids stable.  Using Preparation H cream and suppositories as needed.  Denied any rectal bleeding.  Advised to continue current regimen.  No medication changes made.  Today: GERD - Pulmonologist put her on Pepcid 20 mg nightly and took her off cimetidine. She is taking her pantoprazole twice daily. Symptoms are not worse since coming off cimetidine. She put her head of bed up 6 inches as well. No N/V. No dysphagia but feels clogged up in her throat some. Cough has gotten less - is increased with talking.   Constipation - Tacking trulance every day. No BM since Monday. The last couple weeks it has been getting harder to go and has been needing to strain. A few weeks ago she was taking cough syrup with codeine. Has not been taking anything additional with her trulance. Has had some straining but not hard stools. Has some abdominal tightness but no overt pain. Hemorrhoids have flared from straining. Has been using preparation H.    Current Outpatient Medications  Medication Sig Dispense Refill   acetaminophen (TYLENOL) 500 MG tablet Take 1,000 mg by mouth daily  as needed for headache.     ALPRAZolam (XANAX) 0.5 MG tablet TAKE 1 TABLET(0.5 MG) BY MOUTH AT BEDTIME AS NEEDED 60 tablet 2   famotidine (PEPCID) 20 MG tablet One at least an hour before bedtime 30 tablet 11   fluticasone (FLONASE) 50 MCG/ACT nasal spray Place 2 sprays into both nostrils daily. 16 g 6   HYDROcodone bit-homatropine (HYCODAN) 5-1.5 MG/5ML syrup Take 5 mLs by mouth every 4 (four)  hours as needed for cough. 473 mL 0   montelukast (SINGULAIR) 10 MG tablet TAKE 1 TABLET AT BEDTIME 90 tablet 0   Multiple Vitamin (MULTIVITAMIN WITH MINERALS) TABS tablet Take 1 tablet by mouth daily.     ondansetron (ZOFRAN) 4 MG tablet TAKE 1 TABLET(4 MG) BY MOUTH EVERY 8 HOURS AS NEEDED FOR NAUSEA OR VOMITING 20 tablet 0   pantoprazole (PROTONIX) 40 MG tablet TAKE 1 TABLET TWICE A DAY 180 tablet 3   rosuvastatin (CRESTOR) 20 MG tablet TAKE 1 TABLET DAILY 90 tablet 1   TRULANCE 3 MG TABS TAKE 1 TABLET DAILY 90 tablet 3   No current facility-administered medications for this visit.    Past Medical History:  Diagnosis Date   Blood dyscrasia    Breast cancer (HCC)    left   Complication of anesthesia    1980S  TUBAL LIG, + HEMM SURGERY PT PASSED OUT AND WENT INTO SHOCK.  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    Endometriosis    Fibromyalgia    GERD (gastroesophageal reflux disease)    Hypercholesteremia    Mini stroke 09/2012   Radiculopathy    Stroke [facility])    MINI STROKE   Von Willebrand disease (HCC) 09/11/2013   Von Willebrand's disease [facility])    history of    Past Surgical History:  Procedure Laterality Date   ABDOMINAL HYSTERECTOMY     AUGMENTATION MAMMAPLASTY Right    BACK SURGERY     BREAST IMPLANT REMOVAL Right 12/05/2017   BREAST IMPLANT REMOVAL Right 12/05/2017   Procedure: REMOVAL RIGHT  BREAST IMPLANT MATERIAL;  Surgeon: Etter Sjogren, MD;  Location: [facility] OR;  Service: Plastics;  Laterality: Right;   BREAST RECONSTRUCTION     BREAST RECONSTRUCTION Right 12/05/2017   Procedure: DELAYED BREAST RECONSTRUCTION WITH SILICONE IMPLANT;  Surgeon: Etter Sjogren, MD;  Location: [facility] OR;  Service: Plastics;  Laterality: Right;   CARPAL TUNNEL RELEASE     right   COLONOSCOPY  01/2010   Dr. Franky Macho: normal   COLONOSCOPY WITH PROPOFOL N/A 01/22/2019   Procedure: COLONOSCOPY WITH PROPOFOL;  Surgeon: Corbin Ade, MD;  [facility];   Service: Endoscopy;  Laterality: N/A;  2:30pm - can not come earlier due to transportation   FINGER SURGERY     LEFT THUMB   HEMORRHOID SURGERY     X2   LAMINECTOMY     MASTECTOMY     left   SHOULDER SURGERY     SKIN CANCER EXCISION     TONSILLECTOMY     TUBAL LIGATION      Family History  Problem Relation Age of Onset   Breast cancer Mother 59       now 25yo   Stroke Father        Father had no full sisters   Breast cancer Sister 71       now 70yo; had TAH/BSO   Lung cancer Brother        died  63yo; smoker    Allergies as of 01/10/2023 - Review Complete 01/10/2023  Allergen Reaction Noted   Aspirin Other (See Comments) 02/15/2011   Hydrocodone Itching 03/31/2018   Oxycodone Itching 03/31/2018   Tape Dermatitis 06/21/2015    Social History   Socioeconomic History   Marital status: Widowed    Spouse name: Not on file   Number of children: 1   Years of education: Not on file   Highest education level: 12th grade  Occupational History   Not on file  Tobacco Use   Smoking status: Never   Smokeless tobacco: Never  Vaping Use   Vaping status: Never Used  Substance and Sexual Activity   Alcohol use: No   Drug use: No   Sexual activity: Not on file    Comment: husband recently died from myelodysplastic syndrome in January of 2014  Other Topics Concern   Not on file  Social History Narrative   1 Daughter, April   1 grandson-20, 1 granddaughter-4.   Keeps granddaughter during the week.   Social Determinants of Health   Financial Resource Strain: Low Risk  (11/22/2022)   Overall Financial Resource Strain (CARDIA)    Difficulty of Paying Living Expenses: Not hard at all  Food Insecurity: No Food Insecurity (11/22/2022)   Hunger Vital Sign    Worried About Running Out of Food in the Last Year: Never true    Ran Out of Food in the Last Year: Never true  Transportation Needs: No Transportation Needs (11/22/2022)   PRAPARE - Administrator, Civil Service  (Medical): No    Lack of Transportation (Non-Medical): No  Physical Activity: Insufficiently Active (11/22/2022)   Exercise Vital Sign    Days of Exercise per Week: 3 days    Minutes of Exercise per Session: 30 min  Stress: No Stress Concern Present (11/22/2022)   [employer] of Occupational Health - Occupational Stress Questionnaire    Feeling of Stress : Only a little  Social Connections: Unknown (11/22/2022)   Social Connection and Isolation Panel [NHANES]    Frequency of Communication with Friends and Family: Patient declined    Frequency of Social Gatherings with Friends and Family: Patient declined    Attends Religious Services: Patient declined    Database administrator or Organizations: Patient declined    Attends Banker Meetings: Not on file    Marital Status: Widowed     Review of Systems   Gen: Denies fever, chills, anorexia. Denies fatigue, weakness, weight loss.  CV: Denies chest pain, palpitations, syncope, peripheral edema, and claudication. Resp: + cough. Denies dyspnea at rest, wheezing, coughing up blood, and pleurisy. GI: See HPI Derm: Denies rash, itching, dry skin Psych: Denies depression, anxiety, memory loss, confusion. No homicidal or suicidal ideation.  Heme: Denies bruising, bleeding, and enlarged lymph nodes.   Physical Exam   BP 137/76 (BP Location: Right Arm, Patient Position: Sitting, Cuff Size: Normal)   Pulse 82   Temp 98.6 F (37 C) (Oral)   Ht 5\' 5"  (1.651 m)   Wt 134 lb 9.6 oz (61.1 kg)   SpO2 94%   BMI 22.40 kg/m   General:   Alert and oriented. No distress noted. Pleasant and cooperative.  Head:  Normocephalic and atraumatic. Eyes:  Conjuctiva clear without scleral icterus. Mouth:  Oral mucosa pink and moist. Good dentition. No lesions. Lungs:  Clear to auscultation bilaterally. No wheezes, rales, or rhonchi. No distress.  Heart:  S1, S2 present  without murmurs appreciated.  Abdomen:  +BS, soft, non-tender and  non-distended. No rebound or guarding. No HSM or masses noted. Rectal: deferred Msk:  Symmetrical without gross deformities. Normal posture. Extremities:  Without edema. Neurologic:  Alert and  oriented x4 Psych:  Alert and cooperative. Normal mood and affect.   Assessment  Caroline Valdez is a 72 y.o. female with a history of von Willebrand disease, stroke, hypercholesteremia, GERD, fibromyalgia, endometriosis, breast cancer presenting today for follow-up.  GERD: Pulmonologist recently took her off cimetidine which she was taking twice daily previously.  Currently on pantoprazole 40 mg twice daily and famotidine 20 mg nightly.  No worsening in her reflux since this change.  Still uses Tums as needed but has been working on also elevating her head of bed.  Cough seems to be better after her recent treatment with cough syrup with codeine.  Constipation: Previously was doing very well on Trulance although for several weeks prior she was having to strain a little bit more frequently.  Has not had a bowel movement since Monday.  Will add MiraLAX to her Trulance and if no improvement after couple weeks then we will consider changing back to Linzess given it is now suspected that Linzess did not cause her cough previously.  Has failed Amitiza in the past due to abdominal cramping.  Advised that if no BM in the next 48 hours then she should use a dulcolax suppository.  Advised that if needed she could use MiraLAX twice daily.  Hemorrhoids: Recent need for straining with her constipation has caused a flare in her hemorrhoids with some mild bleeding however she has been able to control it with over-the-counter Preparation H.  Will continue with this regimen.  PLAN   Trulance 3 mg once daily. Add miralax 17g once nightly.  Continue pantoprazole 40 mg twice daily. Continue famotidine 20 mg nightly.  Use suppository if no BM in 2 more days Progress report in 2 weeks Continue over-the-counter  Preparation H for hemorrhoids GERD lifestyle modifications.  Follow up in 3 months.     Brooke Bonito, MSN, FNP-BC, AGACNP-BC [facility]

## 2023-01-10 ENCOUNTER — Other Ambulatory Visit: Payer: Self-pay | Admitting: Family Medicine

## 2023-01-10 ENCOUNTER — Encounter: Payer: Self-pay | Admitting: Gastroenterology

## 2023-01-10 ENCOUNTER — Ambulatory Visit (INDEPENDENT_AMBULATORY_CARE_PROVIDER_SITE_OTHER): Payer: Medicare Other | Admitting: Gastroenterology

## 2023-01-10 VITALS — BP 137/76 | HR 82 | Temp 98.6°F | Ht 65.0 in | Wt 134.6 lb

## 2023-01-10 DIAGNOSIS — K5904 Chronic idiopathic constipation: Secondary | ICD-10-CM | POA: Diagnosis not present

## 2023-01-10 DIAGNOSIS — K649 Unspecified hemorrhoids: Secondary | ICD-10-CM | POA: Diagnosis not present

## 2023-01-10 DIAGNOSIS — K219 Gastro-esophageal reflux disease without esophagitis: Secondary | ICD-10-CM

## 2023-01-10 NOTE — Patient Instructions (Addendum)
Continue Trulance 3 mg once daily.  Please add MiraLAX 17 g in 8 ounces of water once nightly or in the morning whichever you prefer.  If you do not have a bowel movement by Saturday please take a Dulcolax suppository.  Please have me know by the end of next week how you are doing with the initiation of MiraLAX.  Continue your pantoprazole 40 mg twice daily and famotidine 20 mg nightly.  Continue using Preparation H as needed for your hemorrhoids.  If you have significant discomfort with your hemorrhoids you can also obtain over-the-counter rectal lidocaine and apply this for relief.  We will plan to follow-up in 3 months.  If you are doing well after the addition of MiraLAX we can make the 6 months if you prefer.  It was a pleasure to see you today. I want to create trusting relationships with patients. If you receive a survey regarding your visit,  I greatly appreciate you taking time to fill this out on paper or through your MyChart. I value your feedback.  Brooke Bonito, MSN, FNP-BC, AGACNP-BC [facility]

## 2023-01-11 NOTE — Telephone Encounter (Signed)
Due to a system glitch the last office visit is not detected for this practice.   LOV 12/13/2022.    Requested Prescriptions  Pending Prescriptions Disp Refills   pantoprazole (PROTONIX) 40 MG tablet [Pharmacy Med Name: PANTOPRAZOLE SODIUM DR TABS 40MG ] 180 tablet 3    Sig: TAKE 1 TABLET TWICE A DAY     Gastroenterology: Proton Pump Inhibitors Failed - 01/10/2023  3:04 PM      Failed - Valid encounter within last 12 months    Recent Outpatient Visits           1 year ago Pure hypercholesterolemia   [facility] Pickard, Priscille Heidelberg, MD   1 year ago Contusion of right periocular region, initial encounter   [facility] Pickard, Priscille Heidelberg, MD   1 year ago Pure hypercholesterolemia   [facility] Tanya Nones, Priscille Heidelberg, MD   1 year ago Acute bacterial rhinosinusitis   [facility] Donita Brooks, MD   2 years ago History of TIA (transient ischemic attack)   [facility] Pickard, Priscille Heidelberg, MD       Future Appointments             In 2 weeks Sherene Sires, Charlaine Dalton, MD [facility]

## 2023-01-14 ENCOUNTER — Telehealth: Payer: Self-pay | Admitting: *Deleted

## 2023-01-14 NOTE — Telephone Encounter (Signed)
Pt called and states that the MiraLax is working. She has had several bowel movements, since starting the MiraLax and yes she is taking the Trulance also.

## 2023-01-21 ENCOUNTER — Other Ambulatory Visit: Payer: Self-pay | Admitting: Family Medicine

## 2023-01-23 ENCOUNTER — Encounter: Payer: Self-pay | Admitting: Family Medicine

## 2023-01-23 ENCOUNTER — Ambulatory Visit (INDEPENDENT_AMBULATORY_CARE_PROVIDER_SITE_OTHER): Payer: Medicare Other | Admitting: Family Medicine

## 2023-01-23 VITALS — BP 128/74 | HR 91 | Temp 97.8°F | Ht 65.0 in | Wt 132.0 lb

## 2023-01-23 DIAGNOSIS — J0110 Acute frontal sinusitis, unspecified: Secondary | ICD-10-CM | POA: Insufficient documentation

## 2023-01-23 MED ORDER — AMOXICILLIN-POT CLAVULANATE 875-125 MG PO TABS: 1.0000 | ORAL_TABLET | Freq: Two times a day (BID) | ORAL | 0 refills | Status: DC

## 2023-01-23 NOTE — Progress Notes (Signed)
Subjective:  HPI: Caroline Valdez is a 72 y.o. female presenting on 01/23/2023 for Sinusitis   HPI Patient is in today for 1 week of  lingering sinus pressure, congestion, mucopurulent rhinorrhea, ear pain, and worsening pressure when she bends over. She was covid positive 9 days ago and has been sick for 2 weeks now. Denies fever, malaise, chills. She has tried OTC mucolytic and Flonase.    Review of Systems  All other systems reviewed and are negative.   Relevant past medical history reviewed and updated as indicated.   Past Medical History:  Diagnosis Date   Blood dyscrasia    Breast cancer (HCC)    left   Complication of anesthesia    1980S  TUBAL LIG, + HEMM SURGERY PT PASSED OUT AND WENT INTO SHOCK.  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    Endometriosis    Fibromyalgia    GERD (gastroesophageal reflux disease)    Hypercholesteremia    Mini stroke 09/2012   Radiculopathy    Stroke [facility])    MINI STROKE   Von Willebrand disease (HCC) 09/11/2013   Von Willebrand's disease [facility])    history of     Past Surgical History:  Procedure Laterality Date   ABDOMINAL HYSTERECTOMY     AUGMENTATION MAMMAPLASTY Right    BACK SURGERY     BREAST IMPLANT REMOVAL Right 12/05/2017   BREAST IMPLANT REMOVAL Right 12/05/2017   Procedure: REMOVAL RIGHT  BREAST IMPLANT MATERIAL;  Surgeon: Etter Sjogren, MD;  Location: [facility] OR;  Service: Plastics;  Laterality: Right;   BREAST RECONSTRUCTION     BREAST RECONSTRUCTION Right 12/05/2017   Procedure: DELAYED BREAST RECONSTRUCTION WITH SILICONE IMPLANT;  Surgeon: Etter Sjogren, MD;  Location: [facility] OR;  Service: Plastics;  Laterality: Right;   CARPAL TUNNEL RELEASE     right   COLONOSCOPY  01/2010   Dr. Franky Macho: normal   COLONOSCOPY WITH PROPOFOL N/A 01/22/2019   Procedure: COLONOSCOPY WITH PROPOFOL;  Surgeon: Corbin Ade, MD;  [facility];  Service: Endoscopy;  Laterality: N/A;  2:30pm - can not  come earlier due to transportation   FINGER SURGERY     LEFT THUMB   HEMORRHOID SURGERY     X2   LAMINECTOMY     MASTECTOMY     left   SHOULDER SURGERY     SKIN CANCER EXCISION     TONSILLECTOMY     TUBAL LIGATION      Allergies and medications reviewed and updated.   Current Outpatient Medications:    acetaminophen (TYLENOL) 500 MG tablet, Take 1,000 mg by mouth daily as needed for headache., Disp: , Rfl:    ALPRAZolam (XANAX) 0.5 MG tablet, TAKE 1 TABLET(0.5 MG) BY MOUTH AT BEDTIME AS NEEDED, Disp: 60 tablet, Rfl: 2   amoxicillin-clavulanate (AUGMENTIN) 875-125 MG tablet, Take 1 tablet by mouth 2 (two) times daily., Disp: 20 tablet, Rfl: 0   famotidine (PEPCID) 20 MG tablet, One at least an hour before bedtime, Disp: 30 tablet, Rfl: 11   fluticasone (FLONASE) 50 MCG/ACT nasal spray, Place 2 sprays into both nostrils daily., Disp: 16 g, Rfl: 6   HYDROcodone bit-homatropine (HYCODAN) 5-1.5 MG/5ML syrup, Take 5 mLs by mouth every 4 (four) hours as needed for cough., Disp: 473 mL, Rfl: 0   montelukast (SINGULAIR) 10 MG tablet, TAKE 1 TABLET AT BEDTIME, Disp: 90 tablet, Rfl: 0   Multiple Vitamin (MULTIVITAMIN WITH MINERALS) TABS tablet,  Take 1 tablet by mouth daily., Disp: , Rfl:    ondansetron (ZOFRAN) 4 MG tablet, TAKE 1 TABLET(4 MG) BY MOUTH EVERY 8 HOURS AS NEEDED FOR NAUSEA OR VOMITING, Disp: 20 tablet, Rfl: 0   pantoprazole (PROTONIX) 40 MG tablet, TAKE 1 TABLET TWICE A DAY, Disp: 180 tablet, Rfl: 3   rosuvastatin (CRESTOR) 20 MG tablet, TAKE 1 TABLET DAILY, Disp: 90 tablet, Rfl: 1   TRULANCE 3 MG TABS, TAKE 1 TABLET DAILY, Disp: 90 tablet, Rfl: 3  Allergies  Allergen Reactions   Aspirin Other (See Comments)    Pt has Von Willebrands Disease   Hydrocodone Itching   Oxycodone Itching   Tape Dermatitis    Pulls off skin    Objective:   BP 128/74   Pulse 91   Temp 97.8 F (36.6 C) (Oral)   Ht 5\' 5"  (1.651 m)   Wt 132 lb (59.9 kg)   SpO2 97%   BMI 21.97 kg/m       01/23/2023   11:07 AM 01/10/2023    9:17 AM 01/07/2023    3:29 PM  Vitals with BMI  Height 5\' 5"  5\' 5"  5\' 5"   Weight 132 lbs 134 lbs 10 oz 134 lbs  BMI 21.97 22.4 22.3  Systolic 128 137   Diastolic 74 76   Pulse 91 82      Physical Exam Vitals and nursing note reviewed.  Constitutional:      Appearance: Normal appearance. She is normal weight.  HENT:     Head: Normocephalic and atraumatic.     Right Ear: Tympanic membrane, ear canal and external ear normal.     Left Ear: Tympanic membrane, ear canal and external ear normal.     Nose:     Right Sinus: Maxillary sinus tenderness and frontal sinus tenderness present.     Left Sinus: Maxillary sinus tenderness and frontal sinus tenderness present.     Mouth/Throat:     Mouth: Mucous membranes are moist.     Pharynx: Oropharynx is clear.  Eyes:     Conjunctiva/sclera: Conjunctivae normal.  Cardiovascular:     Rate and Rhythm: Normal rate and regular rhythm.     Pulses: Normal pulses.     Heart sounds: Normal heart sounds.  Pulmonary:     Effort: Pulmonary effort is normal.     Breath sounds: Normal breath sounds.  Musculoskeletal:     Cervical back: No tenderness.  Lymphadenopathy:     Cervical: No cervical adenopathy.  Skin:    General: Skin is warm and dry.  Neurological:     General: No focal deficit present.     Mental Status: She is alert and oriented to person, place, and time. Mental status is at baseline.  Psychiatric:        Mood and Affect: Mood normal.        Behavior: Behavior normal.        Thought Content: Thought content normal.        Judgment: Judgment normal.     Assessment & Plan:  Acute non-recurrent frontal sinusitis Assessment & Plan: Patients symptoms consistent with sinusitis with mucopurulent rhinorrhea and sinus pain. Given duration of symptoms >10 days will treat with Augmentin BID for 10 days. Continue OTC symptomatic management. Return to office if symptoms persist or worsen.   Other  orders -     Amoxicillin-Pot Clavulanate; Take 1 tablet by mouth 2 (two) times daily.  Dispense: 20 tablet; Refill: 0  Follow up plan: Return if symptoms worsen or fail to improve.  Park Meo, FNP

## 2023-01-23 NOTE — Telephone Encounter (Signed)
Requested medication (s) are due for refill today: Yes  Requested medication (s) are on the active medication list: No  Last refill:  12/03/22  Future visit scheduled: No  Notes to clinic:  Unable to refill per protocol, cannot delegate. Medication not on current list     Requested Prescriptions  Pending Prescriptions Disp Refills   traMADol (ULTRAM) 50 MG tablet [Pharmacy Med Name: TRAMADOL 50MG  TABLETS] 60 tablet     Sig: TAKE 1 TABLET(50 MG) BY MOUTH EVERY 6 HOURS AS NEEDED FOR COUGH     Not Delegated - Analgesics:  Opioid Agonists Failed - 01/21/2023  7:49 PM      Failed - This refill cannot be delegated      Failed - Urine Drug Screen completed in last 360 days      Failed - Valid encounter within last 3 months    Recent Outpatient Visits           1 year ago Pure hypercholesterolemia   [facility] Pickard, Priscille Heidelberg, MD   1 year ago Contusion of right periocular region, initial encounter   [facility] Pickard, Priscille Heidelberg, MD   1 year ago Pure hypercholesterolemia   [facility] Tanya Nones, Priscille Heidelberg, MD   1 year ago Acute bacterial rhinosinusitis   [facility] Donita Brooks, MD   2 years ago History of TIA (transient ischemic attack)   [facility] Pickard, Priscille Heidelberg, MD       Future Appointments             In 1 week Sherene Sires, Charlaine Dalton, MD [facility]

## 2023-01-23 NOTE — Assessment & Plan Note (Addendum)
Patients symptoms consistent with sinusitis with mucopurulent rhinorrhea and sinus pain. Given duration of symptoms >10 days will treat with Augmentin BID for 10 days. Continue OTC symptomatic management. Return to office if symptoms persist or worsen.

## 2023-01-29 NOTE — Progress Notes (Unsigned)
Caroline Valdez, female    DOB: 07/15/1950    MRN: [id number]   Brief patient profile:  75  yowf  never smoker with h/o severe gerd  referred to pulmonary clinic in [place]  12/28/2022 by Dr Tanya Nones  for onset  of cough fall of 2023 (? Covid related)  in setting of new onset rhinitis spring > fall which started  well  prior to covid  rx singulair flonase clariton no better even on  prednisone / no prev  eval by allergist   Has h/o GERD induced choking requiring EGD/ dilation though nothing in EPIC to this effect.   History of Present Illness  12/28/2022  Pulmonary/ 1st office eval/ Caroline Valdez / [place] Office  Chief Complaint  Patient presents with   Establish Care   Cough  Dyspnea:  chases 5 year olds around / up and down steps, housework s limiting doe Cough: esp brought on by speaking  but not fumes/  flares  p hs maybe twice a week not in early in am's and starts  30 min to an hour p stirring in am> mostly dry / using lots of cough drops with menthol "only thing that helps"  Sleep: on side/ chokes on back / wood under the headboard only 2 in though SABA use: none  02: no Rec Pantoprazole 40  mg continue to  Take 30- 60 min before your first and last meals of the day  Stop tagamet and take Pepcid 20 mg about an hour before bed and stop clariton and try  For drainage / throat tickle try take CHLORPHENIRAMINE  4 mg  Depomedrol 120 mg IM  Try again hydrocodan cough syrup every 4 hours until no cough x 3 days then off. GERD diet reviewed, bed blocks rec    Allergy screen 12/28/2022 >  Eos 0.1 /  IgE  30  Please schedule a follow up office visit in 4 weeks, sooner if needed  with all medications /inhalers/ solutions in hand     01/31/2023  f/u ov/[place] office/Caroline Valdez re: *** maint on *** did *** bring meds  No chief complaint on file.   Dyspnea:  *** Cough: *** Sleeping: ***   resp cc  SABA use: *** 02: ***  Lung cancer screening: ***   No obvious day to day or daytime  variability or assoc excess/ purulent sputum or mucus plugs or hemoptysis or cp or chest tightness, subjective wheeze or overt sinus or hb symptoms.    Also denies any obvious fluctuation of symptoms with weather or environmental changes or other aggravating or alleviating factors except as outlined above   No unusual exposure hx or h/o childhood pna/ asthma or knowledge of premature birth.  Current Allergies, Complete Past Medical History, Past Surgical History, Family History, and Social History were reviewed in [employer] record.  ROS  The following are not active complaints unless bolded Hoarseness, sore throat, dysphagia, dental problems, itching, sneezing,  nasal congestion or discharge of excess mucus or purulent secretions, ear ache,   fever, chills, sweats, unintended wt loss or wt gain, classically pleuritic or exertional cp,  orthopnea pnd or arm/hand swelling  or leg swelling, presyncope, palpitations, abdominal pain, anorexia, nausea, vomiting, diarrhea  or change in bowel habits or change in bladder habits, change in stools or change in urine, dysuria, hematuria,  rash, arthralgias, visual complaints, headache, numbness, weakness or ataxia or problems with walking or coordination,  change in mood or  memory.  No outpatient medications have been marked as taking for the 01/31/23 encounter (Appointment) with Nyoka Cowden, MD.           Past Medical History:  Diagnosis Date   Blood dyscrasia    Breast cancer (HCC)    left   Complication of anesthesia    1980S  TUBAL LIG, + HEMM SURGERY PT PASSED OUT AND WENT INTO SHOCK.  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    Endometriosis    Fibromyalgia    GERD (gastroesophageal reflux disease)    Hypercholesteremia    Mini stroke 09/2012   Radiculopathy    Stroke [facility])    MINI STROKE   Von Willebrand disease (HCC) 09/11/2013   Von Willebrand's disease [facility])    history of       Objective:    Wt Readings from Last 3 Encounters:  01/23/23 132 lb (59.9 kg)  01/10/23 134 lb 9.6 oz (61.1 kg)  01/07/23 134 lb (60.8 kg)      Vital signs reviewed  01/31/2023  - Note at rest 02 sats  ***% on ***   General appearance:    ***      mild urge to cough at end exp maneuvers ***       I personally reviewed images and agree with radiology impression as follows:  CXR:   pa and lateral 11/26/22 Wnl  Assessment

## 2023-01-30 DIAGNOSIS — H353130 Nonexudative age-related macular degeneration, bilateral, stage unspecified: Secondary | ICD-10-CM | POA: Diagnosis not present

## 2023-01-31 ENCOUNTER — Ambulatory Visit (INDEPENDENT_AMBULATORY_CARE_PROVIDER_SITE_OTHER): Payer: Medicare Other | Admitting: Internal Medicine

## 2023-01-31 ENCOUNTER — Encounter: Payer: Self-pay | Admitting: Internal Medicine

## 2023-01-31 VITALS — BP 142/79 | HR 86 | Ht 65.0 in | Wt 134.0 lb

## 2023-01-31 DIAGNOSIS — R058 Other specified cough: Secondary | ICD-10-CM | POA: Diagnosis not present

## 2023-01-31 MED ORDER — AZELASTINE-FLUTICASONE 137-50 MCG/ACT NA SUSP: 1.0000 | Freq: Two times a day (BID) | NASAL | 11 refills | Status: DC

## 2023-01-31 NOTE — Assessment & Plan Note (Signed)
Onset fall 2023 in setting of seasonal rhinitis x ? 10 y - Allergy screen 12/28/2022 >  Eos 0.1 /  IgE  30 - cyclical cough rx 12/28/2022 >>> 50% improved,  hs better p h1 h2 but not taking h1 daytime so and active pnds @ ov 01/31/2023 so rec try h1 daytime and if too sleepy zytec plus new main rx with dymista if can afford it and atovent NS if can't  > ent eval next if not satisfied   Still strongly favor dx of Upper airway cough syndrome (previously labeled PNDS),  is so named because it's frequently impossible to sort out how much is  CR/sinusitis with freq throat clearing (which can be related to primary GERD)   vs  causing  secondary (" extra esophageal")  GERD from wide swings in gastric pressure that occur with throat clearing, often  promoting self use of mint and menthol lozenges that reduce the lower esophageal sphincter tone and exacerbate the problem further in a cyclical fashion.   These are the same pts (now being labeled as having "irritable larynx syndrome" by some cough centers) who not infrequently have a history of having failed to tolerate ace inhibitors,  dry powder inhalers or biphosphonates or report having atypical/extraesophageal reflux symptoms that don't respond to standard doses of PPI  and are easily confused as having aecopd or asthma flares by even experienced allergists/ pulmonologists (myself included).   See plan above, f/u pulmonary prn   Discussed in detail all the  indications, usual  risks and alternatives  relative to the benefits with patient who agrees to proceed with Rx as outlined.             Each maintenance medication was reviewed in detail including emphasizing most importantly the difference between maintenance and prns and under what circumstances the prns are to be triggered using an action plan format where appropriate.  Total time for H and P, chart review, counseling, reviewing nasal  device(s) and generating customized AVS unique to this office visit  / same day charting  > 30 min final summary f/u ov

## 2023-01-31 NOTE — Patient Instructions (Addendum)
1st try the  Allergy relief during the day to see if helps the drainage and if not, there's no reason to try  zyrtec 10 mg in AM but it works like allergy relieve during the but doesn't make you as sleepy   Best way to treat to the drainage is dymista (generic version)  one twice daily - if effective you 'll need less of the above as needed antihistamines over the long run  and if too expensive can try atrovent nasal spray (call for prescription).  If still not satisfied let me refer you to [facility] in [facility]  Pulmonary follow up is as needed

## 2023-02-06 ENCOUNTER — Other Ambulatory Visit: Payer: Self-pay | Admitting: Family Medicine

## 2023-02-07 NOTE — Telephone Encounter (Signed)
Requested Prescriptions  Pending Prescriptions Disp Refills   montelukast (SINGULAIR) 10 MG tablet [Pharmacy Med Name: MONTELUKAST SODIUM TABS 10MG ] 90 tablet 0    Sig: TAKE 1 TABLET AT BEDTIME     Pulmonology:  Leukotriene Inhibitors Failed - 02/06/2023  3:33 PM      Failed - Valid encounter within last 12 months    Recent Outpatient Visits           1 year ago Pure hypercholesterolemia   [facility] Pickard, Priscille Heidelberg, MD   1 year ago Contusion of right periocular region, initial encounter   [facility] Pickard, Priscille Heidelberg, MD   1 year ago Pure hypercholesterolemia   [facility] Donita Brooks, MD   2 years ago Acute bacterial rhinosinusitis   [facility] Donita Brooks, MD   2 years ago History of TIA (transient ischemic attack)   [facility] Pickard, Priscille Heidelberg, MD

## 2023-02-16 ENCOUNTER — Other Ambulatory Visit: Payer: Self-pay | Admitting: Family Medicine

## 2023-02-18 NOTE — Telephone Encounter (Signed)
Requested Prescriptions  Pending Prescriptions Disp Refills   rosuvastatin (CRESTOR) 20 MG tablet [Pharmacy Med Name: ROSUVASTATIN TABS 20MG ] 90 tablet 2    Sig: TAKE 1 TABLET DAILY     Cardiovascular:  Antilipid - Statins 2 Failed - 02/16/2023 12:28 PM      Failed - Cr in normal range and within 360 days    Creat  Date Value Ref Range Status  09/18/2022 1.02 (H) 0.60 - 1.00 mg/dL Final         Failed - Valid encounter within last 12 months    Recent Outpatient Visits           1 year ago Pure hypercholesterolemia   [facility] Pickard, Priscille Heidelberg, MD   1 year ago Contusion of right periocular region, initial encounter   [facility] Pickard, Priscille Heidelberg, MD   1 year ago Pure hypercholesterolemia   [facility] Donita Brooks, MD   2 years ago Acute bacterial rhinosinusitis   [facility] Donita Brooks, MD   2 years ago History of TIA (transient ischemic attack)   [facility] Donita Brooks, MD              Failed - Lipid Panel in normal range within the last 12 months    Cholesterol  Date Value Ref Range Status  09/18/2022 164 <200 mg/dL Final   LDL Cholesterol (Calc)  Date Value Ref Range Status  09/18/2022 63 mg/dL (calc) Final    Comment:    Reference range: <100 . Desirable range <100 mg/dL for primary prevention;   <70 mg/dL for patients with CHD or diabetic patients  with > or = 2 CHD risk factors. Marland Kitchen LDL-C is now calculated using the Martin-Hopkins  calculation, which is a validated novel method providing  better accuracy than the Friedewald equation in the  estimation of LDL-C.  Horald Pollen et al. Lenox Ahr. 1610;960(45): 2061-2068  (http://education.[facility].com/faq/FAQ164)    HDL  Date Value Ref Range Status  09/18/2022 83 > OR = 50 mg/dL Final   Triglycerides  Date Value Ref Range Status  09/18/2022 96 <150 mg/dL Final         Passed - Patient is not  pregnant

## 2023-02-22 ENCOUNTER — Other Ambulatory Visit: Payer: Self-pay | Admitting: Gastroenterology

## 2023-02-22 DIAGNOSIS — K5904 Chronic idiopathic constipation: Secondary | ICD-10-CM

## 2023-02-22 MED ORDER — TRULANCE 3 MG PO TABS: 1.0000 | ORAL_TABLET | Freq: Every day | ORAL | 3 refills | Status: DC

## 2023-02-27 DIAGNOSIS — L570 Actinic keratosis: Secondary | ICD-10-CM | POA: Diagnosis not present

## 2023-02-27 DIAGNOSIS — D485 Neoplasm of uncertain behavior of skin: Secondary | ICD-10-CM | POA: Diagnosis not present

## 2023-02-27 DIAGNOSIS — Z85828 Personal history of other malignant neoplasm of skin: Secondary | ICD-10-CM | POA: Diagnosis not present

## 2023-03-15 ENCOUNTER — Other Ambulatory Visit: Payer: Self-pay | Admitting: Family Medicine

## 2023-03-15 DIAGNOSIS — K5904 Chronic idiopathic constipation: Secondary | ICD-10-CM

## 2023-03-15 MED ORDER — TRULANCE 3 MG PO TABS
1.0000 | ORAL_TABLET | Freq: Every day | ORAL | 3 refills | Status: DC
Start: 2023-03-15 — End: 2024-04-10

## 2023-03-20 ENCOUNTER — Other Ambulatory Visit: Payer: Medicare Other

## 2023-03-20 ENCOUNTER — Ambulatory Visit: Payer: Medicare Other

## 2023-03-20 DIAGNOSIS — Z23 Encounter for immunization: Secondary | ICD-10-CM

## 2023-03-20 DIAGNOSIS — E7800 Pure hypercholesterolemia, unspecified: Secondary | ICD-10-CM | POA: Diagnosis not present

## 2023-03-20 NOTE — Progress Notes (Signed)
Pt came in for flu vaccine. Injection given on the L-upper arm. Pt tol injection well w/no c/o per pt. Pt left ambulatory w/no c/o

## 2023-03-21 LAB — CBC WITH DIFFERENTIAL/PLATELET
Absolute Lymphocytes: 1302 {cells}/uL (ref 850–3900)
Absolute Monocytes: 442 {cells}/uL (ref 200–950)
Basophils Absolute: 32 {cells}/uL (ref 0–200)
Basophils Relative: 0.7 %
Eosinophils Absolute: 41 {cells}/uL (ref 15–500)
Eosinophils Relative: 0.9 %
HCT: 42.5 % (ref 35.0–45.0)
Hemoglobin: 13.9 g/dL (ref 11.7–15.5)
MCH: 30.8 pg (ref 27.0–33.0)
MCHC: 32.7 g/dL (ref 32.0–36.0)
MCV: 94.2 fL (ref 80.0–100.0)
MPV: 10.2 fL (ref 7.5–12.5)
Monocytes Relative: 9.6 %
Neutro Abs: 2783 {cells}/uL (ref 1500–7800)
Neutrophils Relative %: 60.5 %
Platelets: 208 10*3/uL (ref 140–400)
RBC: 4.51 10*6/uL (ref 3.80–5.10)
RDW: 12.8 % (ref 11.0–15.0)
Total Lymphocyte: 28.3 %
WBC: 4.6 10*3/uL (ref 3.8–10.8)

## 2023-03-21 LAB — COMPLETE METABOLIC PANEL WITH GFR
AG Ratio: 2 (calc) (ref 1.0–2.5)
ALT: 20 U/L (ref 6–29)
AST: 26 U/L (ref 10–35)
Albumin: 4.4 g/dL (ref 3.6–5.1)
Alkaline phosphatase (APISO): 41 U/L (ref 37–153)
BUN: 16 mg/dL (ref 7–25)
CO2: 31 mmol/L (ref 20–32)
Calcium: 9.3 mg/dL (ref 8.6–10.4)
Chloride: 103 mmol/L (ref 98–110)
Creat: 0.85 mg/dL (ref 0.60–1.00)
Globulin: 2.2 g/dL (ref 1.9–3.7)
Glucose, Bld: 93 mg/dL (ref 65–99)
Potassium: 4 mmol/L (ref 3.5–5.3)
Sodium: 142 mmol/L (ref 135–146)
Total Bilirubin: 0.6 mg/dL (ref 0.2–1.2)
Total Protein: 6.6 g/dL (ref 6.1–8.1)
eGFR: 72 mL/min/{1.73_m2} (ref >=60)

## 2023-03-21 LAB — LIPID PANEL
Cholesterol: 173 mg/dL (ref <200)
HDL: 69 mg/dL (ref >=50)
LDL Cholesterol (Calc): 84 mg/dL
Non-HDL Cholesterol (Calc): 104 mg/dL (ref <130)
Total CHOL/HDL Ratio: 2.5 (calc) (ref <5.0)
Triglycerides: 102 mg/dL (ref <150)

## 2023-03-24 ENCOUNTER — Encounter: Payer: Self-pay | Admitting: Family Medicine

## 2023-04-10 ENCOUNTER — Encounter: Payer: Self-pay | Admitting: Family Medicine

## 2023-04-11 ENCOUNTER — Other Ambulatory Visit: Payer: Self-pay

## 2023-04-11 ENCOUNTER — Ambulatory Visit: Payer: Medicare Other | Admitting: Gastroenterology

## 2023-04-11 DIAGNOSIS — S0990XA Unspecified injury of head, initial encounter: Secondary | ICD-10-CM

## 2023-04-11 DIAGNOSIS — R053 Chronic cough: Secondary | ICD-10-CM

## 2023-04-11 DIAGNOSIS — K219 Gastro-esophageal reflux disease without esophagitis: Secondary | ICD-10-CM

## 2023-04-11 DIAGNOSIS — R058 Other specified cough: Secondary | ICD-10-CM

## 2023-04-11 DIAGNOSIS — E7800 Pure hypercholesterolemia, unspecified: Secondary | ICD-10-CM

## 2023-04-11 MED ORDER — ROSUVASTATIN CALCIUM 20 MG PO TABS: 20.0000 mg | ORAL_TABLET | Freq: Every day | ORAL | 2 refills | Status: DC

## 2023-04-11 MED ORDER — PANTOPRAZOLE SODIUM 40 MG PO TBEC: 40.0000 mg | DELAYED_RELEASE_TABLET | Freq: Two times a day (BID) | ORAL | 2 refills | Status: DC

## 2023-04-11 MED ORDER — MONTELUKAST SODIUM 10 MG PO TABS: ORAL_TABLET | ORAL | 2 refills | Status: DC

## 2023-05-06 ENCOUNTER — Ambulatory Visit: Payer: Medicare Other | Admitting: Family Medicine

## 2023-05-07 ENCOUNTER — Ambulatory Visit (INDEPENDENT_AMBULATORY_CARE_PROVIDER_SITE_OTHER): Payer: Medicare Other | Admitting: Family Medicine

## 2023-05-07 VITALS — BP 126/62 | HR 98 | Temp 98.8°F | Ht 65.0 in | Wt 136.2 lb

## 2023-05-07 DIAGNOSIS — M25511 Pain in right shoulder: Secondary | ICD-10-CM

## 2023-05-07 NOTE — Progress Notes (Signed)
Subjective:    Patient ID: Caroline Valdez, female    DOB: September 08, 1950, 72 y.o.   MRN: [id number]  Patient is a very pleasant 72 year old Caucasian female who presents with 5 weeks of pain in her right shoulder.  She hurt her right shoulder trying to lift a book bag and rotated externally and place it in the seat behind her.  Afterwards she complains of pain in her right shoulder with abduction greater than 90 degrees with external rotation.  Patient has pain with Hawkins maneuver.  She has pain when he can testing however strength is relatively good.  No pain with active abduction greater than 90 degrees. Past Medical History:  Diagnosis Date   Blood dyscrasia    Breast cancer (HCC)    left   Complication of anesthesia    1980S  TUBAL LIG, + HEMM SURGERY PT PASSED OUT AND WENT INTO SHOCK.  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    Endometriosis    Fibromyalgia    GERD (gastroesophageal reflux disease)    Hypercholesteremia    Mini stroke 09/2012   Radiculopathy    Stroke [facility])    MINI STROKE   Von Willebrand disease (HCC) 09/11/2013   Von Willebrand's disease [facility])    history of   Past Surgical History:  Procedure Laterality Date   ABDOMINAL HYSTERECTOMY     AUGMENTATION MAMMAPLASTY Right    BACK SURGERY     BREAST IMPLANT REMOVAL Right 12/05/2017   BREAST IMPLANT REMOVAL Right 12/05/2017   Procedure: REMOVAL RIGHT  BREAST IMPLANT MATERIAL;  Surgeon: Etter Sjogren, MD;  Location[facility] OR;  Service: Plastics;  Laterality: Right;   BREAST RECONSTRUCTION     BREAST RECONSTRUCTION Right 12/05/2017   Procedure: DELAYED BREAST RECONSTRUCTION WITH SILICONE IMPLANT;  Surgeon: Etter Sjogren, MD;  Location[facility] OR;  Service: Plastics;  Laterality: Right;   CARPAL TUNNEL RELEASE     right   COLONOSCOPY  01/2010   Dr. Franky Macho: normal   COLONOSCOPY WITH PROPOFOL N/A 01/22/2019   Procedure: COLONOSCOPY WITH PROPOFOL;  Surgeon: Corbin Ade, MD;  Location: [facility]  ENDO SUITE;  Service: Endoscopy;  Laterality: N/A;  2:30pm - can not come earlier due to transportation   FINGER SURGERY     LEFT THUMB   HEMORRHOID SURGERY     X2   LAMINECTOMY     MASTECTOMY     left   SHOULDER SURGERY     SKIN CANCER EXCISION     TONSILLECTOMY     TUBAL LIGATION     Current Outpatient Medications on File Prior to Visit  Medication Sig Dispense Refill   acetaminophen (TYLENOL) 500 MG tablet Take 1,000 mg by mouth daily as needed for headache.     ALPRAZolam (XANAX) 0.5 MG tablet TAKE 1 TABLET(0.5 MG) BY MOUTH AT BEDTIME AS NEEDED 60 tablet 2   amoxicillin-clavulanate (AUGMENTIN) 875-125 MG tablet Take 1 tablet by mouth 2 (two) times daily. 20 tablet 0   Azelastine-Fluticasone (DYMISTA) 137-50 MCG/ACT SUSP Place 1 puff into the nose 2 (two) times daily. 23 g 11   chlorpheniramine (ALLERGY RELIEF) 4 MG tablet      famotidine (PEPCID) 20 MG tablet One at least an hour before bedtime 30 tablet 11   fluticasone (FLONASE) 50 MCG/ACT nasal spray Place 2 sprays into both nostrils daily. 16 g 6   HYDROcodone bit-homatropine (HYCODAN) 5-1.5 MG/5ML syrup Take 5 mLs by mouth every 4 (four)  hours as needed for cough. 473 mL 0   montelukast (SINGULAIR) 10 MG tablet TAKE 1 TABLET AT BEDTIME 90 tablet 2   Multiple Vitamin (MULTIVITAMIN WITH MINERALS) TABS tablet Take 1 tablet by mouth daily.     ondansetron (ZOFRAN) 4 MG tablet TAKE 1 TABLET(4 MG) BY MOUTH EVERY 8 HOURS AS NEEDED FOR NAUSEA OR VOMITING 20 tablet 0   pantoprazole (PROTONIX) 40 MG tablet Take 1 tablet (40 mg total) by mouth 2 (two) times daily. 180 tablet 2   Plecanatide (TRULANCE) 3 MG TABS Take 1 tablet (3 mg total) by mouth daily. 90 tablet 3   rosuvastatin (CRESTOR) 20 MG tablet Take 1 tablet (20 mg total) by mouth daily. 90 tablet 2   traMADol (ULTRAM) 50 MG tablet Take 1 tablet (50 mg total) by mouth every 6 (six) hours as needed. 60 tablet 2   No current facility-administered medications on file prior to visit.    Allergies  Allergen Reactions   Aspirin Other (See Comments)    Pt has Von Willebrands Disease   Hydrocodone Itching   Oxycodone Itching   Tape Dermatitis    Pulls off skin   Social History   Socioeconomic History   Marital status: Widowed    Spouse name: Not on file   Number of children: 1   Years of education: Not on file   Highest education level: 12th grade  Occupational History   Not on file  Tobacco Use   Smoking status: Never   Smokeless tobacco: Never  Vaping Use   Vaping status: Never Used  Substance and Sexual Activity   Alcohol use: No   Drug use: No   Sexual activity: Not on file    Comment: husband recently died from myelodysplastic syndrome in January of 2014  Other Topics Concern   Not on file  Social History Narrative   1 Daughter, April   1 grandson-20, 1 granddaughter-4.   Keeps granddaughter during the week.   Social Drivers of Corporate investment banker Strain: Low Risk  (05/03/2023)   Overall Financial Resource Strain (CARDIA)    Difficulty of Paying Living Expenses: Not hard at all  Food Insecurity: No Food Insecurity (05/03/2023)   Hunger Vital Sign    Worried About Running Out of Food in the Last Year: Never true    Ran Out of Food in the Last Year: Never true  Transportation Needs: No Transportation Needs (05/03/2023)   PRAPARE - Administrator, Civil Service (Medical): No    Lack of Transportation (Non-Medical): No  Physical Activity: Insufficiently Active (05/03/2023)   Exercise Vital Sign    Days of Exercise per Week: 2 days    Minutes of Exercise per Session: 30 min  Stress: No Stress Concern Present (05/03/2023)   [employer] of Occupational Health - Occupational Stress Questionnaire    Feeling of Stress : Only a little  Social Connections: Unknown (05/03/2023)   Social Connection and Isolation Panel [NHANES]    Frequency of Communication with Friends and Family: Patient declined    Frequency of Social  Gatherings with Friends and Family: Patient declined    Attends Religious Services: Patient declined    Database administrator or Organizations: Patient declined    Attends Banker Meetings: Not on file    Marital Status: Widowed  Intimate Partner Violence: Not At Risk (09/08/2021)   Humiliation, Afraid, Rape, and Kick questionnaire    Fear of Current or Ex-Partner: No  Emotionally Abused: No    Physically Abused: No    Sexually Abused: No   Family History  Problem Relation Age of Onset   Breast cancer Mother 75       now 35yo   Stroke Father        Father had no full sisters   Breast cancer Sister 36       now 15yo; had TAH/BSO   Lung cancer Brother        died 43yo; smoker     Review of Systems  All other systems reviewed and are negative.      Objective:   Physical Exam Vitals reviewed.  Constitutional:      General: She is not in acute distress.    Appearance: She is well-developed. She is not diaphoretic.  HENT:     Head: Normocephalic and atraumatic.     Nose:     Right Turbinates: Not enlarged.     Left Turbinates: Not enlarged.     Right Sinus: No maxillary sinus tenderness or frontal sinus tenderness.     Left Sinus: No maxillary sinus tenderness or frontal sinus tenderness.  Eyes:     Conjunctiva/sclera: Conjunctivae normal.  Cardiovascular:     Rate and Rhythm: Normal rate and regular rhythm.     Heart sounds: Normal heart sounds. No murmur heard.    No friction rub. No gallop.  Pulmonary:     Effort: Pulmonary effort is normal. No respiratory distress.     Breath sounds: Normal breath sounds. No wheezing or rales.  Chest:     Chest wall: No tenderness.  Musculoskeletal:     Right shoulder: Tenderness present. No bony tenderness or crepitus. Decreased range of motion. Normal strength.           Assessment & Plan:  Acute pain of right shoulder Based on her history and her exam, I believe that the patient has rotator cuff  tendinitis.  I do not see any sign of obvious complete tear.  We discussed her options and the patient elected sleep examination.  Instead technique, I injected the right subacromial space with 2 cc of lidocaine, 2 cc Marcaine, and 2 cc of 40 mg/mL Kenalog.  The patient tolerated the procedure well without complication.  Reassess in 1 to 2 weeks and recommend imaging of the shoulder if pain is not better

## 2023-05-08 ENCOUNTER — Encounter: Payer: Self-pay | Admitting: Family Medicine

## 2023-05-08 MED ORDER — TRIAMCINOLONE ACETONIDE 40 MG/ML IJ SUSP
80.0000 mg | Freq: Once | INTRAMUSCULAR | Status: AC
  Administered 2023-05-07: 80 mg via INTRA_ARTICULAR

## 2023-05-08 NOTE — Addendum Note (Signed)
Addended by: Venia Carbon K on: 05/08/2023 04:33 PM   Modules accepted: Orders

## 2023-05-15 ENCOUNTER — Encounter: Payer: Self-pay | Admitting: Family Medicine

## 2023-05-17 ENCOUNTER — Other Ambulatory Visit: Payer: Self-pay | Admitting: Family Medicine

## 2023-05-17 DIAGNOSIS — S46001A Unspecified injury of muscle(s) and tendon(s) of the rotator cuff of right shoulder, initial encounter: Secondary | ICD-10-CM

## 2023-05-23 ENCOUNTER — Encounter: Payer: Self-pay | Admitting: Family Medicine

## 2023-05-23 ENCOUNTER — Ambulatory Visit: Payer: Medicare Other | Admitting: Family Medicine

## 2023-05-23 VITALS — BP 120/72 | HR 101 | Temp 98.7°F | Ht 65.0 in | Wt 133.0 lb

## 2023-05-23 DIAGNOSIS — M79661 Pain in right lower leg: Secondary | ICD-10-CM | POA: Diagnosis not present

## 2023-05-23 DIAGNOSIS — J0110 Acute frontal sinusitis, unspecified: Secondary | ICD-10-CM | POA: Diagnosis not present

## 2023-05-23 NOTE — Assessment & Plan Note (Signed)
 No red flags on exam. My suspicion for DVT is low however after discussion of this possibility she would like to proceed with [place] to rule out. Discussed when to seek medical care such as warmth, redness, and swelling.

## 2023-05-23 NOTE — Assessment & Plan Note (Signed)
 Acute onset sinusitis without severe symptoms or associated fever. Encouraged continuing OTC flonase and neti pot, tylenol PRN, and guaifenesin. Return to office if symptoms persist or worsen.

## 2023-05-23 NOTE — Progress Notes (Signed)
 Subjective:  HPI: Caroline Valdez is a 73 y.o. female presenting on 05/23/2023 for Acute Visit (Sinus infection/Also per pt started a few months ago w/her R-lower leg (back) feels like its warm but when pt goes to feel it, it is cool. Per pt it feel like it inside on the muscle.)   HPI Patient is in today for 3 days sinus pressure, right ear pain, postnasal drip, rhinorrhea, cough. Denies fever, chills, body aches Has tried Coricidin and cough drops  She is also concerned today about right calf sensation of warmth and cramping for a few months. She denies swelling, redness, or warmth to touch. This lasts a few seconds and goes away on its own. She has not had any recent surgery or travel, history of clots.   Review of Systems  All other systems reviewed and are negative.   Relevant past medical history reviewed and updated as indicated.   Past Medical History:  Diagnosis Date   Blood dyscrasia    Breast cancer (HCC)    left   Complication of anesthesia    1980S  TUBAL LIG, + HEMM SURGERY PT PASSED OUT AND WENT INTO SHOCK.  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    Endometriosis    Fibromyalgia    GERD (gastroesophageal reflux disease)    Hypercholesteremia    Mini stroke 09/2012   Radiculopathy    Stroke [facility])    MINI STROKE   Von Willebrand disease (HCC) 09/11/2013   Von Willebrand's disease [facility])    history of     Past Surgical History:  Procedure Laterality Date   ABDOMINAL HYSTERECTOMY     AUGMENTATION MAMMAPLASTY Right    BACK SURGERY     BREAST IMPLANT REMOVAL Right 12/05/2017   BREAST IMPLANT REMOVAL Right 12/05/2017   Procedure: REMOVAL RIGHT  BREAST IMPLANT MATERIAL;  Surgeon: Leora Lenis, MD;  Location: [facility] OR;  Service: Plastics;  Laterality: Right;   BREAST RECONSTRUCTION     BREAST RECONSTRUCTION Right 12/05/2017   Procedure: DELAYED BREAST RECONSTRUCTION WITH SILICONE IMPLANT;  Surgeon: Leora Lenis, MD;  Location: [facility] OR;   Service: Plastics;  Laterality: Right;   CARPAL TUNNEL RELEASE     right   COLONOSCOPY  01/2010   Dr. Oneil Budge: normal   COLONOSCOPY WITH PROPOFOL  N/A 01/22/2019   Procedure: COLONOSCOPY WITH PROPOFOL ;  Surgeon: Shaaron [name], MD;  [facility];  Service: Endoscopy;  Laterality: N/A;  2:30pm - can not come earlier due to transportation   FINGER SURGERY     LEFT THUMB   HEMORRHOID SURGERY     X2   LAMINECTOMY     MASTECTOMY     left   SHOULDER SURGERY     SKIN CANCER EXCISION     TONSILLECTOMY     TUBAL LIGATION      Allergies and medications reviewed and updated.   Current Outpatient Medications:    acetaminophen  (TYLENOL ) 500 MG tablet, Take 1,000 mg by mouth daily as needed for headache., Disp: , Rfl:    ALPRAZolam  (XANAX ) 0.5 MG tablet, TAKE 1 TABLET(0.5 MG) BY MOUTH AT BEDTIME AS NEEDED, Disp: 60 tablet, Rfl: 2   Azelastine -Fluticasone  (DYMISTA ) 137-50 MCG/ACT SUSP, Place 1 puff into the nose 2 (two) times daily., Disp: 23 g, Rfl: 11   chlorpheniramine (ALLERGY RELIEF) 4 MG tablet, , Disp: , Rfl:    famotidine  (PEPCID ) 20 MG tablet, One at least an hour before bedtime,  Disp: 30 tablet, Rfl: 11   montelukast  (SINGULAIR ) 10 MG tablet, TAKE 1 TABLET AT BEDTIME, Disp: 90 tablet, Rfl: 2   Multiple Vitamin (MULTIVITAMIN WITH MINERALS) TABS tablet, Take 1 tablet by mouth daily., Disp: , Rfl:    ondansetron  (ZOFRAN ) 4 MG tablet, TAKE 1 TABLET(4 MG) BY MOUTH EVERY 8 HOURS AS NEEDED FOR NAUSEA OR VOMITING, Disp: 20 tablet, Rfl: 0   pantoprazole  (PROTONIX ) 40 MG tablet, Take 1 tablet (40 mg total) by mouth 2 (two) times daily., Disp: 180 tablet, Rfl: 2   Plecanatide  (TRULANCE ) 3 MG TABS, Take 1 tablet (3 mg total) by mouth daily., Disp: 90 tablet, Rfl: 3   rosuvastatin  (CRESTOR ) 20 MG tablet, Take 1 tablet (20 mg total) by mouth daily., Disp: 90 tablet, Rfl: 2   traMADol  (ULTRAM ) 50 MG tablet, Take 1 tablet (50 mg total) by mouth every 6 (six) hours as needed., Disp: 60  tablet, Rfl: 2  Allergies  Allergen Reactions   Aspirin Other (See Comments)    Pt has Von Willebrands Disease   Hydrocodone  Itching   Oxycodone  Itching   Tape Dermatitis    Pulls off skin    Objective:   BP 120/72   Pulse (!) 101   Temp 98.7 F (37.1 C) (Oral)   Ht 5' 5 (1.651 m)   Wt 133 lb (60.3 kg)   SpO2 96%   BMI 22.13 kg/m      05/23/2023    4:07 PM 05/07/2023   12:12 PM 01/31/2023    9:02 AM  Vitals with BMI  Height 5' 5 5' 5 5' 5  Weight 133 lbs 136 lbs 3 oz 134 lbs  BMI 22.13 22.66 22.3  Systolic 120 126 857  Diastolic 72 62 79  Pulse 101 98 86     Physical Exam Vitals and nursing note reviewed.  Constitutional:      Appearance: Normal appearance. She is normal weight.  HENT:     Head: Normocephalic and atraumatic.     Right Ear: Tympanic membrane, ear canal and external ear normal.     Left Ear: Tympanic membrane, ear canal and external ear normal.     Nose: Congestion present.     Right Sinus: No maxillary sinus tenderness or frontal sinus tenderness.     Left Sinus: No maxillary sinus tenderness or frontal sinus tenderness.     Mouth/Throat:     Mouth: Mucous membranes are moist.     Pharynx: Oropharynx is clear.  Eyes:     Conjunctiva/sclera: Conjunctivae normal.  Cardiovascular:     Rate and Rhythm: Normal rate and regular rhythm.     Pulses: Normal pulses.     Heart sounds: Normal heart sounds.  Pulmonary:     Effort: Pulmonary effort is normal.     Breath sounds: Normal breath sounds.  Musculoskeletal:     Right lower leg: Normal.     Left lower leg: Normal.  Skin:    General: Skin is warm and dry.  Neurological:     General: No focal deficit present.     Mental Status: She is alert and oriented to person, place, and time. Mental status is at baseline.  Psychiatric:        Mood and Affect: Mood normal.        Behavior: Behavior normal.        Thought Content: Thought content normal.        Judgment: Judgment normal.      Assessment & Plan:  Acute non-recurrent frontal sinusitis Assessment & Plan: Acute onset sinusitis without severe symptoms or associated fever. Encouraged continuing OTC flonase  and neti pot, tylenol  PRN, and guaifenesin . Return to office if symptoms persist or worsen.   Right calf pain Assessment & Plan: No red flags on exam. My suspicion for DVT is low however after discussion of this possibility she would like to proceed with US  to rule out. Discussed when to seek medical care such as warmth, redness, and swelling.   Orders: -     US  Venous Img Lower Unilateral Right (DVT); Future     Follow up plan: Return if symptoms worsen or fail to improve.  Jeoffrey GORMAN Barrio, FNP

## 2023-05-27 ENCOUNTER — Ambulatory Visit
Admission: RE | Admit: 2023-05-27 | Discharge: 2023-05-27 | Disposition: A | Discharge status: Home / Self Care | Payer: Medicare Other | Source: Ambulatory Visit | Attending: Family Medicine | Admitting: Family Medicine

## 2023-05-27 DIAGNOSIS — M79661 Pain in right lower leg: Secondary | ICD-10-CM

## 2023-05-27 DIAGNOSIS — S46001A Unspecified injury of muscle(s) and tendon(s) of the rotator cuff of right shoulder, initial encounter: Secondary | ICD-10-CM

## 2023-05-27 DIAGNOSIS — M25511 Pain in right shoulder: Secondary | ICD-10-CM | POA: Diagnosis not present

## 2023-05-27 DIAGNOSIS — G8929 Other chronic pain: Secondary | ICD-10-CM | POA: Diagnosis not present

## 2023-05-27 DIAGNOSIS — M79651 Pain in right thigh: Secondary | ICD-10-CM | POA: Diagnosis not present

## 2023-05-29 ENCOUNTER — Ambulatory Visit
Admission: RE | Admit: 2023-05-29 | Discharge: 2023-05-29 | Disposition: A | Discharge status: Home / Self Care | Payer: Medicare Other | Source: Ambulatory Visit | Attending: Nurse Practitioner | Admitting: Nurse Practitioner

## 2023-05-29 ENCOUNTER — Other Ambulatory Visit: Payer: Self-pay

## 2023-05-29 VITALS — BP 131/83 | HR 103 | Temp 98.0°F | Resp 20

## 2023-05-29 DIAGNOSIS — B9689 Other specified bacterial agents as the cause of diseases classified elsewhere: Secondary | ICD-10-CM

## 2023-05-29 DIAGNOSIS — J019 Acute sinusitis, unspecified: Secondary | ICD-10-CM

## 2023-05-29 MED ORDER — BENZONATATE 100 MG PO CAPS: 100.0000 mg | ORAL_CAPSULE | Freq: Three times a day (TID) | ORAL | 0 refills | Status: DC | PRN

## 2023-05-29 MED ORDER — AMOXICILLIN-POT CLAVULANATE 875-125 MG PO TABS: 1.0000 | ORAL_TABLET | Freq: Two times a day (BID) | ORAL | 0 refills | Status: AC

## 2023-05-29 NOTE — Discharge Instructions (Addendum)
 You have a sinus infection.  Take the Augmentin  as prescribed to treat it.  Symptoms should improve over the next week to 10 days.  If you develop chest pain or shortness of breath, go to the emergency room.  Some things that can make you feel better are: - Increased rest - Increasing fluid with water/sugar free electrolytes - Acetaminophen  and ibuprofen as needed for fever/pain - Salt water gargling, chloraseptic spray and throat lozenges - OTC guaifenesin  (Mucinex ) 600 mg twice daily - Saline sinus flushes or a neti pot - Humidifying the air -Tessalon  Perles every 8 hours as needed for dry cough

## 2023-05-29 NOTE — ED Triage Notes (Signed)
 Pt reports "possible sinus infection" and continued nasal congestion, bilateral ear pain x8 days.

## 2023-05-29 NOTE — ED Provider Notes (Signed)
 [facility]    CSN: [id number] Arrival date & time: 05/29/23  1023      History   Chief Complaint Chief Complaint  Patient presents with   Nasal Congestion    Have had sinus infection for over a week.  Now side of my head hurts from my ears down my neck on both sides and top of head hurts if I bend over. - Entered by patient    HPI Caroline Valdez is a 73 y.o. female.   Patient presents today with 8-day history of dry cough, runny and stuffy nose, postnasal drainage, scratchy throat, sinus pressure behind her eyes and pain in her head when she leans forward, did stop it, and fatigue.  Also began having sharp bilateral neck pain when opening her jaw beginning yesterday.  No fever, body aches or chills, shortness of breath or chest pain, ear pain, abdominal pain, nausea/vomiting, or diarrhea.  Has been taking Mucinex  and Ludens cough drops for symptoms with minimal improvement.  Reports granddaughter was sick with similar symptoms prior to her symptoms starting.    Past Medical History:  Diagnosis Date   Blood dyscrasia    Breast cancer (HCC)    left   Complication of anesthesia    1980S  TUBAL LIG, + HEMM SURGERY PT PASSED OUT AND WENT INTO SHOCK.  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    Endometriosis    Fibromyalgia    GERD (gastroesophageal reflux disease)    Hypercholesteremia    Mini stroke 09/2012   Radiculopathy    Stroke [facility])    MINI STROKE   Von Willebrand disease (HCC) 09/11/2013   Von Willebrand's disease [facility])    history of    Patient Active Problem List   Diagnosis Date Noted   Right calf pain 05/23/2023   Acute non-recurrent frontal sinusitis 01/23/2023   Upper airway cough syndrome 12/28/2022   GERD (gastroesophageal reflux disease) 10/15/2018   Personal history of breast cancer 12/05/2017   Osteoporosis 07/14/2014   Von Willebrand disease (HCC) 09/11/2013   Hypercholesteremia    Ductal carcinoma in situ  (DCIS) of left breast    Dyslipidemia 09/03/2011   Type 1 von Willebrand disease (HCC) 09/03/2011   Malignant neoplasm of breast (HCC) 04/03/2011   Radiculopathy 11/08/2010   Fibromyalgia 11/08/2010    Past Surgical History:  Procedure Laterality Date   ABDOMINAL HYSTERECTOMY     AUGMENTATION MAMMAPLASTY Right    BACK SURGERY     BREAST IMPLANT REMOVAL Right 12/05/2017   BREAST IMPLANT REMOVAL Right 12/05/2017   Procedure: REMOVAL RIGHT  BREAST IMPLANT MATERIAL;  Surgeon: Leora Lenis, MD;  Location: [facility] OR;  Service: Plastics;  Laterality: Right;   BREAST RECONSTRUCTION     BREAST RECONSTRUCTION Right 12/05/2017   Procedure: DELAYED BREAST RECONSTRUCTION WITH SILICONE IMPLANT;  Surgeon: Leora Lenis, MD;  Location: [facility] OR;  Service: Plastics;  Laterality: Right;   CARPAL TUNNEL RELEASE     right   COLONOSCOPY  01/2010   Dr. Oneil Budge: normal   COLONOSCOPY WITH PROPOFOL  N/A 01/22/2019   Procedure: COLONOSCOPY WITH PROPOFOL ;  Surgeon: Shaaron [name], MD;  [facility];  Service: Endoscopy;  Laterality: N/A;  2:30pm - can not come earlier due to transportation   FINGER SURGERY     LEFT THUMB   HEMORRHOID SURGERY     X2   LAMINECTOMY     MASTECTOMY     left  SHOULDER SURGERY     SKIN CANCER EXCISION     TONSILLECTOMY     TUBAL LIGATION      OB History   No obstetric history on file.      Home Medications    Prior to Admission medications   Medication Sig Start Date End Date Taking? Authorizing Provider  amoxicillin -clavulanate (AUGMENTIN ) 875-125 MG tablet Take 1 tablet by mouth 2 (two) times daily for 7 days. 05/29/23 06/05/23 Yes Chandra Harlene LABOR, NP  benzonatate  (TESSALON ) 100 MG capsule Take 1 capsule (100 mg total) by mouth 3 (three) times daily as needed for cough. Do not take with alcohol or while driving or operating heavy machinery.  May cause drowsiness. 05/29/23  Yes Chandra Harlene LABOR, NP  acetaminophen  (TYLENOL ) 500 MG tablet Take 1,000 mg by  mouth daily as needed for headache.    [provider]  ALPRAZolam  (XANAX ) 0.5 MG tablet TAKE 1 TABLET(0.5 MG) BY MOUTH AT BEDTIME AS NEEDED 11/15/22   Duanne Butler DASEN, MD  Azelastine -Fluticasone  (DYMISTA ) 137-50 MCG/ACT SUSP Place 1 puff into the nose 2 (two) times daily. 01/31/23   Darlean Ozell NOVAK, MD  chlorpheniramine (ALLERGY RELIEF) 4 MG tablet  12/27/22   [provider]  famotidine  (PEPCID ) 20 MG tablet One at least an hour before bedtime 12/28/22   Darlean Ozell NOVAK, MD  montelukast  (SINGULAIR ) 10 MG tablet TAKE 1 TABLET AT BEDTIME 04/11/23   Duanne Butler DASEN, MD  Multiple Vitamin (MULTIVITAMIN WITH MINERALS) TABS tablet Take 1 tablet by mouth daily.    [provider]  ondansetron  (ZOFRAN ) 4 MG tablet TAKE 1 TABLET(4 MG) BY MOUTH EVERY 8 HOURS AS NEEDED FOR NAUSEA OR VOMITING 05/24/22   Duanne Butler DASEN, MD  pantoprazole  (PROTONIX ) 40 MG tablet Take 1 tablet (40 mg total) by mouth 2 (two) times daily. 04/11/23   Duanne Butler DASEN, MD  Plecanatide  (TRULANCE ) 3 MG TABS Take 1 tablet (3 mg total) by mouth daily. 03/15/23   Kennedy Charmaine CROME, NP  rosuvastatin  (CRESTOR ) 20 MG tablet Take 1 tablet (20 mg total) by mouth daily. 04/11/23   Duanne Butler DASEN, MD  traMADol  (ULTRAM ) 50 MG tablet Take 1 tablet (50 mg total) by mouth every 6 (six) hours as needed. 03/15/23   Duanne Butler DASEN, MD    Family History Family History  Problem Relation Age of Onset   Breast cancer Mother 75       now 47yo   Stroke Father        Father had no full sisters   Breast cancer Sister 68       now 53yo; had TAH/BSO   Lung cancer Brother        died 40yo; smoker    Social History Social History   Tobacco Use   Smoking status: Never   Smokeless tobacco: Never  Vaping Use   Vaping status: Never Used  Substance Use Topics   Alcohol use: No   Drug use: No     Allergies   Aspirin, Hydrocodone , Oxycodone , and Tape   Review of Systems Review of Systems Per HPI  Physical  Exam Triage Vital Signs ED Triage Vitals  Encounter Vitals Group     BP 05/29/23 1042 131/83     Systolic BP Percentile --      Diastolic BP Percentile --      Pulse Rate 05/29/23 1042 (!) 103     Resp 05/29/23 1042 20     Temp 05/29/23 1042 98  F (36.7 C)     Temp Source 05/29/23 1042 Oral     SpO2 05/29/23 1042 95 %     Weight --      Height --      Head Circumference --      Peak Flow --      Pain Score 05/29/23 1041 3     Pain Loc --      Pain Education --      Exclude from Growth Chart --    No data found.  Updated Vital Signs BP 131/83 (BP Location: Right Arm)   Pulse (!) 103   Temp 98 F (36.7 C) (Oral)   Resp 20   SpO2 95%   Visual Acuity Right Eye Distance:   Left Eye Distance:   Bilateral Distance:    Right Eye Near:   Left Eye Near:    Bilateral Near:     Physical Exam Vitals and nursing note reviewed.  Constitutional:      General: She is not in acute distress.    Appearance: Normal appearance. She is not ill-appearing or toxic-appearing.  HENT:     Head: Normocephalic and atraumatic.     Right Ear: Tympanic membrane, ear canal and external ear normal.     Left Ear: Tympanic membrane, ear canal and external ear normal.     Nose: No congestion or rhinorrhea.     Right Sinus: Maxillary sinus tenderness and frontal sinus tenderness present.     Left Sinus: Maxillary sinus tenderness and frontal sinus tenderness present.     Mouth/Throat:     Mouth: Mucous membranes are moist.     Pharynx: Oropharynx is clear. Posterior oropharyngeal erythema present. No oropharyngeal exudate.     Comments: Post nasal drainage Eyes:     General: No scleral icterus.    Extraocular Movements: Extraocular movements intact.  Cardiovascular:     Rate and Rhythm: Normal rate and regular rhythm.  Pulmonary:     Effort: Pulmonary effort is normal. No respiratory distress.     Breath sounds: Normal breath sounds. No wheezing, rhonchi or rales.  Musculoskeletal:      Cervical back: Normal range of motion and neck supple.  Lymphadenopathy:     Cervical: No cervical adenopathy.  Skin:    General: Skin is warm and dry.     Capillary Refill: Capillary refill takes less than 2 seconds.     Coloration: Skin is not jaundiced or pale.     Findings: No erythema or rash.  Neurological:     Mental Status: She is alert and oriented to person, place, and time.  Psychiatric:        Behavior: Behavior is cooperative.      UC Treatments / Results  Labs (all labs ordered are listed, but only abnormal results are displayed) Labs Reviewed - No data to display  EKG   Radiology No results found.  Procedures Procedures (including critical care time)  Medications Ordered in UC Medications - No data to display  Initial Impression / Assessment and Plan / UC Course  I have reviewed the triage vital signs and the nursing notes.  Pertinent labs & imaging results that were available during my care of the patient were reviewed by me and considered in my medical decision making (see chart for details).   Patient is well-appearing, normotensive, afebrile, not tachypneic, oxygenating well on room air.  Patient is mildly tachycardic in triage today.    1.  Acute bacterial sinusitis  Treat with Augmentin  twice daily for 7 days Other supportive care discussed including cough suppressant medication, continue Mucinex  and plenty of hydration Return and ER precautions discussed with patient  The patient was given the opportunity to ask questions.  All questions answered to their satisfaction.  The patient is in agreement to this plan.    Final Clinical Impressions(s) / UC Diagnoses   Final diagnoses:  Acute bacterial sinusitis     Discharge Instructions      You have a sinus infection.  Take the Augmentin  as prescribed to treat it.  Symptoms should improve over the next week to 10 days.  If you develop chest pain or shortness of breath, go to the emergency  room.  Some things that can make you feel better are: - Increased rest - Increasing fluid with water/sugar free electrolytes - Acetaminophen  and ibuprofen as needed for fever/pain - Salt water gargling, chloraseptic spray and throat lozenges - OTC guaifenesin  (Mucinex ) 600 mg twice daily - Saline sinus flushes or a neti pot - Humidifying the air -Tessalon  Perles every 8 hours as needed for dry cough      ED Prescriptions     Medication Sig Dispense Auth. Provider   amoxicillin -clavulanate (AUGMENTIN ) 875-125 MG tablet Take 1 tablet by mouth 2 (two) times daily for 7 days. 14 tablet Chandra Raisin A, NP   benzonatate  (TESSALON ) 100 MG capsule Take 1 capsule (100 mg total) by mouth 3 (three) times daily as needed for cough. Do not take with alcohol or while driving or operating heavy machinery.  May cause drowsiness. 21 capsule Chandra Raisin LABOR, NP      PDMP not reviewed this encounter.   Chandra Raisin LABOR, NP 05/29/23 1057

## 2023-06-03 ENCOUNTER — Encounter: Payer: Self-pay | Admitting: Family Medicine

## 2023-06-04 ENCOUNTER — Other Ambulatory Visit: Payer: Self-pay

## 2023-06-04 DIAGNOSIS — S46001A Unspecified injury of muscle(s) and tendon(s) of the rotator cuff of right shoulder, initial encounter: Secondary | ICD-10-CM

## 2023-06-10 ENCOUNTER — Telehealth: Payer: Medicare Other | Admitting: Physician Assistant

## 2023-06-10 DIAGNOSIS — R3989 Other symptoms and signs involving the genitourinary system: Secondary | ICD-10-CM | POA: Diagnosis not present

## 2023-06-10 MED ORDER — CEPHALEXIN 500 MG PO CAPS: 500.0000 mg | ORAL_CAPSULE | Freq: Two times a day (BID) | ORAL | 0 refills | Status: DC

## 2023-06-10 MED ORDER — PHENAZOPYRIDINE HCL 100 MG PO TABS: 100.0000 mg | ORAL_TABLET | Freq: Three times a day (TID) | ORAL | 0 refills | Status: DC | PRN

## 2023-06-10 NOTE — Patient Instructions (Signed)
Raylene Miyamoto Edgren, thank you for joining [name], PA-C for today's virtual visit.  While this provider is not your primary care provider (PCP), if your PCP is located in our provider database this encounter information will be shared with them immediately following your visit.   A [facility] MyChart account gives you access to today's visit and all your visits, tests, and labs performed at [facility] " click here if you don't have a [facility] MyChart account or go to [email or web address]  Consent: (Patient) Caroline Valdez provided verbal consent for this virtual visit at the beginning of the encounter.  Current Medications:  Current Outpatient Medications:    cephALEXin (KEFLEX) 500 MG capsule, Take 1 capsule (500 mg total) by mouth 2 (two) times daily., Disp: 14 capsule, Rfl: 0   phenazopyridine (PYRIDIUM) 100 MG tablet, Take 1 tablet (100 mg total) by mouth 3 (three) times daily as needed for pain., Disp: 10 tablet, Rfl: 0   acetaminophen (TYLENOL) 500 MG tablet, Take 1,000 mg by mouth daily as needed for headache., Disp: , Rfl:    ALPRAZolam (XANAX) 0.5 MG tablet, TAKE 1 TABLET(0.5 MG) BY MOUTH AT BEDTIME AS NEEDED, Disp: 60 tablet, Rfl: 2   Azelastine-Fluticasone (DYMISTA) 137-50 MCG/ACT SUSP, Place 1 puff into the nose 2 (two) times daily., Disp: 23 g, Rfl: 11   benzonatate (TESSALON) 100 MG capsule, Take 1 capsule (100 mg total) by mouth 3 (three) times daily as needed for cough. Do not take with alcohol or while driving or operating heavy machinery.  May cause drowsiness., Disp: 21 capsule, Rfl: 0   chlorpheniramine (ALLERGY RELIEF) 4 MG tablet, , Disp: , Rfl:    famotidine (PEPCID) 20 MG tablet, One at least an hour before bedtime, Disp: 30 tablet, Rfl: 11   montelukast (SINGULAIR) 10 MG tablet, TAKE 1 TABLET AT BEDTIME, Disp: 90 tablet, Rfl: 2   Multiple Vitamin (MULTIVITAMIN WITH MINERALS) TABS tablet, Take 1 tablet by mouth daily., Disp: , Rfl:     ondansetron (ZOFRAN) 4 MG tablet, TAKE 1 TABLET(4 MG) BY MOUTH EVERY 8 HOURS AS NEEDED FOR NAUSEA OR VOMITING, Disp: 20 tablet, Rfl: 0   pantoprazole (PROTONIX) 40 MG tablet, Take 1 tablet (40 mg total) by mouth 2 (two) times daily., Disp: 180 tablet, Rfl: 2   Plecanatide (TRULANCE) 3 MG TABS, Take 1 tablet (3 mg total) by mouth daily., Disp: 90 tablet, Rfl: 3   rosuvastatin (CRESTOR) 20 MG tablet, Take 1 tablet (20 mg total) by mouth daily., Disp: 90 tablet, Rfl: 2   traMADol (ULTRAM) 50 MG tablet, Take 1 tablet (50 mg total) by mouth every 6 (six) hours as needed., Disp: 60 tablet, Rfl: 2   Medications ordered in this encounter:  Meds ordered this encounter  Medications   cephALEXin (KEFLEX) 500 MG capsule    Sig: Take 1 capsule (500 mg total) by mouth 2 (two) times daily.    Dispense:  14 capsule    Refill:  0    Supervising Provider:   Merrilee Jansky [[id number]]   phenazopyridine (PYRIDIUM) 100 MG tablet    Sig: Take 1 tablet (100 mg total) by mouth 3 (three) times daily as needed for pain.    Dispense:  10 tablet    Refill:  0    Supervising Provider:   Merrilee Jansky [phone]     *If you need refills on other medications prior to your next appointment, please contact your pharmacy*  Follow-Up: Call back or  seek an in-person evaluation if the symptoms worsen or if the condition fails to improve as anticipated.  [facility] [phone]  Other Instructions Urinary Tract Infection, Female A urinary tract infection (UTI) is an infection in your urinary tract. The urinary tract is made up of organs that make, store, and get rid of pee (urine) in your body. These organs include: The kidneys. The ureters. The bladder. The urethra. What are the causes? Most UTIs are caused by germs called bacteria. They may be in or near your genitals. These germs grow and cause swelling in your urinary tract. What increases the risk? You're more likely to get a UTI if: You're  a female. The urethra is shorter in females than in males. You have a soft tube called a catheter that drains your pee. You can't control when you pee or poop. You have trouble peeing because of: A kidney stone. A urinary blockage. A nerve condition that affects your bladder. Not getting enough to drink. You're sexually active. You use a birth control inside your vagina, like spermicide. You're pregnant. You have low levels of the hormone estrogen in your body. You're an older adult. You're also more likely to get a UTI if you have other health problems. These may include: Diabetes. A weak immune system. Your immune system is your body's defense system. Sickle cell disease. Injury of the spine. What are the signs or symptoms? Symptoms may include: Needing to pee right away. Peeing small amounts often. Pain or burning when you pee. Blood in your pee. Pee that smells bad or odd. Pain in your belly or lower back. You may also: Feel confused. This may be the first symptom in older adults. Vomit. Not feel hungry. Feel tired or easily annoyed. Have a fever or chills. How is this diagnosed? A UTI is diagnosed based on your medical history and an exam. You may also have other tests. These may include: Pee tests. Blood tests. Tests for sexually transmitted infections (STIs). If you've had more than one UTI, you may need to have imaging studies done to find out why you keep getting them. How is this treated? A UTI can be treated by: Taking antibiotics or other medicines. Drinking enough fluid to keep your pee pale yellow. In rare cases, a UTI can cause a very bad condition called sepsis. Sepsis may be treated in the hospital. Follow these instructions at home: Medicines Take your medicines only as told by your health care provider. If you were given antibiotics, take them as told by your provider. Do not stop taking them even if you start to feel better. General  instructions Make sure you: Pee often and fully. Do not hold your pee for a long time. Wipe from front to back after you pee or poop. Use each tissue only once when you wipe. Pee after you have sex. Do not douche or use sprays or powders in your genital area. Contact a health care provider if: Your symptoms don't get better after 1-2 days of taking antibiotics. Your symptoms go away and then come back. You have a fever or chills. You vomit or feel like you may vomit. Get help right away if: You have very bad pain in your back or lower belly. You faint. This information is not intended to replace advice given to you by your health care provider. Make sure you discuss any questions you have with your health care provider. Document Revised: 12/13/2022 Document Reviewed: 08/10/2022 Elsevier Patient  Education  2024 [employer].   If you have been instructed to have an in-person evaluation today at a local Urgent Care facility, please use the link below. It will take you to a list of all of our available [facility], including address, phone number and hours of operation. Please do not delay care.  [facility]  If you or a family member do not have a primary care provider, use the link below to schedule a visit and establish care. When you choose a [facility] primary care physician or advanced practice provider, you gain a long-term partner in health. Find a Primary Care Provider  Learn more about [facility]'s in-office and virtual care options: [facility] - Get Care Now

## 2023-06-10 NOTE — Progress Notes (Signed)
Virtual Visit Consent   Caroline Valdez, you are scheduled for a virtual visit with a [facility] provider today. Just as with appointments in the office, your consent must be obtained to participate. Your consent will be active for this visit and any virtual visit you may have with one of our providers in the next 365 days. If you have a MyChart account, a copy of this consent can be sent to you electronically.  As this is a virtual visit, video technology does not allow for your provider to perform a traditional examination. This may limit your provider's ability to fully assess your condition. If your provider identifies any concerns that need to be evaluated in person or the need to arrange testing (such as labs, EKG, etc.), we will make arrangements to do so. Although advances in technology are sophisticated, we cannot ensure that it will always work on either your end or our end. If the connection with a video visit is poor, the visit may have to be switched to a telephone visit. With either a video or telephone visit, we are not always able to ensure that we have a secure connection.  By engaging in this virtual visit, you consent to the provision of healthcare and authorize for your insurance to be billed (if applicable) for the services provided during this visit. Depending on your insurance coverage, you may receive a charge related to this service.  I need to obtain your verbal consent now. Are you willing to proceed with your visit today? Caroline Valdez has provided verbal consent on 06/10/2023 for a virtual visit (video or telephone). Caroline Loveless, PA-C  Date: 06/10/2023 6:14 PM  Virtual Visit via Video Note   I, Caroline Valdez, connected with  Caroline Valdez  ([id number], [birth year]) on 06/10/23 at  6:15 PM EST by a video-enabled telemedicine application and verified that I am speaking with the correct person using two identifiers.  Location: Patient: Virtual Visit  Location Patient: Home Provider: Virtual Visit Location Provider: Home Office   I discussed the limitations of evaluation and management by telemedicine and the availability of in person appointments. The patient expressed understanding and agreed to proceed.    History of Present Illness: Caroline Valdez is a 73 y.o. who identifies as a female who was assigned female at birth, and is being seen today for dysuria.  HPI: Urinary Tract Infection  This is a new problem. The current episode started today. The problem occurs every urination. The problem has been gradually worsening. The quality of the pain is described as burning and aching. The pain is moderate. There has been no fever. Associated symptoms include frequency, hematuria, hesitancy and urgency. Pertinent negatives include no chills, discharge, flank pain, nausea or vomiting. She has tried increased fluids (cystex) for the symptoms. The treatment provided no relief.     Problems:  Patient Active Problem List   Diagnosis Date Noted   Right calf pain 05/23/2023   Acute non-recurrent frontal sinusitis 01/23/2023   Upper airway cough syndrome 12/28/2022   GERD (gastroesophageal reflux disease) 10/15/2018   Personal history of breast cancer 12/05/2017   Osteoporosis 07/14/2014   Von Willebrand disease (HCC) 09/11/2013   Hypercholesteremia    Ductal carcinoma in situ (DCIS) of left breast    Dyslipidemia 09/03/2011   Type 1 von Willebrand disease (HCC) 09/03/2011   Malignant neoplasm of breast (HCC) 04/03/2011   Radiculopathy 11/08/2010   Fibromyalgia 11/08/2010    Allergies:  Allergies  Allergen Reactions   Aspirin Other (See Comments)    Pt has Von Willebrands Disease   Hydrocodone Itching   Oxycodone Itching   Tape Dermatitis    Pulls off skin   Medications:  Current Outpatient Medications:    cephALEXin (KEFLEX) 500 MG capsule, Take 1 capsule (500 mg total) by mouth 2 (two) times daily., Disp: 14 capsule, Rfl: 0    phenazopyridine (PYRIDIUM) 100 MG tablet, Take 1 tablet (100 mg total) by mouth 3 (three) times daily as needed for pain., Disp: 10 tablet, Rfl: 0   acetaminophen (TYLENOL) 500 MG tablet, Take 1,000 mg by mouth daily as needed for headache., Disp: , Rfl:    ALPRAZolam (XANAX) 0.5 MG tablet, TAKE 1 TABLET(0.5 MG) BY MOUTH AT BEDTIME AS NEEDED, Disp: 60 tablet, Rfl: 2   Azelastine-Fluticasone (DYMISTA) 137-50 MCG/ACT SUSP, Place 1 puff into the nose 2 (two) times daily., Disp: 23 g, Rfl: 11   benzonatate (TESSALON) 100 MG capsule, Take 1 capsule (100 mg total) by mouth 3 (three) times daily as needed for cough. Do not take with alcohol or while driving or operating heavy machinery.  May cause drowsiness., Disp: 21 capsule, Rfl: 0   chlorpheniramine (ALLERGY RELIEF) 4 MG tablet, , Disp: , Rfl:    famotidine (PEPCID) 20 MG tablet, One at least an hour before bedtime, Disp: 30 tablet, Rfl: 11   montelukast (SINGULAIR) 10 MG tablet, TAKE 1 TABLET AT BEDTIME, Disp: 90 tablet, Rfl: 2   Multiple Vitamin (MULTIVITAMIN WITH MINERALS) TABS tablet, Take 1 tablet by mouth daily., Disp: , Rfl:    ondansetron (ZOFRAN) 4 MG tablet, TAKE 1 TABLET(4 MG) BY MOUTH EVERY 8 HOURS AS NEEDED FOR NAUSEA OR VOMITING, Disp: 20 tablet, Rfl: 0   pantoprazole (PROTONIX) 40 MG tablet, Take 1 tablet (40 mg total) by mouth 2 (two) times daily., Disp: 180 tablet, Rfl: 2   Plecanatide (TRULANCE) 3 MG TABS, Take 1 tablet (3 mg total) by mouth daily., Disp: 90 tablet, Rfl: 3   rosuvastatin (CRESTOR) 20 MG tablet, Take 1 tablet (20 mg total) by mouth daily., Disp: 90 tablet, Rfl: 2   traMADol (ULTRAM) 50 MG tablet, Take 1 tablet (50 mg total) by mouth every 6 (six) hours as needed., Disp: 60 tablet, Rfl: 2  Observations/Objective: Patient is well-developed, well-nourished in no acute distress.  Resting comfortably at home.  Head is normocephalic, atraumatic.  No labored breathing.  Speech is clear and coherent with logical content.   Patient is alert and oriented at baseline.    Assessment and Plan: 1. Suspected UTI (Primary) - cephALEXin (KEFLEX) 500 MG capsule; Take 1 capsule (500 mg total) by mouth 2 (two) times daily.  Dispense: 14 capsule; Refill: 0 - phenazopyridine (PYRIDIUM) 100 MG tablet; Take 1 tablet (100 mg total) by mouth 3 (three) times daily as needed for pain.  Dispense: 10 tablet; Refill: 0  - Worsening symptoms.  - Will treat empirically with Keflex - May use Pyridium for bladder spasms - Continue to push fluids.  - Seek in person evaluation for urine culture if symptoms do not improve or if they worsen.    Follow Up Instructions: I discussed the assessment and treatment plan with the patient. The patient was provided an opportunity to ask questions and all were answered. The patient agreed with the plan and demonstrated an understanding of the instructions.  A copy of instructions were sent to the patient via MyChart unless otherwise noted below.    The patient was  advised to call back or seek an in-person evaluation if the symptoms worsen or if the condition fails to improve as anticipated.    Caroline Loveless, PA-C

## 2023-06-14 DIAGNOSIS — M25511 Pain in right shoulder: Secondary | ICD-10-CM | POA: Diagnosis not present

## 2023-06-17 NOTE — Progress Notes (Signed)
[facility] [address] [place] [address]   Clinic Day:  06/18/2023  Referring physician: Donita Brooks, MD  Patient Care Team: Caroline Brooks, MD as PCP - General (Family Medicine) Caroline Sjogren, MD as Consulting Physician (Plastic Surgery) Caroline Gauss Gerrit Friends, MD as Consulting Physician (Gastroenterology)   ASSESSMENT & PLAN:   Assessment:  1.  Clinical VW disease: - She carries a diagnosis of von Willebrand's disease.  She was reportedly seen by Dr. Kerry Valdez at [facility]. - History of major hemorrhage after tonsillectomy at age 76 requiring blood transfusion.  Also had hemorrhage after normal vaginal delivery to 12 days after second childbirth.  She had to have packing done after every time she had tooth pulled.  Also reported major bleeding after hysterectomy in 1996. - History of receiving DDAVP prior to most surgical procedures without any significant bleeding. - She denies any active bleeding.  Occasional bruising on legs.  She recently had 1 nosebleed while using Flonase. - She has been on aspirin 81 mg since 2014 after having minor stroke. - Normal factor IX, factor VIII activity, factor is 11 and 12 tested in May 2019.  2. Social/family History: -Lives independently. No tobacco use. -No family history of Von Willebrand's or other bleeding disorders. Sister and mother had breast cancer. Brother died of metastasized lung cancer due to agent orange exposure.   Plan:  1.  Clinical von Willebrand's disease: - She was referred by Dr. Shelle Valdez for optimization prior to planned right shoulder rotator cuff surgery. - Will check PT, PTT, VWD panel and fibrinogen today. - We will schedule a phone visit in 1 to 2 weeks. - She has last received DDAVP in 2020 prior to colonoscopy.  I would recommend DDAVP 0.3 mcg/kg given over 15-30 minutes, 30 minutes prior to anticipated procedure.   Orders Placed This Encounter  Procedures   Protime-INR     Standing Status:   Future    Number of Occurrences:   1    Expected Date:   06/18/2023    Expiration Date:   06/17/2024   APTT    Standing Status:   Future    Number of Occurrences:   1    Expected Date:   06/18/2023    Expiration Date:   06/17/2024   Von Willebrand panel    Standing Status:   Future    Number of Occurrences:   1    Expected Date:   06/18/2023    Expiration Date:   06/17/2024   Fibrinogen    Standing Status:   Future    Number of Occurrences:   1    Expected Date:   06/18/2023    Expiration Date:   06/17/2024      Caroline Valdez,acting as a scribe for Caroline Massed, MD.,have documented all relevant documentation on the behalf of Caroline Massed, MD,as directed by  Caroline Massed, MD while in the presence of Caroline Massed, MD.   I, Caroline Massed MD, have reviewed the above documentation for accuracy and completeness, and I agree with the above.   Caroline Massed, MD   1/28/20256:16 PM  CHIEF COMPLAINT/PURPOSE OF CONSULT:   Diagnosis: Clinical von Willebrand disease  Current Therapy: Under workup  HISTORY OF PRESENT ILLNESS:   Caroline Valdez is a 73 y.o. female presenting to clinic today for evaluation of Type 1 Von Willebrand's at the request of Dr. Shelle Valdez.  [name] was originally seen by me on 11/12/18 for  work-up and management of Von Willebrand's disease. Factor IX, factor VII activity, factor X activity, 11 and 12 were all normal when tested in May 2019. Von Willebrand panel from 11/13/18 showed an elevated Coag Factor VIII at 142. Fibrinogen, APTT, and Protime-INR were normal. She then had a colonoscopy on 01/22/19 which did not find any abnormalities or malignancies. She is being seen by me today for clearance prior to proceeding with right rotator cuff surgery. She has not yet been scheduled for surgery, as she needs authorization to proceed with me first.  Today, she states that she is doing well overall. Her appetite level is at 100%.  Her energy level is at 100%.  She denies any bleeding issues or surgeries other than having skin cancers removed since her last visit with me. She reports occasionally easily bruising, with intermittent bruises of unknown cause appearing on the legs. She reports one episode of epistaxis she attributes to taking Flonase.  She takes a baby ASA since 2014 due to having a TIA at that time. She denies any history of DVT's or PE's. She denies any fevers, night sweats, or unintentional weight loss.   When going thru menses, she reports heavy bleeding. She has a history of breast cancer in 2010. She has had genetic testing done. She did not have any immunotherapy.   PAST MEDICAL HISTORY:   Past Medical History: Past Medical History:  Diagnosis Date   Blood dyscrasia    Breast cancer (HCC)    left   Complication of anesthesia    1980S  TUBAL LIG, + HEMM SURGERY PT PASSED OUT AND WENT INTO SHOCK.  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    Endometriosis    Fibromyalgia    GERD (gastroesophageal reflux disease)    Hypercholesteremia    Mini stroke 09/2012   Radiculopathy    [facility])    MINI STROKE   Von Willebrand disease (HCC) 09/11/2013   Von Willebrand's disease [facility])    history of    Surgical History: Past Surgical History:  Procedure Laterality Date   ABDOMINAL HYSTERECTOMY     AUGMENTATION MAMMAPLASTY Right    BACK SURGERY     BREAST IMPLANT REMOVAL Right 12/05/2017   BREAST IMPLANT REMOVAL Right 12/05/2017   Procedure: REMOVAL RIGHT  BREAST IMPLANT MATERIAL;  Surgeon: Caroline Sjogren, MD;  Location: [facility] OR;  Service: Plastics;  Laterality: Right;   BREAST RECONSTRUCTION     BREAST RECONSTRUCTION Right 12/05/2017   Procedure: DELAYED BREAST RECONSTRUCTION WITH SILICONE IMPLANT;  Surgeon: Caroline Sjogren, MD;  Location: [facility] OR;  Service: Plastics;  Laterality: Right;   CARPAL TUNNEL RELEASE     right   COLONOSCOPY  01/2010   Dr. Franky Macho: normal    COLONOSCOPY WITH PROPOFOL N/A 01/22/2019   Procedure: COLONOSCOPY WITH PROPOFOL;  Surgeon: Corbin Ade, MD;  Location: [facility];  Service: Endoscopy;  Laterality: N/A;  2:30pm - can not come earlier due to transportation   FINGER SURGERY     LEFT THUMB   HEMORRHOID SURGERY     X2   LAMINECTOMY     MASTECTOMY     left   SHOULDER SURGERY     SKIN CANCER EXCISION     TONSILLECTOMY     TUBAL LIGATION      Social History: Social History   Socioeconomic History   Marital status: Widowed    Spouse name: Not on file   Number of  children: 1   Years of education: Not on file   Highest education level: 12th grade  Occupational History   Not on file  Tobacco Use   Smoking status: Never   Smokeless tobacco: Never  Vaping Use   Vaping status: Never Used  Substance and Sexual Activity   Alcohol use: No   Drug use: No   Sexual activity: Not on file    Comment: husband recently died from myelodysplastic syndrome in January of 2014  Other Topics Concern   Not on file  Social History Narrative   1 Daughter, April   1 grandson-20, 1 granddaughter-4.   Keeps granddaughter during the week.   Social Drivers of Corporate investment banker Strain: Low Risk  (05/03/2023)   Overall Financial Resource Strain (CARDIA)    Difficulty of Paying Living Expenses: Not hard at all  Food Insecurity: No Food Insecurity (06/18/2023)   Hunger Vital Sign    Worried About Running Out of Food in the Last Year: Never true    Ran Out of Food in the Last Year: Never true  Transportation Needs: No Transportation Needs (06/18/2023)   PRAPARE - Administrator, Civil Service (Medical): No    Lack of Transportation (Non-Medical): No  Physical Activity: Insufficiently Active (05/03/2023)   Exercise Vital Sign    Days of Exercise per Week: 2 days    Minutes of Exercise per Session: 30 min  Stress: No Stress Concern Present (05/03/2023)   [employer] of Occupational Health - Occupational  Stress Questionnaire    Feeling of Stress : Only a little  Social Connections: Unknown (05/03/2023)   Social Connection and Isolation Panel [NHANES]    Frequency of Communication with Valdez and Family: Patient declined    Frequency of Social Gatherings with Valdez and Family: Patient declined    Attends Religious Services: Patient declined    Database administrator or Organizations: Patient declined    Attends Banker Meetings: Not on file    Marital Status: Widowed  Intimate Partner Violence: Not At Risk (06/18/2023)   Humiliation, Afraid, Rape, and Kick questionnaire    Fear of Current or Ex-Partner: No    Emotionally Abused: No    Physically Abused: No    Sexually Abused: No    Family History: Family History  Problem Relation Age of Onset   Breast cancer Mother 79       now 10yo   Stroke Father        Father had no full sisters   Breast cancer Sister 20       now 69yo; had TAH/BSO   Lung cancer Brother        died 78yo; smoker    Current Medications:  Current Outpatient Medications:    acetaminophen (TYLENOL) 500 MG tablet, Take 1,000 mg by mouth daily as needed for headache., Disp: , Rfl:    ALPRAZolam (XANAX) 0.5 MG tablet, TAKE 1 TABLET(0.5 MG) BY MOUTH AT BEDTIME AS NEEDED, Disp: 60 tablet, Rfl: 2   Azelastine-Fluticasone (DYMISTA) 137-50 MCG/ACT SUSP, Place 1 puff into the nose 2 (two) times daily., Disp: 23 g, Rfl: 11   chlorpheniramine (ALLERGY RELIEF) 4 MG tablet, , Disp: , Rfl:    famotidine (PEPCID) 20 MG tablet, One at least an hour before bedtime, Disp: 30 tablet, Rfl: 11   montelukast (SINGULAIR) 10 MG tablet, TAKE 1 TABLET AT BEDTIME, Disp: 90 tablet, Rfl: 2   Multiple Vitamin (MULTIVITAMIN WITH MINERALS) TABS tablet,  Take 1 tablet by mouth daily., Disp: , Rfl:    pantoprazole (PROTONIX) 40 MG tablet, Take 1 tablet (40 mg total) by mouth 2 (two) times daily., Disp: 180 tablet, Rfl: 2   Plecanatide (TRULANCE) 3 MG TABS, Take 1 tablet (3 mg  total) by mouth daily., Disp: 90 tablet, Rfl: 3   rosuvastatin (CRESTOR) 20 MG tablet, Take 1 tablet (20 mg total) by mouth daily., Disp: 90 tablet, Rfl: 2   traMADol (ULTRAM) 50 MG tablet, Take 1 tablet (50 mg total) by mouth every 6 (six) hours as needed., Disp: 60 tablet, Rfl: 2   ondansetron (ZOFRAN) 4 MG tablet, TAKE 1 TABLET(4 MG) BY MOUTH EVERY 8 HOURS AS NEEDED FOR NAUSEA OR VOMITING (Patient not taking: Reported on 06/18/2023), Disp: 20 tablet, Rfl: 0   Allergies: Allergies  Allergen Reactions   Aspirin Other (See Comments)    Pt has Von Willebrands Disease   Hydrocodone Itching   Oxycodone Itching   Tape Dermatitis    Pulls off skin    REVIEW OF SYSTEMS:   Review of Systems  Constitutional:  Negative for chills, fatigue and fever.  HENT:   Negative for lump/mass, mouth sores, nosebleeds, sore throat and trouble swallowing.   Eyes:  Negative for eye problems.  Respiratory:  Positive for cough. Negative for shortness of breath.   Cardiovascular:  Negative for chest pain, leg swelling and palpitations.  Gastrointestinal:  Negative for abdominal pain, constipation, diarrhea, nausea and vomiting.  Genitourinary:  Negative for bladder incontinence, difficulty urinating, dysuria, frequency, hematuria and nocturia.   Musculoskeletal:  Negative for arthralgias, back pain, flank pain, myalgias and neck pain.       +right arm/shoulder pain, 7/10 severity  Skin:  Negative for itching and rash.  Neurological:  Positive for numbness (in right hand). Negative for dizziness and headaches.  Hematological:  Does not bruise/bleed easily.  Psychiatric/Behavioral:  Negative for depression, sleep disturbance and suicidal ideas. The patient is not nervous/anxious.   All other systems reviewed and are negative.    VITALS:   Blood pressure (!) 152/83, pulse 90, temperature 98.2 F (36.8 C), temperature source Oral, resp. rate 20, height 5\' 5"  (1.651 m), weight 132 lb 0.9 oz (59.9 kg), SpO2 94%.   Wt Readings from Last 3 Encounters:  06/18/23 132 lb 0.9 oz (59.9 kg)  05/23/23 133 lb (60.3 kg)  05/07/23 136 lb 3.2 oz (61.8 kg)    Body mass index is 21.98 kg/m.   PHYSICAL EXAM:   Physical Exam Vitals and nursing note reviewed. Exam conducted with a chaperone present.  Constitutional:      Appearance: Normal appearance.  Cardiovascular:     Rate and Rhythm: Normal rate and regular rhythm.     Pulses: Normal pulses.     Heart sounds: Normal heart sounds.  Pulmonary:     Effort: Pulmonary effort is normal.     Breath sounds: Normal breath sounds.  Abdominal:     Palpations: Abdomen is soft. There is no hepatomegaly, splenomegaly or mass.     Tenderness: There is no abdominal tenderness.  Musculoskeletal:     Right lower leg: No edema.     Left lower leg: No edema.  Lymphadenopathy:     Cervical: No cervical adenopathy.     Right cervical: No superficial, deep or posterior cervical adenopathy.    Left cervical: No superficial, deep or posterior cervical adenopathy.     Upper Body:     Right upper body: No supraclavicular or  axillary adenopathy.     Left upper body: No supraclavicular or axillary adenopathy.  Skin:    Findings: No ecchymosis.  Neurological:     General: No focal deficit present.     Mental Status: She is alert and oriented to person, place, and time.  Psychiatric:        Mood and Affect: Mood normal.        Behavior: Behavior normal.     LABS:   CBC    Component Value Date/Time   WBC 4.6 03/20/2023 1510   RBC 4.51 03/20/2023 1510   HGB 13.9 03/20/2023 1510   HGB 13.6 12/28/2022 1038   HCT 42.5 03/20/2023 1510   HCT 40.2 12/28/2022 1038   PLT 208 03/20/2023 1510   PLT 191 12/28/2022 1038   MCV 94.2 03/20/2023 1510   MCV 92 12/28/2022 1038   MCH 30.8 03/20/2023 1510   MCHC 32.7 03/20/2023 1510   RDW 12.8 03/20/2023 1510   RDW 12.5 12/28/2022 1038   LYMPHSABS 1.5 12/28/2022 1038   MONOABS 0.6 11/12/2018 1430   EOSABS 41 03/20/2023  1510   EOSABS 0.1 12/28/2022 1038   BASOSABS 32 03/20/2023 1510   BASOSABS 0.0 12/28/2022 1038    CMP    Component Value Date/Time   NA 142 03/20/2023 1510   K 4.0 03/20/2023 1510   CL 103 03/20/2023 1510   CO2 31 03/20/2023 1510   GLUCOSE 93 03/20/2023 1510   BUN 16 03/20/2023 1510   CREATININE 0.85 03/20/2023 1510   CALCIUM 9.3 03/20/2023 1510   PROT 6.6 03/20/2023 1510   ALBUMIN 4.3 12/23/2021 1322   AST 26 03/20/2023 1510   ALT 20 03/20/2023 1510   ALKPHOS 39 12/23/2021 1322   BILITOT 0.6 03/20/2023 1510   GFRNONAA >60 12/23/2021 1322   GFRNONAA 63 08/26/2020 0846   GFRAA 73 08/26/2020 0846    No results found for: "CEA1", "CEA" / No results found for: "CEA1", "CEA" No results found for: "PSA1" No results found for: "ZOX096" No results found for: "CAN125"  No results found for: "TOTALPROTELP", "ALBUMINELP", "A1GS", "A2GS", "BETS", "BETA2SER", "GAMS", "MSPIKE", "SPEI" No results found for: "TIBC", "FERRITIN", "IRONPCTSAT" No results found for: "LDH"   STUDIES:   MR Shoulder Right Wo Contrast Result Date: 06/01/2023 CLINICAL DATA:  Chronic right shoulder pain EXAM: MRI OF THE RIGHT SHOULDER WITHOUT CONTRAST TECHNIQUE: Multiplanar, multisequence MR imaging of the shoulder was performed. No intravenous contrast was administered. COMPARISON:  None Available. FINDINGS: Rotator cuff: Partial-thickness articular surface tear of the supraspinatus tendon measuring 8 mm AP, 10 mm medial-lateral encompassing greater insert 75% of the tendon thickness. Mild infraspinatus tendinosis. Teres minor tendon is intact. Subscapularis tendon is intact. Muscles: No muscle atrophy or edema. No intramuscular fluid collection or hematoma. Biceps Long Head: Intraarticular and extraarticular portions of the biceps tendon are intact. Acromioclavicular Joint: Mild arthropathy of the acromioclavicular joint. Trace subacromial/subdeltoid bursal fluid. Glenohumeral Joint: Small joint effusion. Mild  partial-thickness cartilage loss of the glenohumeral joint. Labrum: Grossly intact, but evaluation is limited by lack of intraarticular fluid/contrast. Bones: No fracture or dislocation. No aggressive osseous lesion. Subcortical cystic changes in the humeral head at the infraspinatus insertion. Other: No fluid collection or hematoma. IMPRESSION: 1. Partial-thickness articular surface tear of the supraspinatus tendon measuring 8 mm AP, 10 mm medial-lateral encompassing greater than 75% of the tendon thickness. 2. Mild infraspinatus tendinosis. Electronically Signed   By: Elige Ko M.D.   On: 06/01/2023 08:01   US Venous Img Lower  Unilateral Right Result Date: 05/29/2023 CLINICAL DATA:  Right calf pain EXAM: RIGHT LOWER EXTREMITY VENOUS DOPPLER ULTRASOUND TECHNIQUE: Gray-scale sonography with graded compression, as well as color Doppler and duplex ultrasound were performed to evaluate the lower extremity deep venous systems from the level of the common femoral vein and including the common femoral, femoral, profunda femoral, popliteal and calf veins including the posterior tibial, peroneal and gastrocnemius veins when visible. The superficial great saphenous vein was also interrogated. Spectral Doppler was utilized to evaluate flow at rest and with distal augmentation maneuvers in the common femoral, femoral and popliteal veins. COMPARISON:  None Available. FINDINGS: Contralateral Common Femoral Vein: Respiratory phasicity is normal and symmetric with the symptomatic side. No evidence of thrombus. Normal compressibility. Common Femoral Vein: No evidence of thrombus. Normal compressibility, respiratory phasicity and response to augmentation. Saphenofemoral Junction: No evidence of thrombus. Normal compressibility and flow on color Doppler imaging. Profunda Femoral Vein: No evidence of thrombus. Normal compressibility and flow on color Doppler imaging. Femoral Vein: No evidence of thrombus. Normal compressibility,  respiratory phasicity and response to augmentation. Popliteal Vein: No evidence of thrombus. Normal compressibility, respiratory phasicity and response to augmentation. Calf Veins: No evidence of thrombus. Normal compressibility and flow on color Doppler imaging. Superficial Great Saphenous Vein: No evidence of thrombus. Normal compressibility. Venous Reflux:  None. Other Findings:  None. IMPRESSION: No evidence of deep venous thrombosis. Electronically Signed   By: Alcide Clever M.D.   On: 05/29/2023 23:55

## 2023-06-18 ENCOUNTER — Inpatient Hospital Stay: Payer: Medicare Other | Attending: Hematology | Admitting: Hematology

## 2023-06-18 ENCOUNTER — Encounter: Payer: Self-pay | Admitting: Hematology

## 2023-06-18 ENCOUNTER — Inpatient Hospital Stay: Payer: Medicare Other

## 2023-06-18 VITALS — BP 152/83 | HR 90 | Temp 98.2°F | Resp 20 | Ht 65.0 in | Wt 132.1 lb

## 2023-06-18 DIAGNOSIS — E7800 Pure hypercholesterolemia, unspecified: Secondary | ICD-10-CM | POA: Diagnosis not present

## 2023-06-18 DIAGNOSIS — Z801 Family history of malignant neoplasm of trachea, bronchus and lung: Secondary | ICD-10-CM | POA: Diagnosis not present

## 2023-06-18 DIAGNOSIS — Z803 Family history of malignant neoplasm of breast: Secondary | ICD-10-CM | POA: Insufficient documentation

## 2023-06-18 DIAGNOSIS — Z8673 Personal history of transient ischemic attack (TIA), and cerebral infarction without residual deficits: Secondary | ICD-10-CM | POA: Diagnosis not present

## 2023-06-18 DIAGNOSIS — K219 Gastro-esophageal reflux disease without esophagitis: Secondary | ICD-10-CM | POA: Insufficient documentation

## 2023-06-18 DIAGNOSIS — M2540 Effusion, unspecified joint: Secondary | ICD-10-CM | POA: Diagnosis not present

## 2023-06-18 DIAGNOSIS — M797 Fibromyalgia: Secondary | ICD-10-CM | POA: Insufficient documentation

## 2023-06-18 DIAGNOSIS — D6801 Von willebrand disease, type 1: Secondary | ICD-10-CM

## 2023-06-18 DIAGNOSIS — Z79899 Other long term (current) drug therapy: Secondary | ICD-10-CM | POA: Diagnosis not present

## 2023-06-18 DIAGNOSIS — D6800 Von Willebrand disease, unspecified: Secondary | ICD-10-CM | POA: Diagnosis not present

## 2023-06-18 LAB — PROTIME-INR
INR: 1 (ref 0.8–1.2)
Prothrombin Time: 13.6 s (ref 11.4–15.2)

## 2023-06-18 LAB — FIBRINOGEN: Fibrinogen: 504 mg/dL — ABNORMAL HIGH (ref 210–475)

## 2023-06-18 LAB — APTT: aPTT: 28 s (ref 24–36)

## 2023-06-18 NOTE — Patient Instructions (Signed)
You were seen and examined today by Dr. Ellin Saba. Dr. Ellin Saba is a hematologist, meaning that he specializes in blood abnormalities. Dr. Ellin Saba discussed your past medical history, family history of cancers/blood conditions and the events that led to you being here today.  You were referred to Dr. Ellin Saba due to history of Von Willebrand.  Dr. Ellin Saba has recommended additional labs today for further evaluation.  Follow-up as scheduled.

## 2023-06-19 LAB — VON WILLEBRAND PANEL
Coagulation Factor VIII: 170 % — ABNORMAL HIGH (ref 56–140)
Ristocetin Co-factor, Plasma: 167 % (ref 50–200)
Von Willebrand Antigen, Plasma: 191 % (ref 50–200)

## 2023-06-19 LAB — COAG STUDIES INTERP REPORT

## 2023-06-20 ENCOUNTER — Encounter: Payer: Self-pay | Admitting: Family Medicine

## 2023-06-23 ENCOUNTER — Other Ambulatory Visit: Payer: Self-pay

## 2023-06-23 ENCOUNTER — Ambulatory Visit
Admission: EM | Admit: 2023-06-23 | Discharge: 2023-06-23 | Disposition: A | Discharge status: Home / Self Care | Payer: Medicare Other | Attending: Nurse Practitioner | Admitting: Nurse Practitioner

## 2023-06-23 ENCOUNTER — Encounter: Payer: Self-pay | Admitting: *Deleted

## 2023-06-23 DIAGNOSIS — N3001 Acute cystitis with hematuria: Secondary | ICD-10-CM | POA: Insufficient documentation

## 2023-06-23 LAB — POCT URINALYSIS DIP (MANUAL ENTRY)
Glucose, UA: 100 mg/dL — AB
Nitrite, UA: POSITIVE — AB
Protein Ur, POC: >=300 mg/dL — AB
Spec Grav, UA: 1.02
Urobilinogen, UA: 4 U/dL — AB
pH, UA: 5

## 2023-06-23 MED ORDER — SULFAMETHOXAZOLE-TRIMETHOPRIM 800-160 MG PO TABS: 1.0000 | ORAL_TABLET | Freq: Two times a day (BID) | ORAL | 0 refills | Status: DC

## 2023-06-23 MED ORDER — PHENAZOPYRIDINE HCL 100 MG PO TABS
100.0000 mg | ORAL_TABLET | Freq: Three times a day (TID) | ORAL | 0 refills | Status: DC | PRN
Start: 2023-06-23 — End: 2023-07-03

## 2023-06-23 NOTE — Discharge Instructions (Signed)
-  A urine culture is pending. You will be contacted if the results of the culture indicate the medication prescribed today does not cover the bacteria causing your symptoms/ -Take medications as prescribed. -Increase fluids. Make sure you are drinking at least 8 to 10 8 ounce glasses of water daily/ -Tylenol for pain, fever, or general discomfort. -Develop a toileting schedule that will allow you to toilet at least every 2 hours. -Avoid caffeine to include tea, soda, and coffee. -If sexually active, void at least 15 to 20 minutes after sexual intercourse. -I would like for you to follow-up with your PCP within the next 3 to 5 days for reevaluation. -Follow-up in the emergency department if you develop fever, chills, worsening urinary symptoms. worsening abdominal pain, or other concerns.  -Follow-up as needed.

## 2023-06-23 NOTE — ED Triage Notes (Signed)
PT reports dysuria and UTI sx's this morning with blood in urine. Pt was treated recently for a UTII.

## 2023-06-23 NOTE — ED Provider Notes (Signed)
[facility]    CSN: [id number] Arrival date & time: 06/23/23  1259      History   Chief Complaint Chief Complaint  Patient presents with   UTI   Dysuria    HPI Caroline Valdez is a 73 y.o. female.   The history is provided by the patient.   Patient presents for complaints of hematuria, pain with urination, and lower pelvic pressure that is been present since this morning.  She denies fever, chills, chest pain, abdominal pain, flank pain, low back pain, decreased urine stream, urgency, hesitancy, or vaginal symptoms.  Patient states that she was treated for UTI within the past 2 weeks.  Patient was prescribed Keflex twice daily at that time.  Patient states that she is passing clots with her urine, states that she was doing the same when her symptoms initially started on 06/10/2023.  Patient denies lightheadedness, dizziness, or use of blood thinners.  She also denies history of kidney stones, pyelonephritis, and recurrent UTIs.  Past Medical History:  Diagnosis Date   Blood dyscrasia    Breast cancer (HCC)    left   Complication of anesthesia    1980S  TUBAL LIG, + HEMM SURGERY PT PASSED OUT AND WENT INTO SHOCK.  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    Endometriosis    Fibromyalgia    GERD (gastroesophageal reflux disease)    Hypercholesteremia    Mini stroke 09/2012   Radiculopathy    Stroke [facility])    MINI STROKE   Von Willebrand disease (HCC) 09/11/2013   Von Willebrand's disease [facility])    history of    Patient Active Problem List   Diagnosis Date Noted   Right calf pain 05/23/2023   Acute non-recurrent frontal sinusitis 01/23/2023   Upper airway cough syndrome 12/28/2022   GERD (gastroesophageal reflux disease) 10/15/2018   Personal history of breast cancer 12/05/2017   Osteoporosis 07/14/2014   Von Willebrand disease (HCC) 09/11/2013   Hypercholesteremia    Ductal carcinoma in situ (DCIS) of left breast    Dyslipidemia  09/03/2011   Type 1 von Willebrand disease (HCC) 09/03/2011   Malignant neoplasm of breast (HCC) 04/03/2011   Radiculopathy 11/08/2010   Fibromyalgia 11/08/2010    Past Surgical History:  Procedure Laterality Date   ABDOMINAL HYSTERECTOMY     AUGMENTATION MAMMAPLASTY Right    BACK SURGERY     BREAST IMPLANT REMOVAL Right 12/05/2017   BREAST IMPLANT REMOVAL Right 12/05/2017   Procedure: REMOVAL RIGHT  BREAST IMPLANT MATERIAL;  Surgeon: Etter Sjogren, MD;  Location: [facility] OR;  Service: Plastics;  Laterality: Right;   BREAST RECONSTRUCTION     BREAST RECONSTRUCTION Right 12/05/2017   Procedure: DELAYED BREAST RECONSTRUCTION WITH SILICONE IMPLANT;  Surgeon: Etter Sjogren, MD;  Location: [facility] OR;  Service: Plastics;  Laterality: Right;   CARPAL TUNNEL RELEASE     right   COLONOSCOPY  01/2010   Dr. Franky Macho: normal   COLONOSCOPY WITH PROPOFOL N/A 01/22/2019   Procedure: COLONOSCOPY WITH PROPOFOL;  Surgeon: Corbin Ade, MD;  [facility];  Service: Endoscopy;  Laterality: N/A;  2:30pm - can not come earlier due to transportation   FINGER SURGERY     LEFT THUMB   HEMORRHOID SURGERY     X2   LAMINECTOMY     MASTECTOMY     left   SHOULDER SURGERY     SKIN CANCER EXCISION     TONSILLECTOMY  TUBAL LIGATION      OB History   No obstetric history on file.      Home Medications    Prior to Admission medications   Medication Sig Start Date End Date Taking? Authorizing Provider  acetaminophen (TYLENOL) 500 MG tablet Take 1,000 mg by mouth daily as needed for headache.   Yes [provider]  ALPRAZolam (XANAX) 0.5 MG tablet TAKE 1 TABLET(0.5 MG) BY MOUTH AT BEDTIME AS NEEDED 11/15/22  Yes Donita Brooks, MD  Azelastine-Fluticasone (DYMISTA) 137-50 MCG/ACT SUSP Place 1 puff into the nose 2 (two) times daily. 01/31/23  Yes Nyoka Cowden, MD  chlorpheniramine (ALLERGY RELIEF) 4 MG tablet  12/27/22  Yes [provider]  famotidine (PEPCID) 20 MG tablet One  at least an hour before bedtime 12/28/22  Yes Nyoka Cowden, MD  montelukast (SINGULAIR) 10 MG tablet TAKE 1 TABLET AT BEDTIME 04/11/23  Yes Donita Brooks, MD  Multiple Vitamin (MULTIVITAMIN WITH MINERALS) TABS tablet Take 1 tablet by mouth daily.   Yes [provider]  ondansetron (ZOFRAN) 4 MG tablet TAKE 1 TABLET(4 MG) BY MOUTH EVERY 8 HOURS AS NEEDED FOR NAUSEA OR VOMITING 05/24/22  Yes Donita Brooks, MD  pantoprazole (PROTONIX) 40 MG tablet Take 1 tablet (40 mg total) by mouth 2 (two) times daily. 04/11/23  Yes Donita Brooks, MD  phenazopyridine (PYRIDIUM) 100 MG tablet Take 1 tablet (100 mg total) by mouth 3 (three) times daily as needed for pain. 06/23/23  Yes Leath-Warren, Sadie Haber, NP  Plecanatide (TRULANCE) 3 MG TABS Take 1 tablet (3 mg total) by mouth daily. 03/15/23  Yes Mahon, Frederik Schmidt, NP  rosuvastatin (CRESTOR) 20 MG tablet Take 1 tablet (20 mg total) by mouth daily. 04/11/23  Yes Donita Brooks, MD  sulfamethoxazole-trimethoprim (BACTRIM DS) 800-160 MG tablet Take 1 tablet by mouth 2 (two) times daily for 10 days. 06/23/23 07/03/23 Yes Leath-Warren, Sadie Haber, NP  traMADol (ULTRAM) 50 MG tablet Take 1 tablet (50 mg total) by mouth every 6 (six) hours as needed. 03/15/23  Yes Donita Brooks, MD    Family History Family History  Problem Relation Age of Onset   Breast cancer Mother 62       now 66yo   Stroke Father        Father had no full sisters   Breast cancer Sister 66       now 10yo; had TAH/BSO   Lung cancer Brother        died 71yo; smoker    Social History Social History   Tobacco Use   Smoking status: Never   Smokeless tobacco: Never  Vaping Use   Vaping status: Never Used  Substance Use Topics   Alcohol use: No   Drug use: No     Allergies   Aspirin, Hydrocodone, Oxycodone, and Tape   Review of Systems Review of Systems Per HPI  Physical Exam Triage Vital Signs ED Triage Vitals  Encounter Vitals Group     BP 06/23/23  1420 125/83     Systolic BP Percentile --      Diastolic BP Percentile --      Pulse Rate 06/23/23 1420 (!) 103     Resp 06/23/23 1420 18     Temp 06/23/23 1420 98.6 F (37 C)     Temp src --      SpO2 06/23/23 1420 96 %     Weight --      Height --  Head Circumference --      Peak Flow --      Pain Score 06/23/23 1417 6     Pain Loc --      Pain Education --      Exclude from Growth Chart --    No data found.  Updated Vital Signs BP 125/83   Pulse (!) 103   Temp 98.6 F (37 C)   Resp 18   SpO2 96%   Visual Acuity Right Eye Distance:   Left Eye Distance:   Bilateral Distance:    Right Eye Near:   Left Eye Near:    Bilateral Near:     Physical Exam Vitals and nursing note reviewed.  Constitutional:      General: She is not in acute distress.    Appearance: Normal appearance.  HENT:     Head: Normocephalic.  Eyes:     Extraocular Movements: Extraocular movements intact.     Pupils: Pupils are equal, round, and reactive to light.  Cardiovascular:     Rate and Rhythm: Regular rhythm.     Pulses: Normal pulses.     Heart sounds: Normal heart sounds.  Pulmonary:     Effort: Pulmonary effort is normal.     Breath sounds: Normal breath sounds.  Abdominal:     General: Abdomen is flat. Bowel sounds are normal.     Palpations: Abdomen is soft.     Tenderness: There is abdominal tenderness in the suprapubic area. There is no right CVA tenderness or left CVA tenderness.  Musculoskeletal:     Cervical back: Normal range of motion.  Lymphadenopathy:     Cervical: No cervical adenopathy.  Skin:    General: Skin is warm and dry.  Neurological:     General: No focal deficit present.     Mental Status: She is alert and oriented to person, place, and time.  Psychiatric:        Mood and Affect: Mood normal.        Behavior: Behavior normal.      UC Treatments / Results  Labs (all labs ordered are listed, but only abnormal results are displayed) Labs  Reviewed  POCT URINALYSIS DIP (MANUAL ENTRY) - Abnormal; Notable for the following components:      Result Value   Color, UA red (*)    Clarity, UA cloudy (*)    Glucose, UA =100 (*)    Bilirubin, UA large (*)    Ketones, POC UA small (15) (*)    Blood, UA large (*)    Protein Ur, POC >=300 (*)    Urobilinogen, UA 4.0 (*)    Nitrite, UA Positive (*)    Leukocytes, UA Large (3+) (*)    All other components within normal limits  URINE CULTURE    EKG   Radiology No results found.  Procedures Procedures (including critical care time)  Medications Ordered in UC Medications - No data to display  Initial Impression / Assessment and Plan / UC Course  I have reviewed the triage vital signs and the nursing notes.  Pertinent labs & imaging results that were available during my care of the patient were reviewed by me and considered in my medical decision making (see chart for details).  On exam, lung sounds are clear throughout, room air sats at 96%.  Urinalysis shows a urinary tract infection with hematuria.  Urine culture is pending.  Will treat with Bactrim DS 800/160 mg tablets twice daily for the next  10 days.  Pyridium 100 mg prescribed for dysuria.  Patient with moderate hematuria noted, patient was advised to follow-up with her PCP within the next 3 to 5 days for reevaluation, or sooner if symptoms worsen.  Patient was also given strict ER follow-up precautions.  Patient was in agreement with this plan of care and verbalizes understanding.  All questions were answered.  Patient stable for discharge.  Final Clinical Impressions(s) / UC Diagnoses   Final diagnoses:  Acute cystitis with hematuria     Discharge Instructions      -A urine culture is pending. You will be contacted if the results of the culture indicate the medication prescribed today does not cover the bacteria causing your symptoms/ -Take medications as prescribed. -Increase fluids. Make sure you are drinking  at least 8 to 10 8 ounce glasses of water daily/ -Tylenol for pain, fever, or general discomfort. -Develop a toileting schedule that will allow you to toilet at least every 2 hours. -Avoid caffeine to include tea, soda, and coffee. -If sexually active, void at least 15 to 20 minutes after sexual intercourse. -I would like for you to follow-up with your PCP within the next 3 to 5 days for reevaluation. -Follow-up in the emergency department if you develop fever, chills, worsening urinary symptoms. worsening abdominal pain, or other concerns.  -Follow-up as needed.       ED Prescriptions     Medication Sig Dispense Auth. Provider   sulfamethoxazole-trimethoprim (BACTRIM DS) 800-160 MG tablet Take 1 tablet by mouth 2 (two) times daily for 10 days. 20 tablet Leath-Warren, Sadie Haber, NP   phenazopyridine (PYRIDIUM) 100 MG tablet Take 1 tablet (100 mg total) by mouth 3 (three) times daily as needed for pain. 10 tablet Leath-Warren, Sadie Haber, NP      PDMP not reviewed this encounter.   Abran Cantor, NP 06/23/23 1447

## 2023-06-24 ENCOUNTER — Ambulatory Visit (INDEPENDENT_AMBULATORY_CARE_PROVIDER_SITE_OTHER): Payer: Medicare Other | Admitting: Family Medicine

## 2023-06-24 ENCOUNTER — Encounter: Payer: Self-pay | Admitting: Family Medicine

## 2023-06-24 VITALS — BP 126/82 | HR 111 | Temp 97.7°F | Ht 65.0 in | Wt 131.6 lb

## 2023-06-24 DIAGNOSIS — R3989 Other symptoms and signs involving the genitourinary system: Secondary | ICD-10-CM

## 2023-06-24 LAB — URINALYSIS, ROUTINE W REFLEX MICROSCOPIC
Bilirubin Urine: NEGATIVE
Ketones, ur: NEGATIVE
Leukocytes,Ua: NEGATIVE
Nitrite: POSITIVE — AB
Specific Gravity, Urine: 1.01 (ref 1.001–1.035)
pH: 5 (ref 5.0–8.0)

## 2023-06-24 LAB — MICROSCOPIC MESSAGE

## 2023-06-24 NOTE — Progress Notes (Signed)
Subjective:    Patient ID: Caroline Valdez, female    DOB: 1950/09/28, 73 y.o.   MRN: [id number] Patient recently went to an urgent care for urinary tract infection.  She was treated about 2 weeks ago with a video visit.  Her doctor gave her Keflex.  The symptoms abated somewhat however after she finished the antibiotics, the extreme dysuria returned.  She also with his gross hematuria.  Urinalysis yesterday at the urgent care was grossly abnormal with positive leukocyte esterase, positive blood, positive nitrates.  Patient is currently on Azo.  Therefore urinalysis obtained today is difficult to interpret however leukocyte esterase has disappeared.  She continues to have nitrates and blood.  Quantity was insufficient for microscopy.  She is currently on Bactrim for 10 days prescribed by the urgent care.  A urine culture is still pending Past Medical History:  Diagnosis Date   Blood dyscrasia    Breast cancer (HCC)    left   Complication of anesthesia    1980S  TUBAL LIG, + HEMM SURGERY PT PASSED OUT AND WENT INTO SHOCK.  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    Endometriosis    Fibromyalgia    GERD (gastroesophageal reflux disease)    Hypercholesteremia    Mini stroke 09/2012   Radiculopathy    Stroke [facility])    MINI STROKE   Von Willebrand disease (HCC) 09/11/2013   Von Willebrand's disease [facility])    history of   Past Surgical History:  Procedure Laterality Date   ABDOMINAL HYSTERECTOMY     AUGMENTATION MAMMAPLASTY Right    BACK SURGERY     BREAST IMPLANT REMOVAL Right 12/05/2017   BREAST IMPLANT REMOVAL Right 12/05/2017   Procedure: REMOVAL RIGHT  BREAST IMPLANT MATERIAL;  Surgeon: Etter Sjogren, MD;  Location: [facility] OR;  Service: Plastics;  Laterality: Right;   BREAST RECONSTRUCTION     BREAST RECONSTRUCTION Right 12/05/2017   Procedure: DELAYED BREAST RECONSTRUCTION WITH SILICONE IMPLANT;  Surgeon: Etter Sjogren, MD;  Location: [facility] OR;  Service: Plastics;   Laterality: Right;   CARPAL TUNNEL RELEASE     right   COLONOSCOPY  01/2010   Dr. Franky Macho: normal   COLONOSCOPY WITH PROPOFOL N/A 01/22/2019   Procedure: COLONOSCOPY WITH PROPOFOL;  Surgeon: Corbin Ade, MD;  [facility];  Service: Endoscopy;  Laterality: N/A;  2:30pm - can not come earlier due to transportation   FINGER SURGERY     LEFT THUMB   HEMORRHOID SURGERY     X2   LAMINECTOMY     MASTECTOMY     left   SHOULDER SURGERY     SKIN CANCER EXCISION     TONSILLECTOMY     TUBAL LIGATION     Current Outpatient Medications on File Prior to Visit  Medication Sig Dispense Refill   acetaminophen (TYLENOL) 500 MG tablet Take 1,000 mg by mouth daily as needed for headache.     ALPRAZolam (XANAX) 0.5 MG tablet TAKE 1 TABLET(0.5 MG) BY MOUTH AT BEDTIME AS NEEDED 60 tablet 2   Azelastine-Fluticasone (DYMISTA) 137-50 MCG/ACT SUSP Place 1 puff into the nose 2 (two) times daily. 23 g 11   chlorpheniramine (ALLERGY RELIEF) 4 MG tablet      famotidine (PEPCID) 20 MG tablet One at least an hour before bedtime 30 tablet 11   montelukast (SINGULAIR) 10 MG tablet TAKE 1 TABLET AT BEDTIME 90 tablet 2   Multiple Vitamin (MULTIVITAMIN WITH  MINERALS) TABS tablet Take 1 tablet by mouth daily.     ondansetron (ZOFRAN) 4 MG tablet TAKE 1 TABLET(4 MG) BY MOUTH EVERY 8 HOURS AS NEEDED FOR NAUSEA OR VOMITING 20 tablet 0   pantoprazole (PROTONIX) 40 MG tablet Take 1 tablet (40 mg total) by mouth 2 (two) times daily. 180 tablet 2   phenazopyridine (PYRIDIUM) 100 MG tablet Take 1 tablet (100 mg total) by mouth 3 (three) times daily as needed for pain. 10 tablet 0   Plecanatide (TRULANCE) 3 MG TABS Take 1 tablet (3 mg total) by mouth daily. 90 tablet 3   rosuvastatin (CRESTOR) 20 MG tablet Take 1 tablet (20 mg total) by mouth daily. 90 tablet 2   sulfamethoxazole-trimethoprim (BACTRIM DS) 800-160 MG tablet Take 1 tablet by mouth 2 (two) times daily for 10 days. 20 tablet 0   traMADol (ULTRAM) 50  MG tablet Take 1 tablet (50 mg total) by mouth every 6 (six) hours as needed. 60 tablet 2   No current facility-administered medications on file prior to visit.   Allergies  Allergen Reactions   Aspirin Other (See Comments)    Pt has Von Willebrands Disease   Hydrocodone Itching   Oxycodone Itching   Tape Dermatitis    Pulls off skin   Social History   Socioeconomic History   Marital status: Widowed    Spouse name: Not on file   Number of children: 1   Years of education: Not on file   Highest education level: 12th grade  Occupational History   Not on file  Tobacco Use   Smoking status: Never   Smokeless tobacco: Never  Vaping Use   Vaping status: Never Used  Substance and Sexual Activity   Alcohol use: No   Drug use: No   Sexual activity: Not on file    Comment: husband recently died from myelodysplastic syndrome in January of 2014  Other Topics Concern   Not on file  Social History Narrative   1 Daughter, April   1 grandson-20, 1 granddaughter-4.   Keeps granddaughter during the week.   Social Drivers of Corporate investment banker Strain: Low Risk  (05/03/2023)   Overall Financial Resource Strain (CARDIA)    Difficulty of Paying Living Expenses: Not hard at all  Food Insecurity: No Food Insecurity (06/18/2023)   Hunger Vital Sign    Worried About Running Out of Food in the Last Year: Never true    Ran Out of Food in the Last Year: Never true  Transportation Needs: No Transportation Needs (06/18/2023)   PRAPARE - Administrator, Civil Service (Medical): No    Lack of Transportation (Non-Medical): No  Physical Activity: Insufficiently Active (05/03/2023)   Exercise Vital Sign    Days of Exercise per Week: 2 days    Minutes of Exercise per Session: 30 min  Stress: No Stress Concern Present (05/03/2023)   [employer] of Occupational Health - Occupational Stress Questionnaire    Feeling of Stress : Only a little  Social Connections: Unknown  (05/03/2023)   Social Connection and Isolation Panel [NHANES]    Frequency of Communication with Friends and Family: Patient declined    Frequency of Social Gatherings with Friends and Family: Patient declined    Attends Religious Services: Patient declined    Database administrator or Organizations: Patient declined    Attends Banker Meetings: Not on file    Marital Status: Widowed  Intimate Partner Violence: Not At  Risk (06/18/2023)   Humiliation, Afraid, Rape, and Kick questionnaire    Fear of Current or Ex-Partner: No    Emotionally Abused: No    Physically Abused: No    Sexually Abused: No   Family History  Problem Relation Age of Onset   Breast cancer Mother 70       now 54yo   Stroke Father        Father had no full sisters   Breast cancer Sister 13       now 76yo; had TAH/BSO   Lung cancer Brother        died 61yo; smoker     Review of Systems  All other systems reviewed and are negative.      Objective:   Physical Exam Vitals reviewed.  Constitutional:      General: She is not in acute distress.    Appearance: She is well-developed. She is not diaphoretic.  HENT:     Head: Normocephalic and atraumatic.     Right Ear: Tympanic membrane and ear canal normal.     Left Ear: Tympanic membrane and ear canal normal.     Nose:     Right Turbinates: Not enlarged.     Left Turbinates: Not enlarged.     Right Sinus: No maxillary sinus tenderness or frontal sinus tenderness.     Left Sinus: No maxillary sinus tenderness or frontal sinus tenderness.  Eyes:     Conjunctiva/sclera: Conjunctivae normal.  Cardiovascular:     Rate and Rhythm: Normal rate and regular rhythm.     Heart sounds: Normal heart sounds. No murmur heard.    No friction rub. No gallop.  Pulmonary:     Effort: Pulmonary effort is normal. No respiratory distress.     Breath sounds: Normal breath sounds. No wheezing or rales.  Chest:     Chest wall: No tenderness.            Assessment & Plan:  Suspected UTI - Plan: Urinalysis, Routine w reflex microscopic Await urine culture.  If urine culture confirms urinary tract infection, await sensitivities.  If the patient has a confirmed UTI and is treated with the appropriate antibiotics, I would repeat a urinalysis in 2 weeks.  If UTI is resolved and hematuria stops, no further workup is necessary.  If the patient has a recurrent UTI that does not respond to the appropriate antibiotic or if hematuria persists despite appropriate antibiotic, I would recommend urology consult for cystoscopy

## 2023-06-25 LAB — URINE CULTURE: Culture: >=100000 — AB

## 2023-07-02 NOTE — Progress Notes (Signed)
[facility] [address], [place] [address]    Clinic Day:  07/02/2023  Referring physician: Donita Brooks, MD  Patient Care Team: Donita Brooks, MD as PCP - General (Family Medicine) Etter Sjogren, MD as Consulting Physician (Plastic Surgery) Jena Gauss Gerrit Friends, MD as Consulting Physician (Gastroenterology)   ASSESSMENT & PLAN:   Assessment: 1.  Clinical VW disease: - She carries a diagnosis of von Willebrand's disease.  She was reportedly seen by Dr. Kerry Dory at [facility]. - History of major hemorrhage after tonsillectomy at age 4 requiring blood transfusion.  Also had hemorrhage after normal vaginal delivery to 12 days after second childbirth.  She had to have packing done after every time she had tooth pulled.  Also reported major bleeding after hysterectomy in 1996. - History of receiving DDAVP prior to most surgical procedures without any significant bleeding. - She denies any active bleeding.  Occasional bruising on legs.  She recently had 1 nosebleed while using Flonase. - She has been on aspirin 81 mg since 2014 after having minor stroke. - Normal factor IX, factor VIII activity, factor is 11 and 12 tested in May 2019.   2. Social/family History: -Lives independently. No tobacco use. -No family history of Von Willebrand's or other bleeding disorders. Sister and mother had breast cancer. Brother died of metastasized lung cancer due to agent orange exposure.     Plan: 1.  Clinical von Willebrand's disease: - She was referred by Dr. Shelle Iron for optimization prior to planned right shoulder rotator cuff surgery. - Will check PT, PTT, VWD panel and fibrinogen today. - We will schedule a phone visit in 1 to 2 weeks. - She has last received DDAVP in 2020 prior to colonoscopy.  I would recommend DDAVP 0.3 mcg/kg given over 15-30 minutes, 30 minutes prior to anticipated procedure.    No orders of the defined types were placed in this  encounter.     I,Katie Daubenspeck,acting as a Neurosurgeon for Doreatha Massed, MD.,have documented all relevant documentation on the behalf of Doreatha Massed, MD,as directed by  Doreatha Massed, MD while in the presence of Doreatha Massed, MD.   ***  Mickie Bail   2/11/20254:14 PM  CHIEF COMPLAINT:   Diagnosis: Clinical von Willebrand disease   Cancer Staging  No matching staging information was found for the patient.    Prior Therapy: none  Current Therapy:  ***   HISTORY OF PRESENT ILLNESS:   Oncology History  Ductal carcinoma in situ (DCIS) of left breast  04/2009 Initial Diagnosis   Left breast DCIS with microinvasion status post left mastectomy and reconstruction together with right breast reduction and implant, ER positive PR negative, 2 cm size, Tis NX stage 0; patient did not want antiestrogen therapy      INTERVAL HISTORY:   Caroline Valdez is a 73 y.o. female presenting to clinic today for follow up of Clinical von Willebrand disease. She was last seen by me on 06/18/23 in consultation.  Today, she states that she is doing well overall. Her appetite level is at ***%. Her energy level is at ***%.  PAST MEDICAL HISTORY:   Past Medical History: Past Medical History:  Diagnosis Date   Blood dyscrasia    Breast cancer (HCC)    left   Complication of anesthesia    1980S  TUBAL LIG, + HEMM SURGERY PT PASSED OUT AND WENT INTO SHOCK.  NO PROBLEMS SINCE    Defective Cl/HCO3 exchange in ileum  and colon    Endometriosis    Fibromyalgia    GERD (gastroesophageal reflux disease)    Hypercholesteremia    Mini stroke 09/2012   Radiculopathy    Stroke [facility])    MINI STROKE   Von Willebrand disease (HCC) 09/11/2013   Von Willebrand's disease [facility])    history of    Surgical History: Past Surgical History:  Procedure Laterality Date   ABDOMINAL HYSTERECTOMY     AUGMENTATION MAMMAPLASTY Right    BACK SURGERY     BREAST IMPLANT REMOVAL Right  12/05/2017   BREAST IMPLANT REMOVAL Right 12/05/2017   Procedure: REMOVAL RIGHT  BREAST IMPLANT MATERIAL;  Surgeon: Etter Sjogren, MD;  Location: [facility] OR;  Service: Plastics;  Laterality: Right;   BREAST RECONSTRUCTION     BREAST RECONSTRUCTION Right 12/05/2017   Procedure: DELAYED BREAST RECONSTRUCTION WITH SILICONE IMPLANT;  Surgeon: Etter Sjogren, MD;  Location: [facility] OR;  Service: Plastics;  Laterality: Right;   CARPAL TUNNEL RELEASE     right   COLONOSCOPY  01/2010   Dr. Franky Macho: normal   COLONOSCOPY WITH PROPOFOL N/A 01/22/2019   Procedure: COLONOSCOPY WITH PROPOFOL;  Surgeon: Corbin Ade, MD;  [facility];  Service: Endoscopy;  Laterality: N/A;  2:30pm - can not come earlier due to transportation   FINGER SURGERY     LEFT THUMB   HEMORRHOID SURGERY     X2   LAMINECTOMY     MASTECTOMY     left   SHOULDER SURGERY     SKIN CANCER EXCISION     TONSILLECTOMY     TUBAL LIGATION      Social History: Social History   Socioeconomic History   Marital status: Widowed    Spouse name: Not on file   Number of children: 1   Years of education: Not on file   Highest education level: 12th grade  Occupational History   Not on file  Tobacco Use   Smoking status: Never   Smokeless tobacco: Never  Vaping Use   Vaping status: Never Used  Substance and Sexual Activity   Alcohol use: No   Drug use: No   Sexual activity: Not on file    Comment: husband recently died from myelodysplastic syndrome in January of 2014  Other Topics Concern   Not on file  Social History Narrative   1 Daughter, April   1 grandson-20, 1 granddaughter-4.   Keeps granddaughter during the week.   Social Drivers of Corporate investment banker Strain: Low Risk  (05/03/2023)   Overall Financial Resource Strain (CARDIA)    Difficulty of Paying Living Expenses: Not hard at all  Food Insecurity: No Food Insecurity (06/18/2023)   Hunger Vital Sign    Worried About Running Out of Food in the Last  Year: Never true    Ran Out of Food in the Last Year: Never true  Transportation Needs: No Transportation Needs (06/18/2023)   PRAPARE - Administrator, Civil Service (Medical): No    Lack of Transportation (Non-Medical): No  Physical Activity: Insufficiently Active (05/03/2023)   Exercise Vital Sign    Days of Exercise per Week: 2 days    Minutes of Exercise per Session: 30 min  Stress: No Stress Concern Present (05/03/2023)   [employer] of Occupational Health - Occupational Stress Questionnaire    Feeling of Stress : Only a little  Social Connections: Unknown (05/03/2023)   Social Connection and Isolation Panel [NHANES]  Frequency of Communication with Friends and Family: Patient declined    Frequency of Social Gatherings with Friends and Family: Patient declined    Attends Religious Services: Patient declined    Database administrator or Organizations: Patient declined    Attends Banker Meetings: Not on file    Marital Status: Widowed  Intimate Partner Violence: Not At Risk (06/18/2023)   Humiliation, Afraid, Rape, and Kick questionnaire    Fear of Current or Ex-Partner: No    Emotionally Abused: No    Physically Abused: No    Sexually Abused: No    Family History: Family History  Problem Relation Age of Onset   Breast cancer Mother 78       now 73yo   Stroke Father        Father had no full sisters   Breast cancer Sister 91       now 33yo; had TAH/BSO   Lung cancer Brother        died 38yo; smoker    Current Medications:  Current Outpatient Medications:    acetaminophen (TYLENOL) 500 MG tablet, Take 1,000 mg by mouth daily as needed for headache., Disp: , Rfl:    ALPRAZolam (XANAX) 0.5 MG tablet, TAKE 1 TABLET(0.5 MG) BY MOUTH AT BEDTIME AS NEEDED, Disp: 60 tablet, Rfl: 2   Azelastine-Fluticasone (DYMISTA) 137-50 MCG/ACT SUSP, Place 1 puff into the nose 2 (two) times daily., Disp: 23 g, Rfl: 11   chlorpheniramine (ALLERGY RELIEF) 4  MG tablet, , Disp: , Rfl:    famotidine (PEPCID) 20 MG tablet, One at least an hour before bedtime, Disp: 30 tablet, Rfl: 11   montelukast (SINGULAIR) 10 MG tablet, TAKE 1 TABLET AT BEDTIME, Disp: 90 tablet, Rfl: 2   Multiple Vitamin (MULTIVITAMIN WITH MINERALS) TABS tablet, Take 1 tablet by mouth daily., Disp: , Rfl:    ondansetron (ZOFRAN) 4 MG tablet, TAKE 1 TABLET(4 MG) BY MOUTH EVERY 8 HOURS AS NEEDED FOR NAUSEA OR VOMITING, Disp: 20 tablet, Rfl: 0   pantoprazole (PROTONIX) 40 MG tablet, Take 1 tablet (40 mg total) by mouth 2 (two) times daily., Disp: 180 tablet, Rfl: 2   phenazopyridine (PYRIDIUM) 100 MG tablet, Take 1 tablet (100 mg total) by mouth 3 (three) times daily as needed for pain., Disp: 10 tablet, Rfl: 0   Plecanatide (TRULANCE) 3 MG TABS, Take 1 tablet (3 mg total) by mouth daily., Disp: 90 tablet, Rfl: 3   rosuvastatin (CRESTOR) 20 MG tablet, Take 1 tablet (20 mg total) by mouth daily., Disp: 90 tablet, Rfl: 2   sulfamethoxazole-trimethoprim (BACTRIM DS) 800-160 MG tablet, Take 1 tablet by mouth 2 (two) times daily for 10 days., Disp: 20 tablet, Rfl: 0   traMADol (ULTRAM) 50 MG tablet, Take 1 tablet (50 mg total) by mouth every 6 (six) hours as needed., Disp: 60 tablet, Rfl: 2   Allergies: Allergies  Allergen Reactions   Aspirin Other (See Comments)    Pt has Von Willebrands Disease   Hydrocodone Itching   Oxycodone Itching   Tape Dermatitis    Pulls off skin    REVIEW OF SYSTEMS:   Review of Systems  Constitutional:  Negative for chills, fatigue and fever.  HENT:   Negative for lump/mass, mouth sores, nosebleeds, sore throat and trouble swallowing.   Eyes:  Negative for eye problems.  Respiratory:  Negative for cough and shortness of breath.   Cardiovascular:  Negative for chest pain, leg swelling and palpitations.  Gastrointestinal:  Negative for  abdominal pain, constipation, diarrhea, nausea and vomiting.  Genitourinary:  Negative for bladder incontinence,  difficulty urinating, dysuria, frequency, hematuria and nocturia.   Musculoskeletal:  Negative for arthralgias, back pain, flank pain, myalgias and neck pain.  Skin:  Negative for itching and rash.  Neurological:  Negative for dizziness, headaches and numbness.  Hematological:  Does not bruise/bleed easily.  Psychiatric/Behavioral:  Negative for depression, sleep disturbance and suicidal ideas. The patient is not nervous/anxious.   All other systems reviewed and are negative.    VITALS:   There were no vitals taken for this visit.  Wt Readings from Last 3 Encounters:  06/24/23 131 lb 9.6 oz (59.7 kg)  06/18/23 132 lb 0.9 oz (59.9 kg)  05/23/23 133 lb (60.3 kg)    There is no height or weight on file to calculate BMI.  Performance status (ECOG): {CHL ONC [id number]  PHYSICAL EXAM:   Physical Exam Vitals and nursing note reviewed. Exam conducted with a chaperone present.  Constitutional:      Appearance: Normal appearance.  Cardiovascular:     Rate and Rhythm: Normal rate and regular rhythm.     Pulses: Normal pulses.     Heart sounds: Normal heart sounds.  Pulmonary:     Effort: Pulmonary effort is normal.     Breath sounds: Normal breath sounds.  Abdominal:     Palpations: Abdomen is soft. There is no hepatomegaly, splenomegaly or mass.     Tenderness: There is no abdominal tenderness.  Musculoskeletal:     Right lower leg: No edema.     Left lower leg: No edema.  Lymphadenopathy:     Cervical: No cervical adenopathy.     Right cervical: No superficial, deep or posterior cervical adenopathy.    Left cervical: No superficial, deep or posterior cervical adenopathy.     Upper Body:     Right upper body: No supraclavicular or axillary adenopathy.     Left upper body: No supraclavicular or axillary adenopathy.  Neurological:     General: No focal deficit present.     Mental Status: She is alert and oriented to person, place, and time.  Psychiatric:        Mood and  Affect: Mood normal.        Behavior: Behavior normal.     LABS:   CBC     Component Value Date/Time   WBC 4.6 03/20/2023 1510   RBC 4.51 03/20/2023 1510   HGB 13.9 03/20/2023 1510   HGB 13.6 12/28/2022 1038   HCT 42.5 03/20/2023 1510   HCT 40.2 12/28/2022 1038   PLT 208 03/20/2023 1510   PLT 191 12/28/2022 1038   MCV 94.2 03/20/2023 1510   MCV 92 12/28/2022 1038   MCH 30.8 03/20/2023 1510   MCHC 32.7 03/20/2023 1510   RDW 12.8 03/20/2023 1510   RDW 12.5 12/28/2022 1038   LYMPHSABS 1.5 12/28/2022 1038   MONOABS 0.6 11/12/2018 1430   EOSABS 41 03/20/2023 1510   EOSABS 0.1 12/28/2022 1038   BASOSABS 32 03/20/2023 1510   BASOSABS 0.0 12/28/2022 1038    CMP      Component Value Date/Time   NA 142 03/20/2023 1510   K 4.0 03/20/2023 1510   CL 103 03/20/2023 1510   CO2 31 03/20/2023 1510   GLUCOSE 93 03/20/2023 1510   BUN 16 03/20/2023 1510   CREATININE 0.85 03/20/2023 1510   CALCIUM 9.3 03/20/2023 1510   PROT 6.6 03/20/2023 1510   ALBUMIN 4.3 12/23/2021 1322  AST 26 03/20/2023 1510   ALT 20 03/20/2023 1510   ALKPHOS 39 12/23/2021 1322   BILITOT 0.6 03/20/2023 1510   GFRNONAA >60 12/23/2021 1322   GFRNONAA 63 08/26/2020 0846   GFRAA 73 08/26/2020 0846     No results found for: "CEA1", "CEA" / No results found for: "CEA1", "CEA" No results found for: "PSA1" No results found for: "ZOX096" No results found for: "CAN125"  No results found for: "TOTALPROTELP", "ALBUMINELP", "A1GS", "A2GS", "BETS", "BETA2SER", "GAMS", "MSPIKE", "SPEI" No results found for: "TIBC", "FERRITIN", "IRONPCTSAT" No results found for: "LDH"   STUDIES:   No results found.

## 2023-07-03 ENCOUNTER — Inpatient Hospital Stay: Payer: Medicare Other | Attending: Hematology | Admitting: Hematology

## 2023-07-03 VITALS — BP 132/82 | HR 103 | Temp 97.7°F | Resp 19 | Ht 65.0 in | Wt 131.4 lb

## 2023-07-03 DIAGNOSIS — K219 Gastro-esophageal reflux disease without esophagitis: Secondary | ICD-10-CM | POA: Insufficient documentation

## 2023-07-03 DIAGNOSIS — Z801 Family history of malignant neoplasm of trachea, bronchus and lung: Secondary | ICD-10-CM | POA: Diagnosis not present

## 2023-07-03 DIAGNOSIS — Z8673 Personal history of transient ischemic attack (TIA), and cerebral infarction without residual deficits: Secondary | ICD-10-CM | POA: Diagnosis not present

## 2023-07-03 DIAGNOSIS — M797 Fibromyalgia: Secondary | ICD-10-CM | POA: Diagnosis not present

## 2023-07-03 DIAGNOSIS — Z803 Family history of malignant neoplasm of breast: Secondary | ICD-10-CM | POA: Insufficient documentation

## 2023-07-03 DIAGNOSIS — D0512 Intraductal carcinoma in situ of left breast: Secondary | ICD-10-CM | POA: Insufficient documentation

## 2023-07-03 DIAGNOSIS — D6800 Von Willebrand disease, unspecified: Secondary | ICD-10-CM | POA: Diagnosis not present

## 2023-07-03 DIAGNOSIS — E7800 Pure hypercholesterolemia, unspecified: Secondary | ICD-10-CM | POA: Insufficient documentation

## 2023-07-03 DIAGNOSIS — Z87440 Personal history of urinary (tract) infections: Secondary | ICD-10-CM | POA: Diagnosis not present

## 2023-07-03 DIAGNOSIS — Z79899 Other long term (current) drug therapy: Secondary | ICD-10-CM | POA: Insufficient documentation

## 2023-07-03 DIAGNOSIS — Z853 Personal history of malignant neoplasm of breast: Secondary | ICD-10-CM | POA: Diagnosis not present

## 2023-07-03 DIAGNOSIS — Z170 Estrogen receptor positive status [ER+]: Secondary | ICD-10-CM | POA: Diagnosis not present

## 2023-07-03 NOTE — Patient Instructions (Addendum)
[facility] at [facility] Discharge Instructions   You were seen and examined today by Dr. Ellin Saba.  He reviewed the results of your lab work which are normal/stable.   Dr. Kirtland Bouchard has sent clearance for surgery to Dr. Shelle Iron.   Return as needed.    Thank you for choosing [facility] at [facility] to provide your oncology and hematology care.  To afford each patient quality time with our provider, please arrive at least 15 minutes before your scheduled appointment time.   If you have a lab appointment with the Cancer Center please come in thru the Main Entrance and check in at the main information desk.  You need to re-schedule your appointment should you arrive 10 or more minutes late.  We strive to give you quality time with our providers, and arriving late affects you and other patients whose appointments are after yours.  Also, if you no show three or more times for appointments you may be dismissed from the clinic at the providers discretion.     Again, thank you for choosing [facility].  Our hope is that these requests will decrease the amount of time that you wait before being seen by our physicians.       _____________________________________________________________  Should you have questions after your visit to [facility], please contact our office at [phone] and follow the prompts.  Our office hours are 8:00 a.m. and 4:30 p.m. Monday - Friday.  Please note that voicemails left after 4:00 p.m. may not be returned until the following business day.  We are closed weekends and major holidays.  You do have access to a nurse 24-7, just call the main number to the clinic [phone] and do not press any options, hold on the line and a nurse will answer the phone.    For prescription refill requests, have your pharmacy contact our office and allow 72 hours.    Due to Covid, you will need to wear a mask upon  entering the hospital. If you do not have a mask, a mask will be given to you at the Main Entrance upon arrival. For doctor visits, patients may have 1 support person age 52 or older with them. For treatment visits, patients can not have anyone with them due to social distancing guidelines and our immunocompromised population.

## 2023-07-04 ENCOUNTER — Other Ambulatory Visit: Payer: Self-pay | Admitting: Family Medicine

## 2023-07-04 ENCOUNTER — Ambulatory Visit: Payer: Self-pay | Admitting: Orthopedic Surgery

## 2023-07-04 DIAGNOSIS — Z0000 Encounter for general adult medical examination without abnormal findings: Secondary | ICD-10-CM

## 2023-07-04 DIAGNOSIS — F4321 Adjustment disorder with depressed mood: Secondary | ICD-10-CM

## 2023-07-04 NOTE — H&P (Signed)
Caroline Valdez is an 73 y.o. female.   Chief Complaint: R shoulder pain HPI: Hand Dominance: right Location: right; shoulder Duration: >3 months Quality: aching; no change Severity: moderate; pain level 7/10 Context: felt an onset of pain after reaching back in the car to hand her granddaughter her backpack Alleviating Factors: rest; limited weight bearing; narcotics (tramadol - PCP) Aggravating Factors: lifting; ROM; weightbearing Associated Symptoms: numbness Previous Surgery: date: (2008 - right shoulder scope in [place] - unsure of surgeon or practice) Prior Imaging: MRI [facility]) Previous Injections: did not help; right shoulder cortisone injection with PCP x1 month ago - no relief Previous PT: none She was seen by Dr. Tanya Nones she had an MRI of her shoulder which demonstrated over 70% tear of the rotator cuff. She has had a history of rotator cuff repair over 10 years ago. Also has a history of von Willebrand's disease. She has had an injection which did not help.  Past Medical History:  Diagnosis Date   Blood dyscrasia    Breast cancer (HCC)    left   Complication of anesthesia    1980S  TUBAL LIG, + HEMM SURGERY PT PASSED OUT AND WENT INTO SHOCK.  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    Endometriosis    Fibromyalgia    GERD (gastroesophageal reflux disease)    Hypercholesteremia    Mini stroke 09/2012   Radiculopathy    Stroke [facility])    MINI STROKE   Von Willebrand disease (HCC) 09/11/2013   Von Willebrand's disease [facility])    history of    Past Surgical History:  Procedure Laterality Date   ABDOMINAL HYSTERECTOMY     AUGMENTATION MAMMAPLASTY Right    BACK SURGERY     BREAST IMPLANT REMOVAL Right 12/05/2017   BREAST IMPLANT REMOVAL Right 12/05/2017   Procedure: REMOVAL RIGHT  BREAST IMPLANT MATERIAL;  Surgeon: Etter Sjogren, MD;  Location: [facility] OR;  Service: Plastics;  Laterality: Right;   BREAST RECONSTRUCTION      BREAST RECONSTRUCTION Right 12/05/2017   Procedure: DELAYED BREAST RECONSTRUCTION WITH SILICONE IMPLANT;  Surgeon: Etter Sjogren, MD;  Location: [facility] OR;  Service: Plastics;  Laterality: Right;   CARPAL TUNNEL RELEASE     right   COLONOSCOPY  01/2010   Dr. Franky Macho: normal   COLONOSCOPY WITH PROPOFOL N/A 01/22/2019   Procedure: COLONOSCOPY WITH PROPOFOL;  Surgeon: Corbin Ade, MD;  [facility];  Service: Endoscopy;  Laterality: N/A;  2:30pm - can not come earlier due to transportation   FINGER SURGERY     LEFT THUMB   HEMORRHOID SURGERY     X2   LAMINECTOMY     MASTECTOMY     left   SHOULDER SURGERY     SKIN CANCER EXCISION     TONSILLECTOMY     TUBAL LIGATION      Family History  Problem Relation Age of Onset   Breast cancer Mother 42       now 18yo   Stroke Father        Father had no full sisters   Breast cancer Sister 24       now 65yo; had TAH/BSO   Lung cancer Brother        died 6yo; smoker   Social History:  reports that she has never smoked. She has never used smokeless tobacco. She reports that she does not drink alcohol and does not use drugs.  Allergies:  Allergies  Allergen Reactions   Aspirin Other (See Comments)    Pt has Von Willebrands Disease   Hydrocodone Itching   Oxycodone Itching   Tape Dermatitis    Pulls off skin   Current meds: acetaminophen 1,000 mg-DM 30 mg oral powder packet Adult Low Dose Aspirin 81 mg tablet,delayed release ALPRAZolam 0.5 mg tablet azelastine-fluticasone chlorpheniramine 2 mg-DM 10 mg-acetaminophen 325 mg tablet montelukast 10 mg tablet ondansetron 4 mg disintegrating tablet One Daily Multivitamins with Minerals 4.5 mg iron tablet pantoprazole 40 mg tablet,delayed release Pepcid 20 mg tablet rosuvastatin 20 mg tablet traMADoL 50 mg tablet Trulance 3 mg tablet Vision Formula (with lutein)  Review of Systems  Constitutional: Negative.   HENT: Negative.    Eyes: Negative.   Respiratory:  Negative.    Cardiovascular: Negative.   Gastrointestinal: Negative.   Endocrine: Negative.   Genitourinary: Negative.   Musculoskeletal:  Positive for arthralgias, joint swelling and myalgias.  Skin: Negative.   Neurological: Negative.   Psychiatric/Behavioral: Negative.      There were no vitals taken for this visit. Physical Exam Constitutional:      Appearance: Normal appearance.  HENT:     Head: Normocephalic and atraumatic.     Right Ear: External ear normal.     Left Ear: External ear normal.     Nose: Nose normal.     Mouth/Throat:     Pharynx: Oropharynx is clear.  Eyes:     Conjunctiva/sclera: Conjunctivae normal.  Cardiovascular:     Rate and Rhythm: Normal rate and regular rhythm.     Pulses: Normal pulses.     Heart sounds: Normal heart sounds.  Pulmonary:     Effort: Pulmonary effort is normal.     Breath sounds: Normal breath sounds.  Abdominal:     General: Bowel sounds are normal.  Musculoskeletal:     Cervical back: Normal range of motion.     Comments: Constitutional: General Appearance: healthy-appearing and NAD.  Psychiatric: Mood and Affect: normal mood and affect.  Cardiovascular System: Arterial Pulses Right: radial normal and brachial normal. Varicosities Right: no varicosities.  C-Spine/Neck: Active Range of Motion: flexion normal, extension normal, and no pain elicited on motion.  Shoulders: Inspection Right: no misalignment, atrophy, erythema, swelling, or scapular winging. Bony Palpation Right: no tenderness of the sternoclavicular joint, the coracoid process, the acromioclavicular joint, the bicipital groove, or the scapula. Soft Tissue Palpation Right: tenderness of the supraspinatus and the subacromial bursa. Active Range of Motion Right: limited. Special Tests Right: Speed's test negative and Neer's test positive. Stability Right: no laxity, sulcus sign negative, and anterior apprehension test negative. Strength Right: abduction 5/5,  adduction 5/5, flexion 5/5, and extension 5/5.  Skin: Right Upper Extremity: normal.  Neurological System: Biceps Reflex Right: normal (2). Brachioradialis Reflex Right: normal (2). Triceps Reflex Right: normal (2). Sensation on the Right: C5 normal, C6 normal, and C7 normal.  We can external rotation tender in the anterior subacromial region nontender to the [facility]  Skin:    General: Skin is warm and dry.  Neurological:     Mental Status: She is alert.    Three-view x-rays of the shoulder demonstrate a previous distal clavicle resection the lateral sloping acromion  Outside MRI demonstrates a 70 to 80% high-grade tear of the supraspinatus. Previous distal clavicle resection.  Assessment/Plan Impression:  1. Refractory symptomatic high-grade recurrent tear of the supraspinatus 2. History of rotator cuff repair distal clavicle resection 3. Von Willebrand's disease  Plan:  We discussed options given her persistent symptoms despite rest activity modification cortisone injection we discussed revision repair with likely use of the patch graft  An extensive discussion concerning the pathology relevant anatomy and treatment options. After that discussion we mutually agreed to proceed with repair of the rotator cuff utilizing arthroscopic assistance if possible. The risks and benefits of that procedure were discussed including bleeding, infection, suboptimal range of motion, deep venous thrombosis, pulmonary embolism, anesthetic complications etc. in addition we discussed the postoperative course to include approximately 4 weeks of passive range of motion followed by 4 weeks of active range of motion followed by 4-12 weeks of progressive strengthening exercises. In addition we discussed protective activities to reduce the risk of a reinjury including impingement activities with elbow above the shoulder as well as reaching and repetitive circular motion activities. The hospital stay will either be as a  outpatient with a regional block versus overnight depending upon the extent of the procedure and any challenging health issues with a first postoperative visit 2 weeks following the surgery.   No history of DVT or MRSA. Able to take aspirin. She does have von Willebrand's. In the past she has had to take IV desmopressin 30 minutes preoperatively to boost the levels of the bone but Willebrand's factor. Will obtain clearances from her medical doctor and hopefully from her hematologist.  Appreciate the kind referral by Dr. Tanya Nones. We discussed utilizing a patch graft  Plan Revision mini-open R RCR with patch graft  Dorothy Spark, PA-C for Dr Shelle Iron 07/04/2023, 8:39 AM

## 2023-07-05 NOTE — Patient Instructions (Signed)
SURGICAL WAITING ROOM VISITATION Patients having surgery or a procedure may have no more than 2 support people in the waiting area - these visitors may rotate in the visitor waiting room.   Due to an increase in RSV and influenza rates and associated hospitalizations, children ages 18 and under may not visit patients in [facility] hospitals. If the patient needs to stay at the hospital during part of their recovery, the visitor guidelines for inpatient rooms apply.  PRE-OP VISITATION  Pre-op nurse will coordinate an appropriate time for 1 support person to accompany the patient in pre-op.  This support person may not rotate.  This visitor will be contacted when the time is appropriate for the visitor to come back in the pre-op area.  Please refer to the [facility] website for the visitor guidelines for Inpatients (after your surgery is over and you are in a regular room).  You are not required to quarantine at this time prior to your surgery. However, you must do this: Hand Hygiene often Do NOT share personal items Notify your provider if you are in close contact with someone who has COVID or you develop fever 100.4 or greater, new onset of sneezing, cough, sore throat, shortness of breath or body aches.  If you test positive for Covid or have been in contact with anyone that has tested positive in the last 10 days please notify you surgeon.    Your procedure is scheduled on:  Wednesday  July 10, 2023  Report to [facility] Main Entrance: [facility]sen entrance where the [employer] is available.   Report to admitting at:  06:15   AM  Call this number if you have any questions or problems the morning of surgery [phone]  FOLLOW ANY ADDITIONAL PRE OP INSTRUCTIONS YOU RECEIVED FROM YOUR SURGEON'S OFFICE!!!  Do not eat food after Midnight the night prior to your surgery/procedure.  After Midnight you may have the following liquids until  05:30 AM  DAY OF SURGERY  Clear  Liquid Diet Water Black Coffee (sugar ok, NO MILK/CREAM OR CREAMERS)  Tea (sugar ok, NO MILK/CREAM OR CREAMERS) regular and decaf                             Plain Jell-O  with no fruit (NO RED)                                           Fruit ices (not with fruit pulp, NO RED)                                     Popsicles (NO RED)                                                                  Juice: NO CITRUS JUICES: only apple, WHITE grape, WHITE cranberry Sports drinks like Gatorade or Powerade (NO RED)                   The day of surgery:  Drink ONE (1) Pre-Surgery Clear Ensure at   05:30 AM the morning of surgery. Drink in one sitting. Do not sip.  This drink was given to you during your hospital pre-op appointment visit. Nothing else to drink after completing the Pre-Surgery Clear Ensure : No candy, chewing gum or throat lozenges.     Oral Hygiene is also important to reduce your risk of infection.        Remember - BRUSH YOUR TEETH THE MORNING OF SURGERY WITH YOUR REGULAR TOOTHPASTE  Do NOT smoke after Midnight the night before surgery.  STOP TAKING all Vitamins, Herbs and supplements 1 week before your surgery.   Take ONLY these medicines the morning of surgery with A SIP OF WATER: pantoprazole, and you may take EITHER Tylenol OR Tramadol if needed for pain.  You may use your nasal spray if needed.   You may not have any metal on your body including hair pins, jewelry, and body piercing  Do not wear make-up, lotions, powders, perfumes  or deodorant  Do not wear nail polish including gel and S&S, artificial / acrylic nails, or any other type of covering on natural nails including finger and toenails. If you have artificial nails, gel coating, etc., that needs to be removed by a nail salon, Please have this removed prior to surgery. Not doing so may mean that your surgery could be cancelled or delayed if the Surgeon or anesthesia staff feels like they are unable to monitor you  safely.   Do not shave 48 hours prior to surgery to avoid nicks in your skin which may contribute to postoperative infections.   Contacts, Hearing Aids, dentures or bridgework may not be worn into surgery. DENTURES WILL BE REMOVED PRIOR TO SURGERY PLEASE DO NOT APPLY "Poly grip" OR ADHESIVES!!!  Patients discharged on the day of surgery will not be allowed to drive home.  Someone NEEDS to stay with you for the first 24 hours after anesthesia.  Do not bring your home medications to the hospital. The Pharmacy will dispense medications listed on your medication list to you during your admission in the Hospital.  Please read over the following fact sheets you were given: IF YOU HAVE QUESTIONS ABOUT YOUR PRE-OP INSTRUCTIONS, PLEASE CALL [phone]   [facility] - Preparing for Surgery Before surgery, you can play an important role.  Because skin is not sterile, your skin needs to be as free of germs as possible.  You can reduce the number of germs on your skin by washing with CHG (chlorahexidine gluconate) soap before surgery.  CHG is an antiseptic cleaner which kills germs and bonds with the skin to continue killing germs even after washing. Please DO NOT use if you have an allergy to CHG or antibacterial soaps.  If your skin becomes reddened/irritated stop using the CHG and inform your nurse when you arrive at Short Stay. Do not shave (including legs and underarms) for at least 48 hours prior to the first CHG shower.  You may shave your face/neck.  Please follow these instructions carefully:  1.  Shower with CHG Soap the night before surgery and the  morning of surgery.  2.  If you choose to wash your hair, wash your hair first as usual with your normal  shampoo.  3.  After you shampoo, rinse your hair and body thoroughly to remove the shampoo.  4.  Use CHG as you would any other liquid soap.  You can apply chg directly to the skin and wash.  Gently with a scrungie or  clean washcloth.  5.  Apply the CHG Soap to your body ONLY FROM THE NECK DOWN.   Do not use on face/ open                           Wound or open sores. Avoid contact with eyes, ears mouth and genitals (private parts).                       Wash face,  Genitals (private parts) with your normal soap.             6.  Wash thoroughly, paying special attention to the area where your  surgery  will be performed.  7.  Thoroughly rinse your body with warm water from the neck down.  8.  DO NOT shower/wash with your normal soap after using and rinsing off the CHG Soap.            9.  Pat yourself dry with a clean towel.            10.  Wear clean pajamas.            11.  Place clean sheets on your bed the night of your first shower and do not  sleep with pets.  ON THE DAY OF SURGERY : Do not apply any lotions/deodorants the morning of surgery.  Please wear clean clothes to the hospital/surgery center.     FAILURE TO FOLLOW THESE INSTRUCTIONS MAY RESULT IN THE CANCELLATION OF YOUR SURGERY  PATIENT SIGNATURE_________________________________  NURSE SIGNATURE__________________________________  ________________________________________________________________________       Caroline Valdez    An incentive spirometer is a tool that can help keep your lungs clear and active. This tool measures how well you are filling your lungs with each breath. Taking long deep breaths may help reverse or decrease the chance of developing breathing (pulmonary) problems (especially infection) following: A long period of time when you are unable to move or be active. BEFORE THE PROCEDURE  If the spirometer includes an indicator to show your best effort, your nurse or respiratory therapist will set it to a desired goal. If possible, sit up straight or lean slightly forward. Try not to slouch. Hold the incentive spirometer in an upright position. INSTRUCTIONS FOR USE  Sit on the edge of your bed if possible,  or sit up as far as you can in bed or on a chair. Hold the incentive spirometer in an upright position. Breathe out normally. Place the mouthpiece in your mouth and seal your lips tightly around it. Breathe in slowly and as deeply as possible, raising the piston or the ball toward the top of the column. Hold your breath for 3-5 seconds or for as long as possible. Allow the piston or ball to fall to the bottom of the column. Remove the mouthpiece from your mouth and breathe out normally. Rest for a few seconds and repeat Steps 1 through 7 at least 10 times every 1-2 hours when you are awake. Take your time and take a few normal breaths between deep breaths. The spirometer may include an indicator to show your best effort. Use the indicator as a goal to work toward during each repetition. After each set of 10 deep breaths, practice coughing to  be sure your lungs are clear. If you have an incision (the cut made at the time of surgery), support your incision when coughing by placing a pillow or rolled up towels firmly against it. Once you are able to get out of bed, walk around indoors and cough well. You may stop using the incentive spirometer when instructed by your caregiver.  RISKS AND COMPLICATIONS Take your time so you do not get dizzy or light-headed. If you are in pain, you may need to take or ask for pain medication before doing incentive spirometry. It is harder to take a deep breath if you are having pain. AFTER USE Rest and breathe slowly and easily. It can be helpful to keep track of a log of your progress. Your caregiver can provide you with a simple table to help with this. If you are using the spirometer at home, follow these instructions: SEEK MEDICAL CARE IF:  You are having difficultly using the spirometer. You have trouble using the spirometer as often as instructed. Your pain medication is not giving enough relief while using the spirometer. You develop fever of 100.5 F (38.1  C) or higher.                                                                                                    SEEK IMMEDIATE MEDICAL CARE IF:  You cough up bloody sputum that had not been present before. You develop fever of 102 F (38.9 C) or greater. You develop worsening pain at or near the incision site. MAKE SURE YOU:  Understand these instructions. Will watch your condition. Will get help right away if you are not doing well or get worse. Document Released: 09/17/2006 Document Revised: 07/30/2011 Document Reviewed: 11/18/2006 [facility] Patient Information 2014 [place], [place].

## 2023-07-05 NOTE — Progress Notes (Addendum)
COVID Vaccine received:  []  No [x]  Yes Date of any COVID positive Test in last 90 days: none  PCP - Lynnea Ferrier, MD  clearance scanned to Media Cardiologist - none Hematology- Doreatha Massed, MD Clearance scanned to Media  PATIENT RESTRICTS HER LEFT ARM d/t Breast cancer/ mastectomy  Chest x-ray - 11-26-2022   2v  Epic EKG -06-15-2019  Epic    will repeat   Stress Test -  ECHO -10-22-2012  Epic  Cardiac Cath -   PCR screen: []  Ordered & Completed []   No Order but Needs PROFEND     [x]   N/A for this surgery  Surgery Plan:  [x]  Ambulatory   []  Outpatient in bed  []  Admit Anesthesia:    []  General  []  Spinal  [x]   Choice []   MAC  Pacemaker / ICD device [x]  No []  Yes   Spinal Cord Stimulator:[x]  No []  Yes       History of Sleep Apnea? [x]  No []  Yes   CPAP used?- [x]  No []  Yes    Does the patient monitor blood sugar?   [x]  N/A   []  No []  Yes  Patient has: [x]  NO Hx DM   []  Pre-DM   []  DM1  []   DM2  Blood Thinner / Instructions:  None Aspirin Instructions:  none    patient has Von Willebrand factor  ERAS Protocol Ordered: []  No  [x]  Yes PRE-SURGERY [x]  ENSURE  []  G2  Patient is to be NPO after: 05:30  Dental hx: []  Dentures:  [x]  N/A      []  Bridge or Partial:                   []  Loose or Damaged teeth:   Activity level: Patient is able to climb a flight of stairs without difficulty; [x]  No CP  [x]  No SOB.  Patient can perform ADLs without assistance.   Anesthesia review: Von Willebrand disease, GERD, Hx TIA 2014  Patient denies shortness of breath, fever, cough and chest pain at PAT appointment.  Patient verbalized understanding and agreement to the Pre-Surgical Instructions that were given to them at this PAT appointment. Patient was also educated of the need to review these PAT instructions again prior to her surgery.I reviewed the appropriate phone numbers to call if they have any and questions or concerns.

## 2023-07-08 ENCOUNTER — Encounter (HOSPITAL_COMMUNITY)
Admission: RE | Admit: 2023-07-08 | Discharge: 2023-07-08 | Disposition: A | Discharge status: Home / Self Care | Payer: Medicare Other | Source: Ambulatory Visit | Attending: Specialist | Admitting: Specialist

## 2023-07-08 ENCOUNTER — Other Ambulatory Visit: Payer: Self-pay

## 2023-07-08 ENCOUNTER — Encounter (HOSPITAL_COMMUNITY): Payer: Self-pay

## 2023-07-08 ENCOUNTER — Other Ambulatory Visit: Payer: Medicare Other

## 2023-07-08 VITALS — BP 150/86 | HR 95 | Temp 98.8°F | Resp 16 | Ht 65.0 in | Wt 132.0 lb

## 2023-07-08 DIAGNOSIS — R3989 Other symptoms and signs involving the genitourinary system: Secondary | ICD-10-CM

## 2023-07-08 DIAGNOSIS — D6801 Von willebrand disease, type 1: Secondary | ICD-10-CM | POA: Insufficient documentation

## 2023-07-08 DIAGNOSIS — Z01818 Encounter for other preprocedural examination: Secondary | ICD-10-CM | POA: Diagnosis not present

## 2023-07-08 HISTORY — DX: Unspecified osteoarthritis, unspecified site: M19.90

## 2023-07-08 HISTORY — DX: Essential (primary) hypertension: I10

## 2023-07-08 LAB — URINALYSIS, ROUTINE W REFLEX MICROSCOPIC
Bilirubin Urine: NEGATIVE
Glucose, UA: NEGATIVE
Hgb urine dipstick: NEGATIVE
Ketones, ur: NEGATIVE
Leukocytes,Ua: NEGATIVE
Nitrite: NEGATIVE
Protein, ur: NEGATIVE
Specific Gravity, Urine: 1.008 (ref 1.001–1.035)
pH: 6 (ref 5.0–8.0)

## 2023-07-08 LAB — CBC
HCT: 42.1 % (ref 36.0–46.0)
Hemoglobin: 13.6 g/dL (ref 12.0–15.0)
MCH: 30.8 pg (ref 26.0–34.0)
MCHC: 32.3 g/dL (ref 30.0–36.0)
MCV: 95.5 fL (ref 80.0–100.0)
Platelets: 217 10*3/uL (ref 150–400)
RBC: 4.41 MIL/uL (ref 3.87–5.11)
RDW: 13.2 % (ref 11.5–15.5)
WBC: 5.2 10*3/uL (ref 4.0–10.5)
nRBC: 0 % (ref 0.0–0.2)

## 2023-07-08 LAB — BASIC METABOLIC PANEL
Anion gap: 7 (ref 5–15)
BUN: 13 mg/dL (ref 8–23)
CO2: 27 mmol/L (ref 22–32)
Calcium: 9.7 mg/dL (ref 8.9–10.3)
Chloride: 103 mmol/L (ref 98–111)
Creatinine, Ser: 0.69 mg/dL (ref 0.44–1.00)
GFR, Estimated: >60 mL/min (ref >60)
Glucose, Bld: 102 mg/dL — ABNORMAL HIGH (ref 70–99)
Potassium: 4.4 mmol/L (ref 3.5–5.1)
Sodium: 137 mmol/L (ref 135–145)

## 2023-07-09 ENCOUNTER — Encounter: Payer: Self-pay | Admitting: Family Medicine

## 2023-07-09 ENCOUNTER — Ambulatory Visit (INDEPENDENT_AMBULATORY_CARE_PROVIDER_SITE_OTHER): Payer: Medicare Other | Admitting: Family Medicine

## 2023-07-09 ENCOUNTER — Telehealth: Payer: Self-pay

## 2023-07-09 ENCOUNTER — Ambulatory Visit: Payer: Self-pay | Admitting: Family Medicine

## 2023-07-09 VITALS — BP 120/78 | HR 89 | Temp 98.4°F | Ht 65.0 in | Wt 132.2 lb

## 2023-07-09 DIAGNOSIS — J069 Acute upper respiratory infection, unspecified: Secondary | ICD-10-CM | POA: Insufficient documentation

## 2023-07-09 DIAGNOSIS — J029 Acute pharyngitis, unspecified: Secondary | ICD-10-CM | POA: Diagnosis not present

## 2023-07-09 NOTE — Telephone Encounter (Signed)
Copied from CRM [id number]. Topic: General - Other >> Jul 09, 2023 10:37 AM Kristie Cowman wrote: Patient was seen this morning by Dr. Riley Nearing for a "tickle in her throat".  Patient is scheduled for surgery tomorrow.  Notation was made in her chart that she has a "viral upper respiratory infection" so the anesthesiologist cancelled her surgery.  Patient is upset and would like Dr. Riley Nearing to call her back today regarding this.   Best number for the patient - [phone]

## 2023-07-09 NOTE — Telephone Encounter (Signed)
Copied from CRM [id number]. Topic: Clinical - Red Word Triage >> Jul 09, 2023  8:43 AM Caroline Valdez wrote: Red Word that prompted transfer to Nurse Triage: The patient has sinus drainage, scratchy throat, and cough. However, she has surgery tomorrow morning and wants to make sure that she will be okay for surgery.  Chief Complaint: cough & sore throat Symptoms: cough non-productive, scratchy throat w/ blisters, drainage in back of throat Frequency: 2-3 days Pertinent Negatives: Patient denies fever Disposition: [] ED /[] Urgent Care (no appt availability in office) / [x] Appointment(In office/virtual)/ []  [facility]/ [] Home Care/ [] Refused Recommended Disposition /[] [facility]/ []  Follow-up with PCP Additional Notes: pt scheduled for surgery tomorrow & would like evaluation to ensure no infection.  Reason for Disposition  SEVERE (e.g., excruciating) throat pain    Blisters in throat  SEVERE coughing spells (e.g., whooping sound after coughing, vomiting after coughing)    Had 10 minute coughing spell last night  Answer Assessment - Initial Assessment Questions 1. ONSET: "When did the cough begin?"      X 2-3 days 2. SEVERITY: "How bad is the cough today?"      mild 3. SPUTUM: "Describe the color of your sputum" (none, dry cough; clear, white, yellow, green)     Non-productive 4. HEMOPTYSIS: "Are you coughing up any blood?" If so ask: "How much?" (flecks, streaks, tablespoons, etc.)     N/a 5. DIFFICULTY BREATHING: "Are you having difficulty breathing?" If Yes, ask: "How bad is it?" (e.g., mild, moderate, severe)    - MILD: No SOB at rest, mild SOB with walking, speaks normally in sentences, can lie down, no retractions, pulse < 100.    - MODERATE: SOB at rest, SOB with minimal exertion and prefers to sit, cannot lie down flat, speaks in phrases, mild retractions, audible wheezing, pulse 100-120.    - SEVERE: Very SOB at rest, speaks in single words, struggling to breathe,  sitting hunched forward, retractions, pulse > 120      no 6. FEVER: "Do you have a fever?" If Yes, ask: "What is your temperature, how was it measured, and when did it start?"     no 7. CARDIAC HISTORY: "Do you have any history of heart disease?" (e.g., heart attack, congestive heart failure)      no 8. LUNG HISTORY: "Do you have any history of lung disease?"  (e.g., pulmonary embolus, asthma, emphysema)     no 9. PE RISK FACTORS: "Do you have a history of blood clots?" (or: recent major surgery, recent prolonged travel, bedridden)     No 10. OTHER SYMPTOMS: "Do you have any other symptoms?" (e.g., runny nose, wheezing, chest pain)       Scratchy throat, blister in throat, coughing-nonproductive, sinus drainage in back of throat 11. PREGNANCY: "Is there any chance you are pregnant?" "When was your last menstrual period?"       N/a 12. TRAVEL: "Have you traveled out of the country in the last month?" (e.g., travel history, exposures)       N/a  Answer Assessment - Initial Assessment Questions 1. ONSET: "When did the throat start hurting?" (Hours or days ago)      X 2-3 days 2. SEVERITY: "How bad is the sore throat?" (Scale 1-10; mild, moderate or severe)   - MILD (1-3):  Doesn't interfere with eating or normal activities.   - MODERATE (4-7): Interferes with eating some solids and normal activities.   - SEVERE (8-10):  Excruciating pain, interferes with  most normal activities.   - SEVERE WITH DYSPHAGIA (10): Can't swallow liquids, drooling.     Mild - scratchy throat 3. STREP EXPOSURE: "Has there been any exposure to strep within the past week?" If Yes, ask: "What type of contact occurred?"      unknown 4.  VIRAL SYMPTOMS: "Are there any symptoms of a cold, such as a runny nose, cough, hoarse voice or red eyes?"      Pt c/o sinus draining in back of throat 5. FEVER: "Do you have a fever?" If Yes, ask: "What is your temperature, how was it measured, and when did it start?"     no 6. PUS  ON THE TONSILS: "Is there pus on the tonsils in the back of your throat?"     Blister in throat 7. OTHER SYMPTOMS: "Do you have any other symptoms?" (e.g., difficulty breathing, headache, rash)     no 8. PREGNANCY: "Is there any chance you are pregnant?" "When was your last menstrual period?"     N/a  Protocols used: Cough - Acute Non-Productive-A-AH, Sore Throat-A-AH

## 2023-07-09 NOTE — Progress Notes (Addendum)
Patient Office Visit  Assessment & Plan:  Viral upper respiratory tract infection  Sore throat -     STREP GROUP A AG, W/REFLEX TO CULT   Strep negative.  Patient aware.  Patient will continue to gargle warm salt water, OTC Chloraseptic spray.  If no improvement or worsening she will notify us. Return if symptoms worsen or fail to improve.   Subjective:    Patient ID: Caroline Valdez, female    DOB: 1951/04/12  Age: 73 y.o. MRN: [id number]  Chief Complaint  Patient presents with   Cough    Non productive cough x 1 day. Itchy throat. Concerned about sx due to having shoulder surgery tomorrow.     HPI URI/sore throat- pt will have right rotator cuff surgery tomorrow per Dr. Shelle Iron in [place].  Patient wants to make sure she does not have an infection.  Patient has been gargling with warm salt water.  No ill contacts or exposure to strep but wants to make sure she does not have this.  Patient did see a tiny blister on the left side when she looked in her throat this morning.Pt having a dry cough but no SOB or wheezing. No fever or chills. Pt did receive flu shot this season. No tobacco use.   The ASCVD Risk score (Arnett DK, et al., 2019) failed to calculate for the following reasons:   Risk score cannot be calculated because patient has a medical history suggesting prior/existing ASCVD  Past Medical History:  Diagnosis Date   Arthritis    Blood dyscrasia    Von Willebrand factor   Breast cancer (HCC)    left   Complication of anesthesia    1980S  TUBAL LIG, + HEMM SURGERY PT PASSED OUT AND WENT INTO SHOCK.  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    Endometriosis    Fibromyalgia    GERD (gastroesophageal reflux disease)    Hypercholesteremia    Hypertension    Mini stroke 09/2012   Stroke [facility])    MINI STROKE   Von Willebrand disease (HCC) 09/11/2013   Past Surgical History:  Procedure Laterality Date   ABDOMINAL HYSTERECTOMY     AUGMENTATION  MAMMAPLASTY Right    BACK SURGERY     BREAST IMPLANT REMOVAL Right 12/05/2017   BREAST IMPLANT REMOVAL Right 12/05/2017   Procedure: REMOVAL RIGHT  BREAST IMPLANT MATERIAL;  Surgeon: Etter Sjogren, MD;  Location: [facility];  Service: Plastics;  Laterality: Right;   BREAST RECONSTRUCTION     BREAST RECONSTRUCTION Right 12/05/2017   Procedure: DELAYED BREAST RECONSTRUCTION WITH SILICONE IMPLANT;  Surgeon: Etter Sjogren, MD;  Location[facility] OR;  Service: Plastics;  Laterality: Right;   CARPAL TUNNEL RELEASE     right   COLONOSCOPY  01/2010   Dr. Franky Macho: normal   COLONOSCOPY WITH PROPOFOL N/A 01/22/2019   Procedure: COLONOSCOPY WITH PROPOFOL;  Surgeon: Corbin Ade, MD;  Location[facility];  Service: Endoscopy;  Laterality: N/A;  2:30pm - can not come earlier due to transportation   FINGER SURGERY     LEFT THUMB   HEMORRHOID SURGERY     X2   LAMINECTOMY     MASTECTOMY     left   SHOULDER SURGERY Right    SKIN CANCER EXCISION     nasal basal cell lesion,    Mohs surgery   TONSILLECTOMY     TUBAL LIGATION     Social History  Tobacco Use   Smoking status: Never   Smokeless tobacco: Never  Vaping Use   Vaping status: Never Used  Substance Use Topics   Alcohol use: No   Drug use: No   Family History  Problem Relation Age of Onset   Breast cancer Mother 69       now 1yo   Stroke Father        Father had no full sisters   Breast cancer Sister 83       now 65yo; had TAH/BSO   Lung cancer Brother        died 5yo; smoker   Allergies  Allergen Reactions   Aspirin Other (See Comments)    Pt has Von Willebrands Disease   Hydrocodone Itching   Oxycodone Itching   Tape Dermatitis    Pulls off skin    ROS    Objective:    BP 120/78   Pulse 89   Temp 98.4 F (36.9 C)   Ht 5\' 5"  (1.651 m)   Wt 132 lb 4 oz (60 kg)   SpO2 99%   BMI 22.01 kg/m  BP Readings from Last 3 Encounters:  07/09/23 120/78  07/08/23 (!) 150/86  07/03/23 132/82   Wt Readings from  Last 3 Encounters:  07/09/23 132 lb 4 oz (60 kg)  07/08/23 132 lb (59.9 kg)  07/03/23 131 lb 6.4 oz (59.6 kg)    Physical Exam Vitals and nursing note reviewed.  Constitutional:      General: She is not in acute distress.    Appearance: Normal appearance.  HENT:     Head: Normocephalic.     Right Ear: Tympanic membrane, ear canal and external ear normal.     Left Ear: Tympanic membrane, ear canal and external ear normal.     Mouth/Throat:     Pharynx: Oropharynx is clear. No oropharyngeal exudate or posterior oropharyngeal erythema.     Comments: Postnasal drip. Pt has tiny blister left side.  Eyes:     Extraocular Movements: Extraocular movements intact.     Pupils: Pupils are equal, round, and reactive to light.  Cardiovascular:     Rate and Rhythm: Normal rate and regular rhythm.     Heart sounds: Normal heart sounds.  Pulmonary:     Effort: Pulmonary effort is normal.     Breath sounds: Normal breath sounds. No wheezing or rhonchi.  Neurological:     General: No focal deficit present.     Mental Status: She is alert and oriented to person, place, and time.      No results found for any visits on 07/09/23.

## 2023-07-10 ENCOUNTER — Ambulatory Visit (HOSPITAL_COMMUNITY): Admission: RE | Admit: 2023-07-10 | Payer: Medicare Other | Source: Home / Self Care | Admitting: Specialist

## 2023-07-10 ENCOUNTER — Encounter (HOSPITAL_COMMUNITY): Admission: RE | Payer: Self-pay | Source: Home / Self Care

## 2023-07-10 SURGERY — REPAIR, ROTATOR CUFF, OPEN
Anesthesia: Choice | Site: Shoulder | Laterality: Right

## 2023-07-11 ENCOUNTER — Ambulatory Visit: Payer: Medicare Other | Admitting: Gastroenterology

## 2023-07-11 LAB — CULTURE, GROUP A STREP
Micro Number: 16097731
SPECIMEN QUALITY:: ADEQUATE

## 2023-07-11 LAB — STREP GROUP A AG, W/REFLEX TO CULT: Streptococcus Group A AG: NOT DETECTED

## 2023-07-12 ENCOUNTER — Encounter: Payer: Self-pay | Admitting: Family Medicine

## 2023-07-24 ENCOUNTER — Ambulatory Visit: Payer: Self-pay | Admitting: Orthopedic Surgery

## 2023-07-24 NOTE — H&P (Signed)
 Caroline Valdez is an 73 y.o. female.   Chief Complaint: right shoulder pain HPI: Visit For: New patient Hand Dominance: right Location: right; shoulder Duration: >3 months Quality: aching; no change Severity: moderate; pain level 7/10 Context: felt an onset of pain after reaching back in the car to hand her granddaughter her backpack Alleviating Factors: rest; limited weight bearing; narcotics (tramadol - PCP) Aggravating Factors: lifting; ROM; weightbearing Associated Symptoms: numbness Previous Surgery: date: (2008 - right shoulder scope in [place] - unsure of surgeon or practice) Prior Imaging: MRI [employer] [facility]) Previous Injections: did not help; right shoulder cortisone injection with PCP x1 month ago - no relief Previous PT: none She was seen by Dr. Tanya Nones she had an MRI of her shoulder which demonstrated over 70% tear of the rotator cuff. She has had a history of rotator cuff repair over 10 years ago. Also has a history of von Willebrand's disease. She has had an injection which did not help.  Past Medical History:  Diagnosis Date   Arthritis    Blood dyscrasia    Von Willebrand factor   Breast cancer (HCC)    left   Complication of anesthesia    1980S  TUBAL LIG, + HEMM SURGERY PT PASSED OUT AND WENT INTO SHOCK.  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    Endometriosis    Fibromyalgia    GERD (gastroesophageal reflux disease)    Hypercholesteremia    Hypertension    Mini stroke 09/2012   Stroke [facility])    MINI STROKE   Von Willebrand disease (HCC) 09/11/2013    Past Surgical History:  Procedure Laterality Date   ABDOMINAL HYSTERECTOMY     AUGMENTATION MAMMAPLASTY Right    BACK SURGERY     BREAST IMPLANT REMOVAL Right 12/05/2017   BREAST IMPLANT REMOVAL Right 12/05/2017   Procedure: REMOVAL RIGHT  BREAST IMPLANT MATERIAL;  Surgeon: Etter Sjogren, MD;  Location: [facility] OR;  Service: Plastics;  Laterality: Right;   BREAST  RECONSTRUCTION     BREAST RECONSTRUCTION Right 12/05/2017   Procedure: DELAYED BREAST RECONSTRUCTION WITH SILICONE IMPLANT;  Surgeon: Etter Sjogren, MD;  Location: [facility] OR;  Service: Plastics;  Laterality: Right;   CARPAL TUNNEL RELEASE     right   COLONOSCOPY  01/2010   Dr. Franky Macho: normal   COLONOSCOPY WITH PROPOFOL N/A 01/22/2019   Procedure: COLONOSCOPY WITH PROPOFOL;  Surgeon: Corbin Ade, MD;  [facility];  Service: Endoscopy;  Laterality: N/A;  2:30pm - can not come earlier due to transportation   FINGER SURGERY     LEFT THUMB   HEMORRHOID SURGERY     X2   LAMINECTOMY     MASTECTOMY     left   SHOULDER SURGERY Right    SKIN CANCER EXCISION     nasal basal cell lesion,    Mohs surgery   TONSILLECTOMY     TUBAL LIGATION      Family History  Problem Relation Age of Onset   Breast cancer Mother 57       now 30yo   Stroke Father        Father had no full sisters   Breast cancer Sister 12       now 34yo; had TAH/BSO   Lung cancer Brother        died 48yo; smoker   Social History:  reports that she has never smoked. She has never used smokeless tobacco.  She reports that she does not drink alcohol and does not use drugs.  Allergies:  Allergies  Allergen Reactions   Aspirin Other (See Comments)    Pt has Von Willebrands Disease   Hydrocodone Itching   Oxycodone Itching   Tape Dermatitis    Pulls off skin    Current meds: acetaminophen 1,000 mg-DM 30 mg oral powder packet Adult Low Dose Aspirin 81 mg tablet,delayed release ALPRAZolam 0.5 mg tablet azelastine-fluticasone chlorpheniramine 2 mg-DM 10 mg-acetaminophen 325 mg tablet montelukast 10 mg tablet ondansetron 4 mg disintegrating tablet One Daily Multivitamins with Minerals 4.5 mg iron tablet pantoprazole 40 mg tablet,delayed release Pepcid 20 mg tablet rosuvastatin 20 mg tablet traMADoL 50 mg tablet Trulance 3 mg tablet Vision Formula (with lutein)  Review of Systems   Constitutional: Negative.   HENT: Negative.    Eyes: Negative.   Respiratory: Negative.    Cardiovascular: Negative.   Gastrointestinal: Negative.   Endocrine: Negative.   Genitourinary: Negative.   Musculoskeletal:  Positive for arthralgias and joint swelling.  Skin: Negative.   Neurological: Negative.   Psychiatric/Behavioral: Negative.      There were no vitals taken for this visit. Physical Exam Constitutional:      Appearance: Normal appearance.  HENT:     Head: Normocephalic and atraumatic.     Right Ear: External ear normal.     Left Ear: External ear normal.     Nose: Nose normal.     Mouth/Throat:     Pharynx: Oropharynx is clear.  Eyes:     Conjunctiva/sclera: Conjunctivae normal.  Cardiovascular:     Rate and Rhythm: Normal rate and regular rhythm.     Pulses: Normal pulses.     Heart sounds: Normal heart sounds.  Pulmonary:     Effort: Pulmonary effort is normal.     Breath sounds: Normal breath sounds.  Abdominal:     General: Bowel sounds are normal.  Musculoskeletal:     Cervical back: Normal range of motion.  Skin:    General: Skin is warm and dry.  Neurological:     Mental Status: She is alert.   Constitutional: General Appearance: healthy-appearing and NAD.  Psychiatric: Mood and Affect: normal mood and affect.  Cardiovascular System: Arterial Pulses Right: radial normal and brachial normal. Varicosities Right: no varicosities.  C-Spine/Neck: Active Range of Motion: flexion normal, extension normal, and no pain elicited on motion.  Shoulders: Inspection Right: no misalignment, atrophy, erythema, swelling, or scapular winging. Bony Palpation Right: no tenderness of the sternoclavicular joint, the coracoid process, the acromioclavicular joint, the bicipital groove, or the scapula. Soft Tissue Palpation Right: tenderness of the supraspinatus and the subacromial bursa. Active Range of Motion Right: limited. Special Tests Right: Speed's test negative  and Neer's test positive. Stability Right: no laxity, sulcus sign negative, and anterior apprehension test negative. Strength Right: abduction 5/5, adduction 5/5, flexion 5/5, and extension 5/5.  Skin: Right Upper Extremity: normal.  Neurological System: Biceps Reflex Right: normal (2). Brachioradialis Reflex Right: normal (2). Triceps Reflex Right: normal (2). Sensation on the Right: C5 normal, C6 normal, and C7 normal.  We can external rotation tender in the anterior subacromial region nontender to the [facility]   Three-view x-rays of the shoulder demonstrate a previous distal clavicle resection the lateral sloping acromion  Outside MRI demonstrates a 70 to 80% high-grade tear of the supraspinatus. Previous distal clavicle resection.  Assessment/Plan Impression:  1. Refractory symptomatic high-grade recurrent tear of the supraspinatus 2. History of rotator cuff repair  distal clavicle resection 3. Von Willebrand's disease  Plan:  We discussed options given her persistent symptoms despite rest activity modification cortisone injection we discussed revision repair with likely use of the patch graft  An extensive discussion concerning the pathology relevant anatomy and treatment options. After that discussion we mutually agreed to proceed with repair of the rotator cuff utilizing arthroscopic assistance if possible. The risks and benefits of that procedure were discussed including bleeding, infection, suboptimal range of motion, deep venous thrombosis, pulmonary embolism, anesthetic complications etc. in addition we discussed the postoperative course to include approximately 4 weeks of passive range of motion followed by 4 weeks of active range of motion followed by 4-12 weeks of progressive strengthening exercises. In addition we discussed protective activities to reduce the risk of a reinjury including impingement activities with elbow above the shoulder as well as reaching and repetitive circular  motion activities. The hospital stay will either be as a outpatient with a regional block versus overnight depending upon the extent of the procedure and any challenging health issues with a first postoperative visit 2 weeks following the surgery.   No history of DVT or MRSA. Able to take aspirin. She does have von Willebrand's. In the past she has had to take IV desmopressin 30 minutes preoperatively to boost the levels of the bone but Willebrand's factor. Will obtain clearances from her medical doctor and hopefully from her hematologist.  Appreciate the kind referral by Dr. Tanya Nones. We discussed utilizing a patch graft  Plan revision mini-open RCR right shoulder with patch graft  Dorothy Spark, PA-C for Dr Shelle Iron 07/24/2023, 1:39 PM

## 2023-07-29 ENCOUNTER — Ambulatory Visit (INDEPENDENT_AMBULATORY_CARE_PROVIDER_SITE_OTHER): Payer: Medicare Other | Admitting: Gastroenterology

## 2023-07-29 ENCOUNTER — Encounter: Payer: Self-pay | Admitting: Gastroenterology

## 2023-07-29 VITALS — BP 144/88 | HR 86 | Temp 98.6°F | Ht 65.0 in | Wt 131.6 lb

## 2023-07-29 DIAGNOSIS — K5900 Constipation, unspecified: Secondary | ICD-10-CM | POA: Diagnosis not present

## 2023-07-29 DIAGNOSIS — K649 Unspecified hemorrhoids: Secondary | ICD-10-CM | POA: Diagnosis not present

## 2023-07-29 DIAGNOSIS — K219 Gastro-esophageal reflux disease without esophagitis: Secondary | ICD-10-CM | POA: Diagnosis not present

## 2023-07-29 DIAGNOSIS — K5904 Chronic idiopathic constipation: Secondary | ICD-10-CM

## 2023-07-29 NOTE — Progress Notes (Signed)
 GI Office Note    Referring Provider: Donita Brooks, MD Primary Care Physician:  Donita Brooks, MD Primary Gastroenterologist: Gerrit Friends.Rourk, MD  Date:  07/29/2023  ID:  Caroline Valdez, DOB January 29, 1951, MRN [id number]   Chief Complaint   Chief Complaint  Patient presents with   Follow-up    Follow up on constipation and GERD. Pt states she is better but still has some problems   History of Present Illness  Caroline Valdez is a 73 y.o. female with a history of constipation, von Willebrand disease, stroke, HLD, GERD, fibromyalgia, endometriosis, and breast cancer presenting today for follow-up of chronic constipation and GERD.  Colonoscopy September 2020 with diverticulosis in the sigmoid and descending colon, redundant colon, otherwise normal.  No repeat advised.   Virtual visit 04/16/2022.  Taking pantoprazole and famotidine, still having reflux symptoms occasionally but has not had to use Tums in 1 month.  Has began having a cough at the beginning Linzess therefore she stopped this.  Notes that she has too much fiber she gets constipated.  Proximal staying hydrated.  Denies any straining.  Hemorrhoid improves with having better bowel movements.  Denies any frequent rectal bleeding.  Using Preparation H suppository about 2 weeks prior to visit due to itching and burning.  Advised to start Amitiza 8 mcg twice daily.  Linzess.  Encouraged adequate hydration.  May use over-the-counter hemorrhoid suppositories as needed.  Continue PPI and cimetidine twice daily.  Advised to follow-up in 3-4 months, sooner if needed and to notify if unable to obtain Amitiza.  Had stomach cramping and nausea with Amitiza.  Linzess had worked in the past, however it eventually stopped working.   Last office visit 07/19/22.  Constipation doing well on Trulance.  Having daily soft bowel movements without any abdominal pain.  Reflux doing okay.  Sometimes has bad days of symptoms and will need Tums which  helps provide relief.  Have been continued to take her PPI and cimetidine twice daily.  Hemorrhoids stable.  Using Preparation H cream and suppositories as needed.  Denied any rectal bleeding.  Advised to continue current regimen.  No medication changes made.   Last office visit 01/10/23.  Pulmonologist placed her on Pepcid 20 mg nightly and took her off cimetidine.  She was taking pantoprazole twice daily.  Symptoms are not worse.  Keeps her head elevated about 6 inches.  Denied nausea or vomiting.  Denied any dysphagia but felt like throat was clogged up at times.  Had a decreased cough.  Taking Trulance every day.  Last few weeks prior it was getting harder to go the need to strain.  She was taking some cough syrup with codeine at the time.  Was not taking anything additional with her Trulance at the time of her last office visit.  Had a hemorrhoid flare from straining, had been using Preparation H.  Advised Trulance and adding MiraLAX to her regimen.  Continue pantoprazole 40 mg twice daily and famotidine 20 mg nightly.  Advised her to use a suppository if she did not have a bowel movement in 2 days.  She is advised continue over-the-counter Preparation H for hemorrhoids.  Patient updated on 8/26 that she was taking Trulance and MiraLAX and doing well.  Today:  GERD - at times her reflux is really bad and sometimes its okay. Having more good days than bad. Taking pantoprazole twice daily and famotidine 20 nightly. Has trid othe rPI in the past but unable to  remember the name.  No N/V.   Constipation - better but every few months she has to take miralax for a few days and then good after that for a while. Every other month she may have to do this. Straining when she needs the miralax and then otherwise she is okay. Having a BM 2-3 times in the morning before 9am and then she is good the rest of the day. No bloating or abdominal pain.   Hemorrhoids - No brbpr . Will use otc cream if she needs it.   Is  going to have rotator cuff shoulder surgery in a couple weeks.   Wt Readings from Last 3 Encounters:  07/29/23 131 lb 9.6 oz (59.7 kg)  07/09/23 132 lb 4 oz (60 kg)  07/08/23 132 lb (59.9 kg)    Current Outpatient Medications  Medication Sig Dispense Refill   acetaminophen (TYLENOL) 500 MG tablet Take 1,000 mg by mouth every 6 (six) hours as needed for headache or moderate pain (pain score 4-6).     ALPRAZolam (XANAX) 0.5 MG tablet TAKE 1 TABLET(0.5 MG) BY MOUTH AT BEDTIME AS NEEDED 60 tablet 1   Azelastine-Fluticasone (DYMISTA) 137-50 MCG/ACT SUSP Place 1 puff into the nose 2 (two) times daily. (Patient taking differently: Place 1 puff into the nose 2 (two) times daily as needed (allergies).) 23 g 11   chlorpheniramine (ALLERGY RELIEF) 4 MG tablet Take 4 mg by mouth daily.     famotidine (PEPCID) 20 MG tablet One at least an hour before bedtime 30 tablet 11   montelukast (SINGULAIR) 10 MG tablet TAKE 1 TABLET AT BEDTIME 90 tablet 2   Multiple Vitamin (MULTIVITAMIN WITH MINERALS) TABS tablet Take 1 tablet by mouth daily.     Multiple Vitamins-Minerals (EQ VISION FORMULA 50+ PO) Take 1 tablet by mouth daily.     ondansetron (ZOFRAN) 4 MG tablet TAKE 1 TABLET(4 MG) BY MOUTH EVERY 8 HOURS AS NEEDED FOR NAUSEA OR VOMITING 20 tablet 0   pantoprazole (PROTONIX) 40 MG tablet Take 1 tablet (40 mg total) by mouth 2 (two) times daily. 180 tablet 2   Plecanatide (TRULANCE) 3 MG TABS Take 1 tablet (3 mg total) by mouth daily. 90 tablet 3   rosuvastatin (CRESTOR) 20 MG tablet Take 1 tablet (20 mg total) by mouth daily. 90 tablet 2   traMADol (ULTRAM) 50 MG tablet Take 1 tablet (50 mg total) by mouth every 6 (six) hours as needed. (Patient taking differently: Take 50 mg by mouth at bedtime.) 60 tablet 2   No current facility-administered medications for this visit.    Past Medical History:  Diagnosis Date   Arthritis    Blood dyscrasia    Von Willebrand factor   Breast cancer (HCC)    left    Complication of anesthesia    1980S  TUBAL LIG, + HEMM SURGERY PT PASSED OUT AND WENT INTO SHOCK.  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    Endometriosis    Fibromyalgia    GERD (gastroesophageal reflux disease)    Hypercholesteremia    Hypertension    Mini stroke 09/2012   Stroke [facility])    MINI STROKE   Von Willebrand disease (HCC) 09/11/2013    Past Surgical History:  Procedure Laterality Date   ABDOMINAL HYSTERECTOMY     AUGMENTATION MAMMAPLASTY Right    BACK SURGERY     BREAST IMPLANT REMOVAL Right 12/05/2017   BREAST IMPLANT REMOVAL Right 12/05/2017  Procedure: REMOVAL RIGHT  BREAST IMPLANT MATERIAL;  Surgeon: Etter Sjogren, MD;  Location: [facility] OR;  Service: Plastics;  Laterality: Right;   BREAST RECONSTRUCTION     BREAST RECONSTRUCTION Right 12/05/2017   Procedure: DELAYED BREAST RECONSTRUCTION WITH SILICONE IMPLANT;  Surgeon: Etter Sjogren, MD;  Location: [facility] OR;  Service: Plastics;  Laterality: Right;   CARPAL TUNNEL RELEASE     right   COLONOSCOPY  01/2010   Dr. Franky Macho: normal   COLONOSCOPY WITH PROPOFOL N/A 01/22/2019   Procedure: COLONOSCOPY WITH PROPOFOL;  Surgeon: Corbin Ade, MD;  [facility];  Service: Endoscopy;  Laterality: N/A;  2:30pm - can not come earlier due to transportation   FINGER SURGERY     LEFT THUMB   HEMORRHOID SURGERY     X2   LAMINECTOMY     MASTECTOMY     left   SHOULDER SURGERY Right    SKIN CANCER EXCISION     nasal basal cell lesion,    Mohs surgery   TONSILLECTOMY     TUBAL LIGATION      Family History  Problem Relation Age of Onset   Breast cancer Mother 65       now 68yo   Stroke Father        Father had no full sisters   Breast cancer Sister 2       now 33yo; had TAH/BSO   Lung cancer Brother        died 67yo; smoker    Allergies as of 07/29/2023 - Review Complete 07/29/2023  Allergen Reaction Noted   Aspirin Other (See Comments) 02/15/2011   Hydrocodone Itching  03/31/2018   Oxycodone Itching 03/31/2018   Tape Dermatitis 06/21/2015    Social History   Socioeconomic History   Marital status: Widowed    Spouse name: Not on file   Number of children: 1   Years of education: Not on file   Highest education level: 12th grade  Occupational History   Not on file  Tobacco Use   Smoking status: Never   Smokeless tobacco: Never  Vaping Use   Vaping status: Never Used  Substance and Sexual Activity   Alcohol use: No   Drug use: No   Sexual activity: Not on file    Comment: husband recently died from myelodysplastic syndrome in January of 2014  Other Topics Concern   Not on file  Social History Narrative   1 Daughter, April   1 grandson-20, 1 granddaughter-4.   Keeps granddaughter during the week.   Social Drivers of Corporate investment banker Strain: Low Risk  (05/03/2023)   Overall Financial Resource Strain (CARDIA)    Difficulty of Paying Living Expenses: Not hard at all  Food Insecurity: No Food Insecurity (06/18/2023)   Hunger Vital Sign    Worried About Running Out of Food in the Last Year: Never true    Ran Out of Food in the Last Year: Never true  Transportation Needs: No Transportation Needs (06/18/2023)   PRAPARE - Administrator, Civil Service (Medical): No    Lack of Transportation (Non-Medical): No  Physical Activity: Insufficiently Active (05/03/2023)   Exercise Vital Sign    Days of Exercise per Week: 2 days    Minutes of Exercise per Session: 30 min  Stress: No Stress Concern Present (05/03/2023)   [employer] of Occupational Health - Occupational Stress Questionnaire    Feeling of Stress : Only a little  Social Connections:  Unknown (05/03/2023)   Social Connection and Isolation Panel [NHANES]    Frequency of Communication with Friends and Family: Patient declined    Frequency of Social Gatherings with Friends and Family: Patient declined    Attends Religious Services: Patient declined    Automotive engineer or Organizations: Patient declined    Attends Banker Meetings: Not on file    Marital Status: Widowed   Review of Systems   Gen: Denies fever, chills, anorexia. Denies fatigue, weakness, weight loss.  CV: Denies chest pain, palpitations, syncope, peripheral edema, and claudication. Resp: + throat clearing, mild cough. + sneezing. Denies dyspnea at rest, cough, wheezing, coughing up blood, and pleurisy. GI: See HPI Derm: Denies rash, itching, dry skin Psych: Denies depression, anxiety, memory loss, confusion. No homicidal or suicidal ideation.  Heme: Denies bruising, bleeding, and enlarged lymph nodes. + joint/muscle pain to right shoulder  Physical Exam   BP (!) 144/88   Pulse 86   Temp 98.6 F (37 C)   Ht 5\' 5"  (1.651 m)   Wt 131 lb 9.6 oz (59.7 kg)   BMI 21.90 kg/m   General:   Alert and oriented. No distress noted. Pleasant and cooperative.  Head:  Normocephalic and atraumatic. Eyes:  Conjuctiva clear without scleral icterus. Mouth:  Oral mucosa pink and moist. Good dentition. No lesions. Abdomen:  +BS, soft, non-tender and non-distended. No rebound or guarding. No HSM or masses noted. Rectal: deferred Msk:  limited movement to RUE.  Neurologic:  Alert and  oriented x4 Psych:  Alert and cooperative. Normal mood and affect.  Assessment  Caroline Valdez is a 73 y.o. female with a history of constipation, von Willebrand disease, stroke, HLD, GERD, fibromyalgia, endometriosis, and breast cancer presenting today for follow-up of chronic constipation and GERD.  GERD: Not very well controlled, still having occasional reflux symptoms that can last for days. Reassuringly she is having more good days than bad. Maintained on pantoprazole 40 BID and famotidine 20mg  nightly. Has tried other PPI in the past but she is not sure what that is. (Has tried cimetidine in the past). Continue to clear her throat often. Given she has been on pantoprazole long term  we could consider switching to nexium, lansoprazole, dexilant, or aciphex. Other option is voquezna. Recommend EGD however given she is having upcoming shoulder surgery, will wait to proceed with this until she has had some recovery time.  For now we will continue pantoprazole 40 mg twice daily and increase her famotidine to 40 mg nightly ensuring she is taking pantoprazole prior to dinner and famotidine prior to bedtime.  Constipation: Has been doing fairly well with just Trulance.  Denies any straining.  Having a bowel movement daily on average.  Every other month for just a couple days of the month she will take some MiraLAX as she will have a little bit of straining.  Denies any BRBPR or abdominal pain.  Notably she has tried and failed Amitiza due to side effects, had noticed some improvement in the past with Linzess (feels like this may not have caused her cough).  Advised that during her recovery she should resume MiraLAX daily to avoid worsening constipation.   Hemorrhoids: Doing well so far. With flares uses preparation H otc.   PLAN   Trulance 3 mg once daily.  Miralax 17g as needed, use more frequently post surgery Pantoprazole 40 mg twice daily.  Famotidine 40 mg once nightly.  Can consider switching PPI between now  and next visit if patient prefers. GERD diet EGD in near future after shoulder recovery, discuss at F/u Follow up in 4 months.   Brooke Bonito, MSN, FNP-BC, AGACNP-BC [facility]

## 2023-07-29 NOTE — Patient Instructions (Addendum)
 New pantoprazole 40 mg twice daily.  I want you to increase your famotidine to 40 mg once nightly.  If between now and your next follow-up you decide you would like to trial a different medication other than pantoprazole, please reach out.  Follow a GERD diet:  Avoid fried, fatty, greasy, spicy, citrus foods. Avoid caffeine and carbonated beverages. Avoid chocolate. Try eating 4-6 small meals a day rather than 3 large meals. Do not eat within 3 hours of laying down. Prop head of bed up on wood or bricks to create a 6 inch incline.  Continue taking Trulance 3 mg once daily.  After your shoulder surgery, if you are on narcotics I want you to start MiraLAX daily and you can increase up to twice a day if needed to avoid significant constipation.  Ensure you are getting adequate intake of water.   We will plan to follow-up in 4 months.  Hopefully by this time you will be on the road to recovery from your shoulder surgery and we can discuss performing an upper endoscopy at that time given your intermittent reflux symptoms despite higher doses of medication.  It was a pleasure to see you today. I want to create trusting relationships with patients. If you receive a survey regarding your visit,  I greatly appreciate you taking time to fill this out on paper or through your MyChart. I value your feedback.  Brooke Bonito, MSN, FNP-BC, AGACNP-BC [facility]

## 2023-08-08 NOTE — Patient Instructions (Signed)
 SURGICAL WAITING ROOM VISITATION Patients having surgery or a procedure may have no more than 2 support people in the waiting area - these visitors may rotate in the visitor waiting room.   Due to an increase in RSV and influenza rates and associated hospitalizations, children ages 80 and under may not visit patients in [facility] hospitals. If the patient needs to stay at the hospital during part of their recovery, the visitor guidelines for inpatient rooms apply.   PRE-OP VISITATION  Pre-op nurse will coordinate an appropriate time for 1 support person to accompany the patient in pre-op.  This support person may not rotate.  This visitor will be contacted when the time is appropriate for the visitor to come back in the pre-op area.   Please refer to the [facility] website for the visitor guidelines for Inpatients (after your surgery is over and you are in a regular room).   You are not required to quarantine at this time prior to your surgery. However, you must do this: Hand Hygiene often Do NOT share personal items Notify your provider if you are in close contact with someone who has COVID or you develop fever 100.4 or greater, new onset of sneezing, cough, sore throat, shortness of breath or body aches.  If you test positive for Covid or have been in contact with anyone that has tested positive in the last 10 days please notify you surgeon.     Your procedure is scheduled on:  Wednesday  August 14, 2023   Report to [facility] Main Entrance: [facility] entrance where the [employer] is available.    Report to admitting at:  08:15 AM   Call this number if you have any questions or problems the morning of surgery [phone]   FOLLOW ANY ADDITIONAL PRE OP INSTRUCTIONS YOU RECEIVED FROM YOUR SURGEON'S OFFICE!!!   Do not eat food after Midnight the night prior to your surgery/procedure.   After Midnight you may have the following liquids until  07:30  AM  DAY OF SURGERY    Clear Liquid Diet Water Black Coffee (sugar ok, NO MILK/CREAM OR CREAMERS)  Tea (sugar ok, NO MILK/CREAM OR CREAMERS) regular and decaf                             Plain Jell-O  with no fruit (NO RED)                                           Fruit ices (not with fruit pulp, NO RED)                                     Popsicles (NO RED)                                                                  Juice: NO CITRUS JUICES: only apple, WHITE grape, WHITE cranberry Sports drinks like Gatorade or Powerade (NO RED)  The day of surgery:  Drink ONE (1) Pre-Surgery Clear Ensure at   07:30 AM the morning of surgery. Drink in one sitting. Do not sip.  This drink was given to you during your hospital pre-op appointment visit. Nothing else to drink after completing the Pre-Surgery Clear Ensure : No candy, chewing gum or throat lozenges.      Oral Hygiene is also important to reduce your risk of infection.        Remember - BRUSH YOUR TEETH THE MORNING OF SURGERY WITH YOUR REGULAR TOOTHPASTE   Do NOT smoke after Midnight the night before surgery.   STOP TAKING all Vitamins, Herbs and supplements 1 week before your surgery.    Take ONLY these medicines the morning of surgery with A SIP OF WATER: pantoprazole, and you may take EITHER Tylenol OR Tramadol if needed for pain.  You may use your nasal spray if needed.    You may not have any metal on your body including hair pins, jewelry, and body piercing   Do not wear make-up, lotions, powders, perfumes  or deodorant   Do not wear nail polish including gel and S&S, artificial / acrylic nails, or any other type of covering on natural nails including finger and toenails. If you have artificial nails, gel coating, etc., that needs to be removed by a nail salon, Please have this removed prior to surgery. Not doing so may mean that your surgery could be cancelled or delayed if the Surgeon or anesthesia staff feels like they are  unable to monitor you safely.    Do not shave 48 hours prior to surgery to avoid nicks in your skin which may contribute to postoperative infections.    Contacts, Hearing Aids, dentures or bridgework may not be worn into surgery. DENTURES WILL BE REMOVED PRIOR TO SURGERY PLEASE DO NOT APPLY "Poly grip" OR ADHESIVES!!!   Patients discharged on the day of surgery will not be allowed to drive home.  Someone NEEDS to stay with you for the first 24 hours after anesthesia.   Do not bring your home medications to the hospital. The Pharmacy will dispense medications listed on your medication list to you during your admission in the Hospital.   Please read over the following fact sheets you were given: IF YOU HAVE QUESTIONS ABOUT YOUR PRE-OP INSTRUCTIONS, PLEASE CALL [phone]     [facility] - Preparing for Surgery Before surgery, you can play an important role.  Because skin is not sterile, your skin needs to be as free of germs as possible.  You can reduce the number of germs on your skin by washing with CHG (chlorahexidine gluconate) soap before surgery.  CHG is an antiseptic cleaner which kills germs and bonds with the skin to continue killing germs even after washing. Please DO NOT use if you have an allergy to CHG or antibacterial soaps.  If your skin becomes reddened/irritated stop using the CHG and inform your nurse when you arrive at Short Stay. Do not shave (including legs and underarms) for at least 48 hours prior to the first CHG shower.  You may shave your face/neck.   Please follow these instructions carefully:             1.  Shower with CHG Soap the night before surgery and the  morning of surgery.             2.  If you choose to wash your hair, wash your hair first as usual  with your normal  shampoo.             3.  After you shampoo, rinse your hair and body thoroughly to remove the shampoo.                                              4.  Use CHG as you would any other liquid  soap.  You can apply chg directly to the skin and wash.  Gently with a scrungie or clean washcloth.             5.  Apply the CHG Soap to your body ONLY FROM THE NECK DOWN.   Do not use on face/ open                           Wound or open sores. Avoid contact with eyes, ears mouth and genitals (private parts).                       Wash face,  Genitals (private parts) with your normal soap.             6.  Wash thoroughly, paying special attention to the area where your surgery  will be performed.             7.  Thoroughly rinse your body with warm water from the neck down.             8.  DO NOT shower/wash with your normal soap after using and rinsing off the CHG Soap.            9.  Pat yourself dry with a clean towel.            10.  Wear clean pajamas.            11.  Place clean sheets on your bed the night of your first shower and do not  sleep with pets.   ON THE DAY OF SURGERY : Do not apply any lotions/deodorants the morning of surgery.  Please wear clean clothes to the hospital/surgery center.         FAILURE TO FOLLOW THESE INSTRUCTIONS MAY RESULT IN THE CANCELLATION OF YOUR SURGERY   PATIENT SIGNATURE_________________________________   NURSE SIGNATURE__________________________________   ________________________________________________________________________            Caroline Valdez      An incentive spirometer is a tool that can help keep your lungs clear and active. This tool measures how well you are filling your lungs with each breath. Taking long deep breaths may help reverse or decrease the chance of developing breathing (pulmonary) problems (especially infection) following: A long period of time when you are unable to move or be active. BEFORE THE PROCEDURE  If the spirometer includes an indicator to show your best effort, your nurse or respiratory therapist will set it to a desired goal. If possible, sit up straight or lean slightly forward. Try not  to slouch. Hold the incentive spirometer in an upright position. INSTRUCTIONS FOR USE  Sit on the edge of your bed if possible, or sit up as far as you can in bed or on a chair. Hold the incentive spirometer in an upright position. Breathe out normally. Place the mouthpiece in your mouth and seal your lips tightly  around it. Breathe in slowly and as deeply as possible, raising the piston or the ball toward the top of the column. Hold your breath for 3-5 seconds or for as long as possible. Allow the piston or ball to fall to the bottom of the column. Remove the mouthpiece from your mouth and breathe out normally. Rest for a few seconds and repeat Steps 1 through 7 at least 10 times every 1-2 hours when you are awake. Take your time and take a few normal breaths between deep breaths. The spirometer may include an indicator to show your best effort. Use the indicator as a goal to work toward during each repetition. After each set of 10 deep breaths, practice coughing to be sure your lungs are clear. If you have an incision (the cut made at the time of surgery), support your incision when coughing by placing a pillow or rolled up towels firmly against it. Once you are able to get out of bed, walk around indoors and cough well. You may stop using the incentive spirometer when instructed by your caregiver.  RISKS AND COMPLICATIONS Take your time so you do not get dizzy or light-headed. If you are in pain, you may need to take or ask for pain medication before doing incentive spirometry. It is harder to take a deep breath if you are having pain. AFTER USE Rest and breathe slowly and easily. It can be helpful to keep track of a log of your progress. Your caregiver can provide you with a simple table to help with this. If you are using the spirometer at home, follow these instructions: SEEK MEDICAL CARE IF:  You are having difficultly using the spirometer. You have trouble using the spirometer as often  as instructed. Your pain medication is not giving enough relief while using the spirometer. You develop fever of 100.5 F (38.1 C) or higher.                                                                                                    SEEK IMMEDIATE MEDICAL CARE IF:  You cough up bloody sputum that had not been present before. You develop fever of 102 F (38.9 C) or greater. You develop worsening pain at or near the incision site. MAKE SURE YOU:  Understand these instructions. Will watch your condition. Will get help right away if you are not doing well or get worse. Document Released: 09/17/2006 Document Revised: 07/30/2011 Document Reviewed: 11/18/2006 [facility] Patient Information 2014 [place], [place].

## 2023-08-08 NOTE — Progress Notes (Signed)
 COVID Vaccine received:  []  No [x]  Yes Date of any COVID positive Test in last 90 days: none   PCP - Lynnea Ferrier, MD  clearance scanned to Media Cardiologist - none Hematology- Doreatha Massed, MD Clearance scanned to Media   PATIENT RESTRICTS HER LEFT ARM d/t Breast cancer/ mastectomy   Chest x-ray - 11-26-2022   2v  Epic EKG -06-15-2019  Epic    will repeat   Stress Test -  ECHO -10-22-2012  Epic  Cardiac Cath -    PCR screen: []  Ordered & Completed []   No Order but Needs PROFEND     [x]   N/A for this surgery   Surgery Plan:  [x]  Ambulatory   []  Outpatient in bed  []  Admit Anesthesia:    []  General  []  Spinal  [x]   Choice []   MAC   Pacemaker / ICD device [x]  No []  Yes   Spinal Cord Stimulator:[x]  No []  Yes       History of Sleep Apnea? [x]  No []  Yes   CPAP used?- [x]  No []  Yes     Does the patient monitor blood sugar?   [x]  N/A   []  No []  Yes  Patient has: [x]  NO Hx DM   []  Pre-DM   []  DM1  []   DM2   Blood Thinner / Instructions:  None Aspirin Instructions:  none  patient has Von Willebrand factor   ERAS Protocol Ordered: []  No  [x]  Yes PRE-SURGERY [x]  ENSURE  []  G2  Patient is to be NPO after: 05:30   Dental hx: []  Dentures:  [x]  N/A      []  Bridge or Partial:                   []  Loose or Damaged teeth:    Activity level: Patient is able to climb a flight of stairs without difficulty; [x]  No CP  [x]  No SOB.  Patient can perform ADLs without assistance.    Anesthesia review: Von Willebrand disease, GERD, Hx TIA 2014   Patient denies shortness of breath, fever, cough and chest pain at PAT appointment.   Patient verbalized understanding and agreement to the Pre-Surgical Instructions that were given to them at this PAT appointment. Patient was also educated of the need to review these PAT instructions again prior to her surgery.I reviewed the appropriate phone numbers to call if they have any and questions or concerns.      Electronically signed by Sharee Pimple, RN at 07/05/2023  3:59 PM

## 2023-08-09 ENCOUNTER — Other Ambulatory Visit: Payer: Self-pay

## 2023-08-09 ENCOUNTER — Encounter (HOSPITAL_COMMUNITY)
Admission: RE | Admit: 2023-08-09 | Discharge: 2023-08-09 | Disposition: A | Discharge status: Home / Self Care | Source: Ambulatory Visit | Attending: Specialist | Admitting: Specialist

## 2023-08-09 ENCOUNTER — Encounter (HOSPITAL_COMMUNITY): Payer: Self-pay

## 2023-08-09 VITALS — BP 144/78 | HR 88 | Temp 98.4°F | Resp 14 | Ht 65.0 in | Wt 132.0 lb

## 2023-08-09 DIAGNOSIS — M75101 Unspecified rotator cuff tear or rupture of right shoulder, not specified as traumatic: Secondary | ICD-10-CM | POA: Insufficient documentation

## 2023-08-09 DIAGNOSIS — Z01812 Encounter for preprocedural laboratory examination: Secondary | ICD-10-CM | POA: Diagnosis not present

## 2023-08-09 DIAGNOSIS — G8929 Other chronic pain: Secondary | ICD-10-CM | POA: Diagnosis not present

## 2023-08-09 DIAGNOSIS — Z01818 Encounter for other preprocedural examination: Secondary | ICD-10-CM

## 2023-08-09 DIAGNOSIS — I10 Essential (primary) hypertension: Secondary | ICD-10-CM | POA: Insufficient documentation

## 2023-08-09 DIAGNOSIS — E785 Hyperlipidemia, unspecified: Secondary | ICD-10-CM | POA: Diagnosis not present

## 2023-08-09 DIAGNOSIS — M25511 Pain in right shoulder: Secondary | ICD-10-CM | POA: Insufficient documentation

## 2023-08-09 DIAGNOSIS — D6801 Von willebrand disease, type 1: Secondary | ICD-10-CM | POA: Insufficient documentation

## 2023-08-09 LAB — BASIC METABOLIC PANEL
Anion gap: 7 (ref 5–15)
BUN: 16 mg/dL (ref 8–23)
CO2: 27 mmol/L (ref 22–32)
Calcium: 9.3 mg/dL (ref 8.9–10.3)
Chloride: 104 mmol/L (ref 98–111)
Creatinine, Ser: 0.72 mg/dL (ref 0.44–1.00)
GFR, Estimated: >60 mL/min (ref >60)
Glucose, Bld: 92 mg/dL (ref 70–99)
Potassium: 3.5 mmol/L (ref 3.5–5.1)
Sodium: 138 mmol/L (ref 135–145)

## 2023-08-09 LAB — CBC
HCT: 42 % (ref 36.0–46.0)
Hemoglobin: 13.9 g/dL (ref 12.0–15.0)
MCH: 31.2 pg (ref 26.0–34.0)
MCHC: 33.1 g/dL (ref 30.0–36.0)
MCV: 94.4 fL (ref 80.0–100.0)
Platelets: 209 10*3/uL (ref 150–400)
RBC: 4.45 MIL/uL (ref 3.87–5.11)
RDW: 13.1 % (ref 11.5–15.5)
WBC: 5.4 10*3/uL (ref 4.0–10.5)
nRBC: 0 % (ref 0.0–0.2)

## 2023-08-09 NOTE — Progress Notes (Signed)
 Anesthesia Chart Review   Case: [id number] Date/Time: 08/14/23 1023   Procedure: ARTHROSCOPY, SHOULDER, WITH ROTATOR CUFF REPAIR (Right) - Revision right shoulder mini open rotator cuff repair with patch graft   Anesthesia type: Choice   Diagnosis: Chronic right shoulder pain [M25.511, G89.29]   Pre-op diagnosis: Recurrent right rotator cuff tear   Location: [facility] ROOM 08 / [facility]   Surgeons: Jene Every, MD       DISCUSSION:73 y.o. never smoker with h/o HTN, Von Willebrand disease, recurrent right rotator cuff tear scheduled for above procedure 08/14/2023 with Dr. Jene Every.   Surgery rescheduled from 2/19 due to URI.  Sx have resolved.   Pt evaluated by hematolgy due to Von Willebrand disease.  Clearance received from Dr. Ellin Saba which states pt is to receive DDAVP 0.3mg /kg IV over 15-30 minutes, 30-60 minutes prior to surgery.    Message sent to Illene Bolus, [place] and spoke with Rosalva Ferron at Dr. Ermelinda Das office regarding these orders.  VS: BP (!) 144/78 Comment: left arm sitting  Pulse 88   Temp 36.9 C (Oral)   Resp 14   Ht 5\' 5"  (1.651 m)   Wt 59.9 kg   SpO2 99%   BMI 21.97 kg/m   PROVIDERS: Donita Brooks, MD is PCP    LABS: Labs reviewed: Acceptable for surgery. (all labs ordered are listed, but only abnormal results are displayed)  Labs Reviewed  BASIC METABOLIC PANEL  CBC     IMAGES:   EKG:   CV:  Past Medical History:  Diagnosis Date   Arthritis    Blood dyscrasia    Von Willebrand factor   Breast cancer (HCC)    left   Complication of anesthesia    1980S  TUBAL LIG, + HEMM SURGERY PT PASSED OUT AND WENT INTO SHOCK.  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    Endometriosis    Fibromyalgia    GERD (gastroesophageal reflux disease)    Hypercholesteremia    Hypertension    Mini stroke 09/2012   Stroke [facility])    MINI STROKE   Von Willebrand disease (HCC) 09/11/2013    Past Surgical History:  Procedure  Laterality Date   ABDOMINAL HYSTERECTOMY     AUGMENTATION MAMMAPLASTY Right    BACK SURGERY     BREAST IMPLANT REMOVAL Right 12/05/2017   BREAST IMPLANT REMOVAL Right 12/05/2017   Procedure: REMOVAL RIGHT  BREAST IMPLANT MATERIAL;  Surgeon: Etter Sjogren, MD;  Location: [facility] OR;  Service: Plastics;  Laterality: Right;   BREAST RECONSTRUCTION     BREAST RECONSTRUCTION Right 12/05/2017   Procedure: DELAYED BREAST RECONSTRUCTION WITH SILICONE IMPLANT;  Surgeon: Etter Sjogren, MD;  Location: [facility] OR;  Service: Plastics;  Laterality: Right;   CARPAL TUNNEL RELEASE     right   COLONOSCOPY  01/2010   Dr. Franky Macho: normal   COLONOSCOPY WITH PROPOFOL N/A 01/22/2019   Procedure: COLONOSCOPY WITH PROPOFOL;  Surgeon: Corbin Ade, MD;  Location[facility];  Service: Endoscopy;  Laterality: N/A;  2:30pm - can not come earlier due to transportation   FINGER SURGERY     LEFT THUMB   HEMORRHOID SURGERY     X2   LAMINECTOMY     MASTECTOMY     left   SHOULDER SURGERY Right    SKIN CANCER EXCISION     nasal basal cell lesion,    Mohs surgery   TONSILLECTOMY     TUBAL LIGATION  MEDICATIONS:  acetaminophen (TYLENOL) 500 MG tablet   ALPRAZolam (XANAX) 0.5 MG tablet   aspirin EC 81 MG tablet   Azelastine-Fluticasone (DYMISTA) 137-50 MCG/ACT SUSP   chlorpheniramine (ALLERGY RELIEF) 4 MG tablet   famotidine (PEPCID) 20 MG tablet   montelukast (SINGULAIR) 10 MG tablet   Multiple Vitamin (MULTIVITAMIN WITH MINERALS) TABS tablet   Multiple Vitamins-Minerals (EQ VISION FORMULA 50+ PO)   ondansetron (ZOFRAN) 4 MG tablet   pantoprazole (PROTONIX) 40 MG tablet   Plecanatide (TRULANCE) 3 MG TABS   rosuvastatin (CRESTOR) 20 MG tablet   traMADol (ULTRAM) 50 MG tablet   No current facility-administered medications for this encounter.    Jodell Cipro Ward, PA-C [facility] Pre-Surgical Testing [phone]

## 2023-08-13 ENCOUNTER — Ambulatory Visit: Payer: Self-pay | Admitting: Orthopedic Surgery

## 2023-08-13 NOTE — Anesthesia Preprocedure Evaluation (Signed)
 Anesthesia Evaluation    Airway        Dental   Pulmonary           Cardiovascular hypertension,      Neuro/Psych    GI/Hepatic   Endo/Other    Renal/GU      Musculoskeletal   Abdominal   Peds  Hematology   Anesthesia Other Findings   Reproductive/Obstetrics                             Anesthesia Physical Anesthesia Plan  ASA:   Anesthesia Plan:    Post-op Pain Management:    Induction:   PONV Risk Score and Plan:   Airway Management Planned:   Additional Equipment:   Intra-op Plan:   Post-operative Plan:   Informed Consent:   Plan Discussed with:   Anesthesia Plan Comments: (See PAT note 08/09/23)       Anesthesia Quick Evaluation

## 2023-08-14 ENCOUNTER — Encounter (HOSPITAL_COMMUNITY): Payer: Self-pay | Admitting: Physician Assistant

## 2023-08-14 ENCOUNTER — Encounter (HOSPITAL_COMMUNITY): Admission: RE | Payer: Self-pay | Source: Home / Self Care

## 2023-08-14 ENCOUNTER — Ambulatory Visit (HOSPITAL_COMMUNITY): Admission: RE | Admit: 2023-08-14 | Source: Home / Self Care | Admitting: Specialist

## 2023-08-14 SURGERY — ARTHROSCOPY, SHOULDER, WITH ROTATOR CUFF REPAIR
Anesthesia: Choice | Laterality: Right

## 2023-08-16 ENCOUNTER — Ambulatory Visit: Payer: Self-pay | Admitting: Orthopedic Surgery

## 2023-08-19 ENCOUNTER — Ambulatory Visit: Payer: Self-pay | Admitting: Orthopedic Surgery

## 2023-08-19 NOTE — H&P (View-Only) (Signed)
 Caroline Valdez is an 73 y.o. female.   Chief Complaint: right shoulder pain HPI: Visit For: New patient Hand Dominance: right Location: right; shoulder Duration: 5+ months Quality: aching; no change Severity: moderate; pain level 7/10 Context: felt an onset of pain after reaching back in the car to hand her granddaughter her backpack Alleviating Factors: rest; limited weight bearing; narcotics (tramadol - PCP) Aggravating Factors: lifting; ROM; weightbearing Associated Symptoms: numbness Previous Surgery: date: (2008 - right shoulder scope in [place] - unsure of surgeon or practice) Prior Imaging: MRI [employer] [facility]) Previous Injections: did not help; right shoulder cortisone injection with PCP x1 month ago - no relief Previous PT: none She was seen by Dr. Tanya Nones she had an MRI of her shoulder which demonstrated over 70% tear of the rotator cuff. She has had a history of rotator cuff repair over 10 years ago. Also has a history of von Willebrand's disease. She has had an injection which did not help.       Past Medical History:  Diagnosis Date   Arthritis     Blood dyscrasia      Von Willebrand factor   Breast cancer (HCC)      left   Complication of anesthesia      1980S  TUBAL LIG, + HEMM SURGERY PT PASSED OUT AND WENT INTO SHOCK.  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     Endometriosis     Fibromyalgia     GERD (gastroesophageal reflux disease)     Hypercholesteremia     Hypertension     Mini stroke 09/2012   Stroke [facility])      MINI STROKE   Von Willebrand disease (HCC) 09/11/2013               Past Surgical History:  Procedure Laterality Date   ABDOMINAL HYSTERECTOMY       AUGMENTATION MAMMAPLASTY Right     BACK SURGERY       BREAST IMPLANT REMOVAL Right 12/05/2017   BREAST IMPLANT REMOVAL Right 12/05/2017    Procedure: REMOVAL RIGHT  BREAST IMPLANT MATERIAL;  Surgeon: Etter Sjogren, MD;  Location: [facility] OR;   Service: Plastics;  Laterality: Right;   BREAST RECONSTRUCTION       BREAST RECONSTRUCTION Right 12/05/2017    Procedure: DELAYED BREAST RECONSTRUCTION WITH SILICONE IMPLANT;  Surgeon: Etter Sjogren, MD;  Location: [facility] OR;  Service: Plastics;  Laterality: Right;   CARPAL TUNNEL RELEASE        right   COLONOSCOPY   01/2010    Dr. Franky Macho: normal   COLONOSCOPY WITH PROPOFOL N/A 01/22/2019    Procedure: COLONOSCOPY WITH PROPOFOL;  Surgeon: Corbin Ade, MD;  Location: [facility];  Service: Endoscopy;  Laterality: N/A;  2:30pm - can not come earlier due to transportation   FINGER SURGERY        LEFT THUMB   HEMORRHOID SURGERY        X2   LAMINECTOMY       MASTECTOMY        left   SHOULDER SURGERY Right     SKIN CANCER EXCISION        nasal basal cell lesion,    Mohs surgery   TONSILLECTOMY       TUBAL LIGATION                   Family History  Problem Relation Age of Onset  Breast cancer Mother 12        now 51yo   Stroke Father          Father had no full sisters   Breast cancer Sister 92        now 66yo; had TAH/BSO   Lung cancer Brother          died 99yo; smoker        Social History:  reports that she has never smoked. She has never used smokeless tobacco. She reports that she does not drink alcohol and does not use drugs.   Allergies:  Allergies       Allergies  Allergen Reactions   Aspirin Other (See Comments)      Pt has Von Willebrands Disease   Hydrocodone Itching   Oxycodone Itching   Tape Dermatitis      Pulls off skin        Current meds: acetaminophen 1,000 mg-DM 30 mg oral powder packet Adult Low Dose Aspirin 81 mg tablet,delayed release ALPRAZolam 0.5 mg tablet azelastine-fluticasone chlorpheniramine 2 mg-DM 10 mg-acetaminophen 325 mg tablet montelukast 10 mg tablet ondansetron 4 mg disintegrating tablet One Daily Multivitamins with Minerals 4.5 mg iron tablet pantoprazole 40 mg tablet,delayed release Pepcid 20 mg  tablet rosuvastatin 20 mg tablet traMADoL 50 mg tablet Trulance 3 mg tablet Vision Formula (with lutein)   Review of Systems  Constitutional: Negative.   HENT: Negative.    Eyes: Negative.   Respiratory: Negative.    Cardiovascular: Negative.   Gastrointestinal: Negative.   Endocrine: Negative.   Genitourinary: Negative.   Musculoskeletal:  Positive for arthralgias and joint swelling.  Skin: Negative.   Neurological: Negative.   Psychiatric/Behavioral: Negative.        There were no vitals taken for this visit. Physical Exam Constitutional:      Appearance: Normal appearance.  HENT:     Head: Normocephalic and atraumatic.     Right Ear: External ear normal.     Left Ear: External ear normal.     Nose: Nose normal.     Mouth/Throat:     Pharynx: Oropharynx is clear.  Eyes:     Conjunctiva/sclera: Conjunctivae normal.  Cardiovascular:     Rate and Rhythm: Normal rate and regular rhythm.     Pulses: Normal pulses.     Heart sounds: Normal heart sounds.  Pulmonary:     Effort: Pulmonary effort is normal.     Breath sounds: Normal breath sounds.  Abdominal:     General: Bowel sounds are normal.  Musculoskeletal:     Cervical back: Normal range of motion.  Skin:    General: Skin is warm and dry.  Neurological:     Mental Status: She is alert.    Constitutional: General Appearance: healthy-appearing and NAD.   Psychiatric: Mood and Affect: normal mood and affect.   Cardiovascular System: Arterial Pulses Right: radial normal and brachial normal. Varicosities Right: no varicosities.   C-Spine/Neck: Active Range of Motion: flexion normal, extension normal, and no pain elicited on motion.   Shoulders: Inspection Right: no misalignment, atrophy, erythema, swelling, or scapular winging. Bony Palpation Right: no tenderness of the sternoclavicular joint, the coracoid process, the acromioclavicular joint, the bicipital groove, or the scapula. Soft Tissue Palpation Right:  tenderness of the supraspinatus and the subacromial bursa. Active Range of Motion Right: limited. Special Tests Right: Speed's test negative and Neer's test positive. Stability Right: no laxity, sulcus sign negative, and anterior apprehension test negative. Strength Right: abduction  5/5, adduction 5/5, flexion 5/5, and extension 5/5.   Skin: Right Upper Extremity: normal.   Neurological System: Biceps Reflex Right: normal (2). Brachioradialis Reflex Right: normal (2). Triceps Reflex Right: normal (2). Sensation on the Right: C5 normal, C6 normal, and C7 normal.   We can external rotation tender in the anterior subacromial region nontender to the [facility]     Three-view x-rays of the shoulder demonstrate a previous distal clavicle resection the lateral sloping acromion   Outside MRI demonstrates a 70 to 80% high-grade tear of the supraspinatus. Previous distal clavicle resection.   Assessment/Plan Impression:   1. Refractory symptomatic high-grade recurrent tear of the supraspinatus 2. History of rotator cuff repair distal clavicle resection 3. Von Willebrand's disease   Plan:   We discussed options given her persistent symptoms despite rest activity modification cortisone injection we discussed revision repair with likely use of the patch graft   An extensive discussion concerning the pathology relevant anatomy and treatment options. After that discussion we mutually agreed to proceed with repair of the rotator cuff utilizing arthroscopic assistance if possible. The risks and benefits of that procedure were discussed including bleeding, infection, suboptimal range of motion, deep venous thrombosis, pulmonary embolism, anesthetic complications etc. in addition we discussed the postoperative course to include approximately 4 weeks of passive range of motion followed by 4 weeks of active range of motion followed by 4-12 weeks of progressive strengthening exercises. In addition we discussed protective  activities to reduce the risk of a reinjury including impingement activities with elbow above the shoulder as well as reaching and repetitive circular motion activities. The hospital stay will either be as a outpatient with a regional block versus overnight depending upon the extent of the procedure and any challenging health issues with a first postoperative visit 2 weeks following the surgery.     No history of DVT or MRSA. Able to take aspirin. She does have von Willebrand's. In the past she has had to take IV desmopressin 30 minutes preoperatively to boost the levels of the bone but Willebrand's factor. Will obtain clearances from her medical doctor and hopefully from her hematologist.   Appreciate the kind referral by Dr. Tanya Nones. We discussed utilizing a patch graft   Plan revision mini-open RCR right shoulder with patch graft   Dorothy Spark, PA-C for Dr Shelle Iron 08/19/23

## 2023-08-19 NOTE — H&P (Signed)
 Caroline Valdez is an 73 y.o. female.   Chief Complaint: right shoulder pain HPI: Visit For: New patient Hand Dominance: right Location: right; shoulder Duration: 5+ months Quality: aching; no change Severity: moderate; pain level 7/10 Context: felt an onset of pain after reaching back in the car to hand her granddaughter her backpack Alleviating Factors: rest; limited weight bearing; narcotics (tramadol - PCP) Aggravating Factors: lifting; ROM; weightbearing Associated Symptoms: numbness Previous Surgery: date: (2008 - right shoulder scope in [place] - unsure of surgeon or practice) Prior Imaging: MRI [employer] [facility]) Previous Injections: did not help; right shoulder cortisone injection with PCP x1 month ago - no relief Previous PT: none She was seen by Dr. Tanya Nones she had an MRI of her shoulder which demonstrated over 70% tear of the rotator cuff. She has had a history of rotator cuff repair over 10 years ago. Also has a history of von Willebrand's disease. She has had an injection which did not help.       Past Medical History:  Diagnosis Date   Arthritis     Blood dyscrasia      Von Willebrand factor   Breast cancer (HCC)      left   Complication of anesthesia      1980S  TUBAL LIG, + HEMM SURGERY PT PASSED OUT AND WENT INTO SHOCK.  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     Endometriosis     Fibromyalgia     GERD (gastroesophageal reflux disease)     Hypercholesteremia     Hypertension     Mini stroke 09/2012   Stroke [facility])      MINI STROKE   Von Willebrand disease (HCC) 09/11/2013               Past Surgical History:  Procedure Laterality Date   ABDOMINAL HYSTERECTOMY       AUGMENTATION MAMMAPLASTY Right     BACK SURGERY       BREAST IMPLANT REMOVAL Right 12/05/2017   BREAST IMPLANT REMOVAL Right 12/05/2017    Procedure: REMOVAL RIGHT  BREAST IMPLANT MATERIAL;  Surgeon: Etter Sjogren, MD;  Location: [facility] [place] OR;   Service: Plastics;  Laterality: Right;   BREAST RECONSTRUCTION       BREAST RECONSTRUCTION Right 12/05/2017    Procedure: DELAYED BREAST RECONSTRUCTION WITH SILICONE IMPLANT;  Surgeon: Etter Sjogren, MD;  Location: [facility] OR;  Service: Plastics;  Laterality: Right;   CARPAL TUNNEL RELEASE        right   COLONOSCOPY   01/2010    Dr. Franky Macho: normal   COLONOSCOPY WITH PROPOFOL N/A 01/22/2019    Procedure: COLONOSCOPY WITH PROPOFOL;  Surgeon: Corbin Ade, MD;  Location: [facility];  Service: Endoscopy;  Laterality: N/A;  2:30pm - can not come earlier due to transportation   FINGER SURGERY        LEFT THUMB   HEMORRHOID SURGERY        X2   LAMINECTOMY       MASTECTOMY        left   SHOULDER SURGERY Right     SKIN CANCER EXCISION        nasal basal cell lesion,    Mohs surgery   TONSILLECTOMY       TUBAL LIGATION                   Family History  Problem Relation Age of Onset  Breast cancer Mother 12        now 51yo   Stroke Father          Father had no full sisters   Breast cancer Sister 92        now 66yo; had TAH/BSO   Lung cancer Brother          died 99yo; smoker        Social History:  reports that she has never smoked. She has never used smokeless tobacco. She reports that she does not drink alcohol and does not use drugs.   Allergies:  Allergies       Allergies  Allergen Reactions   Aspirin Other (See Comments)      Pt has Von Willebrands Disease   Hydrocodone Itching   Oxycodone Itching   Tape Dermatitis      Pulls off skin        Current meds: acetaminophen 1,000 mg-DM 30 mg oral powder packet Adult Low Dose Aspirin 81 mg tablet,delayed release ALPRAZolam 0.5 mg tablet azelastine-fluticasone chlorpheniramine 2 mg-DM 10 mg-acetaminophen 325 mg tablet montelukast 10 mg tablet ondansetron 4 mg disintegrating tablet One Daily Multivitamins with Minerals 4.5 mg iron tablet pantoprazole 40 mg tablet,delayed release Pepcid 20 mg  tablet rosuvastatin 20 mg tablet traMADoL 50 mg tablet Trulance 3 mg tablet Vision Formula (with lutein)   Review of Systems  Constitutional: Negative.   HENT: Negative.    Eyes: Negative.   Respiratory: Negative.    Cardiovascular: Negative.   Gastrointestinal: Negative.   Endocrine: Negative.   Genitourinary: Negative.   Musculoskeletal:  Positive for arthralgias and joint swelling.  Skin: Negative.   Neurological: Negative.   Psychiatric/Behavioral: Negative.        There were no vitals taken for this visit. Physical Exam Constitutional:      Appearance: Normal appearance.  HENT:     Head: Normocephalic and atraumatic.     Right Ear: External ear normal.     Left Ear: External ear normal.     Nose: Nose normal.     Mouth/Throat:     Pharynx: Oropharynx is clear.  Eyes:     Conjunctiva/sclera: Conjunctivae normal.  Cardiovascular:     Rate and Rhythm: Normal rate and regular rhythm.     Pulses: Normal pulses.     Heart sounds: Normal heart sounds.  Pulmonary:     Effort: Pulmonary effort is normal.     Breath sounds: Normal breath sounds.  Abdominal:     General: Bowel sounds are normal.  Musculoskeletal:     Cervical back: Normal range of motion.  Skin:    General: Skin is warm and dry.  Neurological:     Mental Status: She is alert.    Constitutional: General Appearance: healthy-appearing and NAD.   Psychiatric: Mood and Affect: normal mood and affect.   Cardiovascular System: Arterial Pulses Right: radial normal and brachial normal. Varicosities Right: no varicosities.   C-Spine/Neck: Active Range of Motion: flexion normal, extension normal, and no pain elicited on motion.   Shoulders: Inspection Right: no misalignment, atrophy, erythema, swelling, or scapular winging. Bony Palpation Right: no tenderness of the sternoclavicular joint, the coracoid process, the acromioclavicular joint, the bicipital groove, or the scapula. Soft Tissue Palpation Right:  tenderness of the supraspinatus and the subacromial bursa. Active Range of Motion Right: limited. Special Tests Right: Speed's test negative and Neer's test positive. Stability Right: no laxity, sulcus sign negative, and anterior apprehension test negative. Strength Right: abduction  5/5, adduction 5/5, flexion 5/5, and extension 5/5.   Skin: Right Upper Extremity: normal.   Neurological System: Biceps Reflex Right: normal (2). Brachioradialis Reflex Right: normal (2). Triceps Reflex Right: normal (2). Sensation on the Right: C5 normal, C6 normal, and C7 normal.   We can external rotation tender in the anterior subacromial region nontender to the [facility]     Three-view x-rays of the shoulder demonstrate a previous distal clavicle resection the lateral sloping acromion   Outside MRI demonstrates a 70 to 80% high-grade tear of the supraspinatus. Previous distal clavicle resection.   Assessment/Plan Impression:   1. Refractory symptomatic high-grade recurrent tear of the supraspinatus 2. History of rotator cuff repair distal clavicle resection 3. Von Willebrand's disease   Plan:   We discussed options given her persistent symptoms despite rest activity modification cortisone injection we discussed revision repair with likely use of the patch graft   An extensive discussion concerning the pathology relevant anatomy and treatment options. After that discussion we mutually agreed to proceed with repair of the rotator cuff utilizing arthroscopic assistance if possible. The risks and benefits of that procedure were discussed including bleeding, infection, suboptimal range of motion, deep venous thrombosis, pulmonary embolism, anesthetic complications etc. in addition we discussed the postoperative course to include approximately 4 weeks of passive range of motion followed by 4 weeks of active range of motion followed by 4-12 weeks of progressive strengthening exercises. In addition we discussed protective  activities to reduce the risk of a reinjury including impingement activities with elbow above the shoulder as well as reaching and repetitive circular motion activities. The hospital stay will either be as a outpatient with a regional block versus overnight depending upon the extent of the procedure and any challenging health issues with a first postoperative visit 2 weeks following the surgery.     No history of DVT or MRSA. Able to take aspirin. She does have von Willebrand's. In the past she has had to take IV desmopressin 30 minutes preoperatively to boost the levels of the bone but Willebrand's factor. Will obtain clearances from her medical doctor and hopefully from her hematologist.   Appreciate the kind referral by Dr. Tanya Nones. We discussed utilizing a patch graft   Plan revision mini-open RCR right shoulder with patch graft   Dorothy Spark, PA-C for Dr Shelle Iron 08/19/23

## 2023-08-22 NOTE — Progress Notes (Signed)
 Patient phoned to give updated information on surgery.  Surgery was rescheduled due to physician being ill.    Date of Surgery - 08-28-23  Arrival Time - 9:30 AM and check in at admitting.    NPO Status - patient reminded to not eat solid food after midnight.  From midnight until 8:30 AM the day of surgery may have clear liquids  Medications morning of surgery - Pantoprazole, Tylenol or Tramadol if needed.  Okay to use nasal spray  No change in medical history, allergies per patient.  Transportation home - [facility] [phone]  All questions answered and patient stated understanding

## 2023-08-28 ENCOUNTER — Ambulatory Visit: Payer: Self-pay | Admitting: Orthopedic Surgery

## 2023-08-28 ENCOUNTER — Ambulatory Visit (HOSPITAL_COMMUNITY): Admitting: Anesthesiology

## 2023-08-28 ENCOUNTER — Ambulatory Visit (HOSPITAL_COMMUNITY)
Admission: RE | Admit: 2023-08-28 | Discharge: 2023-08-28 | Disposition: A | Discharge status: Home / Self Care | Attending: Specialist | Admitting: Specialist

## 2023-08-28 ENCOUNTER — Encounter (HOSPITAL_COMMUNITY): Payer: Self-pay | Admitting: Specialist

## 2023-08-28 ENCOUNTER — Encounter (HOSPITAL_COMMUNITY)
Admission: RE | Disposition: A | Discharge status: Home / Self Care | Payer: Self-pay | Source: Home / Self Care | Attending: Specialist

## 2023-08-28 ENCOUNTER — Ambulatory Visit: Payer: Self-pay | Admitting: Specialist

## 2023-08-28 ENCOUNTER — Other Ambulatory Visit: Payer: Self-pay

## 2023-08-28 DIAGNOSIS — E7800 Pure hypercholesterolemia, unspecified: Secondary | ICD-10-CM | POA: Diagnosis not present

## 2023-08-28 DIAGNOSIS — S46011A Strain of muscle(s) and tendon(s) of the rotator cuff of right shoulder, initial encounter: Secondary | ICD-10-CM | POA: Insufficient documentation

## 2023-08-28 DIAGNOSIS — K219 Gastro-esophageal reflux disease without esophagitis: Secondary | ICD-10-CM | POA: Insufficient documentation

## 2023-08-28 DIAGNOSIS — X58XXXA Exposure to other specified factors, initial encounter: Secondary | ICD-10-CM | POA: Insufficient documentation

## 2023-08-28 DIAGNOSIS — I639 Cerebral infarction, unspecified: Secondary | ICD-10-CM | POA: Diagnosis not present

## 2023-08-28 DIAGNOSIS — M797 Fibromyalgia: Secondary | ICD-10-CM | POA: Insufficient documentation

## 2023-08-28 DIAGNOSIS — I10 Essential (primary) hypertension: Secondary | ICD-10-CM | POA: Diagnosis not present

## 2023-08-28 DIAGNOSIS — M7551 Bursitis of right shoulder: Secondary | ICD-10-CM | POA: Diagnosis not present

## 2023-08-28 DIAGNOSIS — M25511 Pain in right shoulder: Secondary | ICD-10-CM | POA: Diagnosis present

## 2023-08-28 DIAGNOSIS — D6801 Von willebrand disease, type 1: Secondary | ICD-10-CM | POA: Diagnosis not present

## 2023-08-28 DIAGNOSIS — G8918 Other acute postprocedural pain: Secondary | ICD-10-CM | POA: Diagnosis not present

## 2023-08-28 DIAGNOSIS — M75121 Complete rotator cuff tear or rupture of right shoulder, not specified as traumatic: Secondary | ICD-10-CM | POA: Diagnosis not present

## 2023-08-28 DIAGNOSIS — G8929 Other chronic pain: Secondary | ICD-10-CM | POA: Diagnosis present

## 2023-08-28 DIAGNOSIS — M75101 Unspecified rotator cuff tear or rupture of right shoulder, not specified as traumatic: Secondary | ICD-10-CM | POA: Diagnosis not present

## 2023-08-28 HISTORY — PX: SHOULDER ARTHROSCOPY WITH ROTATOR CUFF REPAIR: SHX5685

## 2023-08-28 SURGERY — ARTHROSCOPY, SHOULDER, WITH ROTATOR CUFF REPAIR
Anesthesia: General | Laterality: Right

## 2023-08-28 MED ORDER — CEFAZOLIN SODIUM-DEXTROSE 2-4 GM/100ML-% IV SOLN
2.0000 g | INTRAVENOUS | Status: AC
  Administered 2023-08-28: 2 g via INTRAVENOUS
  Filled 2023-08-28: qty 100

## 2023-08-28 MED ORDER — ACETAMINOPHEN 500 MG PO TABS: 1000.0000 mg | ORAL_TABLET | Freq: Once | ORAL | Status: DC

## 2023-08-28 MED ORDER — ONDANSETRON HCL 4 MG/2ML IJ SOLN
INTRAMUSCULAR | Status: DC | PRN
  Administered 2023-08-28: 4 mg via INTRAVENOUS

## 2023-08-28 MED ORDER — FENTANYL CITRATE (PF) 100 MCG/2ML IJ SOLN
INTRAMUSCULAR | Status: AC
  Filled 2023-08-28: qty 2

## 2023-08-28 MED ORDER — LIDOCAINE HCL (PF) 2 % IJ SOLN
INTRAMUSCULAR | Status: AC
  Filled 2023-08-28: qty 5

## 2023-08-28 MED ORDER — BUPIVACAINE LIPOSOME 1.3 % IJ SUSP
INTRAMUSCULAR | Status: DC | PRN
  Administered 2023-08-28: 10 mL via PERINEURAL

## 2023-08-28 MED ORDER — PROPOFOL 10 MG/ML IV BOLUS
INTRAVENOUS | Status: AC
  Filled 2023-08-28: qty 20

## 2023-08-28 MED ORDER — CEPHALEXIN 500 MG PO CAPS: 500.0000 mg | ORAL_CAPSULE | Freq: Four times a day (QID) | ORAL | 1 refills | Status: AC

## 2023-08-28 MED ORDER — ROCURONIUM BROMIDE 10 MG/ML (PF) SYRINGE
PREFILLED_SYRINGE | INTRAVENOUS | Status: AC
  Filled 2023-08-28: qty 10

## 2023-08-28 MED ORDER — LACTATED RINGERS IV SOLN: INTRAVENOUS | Status: DC

## 2023-08-28 MED ORDER — LIDOCAINE 2% (20 MG/ML) 5 ML SYRINGE
INTRAMUSCULAR | Status: DC | PRN
Start: 2023-08-28 — End: 2023-08-28
  Administered 2023-08-28: 100 mg via INTRAVENOUS

## 2023-08-28 MED ORDER — BUPIVACAINE-EPINEPHRINE 0.5% -1:200000 IJ SOLN
INTRAMUSCULAR | Status: DC | PRN
  Administered 2023-08-28: 20 mL

## 2023-08-28 MED ORDER — FENTANYL CITRATE (PF) 100 MCG/2ML IJ SOLN
INTRAMUSCULAR | Status: DC | PRN
  Administered 2023-08-28 (×2): 50 ug via INTRAVENOUS

## 2023-08-28 MED ORDER — DEXAMETHASONE SODIUM PHOSPHATE 10 MG/ML IJ SOLN
INTRAMUSCULAR | Status: DC | PRN
  Administered 2023-08-28: 10 mg via INTRAVENOUS

## 2023-08-28 MED ORDER — ONDANSETRON HCL 4 MG/2ML IJ SOLN
INTRAMUSCULAR | Status: AC
  Filled 2023-08-28: qty 2

## 2023-08-28 MED ORDER — MIDAZOLAM HCL 2 MG/2ML IJ SOLN
1.0000 mg | Freq: Once | INTRAMUSCULAR | Status: AC
  Administered 2023-08-28: 1 mg via INTRAVENOUS
  Filled 2023-08-28: qty 2

## 2023-08-28 MED ORDER — SUGAMMADEX SODIUM 200 MG/2ML IV SOLN
INTRAVENOUS | Status: DC | PRN
  Administered 2023-08-28: 150 mg via INTRAVENOUS

## 2023-08-28 MED ORDER — CHLORHEXIDINE GLUCONATE 0.12 % MT SOLN
15.0000 mL | Freq: Once | OROMUCOSAL | Status: AC
  Administered 2023-08-28: 15 mL via OROMUCOSAL

## 2023-08-28 MED ORDER — TRANEXAMIC ACID-NACL 1000-0.7 MG/100ML-% IV SOLN
1000.0000 mg | INTRAVENOUS | Status: AC
  Administered 2023-08-28: 1000 mg via INTRAVENOUS
  Filled 2023-08-28: qty 100

## 2023-08-28 MED ORDER — BUPIVACAINE-EPINEPHRINE (PF) 0.5% -1:200000 IJ SOLN
INTRAMUSCULAR | Status: DC | PRN
Start: 2023-08-28 — End: 2023-08-28
  Administered 2023-08-28: 15 mL via PERINEURAL

## 2023-08-28 MED ORDER — SODIUM CHLORIDE 0.9 % IV SOLN
0.3000 ug/kg | INTRAVENOUS | Status: AC
  Administered 2023-08-28: 18 ug via INTRAVENOUS
  Filled 2023-08-28: qty 4.5

## 2023-08-28 MED ORDER — ROCURONIUM BROMIDE 10 MG/ML (PF) SYRINGE
PREFILLED_SYRINGE | INTRAVENOUS | Status: DC | PRN
  Administered 2023-08-28: 50 mg via INTRAVENOUS

## 2023-08-28 MED ORDER — PROPOFOL 10 MG/ML IV BOLUS
INTRAVENOUS | Status: DC | PRN
  Administered 2023-08-28: 100 mg via INTRAVENOUS
  Administered 2023-08-28 (×2): 50 mg via INTRAVENOUS

## 2023-08-28 MED ORDER — BUPIVACAINE-EPINEPHRINE (PF) 0.5% -1:200000 IJ SOLN
INTRAMUSCULAR | Status: AC
  Filled 2023-08-28: qty 30

## 2023-08-28 MED ORDER — PRONTOSAN WOUND IRRIGATION OPTIME
TOPICAL | Status: DC | PRN
Start: 2023-08-28 — End: 2023-08-28
  Administered 2023-08-28: 350 mL via TOPICAL

## 2023-08-28 MED ORDER — DEXAMETHASONE SODIUM PHOSPHATE 10 MG/ML IJ SOLN
INTRAMUSCULAR | Status: AC
  Filled 2023-08-28: qty 1

## 2023-08-28 MED ORDER — FENTANYL CITRATE PF 50 MCG/ML IJ SOSY
50.0000 ug | PREFILLED_SYRINGE | Freq: Once | INTRAMUSCULAR | Status: AC
  Administered 2023-08-28: 50 ug via INTRAVENOUS
  Filled 2023-08-28: qty 2

## 2023-08-28 MED ORDER — BUPIVACAINE-EPINEPHRINE (PF) 0.5% -1:200000 IJ SOLN
INTRAMUSCULAR | Status: AC
  Filled 2023-08-28: qty 1.8

## 2023-08-28 MED ORDER — OXYCODONE HCL 5 MG PO TABS: 5.0000 mg | ORAL_TABLET | ORAL | 0 refills | Status: DC | PRN

## 2023-08-28 MED ORDER — DOCUSATE SODIUM 100 MG PO CAPS: 100.0000 mg | ORAL_CAPSULE | Freq: Two times a day (BID) | ORAL | 2 refills | Status: DC

## 2023-08-28 MED ORDER — SODIUM CHLORIDE 0.9 % IR SOLN
Status: DC | PRN
  Administered 2023-08-28: 1000 mL

## 2023-08-28 MED ORDER — HYDROMORPHONE HCL 1 MG/ML IJ SOLN: 0.2500 mg | INTRAMUSCULAR | Status: DC | PRN

## 2023-08-28 MED ORDER — DROPERIDOL 2.5 MG/ML IJ SOLN: 0.6250 mg | Freq: Once | INTRAMUSCULAR | Status: DC | PRN

## 2023-08-28 MED ORDER — POLYETHYLENE GLYCOL 3350 17 G PO PACK: 17.0000 g | PACK | Freq: Every day | ORAL | 0 refills | Status: DC

## 2023-08-28 MED ORDER — METHOCARBAMOL 500 MG PO TABS: 500.0000 mg | ORAL_TABLET | Freq: Three times a day (TID) | ORAL | 1 refills | Status: DC | PRN

## 2023-08-28 SURGICAL SUPPLY — 63 items
ANCHOR NDL 9/16 CIR SZ 8 (NEEDLE) IMPLANT
ANCHOR NEEDLE 9/16 CIR SZ 8 (NEEDLE) IMPLANT
ANCHOR SWIVELOCK BIO 4.75X19.1 (Anchor) IMPLANT
BAG COUNTER SPONGE SURGICOUNT (BAG) IMPLANT
BLADE SHAVER TORPEDO 4X13 (MISCELLANEOUS) ×2 IMPLANT
BLADE SURG SZ11 CARB STEEL (BLADE) ×2 IMPLANT
BURR OVAL 8 FLU 4.0X13 (MISCELLANEOUS) IMPLANT
CANNULA 5.75X7 CRYSTAL CLEAR (CANNULA) IMPLANT
CANNULA ACUFO 5X76 (CANNULA) IMPLANT
CLEANER TIP ELECTROSURG 2X2 (MISCELLANEOUS) IMPLANT
COVER SURGICAL LIGHT HANDLE (MISCELLANEOUS) ×2 IMPLANT
DRAPE FOOT SWITCH (DRAPES) ×4 IMPLANT
DRAPE IMP U-DRAPE 54X76 (DRAPES) ×2 IMPLANT
DRAPE POUCH INSTRU U-SHP 10X18 (DRAPES) ×2 IMPLANT
DRAPE STERI 35X30 U-POUCH (DRAPES) ×2 IMPLANT
DRAPE SURG ORHT 6 SPLT 77X108 (DRAPES) ×4 IMPLANT
DRSG AQUACEL AG ADV 3.5X 4 (GAUZE/BANDAGES/DRESSINGS) IMPLANT
DRSG AQUACEL AG ADV 3.5X 6 (GAUZE/BANDAGES/DRESSINGS) IMPLANT
DURAPREP 26ML APPLICATOR (WOUND CARE) ×2 IMPLANT
DW OUTFLOW CASSETTE/TUBE SET (MISCELLANEOUS) ×2 IMPLANT
ELECT NDL TIP 2.8 STRL (NEEDLE) ×2 IMPLANT
ELECT NEEDLE TIP 2.8 STRL (NEEDLE) ×1 IMPLANT
ELECT REM PT RETURN 15FT ADLT (MISCELLANEOUS) ×2 IMPLANT
FILTER STRAW (MISCELLANEOUS) ×2 IMPLANT
GAUZE PAD ABD 8X10 STRL (GAUZE/BANDAGES/DRESSINGS) IMPLANT
GLOVE BIOGEL PI IND STRL 7.0 (GLOVE) ×2 IMPLANT
GLOVE BIOGEL PI IND STRL 8 (GLOVE) ×2 IMPLANT
GLOVE SURG SS PI 8.0 STRL IVOR (GLOVE) ×4 IMPLANT
GOWN STRL REUS W/ TWL XL LVL3 (GOWN DISPOSABLE) ×4 IMPLANT
GRAFT TISS 40X70 1.26-1.75 THK (Tissue) IMPLANT
KIT BASIN OR (CUSTOM PROCEDURE TRAY) ×2 IMPLANT
KIT TURNOVER KIT A (KITS) IMPLANT
MANIFOLD NEPTUNE II (INSTRUMENTS) ×2 IMPLANT
NDL HD SCORPION MEGA LOADER (NEEDLE) IMPLANT
NDL SPNL 18GX3.5 QUINCKE PK (NEEDLE) ×2 IMPLANT
NEEDLE SPNL 18GX3.5 QUINCKE PK (NEEDLE) IMPLANT
PACK SHOULDER (CUSTOM PROCEDURE TRAY) ×2 IMPLANT
PROTECTOR NERVE ULNAR (MISCELLANEOUS) ×2 IMPLANT
RESTRAINT HEAD UNIVERSAL NS (MISCELLANEOUS) ×2 IMPLANT
SLING ARM IMMOBILIZER LRG (SOFTGOODS) IMPLANT
SLING ARM IMMOBILIZER MED (SOFTGOODS) IMPLANT
SLING ULTRA II L (ORTHOPEDIC SUPPLIES) IMPLANT
STRIP CLOSURE SKIN 1/2X4 (GAUZE/BANDAGES/DRESSINGS) IMPLANT
SUCTION TUBE FRAZIER 12FR DISP (SUCTIONS) ×2 IMPLANT
SUT ETHIBOND NAB CT1 #1 30IN (SUTURE) IMPLANT
SUT ETHILON 4 0 PS 2 18 (SUTURE) ×2 IMPLANT
SUT FIBERWIRE #2 38 T-5 BLUE (SUTURE) IMPLANT
SUT PROLENE 3 0 PS 2 (SUTURE) ×2 IMPLANT
SUT TIGER TAPE 7 IN WHITE (SUTURE) IMPLANT
SUT VIC AB 0 CT1 36 (SUTURE) ×4 IMPLANT
SUT VIC AB 1-0 CT2 27 (SUTURE) IMPLANT
SUT VIC AB 2-0 CT1 TAPERPNT 27 (SUTURE) IMPLANT
SUT VIC AB 2-0 CT2 27 (SUTURE) IMPLANT
SUT VICRYL 0 UR6 27IN ABS (SUTURE) ×2 IMPLANT
SUTURE FIBERWR #2 38 T-5 BLUE (SUTURE) IMPLANT
SYR 20ML LL LF (SYRINGE) ×2 IMPLANT
TAPE FIBER 2MM 7IN #2 BLUE (SUTURE) IMPLANT
TISSUE ARTHOFLEX 1.26-1.75MM (Tissue) ×1 IMPLANT
TOWEL OR 17X26 10 PK STRL BLUE (TOWEL DISPOSABLE) ×2 IMPLANT
TUBING ARTHROSCOPY IRRIG 16FT (MISCELLANEOUS) ×2 IMPLANT
TUBING CONNECTING 10 (TUBING) ×2 IMPLANT
WAND ABLATOR APOLLO I90 (BUR) ×2 IMPLANT
WIPE CHG 2% PREP (PERSONAL CARE ITEMS) ×2 IMPLANT

## 2023-08-28 NOTE — Anesthesia Postprocedure Evaluation (Signed)
 Anesthesia Post Note  Patient: Caroline Valdez  Procedure(s) Performed: REVISION MINI-OPEN ROTATOR CUFF REPAIR,RIGHT SHOULDER, WITH PATCH GRAFT (Right)     Patient location during evaluation: PACU Anesthesia Type: General Level of consciousness: awake and alert Pain management: pain level controlled Vital Signs Assessment: post-procedure vital signs reviewed and stable Respiratory status: spontaneous breathing, nonlabored ventilation and respiratory function stable Cardiovascular status: blood pressure returned to baseline Postop Assessment: no apparent nausea or vomiting Anesthetic complications: no   No notable events documented.  Last Vitals:  Vitals:   08/28/23 1430 08/28/23 1509  BP: (!) 155/81 (!) 158/84  Pulse: 78 84  Resp: 13 14  Temp:  36.7 C  SpO2: 94% 96%    Last Pain:  Vitals:   08/28/23 1430  TempSrc:   PainSc: 0-No pain                 Shanda Howells

## 2023-08-28 NOTE — Anesthesia Preprocedure Evaluation (Addendum)
 Anesthesia Evaluation  Patient identified by MRN, date of birth, ID band Patient awake    Reviewed: Allergy & Precautions, NPO status , Patient's Chart, lab work & pertinent test results  History of Anesthesia Complications Negative for: history of anesthetic complications  Airway Mallampati: II  TM Distance: >3 FB Neck ROM: Full    Dental no notable dental hx.    Pulmonary neg pulmonary ROS   Pulmonary exam normal        Cardiovascular hypertension, Normal cardiovascular exam     Neuro/Psych CVA    GI/Hepatic Neg liver ROS,GERD  Medicated,,  Endo/Other  negative endocrine ROS    Renal/GU negative Renal ROS     Musculoskeletal  (+) Arthritis ,  Fibromyalgia -Recurrent right rotator cuff tear   Abdominal   Peds  Hematology  (+) Blood dyscrasia (Von Willebrand disease type 1)   Anesthesia Other Findings Day of surgery medications reviewed with patient.  Reproductive/Obstetrics negative OB ROS                              Anesthesia Physical Anesthesia Plan  ASA: 2  Anesthesia Plan: General   Post-op Pain Management: Regional block* and Tylenol PO (pre-op)*   Induction: Intravenous  PONV Risk Score and Plan: 3 and Ondansetron, Dexamethasone, Treatment may vary due to age or medical condition and Midazolam  Airway Management Planned: Oral ETT  Additional Equipment: None  Intra-op Plan:   Post-operative Plan: Extubation in OR  Informed Consent: I have reviewed the patients History and Physical, chart, labs and discussed the procedure including the risks, benefits and alternatives for the proposed anesthesia with the patient or authorized representative who has indicated his/her understanding and acceptance.     Dental advisory given  Plan Discussed with: CRNA  Anesthesia Plan Comments:          Anesthesia Quick Evaluation

## 2023-08-28 NOTE — Anesthesia Procedure Notes (Signed)
 Anesthesia Regional Block: Interscalene brachial plexus block   Pre-Anesthetic Checklist: , timeout performed,  Correct Patient, Correct Site, Correct Laterality,  Correct Procedure, Correct Position, site marked,  Risks and benefits discussed,  Pre-op evaluation,  At surgeon's request and post-op pain management  Laterality: Right  Prep: Maximum Sterile Barrier Precautions used, chloraprep       Needles:  Injection technique: Single-shot  Needle Type: Echogenic Stimulator Needle     Needle Length: 4cm  Needle Gauge: 22     Additional Needles:   Procedures:,,,, ultrasound used (permanent image in chart),,    Narrative:  Start time: 08/28/2023 11:02 AM End time: 08/28/2023 11:05 AM Injection made incrementally with aspirations every 5 mL.  Performed by: Personally  Anesthesiologist: Kaylyn Layer, MD  Additional Notes: Risks, benefits, and alternative discussed. Patient gave consent for procedure. Patient prepped and draped in sterile fashion. Sedation administered, patient remains easily responsive to voice. Relevant anatomy identified with ultrasound guidance. Local anesthetic given in 5cc increments with no signs or symptoms of intravascular injection. No pain or paraesthesias with injection. Patient monitored throughout procedure with signs of LAST or immediate complications. Tolerated well. Ultrasound image placed in chart.  Caroline Greenhouse, MD

## 2023-08-28 NOTE — Progress Notes (Signed)
 No surgical orders placed , called into OR to inform  Dr Shelle Iron and his PA .

## 2023-08-28 NOTE — Interval H&P Note (Signed)
 History and Physical Interval Note:  08/28/2023 10:50 AM  Caroline Valdez  has presented today for surgery, with the diagnosis of Recurrent right rotator cuff tear.  The various methods of treatment have been discussed with the patient and family. After consideration of risks, benefits and other options for treatment, the patient has consented to  Procedure(s) with comments: ARTHROSCOPY, SHOULDER, WITH ROTATOR CUFF REPAIR (Right) - Revision right shoulder mini open rotator cuff repair with patch graft as a surgical intervention.  The patient's history has been reviewed, patient examined, no change in status, stable for surgery.  I have reviewed the patient's chart and labs.  Questions were answered to the patient's satisfaction.     Javier Docker

## 2023-08-28 NOTE — Anesthesia Procedure Notes (Addendum)
 Procedure Name: Intubation Date/Time: 08/28/2023 12:07 PM  Performed by: Williford, Peggy D, CRNAPre-anesthesia Checklist: Patient identified, Emergency Drugs available, Suction available and Patient being monitored Patient Re-evaluated:Patient Re-evaluated prior to induction Oxygen Delivery Method: Circle system utilized Preoxygenation: Pre-oxygenation with 100% oxygen Induction Type: IV induction Ventilation: Mask ventilation without difficulty Laryngoscope Size: Glidescope and 4 Tube type: Oral Tube size: 7.0 mm Number of attempts: 3 Airway Equipment and Method: Video-laryngoscopy and Rigid stylet Placement Confirmation: ETT inserted through vocal cords under direct vision, positive ETCO2 and breath sounds checked- equal and bilateral Secured at: 21 cm Tube secured with: Tape Dental Injury: Teeth and Oropharynx as per pre-operative assessment  Difficulty Due To: Difficult Airway- due to reduced neck mobility and Difficult Airway- due to anterior larynx Future Recommendations: Recommend- induction with short-acting agent, and alternative techniques readily available Comments: First view by CRNA/SRNA with no view of glottis with Mac 3 blade. Next attempt by myself with Hyacinth Meeker 3, very stiff neck with anterior larynx, no adequate view. Atraumatic intubation with Glidescope #3. Mask ventilated between attempts easily. Stephannie Peters, MD

## 2023-08-28 NOTE — Transfer of Care (Signed)
 Immediate Anesthesia Transfer of Care Note  Patient: Caroline Valdez  Procedure(s) Performed: REVISION MINI-OPEN ROTATOR CUFF REPAIR,RIGHT SHOULDER, WITH PATCH GRAFT (Right)  Patient Location: PACU  Anesthesia Type:General and Regional  Level of Consciousness: oriented and drowsy  Airway & Oxygen Therapy: Patient Spontanous Breathing and Patient connected to face mask oxygen  Post-op Assessment: Report given to RN, Post -op Vital signs reviewed and stable, and Patient moving all extremities X 4  Post vital signs: Reviewed and stable  Last Vitals:  Vitals Value Taken Time  BP 150/74 08/28/23 1355  Temp    Pulse 83 08/28/23 1357  Resp 15 08/28/23 1357  SpO2 99 % 08/28/23 1357  Vitals shown include unfiled device data.  Last Pain:  Vitals:   08/28/23 0952  TempSrc:   PainSc: 0-No pain         Complications: No notable events documented.

## 2023-08-28 NOTE — Brief Op Note (Signed)
 08/28/2023  11:15 AM  PATIENT:  Caroline Valdez  73 y.o. female  PRE-OPERATIVE DIAGNOSIS:  Recurrent right rotator cuff tear  POST-OPERATIVE DIAGNOSIS:  * No post-op diagnosis entered *  PROCEDURE:  Procedure(s) with comments: ARTHROSCOPY, SHOULDER, WITH ROTATOR CUFF REPAIR (Right) - Revision right shoulder mini open rotator cuff repair with patch graft  SURGEON:  Surgeons and Role:    Jene Every, MD - Primary  PHYSICIAN ASSISTANT:   ASSISTANTS: Bissell   ANESTHESIA:   general  EBL:  20   BLOOD ADMINISTERED:none  DRAINS: none   LOCAL MEDICATIONS USED:  MARCAINE     SPECIMEN:  No Specimen  DISPOSITION OF SPECIMEN:  N/A  COUNTS:  YES  TOURNIQUET:  * No tourniquets in log *  DICTATION: .Other Dictation: Dictation Number [id number]  PLAN OF CARE: Discharge to home after PACU  PATIENT DISPOSITION:  PACU - hemodynamically stable.   Delay start of Pharmacological VTE agent (>24hrs) due to surgical blood loss or risk of bleeding: yes

## 2023-08-28 NOTE — Discharge Instructions (Signed)
Aquacel dressing may remain in place until follow up. May shower with aquacel dressing in place. If the dressing becomes saturated or peels off, you may remove it and place a new dressing with gauze and tape which should be kept clean and dry and changed daily. °Use sling at times except when exercising or showering °No driving for 4-6 weeks °No lifting for 6 weeks operative arm °Pendulum exercises as instructed. °Ok to move wrist, elbow, and hand. °See Dr. Joene Gelder in 10-14 days. Take one aspirin per day with a meal if not on a blood thinner or allergic to aspirin. °

## 2023-08-29 ENCOUNTER — Encounter (HOSPITAL_COMMUNITY): Payer: Self-pay | Admitting: Specialist

## 2023-08-29 NOTE — Op Note (Signed)
 NAME: Caroline Valdez, Caroline W. MEDICAL RECORD NO: [id number] ACCOUNT NO: [id number] DATE OF BIRTH: December 31, 1950 FACILITY: [facility] LOCATION: [facility] PHYSICIAN: Javier Docker, MD  Operative Report   DATE OF PROCEDURE: 08/28/2023  PREOPERATIVE DIAGNOSIS:  Recurrent rotator cuff tear, right shoulder.  POSTOPERATIVE DIAGNOSIS:  Recurrent rotator cuff tear, right shoulder.  PROCEDURE PERFORMED:  Performed revision mini open rotator cuff repair, right shoulder, with Arthrex tissue patch graft augmentation.  ANESTHESIA:  General.  ASSISTANT:  Orland Penman, PA.  HISTORY:  A 73 year old with injury to the shoulder, high-grade tear of the recurrent tear of the supraspinatus indicated for repair.  Risks and benefits were discussed including bleeding, infection, damage to neurovascular structures, recurrent tear,  need for revision, etc.  Due to the recurrent tear, history of distal clavicle resection, acromioplasty, we discussed utilization and augmentation with a patch graft.  Also the patient's age, etc.  DESCRIPTION OF PROCEDURE:  The patient was supine in beach chair position.  After induction of adequate general anesthesia, 2 g of Kefzol, the right shoulder and upper extremity was prepped and draped in the usual sterile fashion.  A surgical marker was  utilized to delineate the acromion, AC joint, and coracoid.  A 3 cm incision over the anterolateral aspect of the acromion was performed with a 15 blade.  Subcutaneous tissue was dissected.  Electrocautery was utilized to achieve hemostasis.  The raphae  between the lateral anterior heads were identified, divided in line with skin incision.  Self-retaining retractor was placed.  Marcaine with epinephrine was infiltrated in the deltoid around the incision.  The previous acromioplasty had been performed.   We identified the tear within the rotator cuff, supraspinatus, essentially 90%.  I excised this tear measuring approximately 3 cm x 1 cm.  This was  debrided of good bleeding tissue.  Mobilized on the bursal and articular surfaces.  Copiously irrigated.   Placed two SwiveLock suture anchors medially at the articular interface after piling a hole with an awl inserting the SwiveLocks.  We had first utilized a rescue stitch side to side to bring the elliptical tear together.  We then passed with a Scorpion  suture passer.  The Tiger tape through the rotator cuff both anteriorly and posteriorly and through a tissue graft measuring approximately 3 x 2 cm.  We selected this due to the significant thinning noted in the leading edge of the tear.  We shuttled the  graft down to the rotator cuff.  We had passed two Tiger tapes posteriorly, two anteriorly, and medially.  Through the graft.  We crossed these and fixed them to a second row in the greater tuberosity with two additional SwiveLocks after piling a hole  with an awl, inserting the SwiveLocks with the arm in the neutral position without undue tension, incorporating the graft as well.  This fully covered the tear that was resected.  It provided good coverage.  We used rescue stitches to reapproximate dog  ear posteriorly.  I then used 0 Vicryl interrupted sutures to suture down the periphery of the graft.  This was 6 in total.  Full coverage was noted.  I performed a bursectomy as well as there was hypertrophic bursa noted.  Copiously irrigated with  irrigation and Prontosan.  Full coverage was noted.  No undue tension.  We had good resistance of pullout in her anchors.  Redundant suture was then removed.  I closed the raphe, 1-0 Vicryl interrupted figure-of-eight suture subcutaneous tissue with 2-0  and skin with subcuticular Monocryl.  Sterile dressing was applied, placed in an abduction pillow, extubated, and transported to the recovery room in satisfactory condition.  The patient tolerated the procedure well.  No complications.  Assistant, Orland Penman, PA was used throughout the case for patient  positioning, traction was applied to the arm, exposure and closure.  BLOOD LOSS:  20 mL.   PUS D: 08/28/2023 1:42:40 pm T: 08/29/2023 3:14:00 am  JOB: [id number]/ [id number]

## 2023-09-06 ENCOUNTER — Telehealth: Payer: Self-pay | Admitting: Family Medicine

## 2023-09-06 ENCOUNTER — Other Ambulatory Visit: Payer: Self-pay | Admitting: Family Medicine

## 2023-09-06 NOTE — Telephone Encounter (Signed)
 Prescription Request  09/06/2023  LOV: 06/24/2023  What is the name of the medication or equipment? Fluticasone  propionate nasal spray requesting 90 day supply  Have you contacted your pharmacy to request a refill? Yes   Which pharmacy would you like this sent to?  [facility] - [place], [place] - [address] [place] [address] Phone: [phone] Fax: [phone]    Patient notified that their request is being sent to the clinical staff for review and that they should receive a response within 2 business days.   Please advise at [facility] [phone]

## 2023-09-06 NOTE — Telephone Encounter (Signed)
 Requested medication (s) are due for refill today -yes  Requested medication (s) are on the active medication list -yes  Future visit scheduled -yes  Last refill: 03/15/23 #60 2RF  Notes to clinic: non delegated Rx  Requested Prescriptions  Pending Prescriptions Disp Refills   traMADol  (ULTRAM ) 50 MG tablet [Pharmacy Med Name: TRAMADOL  50MG  TABLETS] 60 tablet     Sig: TAKE 1 TABLET(50 MG) BY MOUTH EVERY 6 HOURS AS NEEDED     Not Delegated - Analgesics:  Opioid Agonists Failed - 09/06/2023  2:06 PM      Failed - This refill cannot be delegated      Failed - Urine Drug Screen completed in last 360 days      Failed - Valid encounter within last 3 months    Recent Outpatient Visits           1 month ago Viral upper respiratory tract infection   [place] [facility] Amadeo June, MD   2 months ago Suspected UTI   [place] [facility] Austine Lefort, MD   3 months ago Acute non-recurrent frontal sinusitis   [place] [facility] Jenelle Mis, FNP   4 months ago Acute pain of right shoulder   [place] [facility] Austine Lefort, MD   7 months ago Acute non-recurrent frontal sinusitis   [place] [facility] Jenelle Mis, FNP       Future Appointments             In 1 week Cheril Cork, Cisco Crest, MD [facility], [facility]               Requested Prescriptions  Pending Prescriptions Disp Refills   traMADol  (ULTRAM ) 50 MG tablet [Pharmacy Med Name: TRAMADOL  50MG  TABLETS] 60 tablet     Sig: TAKE 1 TABLET(50 MG) BY MOUTH EVERY 6 HOURS AS NEEDED     Not Delegated - Analgesics:  Opioid Agonists Failed - 09/06/2023  2:06 PM      Failed - This refill cannot be delegated      Failed - Urine Drug Screen completed in last 360 days      Failed - Valid encounter within last 3 months    Recent Outpatient Visits           1 month ago Viral upper  respiratory tract infection   [place] [facility] Amadeo June, MD   2 months ago Suspected UTI   [place] [facility] Austine Lefort, MD   3 months ago Acute non-recurrent frontal sinusitis   [place] [facility] Jenelle Mis, FNP   4 months ago Acute pain of right shoulder   [place] [facility] Austine Lefort, MD   7 months ago Acute non-recurrent frontal sinusitis   [place] [facility] Jenelle Mis, FNP       Future Appointments             In 1 week Pickard, Cisco Crest, MD [facility], [facility]

## 2023-09-09 ENCOUNTER — Other Ambulatory Visit: Payer: Self-pay

## 2023-09-09 DIAGNOSIS — R058 Other specified cough: Secondary | ICD-10-CM

## 2023-09-09 DIAGNOSIS — J069 Acute upper respiratory infection, unspecified: Secondary | ICD-10-CM

## 2023-09-09 DIAGNOSIS — R053 Chronic cough: Secondary | ICD-10-CM

## 2023-09-09 MED ORDER — AZELASTINE-FLUTICASONE 137-50 MCG/ACT NA SUSP: 1.0000 | Freq: Two times a day (BID) | NASAL | 11 refills | Status: DC

## 2023-09-11 ENCOUNTER — Telehealth: Payer: Self-pay

## 2023-09-11 NOTE — Telephone Encounter (Signed)
 Requesting New Prescription  Prescription Request  09/11/2023  LOV: 07/09/23  What is the name of the medication or equipment? fluticasone  (FLONASE ) 50 MCG/ACT nasal spray [[id number]]  DISCONTINUED   Have you contacted your pharmacy to request a refill? Yes   Which pharmacy would you like this sent to?  [facility] - [place], [place] - [address] [place] [address] Phone: [phone] Fax: [phone]    Patient notified that their request is being sent to the clinical staff for review and that they should receive a response within 2 business days.   Please advise at [facility] [phone]

## 2023-09-11 NOTE — Telephone Encounter (Signed)
 Called [facility]  (spoke with Dawn (pharmacist)and was advised pt discontinued med due to cost. 90 day supply is $76.00. Pt asking for Fluticasone  to be reordered.

## 2023-09-18 DIAGNOSIS — L570 Actinic keratosis: Secondary | ICD-10-CM | POA: Diagnosis not present

## 2023-09-18 DIAGNOSIS — L814 Other melanin hyperpigmentation: Secondary | ICD-10-CM | POA: Diagnosis not present

## 2023-09-18 DIAGNOSIS — L821 Other seborrheic keratosis: Secondary | ICD-10-CM | POA: Diagnosis not present

## 2023-09-18 DIAGNOSIS — Z85828 Personal history of other malignant neoplasm of skin: Secondary | ICD-10-CM | POA: Diagnosis not present

## 2023-09-19 ENCOUNTER — Ambulatory Visit (INDEPENDENT_AMBULATORY_CARE_PROVIDER_SITE_OTHER): Payer: Medicare Other | Admitting: Family Medicine

## 2023-09-19 ENCOUNTER — Encounter: Payer: Self-pay | Admitting: Family Medicine

## 2023-09-19 VITALS — BP 120/82 | HR 86 | Temp 97.5°F | Ht 65.0 in | Wt 132.6 lb

## 2023-09-19 DIAGNOSIS — M810 Age-related osteoporosis without current pathological fracture: Secondary | ICD-10-CM | POA: Diagnosis not present

## 2023-09-19 DIAGNOSIS — Z0001 Encounter for general adult medical examination with abnormal findings: Secondary | ICD-10-CM

## 2023-09-19 DIAGNOSIS — E7800 Pure hypercholesterolemia, unspecified: Secondary | ICD-10-CM | POA: Diagnosis not present

## 2023-09-19 DIAGNOSIS — Z8673 Personal history of transient ischemic attack (TIA), and cerebral infarction without residual deficits: Secondary | ICD-10-CM

## 2023-09-19 DIAGNOSIS — R5383 Other fatigue: Secondary | ICD-10-CM

## 2023-09-19 DIAGNOSIS — Z0000 Encounter for general adult medical examination without abnormal findings: Secondary | ICD-10-CM

## 2023-09-19 MED ORDER — FLUTICASONE PROPIONATE 50 MCG/ACT NA SUSP: 2.0000 | Freq: Every day | NASAL | 3 refills | Status: AC

## 2023-09-19 NOTE — Progress Notes (Signed)
 Subjective:    Patient ID: Caroline Valdez, female    DOB: 1950-08-01, 73 y.o.   MRN: [id number]  HPI Patient is a very sweet 73 year old Caucasian female here today for complete physical exam.  Patient has a history of breast cancer.  Her last mammogram was in April 2024.  This appears to be overdue.  Patient not done her mammogram yet because she just had surgery to repair a torn rotator cuff.  She is unable to lift her arm over her head to perform the mammogram.  She plans to schedule this after her shoulder heals.  Her last bone density was 4/24.  This showed continued improvement in her T-score.  Her T-score is now better than -2 in both hips.  S patient has been on bisphosphonate therapy for more than 5 years.  Therefore she has taken a therapeutic vacation.  Will repeat a bone density test in 2026 and resume therapy if T-score diminishes.  She has a history of a hysterectomy and therefore does not require Pap smear.  Her last colonoscopy was in 2020 and was clear except for diverticulosis so she does not require repeat colonoscopy.    Immunization History  Administered Date(s) Administered   Fluad Quad(high Dose 65+) 02/13/2019, 03/02/2021, 03/19/2022   Fluad Trivalent(High Dose 65+) 03/20/2023   Influenza Split 01/19/2014   Influenza Whole 03/07/2009, 03/06/2010   Influenza,inj,Quad PF,6+ Mos 01/26/2013, 02/17/2015, 01/28/2017, 02/06/2018   Influenza-Unspecified 03/09/2016, 04/04/2020   PFIZER(Purple Top)SARS-COV-2 Vaccination 06/25/2019, 07/16/2019   Pneumococcal Conjugate-13 10/22/2014   Pneumococcal Polysaccharide-23 09/07/2010, 03/23/2016   Td 12/25/2007   Tdap 12/25/2007, 04/17/2010, 12/11/2019   Zoster Recombinant(Shingrix) 07/23/2017, 10/22/2017   Zoster, Live 03/30/2010    Past Medical History:  Diagnosis Date   Arthritis    Blood dyscrasia    Von Willebrand factor    Breast cancer (HCC)    left   Complication of anesthesia    1980S  TUBAL LIG, + HEMM SURGERY PT  PASSED OUT AND WENT INTO SHOCK.  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    Endometriosis    Fibromyalgia    GERD (gastroesophageal reflux disease)    Hypercholesteremia    Hypertension    Mini stroke 09/2012   Stroke [facility])    MINI STROKE   Von Willebrand disease (HCC) 09/11/2013   Past Surgical History:  Procedure Laterality Date   ABDOMINAL HYSTERECTOMY     AUGMENTATION MAMMAPLASTY Right    BACK SURGERY     BREAST IMPLANT REMOVAL Right 12/05/2017   BREAST IMPLANT REMOVAL Right 12/05/2017   Procedure: REMOVAL RIGHT  BREAST IMPLANT MATERIAL;  Surgeon: Elidia Grout, MD;  Location: [facility] OR;  Service: Plastics;  Laterality: Right;   BREAST RECONSTRUCTION     BREAST RECONSTRUCTION Right 12/05/2017   Procedure: DELAYED BREAST RECONSTRUCTION WITH SILICONE IMPLANT;  Surgeon: Elidia Grout, MD;  Location: [facility] OR;  Service: Plastics;  Laterality: Right;   CARPAL TUNNEL RELEASE     right   COLONOSCOPY  01/2010   Dr. Alanda Allegra: normal   COLONOSCOPY WITH PROPOFOL  N/A 01/22/2019   Procedure: COLONOSCOPY WITH PROPOFOL ;  Surgeon: Suzette Espy, MD;  Location: [facility] ENDO SUITE;  Service: Endoscopy;  Laterality: N/A;  2:30pm - can not come earlier due to transportation   FINGER SURGERY     LEFT THUMB   HEMORRHOID SURGERY     X2   LAMINECTOMY     MASTECTOMY     left   SHOULDER  ARTHROSCOPY WITH ROTATOR CUFF REPAIR Right 08/28/2023   Procedure: REVISION MINI-OPEN ROTATOR CUFF REPAIR,RIGHT SHOULDER, WITH PATCH GRAFT;  Surgeon: Orvan Blanch, MD;  Location: [facility] ORS;  Service: Orthopedics;  Laterality: Right;  Revision right shoulder mini open rotator cuff repair with patch graft   SHOULDER SURGERY Right    SKIN CANCER EXCISION     nasal basal cell lesion,    Mohs surgery   TONSILLECTOMY     TUBAL LIGATION     Current Outpatient Medications on File Prior to Visit  Medication Sig Dispense Refill   acetaminophen  (TYLENOL ) 500 MG tablet Take 1,000 mg by mouth every 6  (six) hours as needed for headache or moderate pain (pain score 4-6).     ALPRAZolam  (XANAX ) 0.5 MG tablet TAKE 1 TABLET(0.5 MG) BY MOUTH AT BEDTIME AS NEEDED (Patient taking differently: Take 0.25 mg by mouth at bedtime.) 60 tablet 1   aspirin EC 81 MG tablet Take 81 mg by mouth in the morning. Swallow whole.     Azelastine -Fluticasone  (DYMISTA ) 137-50 MCG/ACT SUSP Place 1 puff into the nose 2 (two) times daily. 23 g 11   chlorpheniramine (ALLERGY RELIEF) 4 MG tablet Take 4 mg by mouth in the morning.     docusate sodium  (COLACE) 100 MG capsule Take 1 capsule (100 mg total) by mouth 2 (two) times daily. 60 capsule 2   famotidine  (PEPCID ) 20 MG tablet One at least an hour before bedtime 30 tablet 11   methocarbamol  (ROBAXIN ) 500 MG tablet Take 1 tablet (500 mg total) by mouth every 8 (eight) hours as needed for muscle spasms. 30 tablet 1   montelukast  (SINGULAIR ) 10 MG tablet TAKE 1 TABLET AT BEDTIME 90 tablet 2   Multiple Vitamin (MULTIVITAMIN WITH MINERALS) TABS tablet Take 1 tablet by mouth in the morning.     Multiple Vitamins-Minerals (EQ VISION FORMULA 50+ PO) Take 1 tablet by mouth daily.     ondansetron  (ZOFRAN ) 4 MG tablet TAKE 1 TABLET(4 MG) BY MOUTH EVERY 8 HOURS AS NEEDED FOR NAUSEA OR VOMITING 20 tablet 0   oxyCODONE  (OXY IR/ROXICODONE ) 5 MG immediate release tablet Take 1 tablet (5 mg total) by mouth every 4 (four) hours as needed for severe pain (pain score 7-10). 40 tablet 0   pantoprazole  (PROTONIX ) 40 MG tablet Take 1 tablet (40 mg total) by mouth 2 (two) times daily. 180 tablet 2   Plecanatide  (TRULANCE ) 3 MG TABS Take 1 tablet (3 mg total) by mouth daily. 90 tablet 3   polyethylene glycol (MIRALAX  / GLYCOLAX ) 17 g packet Take 17 g by mouth daily. 14 each 0   rosuvastatin  (CRESTOR ) 20 MG tablet Take 1 tablet (20 mg total) by mouth daily. 90 tablet 2   traMADol  (ULTRAM ) 50 MG tablet Take 1 tablet (50 mg total) by mouth every 6 (six) hours as needed. 60 tablet 0   No current  facility-administered medications on file prior to visit.   Allergies  Allergen Reactions   Aspirin Other (See Comments)    Pt has Von Willebrands Disease   Hydrocodone  Itching   Oxycodone  Itching   Tape Dermatitis    Pulls off skin   Social History   Socioeconomic History   Marital status: Widowed    Spouse name: Not on file   Number of children: 1   Years of education: Not on file   Highest education level: 12th grade  Occupational History   Not on file  Tobacco Use   Smoking status: Never  Smokeless tobacco: Never  Vaping Use   Vaping status: Never Used  Substance and Sexual Activity   Alcohol use: No   Drug use: No   Sexual activity: Not Currently    Comment: husband recently died from myelodysplastic syndrome in January of 2014  Other Topics Concern   Not on file  Social History Narrative   1 Daughter, April   1 grandson-20, 1 granddaughter-4.   Keeps granddaughter during the week.   Social Drivers of Corporate investment banker Strain: Low Risk  (05/03/2023)   Overall Financial Resource Strain (CARDIA)    Difficulty of Paying Living Expenses: Not hard at all  Food Insecurity: No Food Insecurity (06/18/2023)   Hunger Vital Sign    Worried About Running Out of Food in the Last Year: Never true    Ran Out of Food in the Last Year: Never true  Transportation Needs: No Transportation Needs (06/18/2023)   PRAPARE - Administrator, Civil Service (Medical): No    Lack of Transportation (Non-Medical): No  Physical Activity: Insufficiently Active (05/03/2023)   Exercise Vital Sign    Days of Exercise per Week: 2 days    Minutes of Exercise per Session: 30 min  Stress: No Stress Concern Present (05/03/2023)   [employer] of Occupational Health - Occupational Stress Questionnaire    Feeling of Stress : Only a little  Social Connections: Unknown (05/03/2023)   Social Connection and Isolation Panel [NHANES]    Frequency of Communication with Friends  and Family: Patient declined    Frequency of Social Gatherings with Friends and Family: Patient declined    Attends Religious Services: Patient declined    Database administrator or Organizations: Patient declined    Attends Banker Meetings: Not on file    Marital Status: Widowed  Intimate Partner Violence: Not At Risk (06/18/2023)   Humiliation, Afraid, Rape, and Kick questionnaire    Fear of Current or Ex-Partner: No    Emotionally Abused: No    Physically Abused: No    Sexually Abused: No   Family History  Problem Relation Age of Onset   Breast cancer Mother 81       now 66yo   Stroke Father        Father had no full sisters   Breast cancer Sister 92       now 19yo; had TAH/BSO   Lung cancer Brother        died 74yo; smoker   Immunization History  Administered Date(s) Administered   Fluad Quad(high Dose 65+) 02/13/2019, 03/02/2021, 03/19/2022   Fluad Trivalent(High Dose 65+) 03/20/2023   Influenza Split 01/19/2014   Influenza Whole 03/07/2009, 03/06/2010   Influenza,inj,Quad PF,6+ Mos 01/26/2013, 02/17/2015, 01/28/2017, 02/06/2018   Influenza-Unspecified 03/09/2016, 04/04/2020   PFIZER(Purple Top)SARS-COV-2 Vaccination 06/25/2019, 07/16/2019   Pneumococcal Conjugate-13 10/22/2014   Pneumococcal Polysaccharide-23 09/07/2010, 03/23/2016   Td 12/25/2007   Tdap 12/25/2007, 04/17/2010, 12/11/2019   Zoster Recombinant(Shingrix) 07/23/2017, 10/22/2017   Zoster, Live 03/30/2010      Review of Systems  All other systems reviewed and are negative.      Objective:   Physical Exam Vitals reviewed.  Constitutional:      General: She is not in acute distress.    Appearance: She is well-developed. She is not diaphoretic.  HENT:     Head: Normocephalic and atraumatic.     Right Ear: External ear normal.     Left Ear: External ear normal.  Nose: Nose normal.     Mouth/Throat:     Pharynx: No oropharyngeal exudate.  Eyes:     General: No scleral icterus.        Right eye: No discharge.        Left eye: No discharge.     Conjunctiva/sclera: Conjunctivae normal.     Pupils: Pupils are equal, round, and reactive to light.  Neck:     Thyroid: No thyromegaly.     Vascular: No JVD.     Trachea: No tracheal deviation.  Cardiovascular:     Rate and Rhythm: Normal rate and regular rhythm.     Heart sounds: Normal heart sounds. No murmur heard.    No friction rub. No gallop.  Pulmonary:     Effort: Pulmonary effort is normal. No respiratory distress.     Breath sounds: Normal breath sounds. No stridor. No wheezing or rales.  Chest:     Chest wall: No tenderness.  Abdominal:     General: Bowel sounds are normal. There is no distension.     Palpations: Abdomen is soft.     Tenderness: There is no abdominal tenderness. There is no guarding or rebound.  Musculoskeletal:        General: No tenderness. Normal range of motion.     Cervical back: Normal range of motion and neck supple.  Lymphadenopathy:     Cervical: No cervical adenopathy.  Skin:    General: Skin is warm.     Coloration: Skin is not pale.     Findings: No erythema or rash.  Neurological:     Mental Status: She is alert and oriented to person, place, and time.     Cranial Nerves: No cranial nerve deficit.     Motor: No abnormal muscle tone.     Coordination: Coordination normal.     Deep Tendon Reflexes: Reflexes normal.  Psychiatric:        Behavior: Behavior normal.        Thought Content: Thought content normal.        Judgment: Judgment normal.     Patient is currently in a sling due to recent medical surgery.  Incision over her right shoulder is intact with no evidence of cellulitis or infection.  The patient has decreased range of motion of the right shoulder due to the recent surgery      Assessment & Plan:  Pure hypercholesterolemia - Plan: Lipid panel, Hepatic function panel  History of TIA (transient ischemic attack)  Fatigue, unspecified type - Plan:  TSH  Osteoporosis, post-menopausal - Plan: VITAMIN D  25 Hydroxy (Vit-D Deficiency, Fractures)  General medical exam Patient's blood pressure today is outstanding.  She recently had a CBC and a BMP checked prior to surgery.  These were normal.  I see no reason to repeat these at the present time.  I will check a lipid panel.  Goal LDL cholesterol is less than 70 given her history of a TIA.  She does report some fatigue so I will check a TSH.  She also has a history of osteoporosis and therefore I will check a vitamin D  level.  Recommended the RSV vaccine.  Bone density test is due in 2026.  Will schedule her mammogram after her shoulder has healed from surgery

## 2023-09-20 LAB — HEPATIC FUNCTION PANEL
AG Ratio: 1.9 (calc) (ref 1.0–2.5)
ALT: 16 U/L (ref 6–29)
AST: 23 U/L (ref 10–35)
Albumin: 4.5 g/dL (ref 3.6–5.1)
Alkaline phosphatase (APISO): 61 U/L (ref 37–153)
Bilirubin, Direct: 0.1 mg/dL (ref 0.0–0.2)
Globulin: 2.4 g/dL (ref 1.9–3.7)
Indirect Bilirubin: 0.7 mg/dL (ref 0.2–1.2)
Total Bilirubin: 0.8 mg/dL (ref 0.2–1.2)
Total Protein: 6.9 g/dL (ref 6.1–8.1)

## 2023-09-20 LAB — LIPID PANEL
Cholesterol: 177 mg/dL (ref <200)
HDL: 73 mg/dL (ref >=50)
LDL Cholesterol (Calc): 82 mg/dL
Non-HDL Cholesterol (Calc): 104 mg/dL (ref <130)
Total CHOL/HDL Ratio: 2.4 (calc) (ref <5.0)
Triglycerides: 123 mg/dL (ref <150)

## 2023-09-20 LAB — VITAMIN D 25 HYDROXY (VIT D DEFICIENCY, FRACTURES): Vit D, 25-Hydroxy: 46 ng/mL (ref 30–100)

## 2023-09-20 LAB — TSH: TSH: 1.51 m[IU]/L (ref 0.40–4.50)

## 2023-09-24 DIAGNOSIS — M25611 Stiffness of right shoulder, not elsewhere classified: Secondary | ICD-10-CM | POA: Diagnosis not present

## 2023-09-24 DIAGNOSIS — M25511 Pain in right shoulder: Secondary | ICD-10-CM | POA: Diagnosis not present

## 2023-09-27 ENCOUNTER — Ambulatory Visit

## 2023-09-27 ENCOUNTER — Ambulatory Visit
Admission: EM | Admit: 2023-09-27 | Discharge: 2023-09-27 | Disposition: A | Discharge status: Home / Self Care | Attending: Nurse Practitioner | Admitting: Nurse Practitioner

## 2023-09-27 DIAGNOSIS — R399 Unspecified symptoms and signs involving the genitourinary system: Secondary | ICD-10-CM | POA: Insufficient documentation

## 2023-09-27 DIAGNOSIS — M25611 Stiffness of right shoulder, not elsewhere classified: Secondary | ICD-10-CM | POA: Diagnosis not present

## 2023-09-27 DIAGNOSIS — Z87440 Personal history of urinary (tract) infections: Secondary | ICD-10-CM | POA: Insufficient documentation

## 2023-09-27 DIAGNOSIS — M25511 Pain in right shoulder: Secondary | ICD-10-CM | POA: Diagnosis not present

## 2023-09-27 LAB — POCT URINALYSIS DIP (MANUAL ENTRY)
Bilirubin, UA: NEGATIVE
Glucose, UA: NEGATIVE mg/dL
Ketones, POC UA: NEGATIVE mg/dL
Nitrite, UA: NEGATIVE
Protein Ur, POC: 100 mg/dL — AB
Spec Grav, UA: >=1.03 — AB (ref 1.010–1.025)
Urobilinogen, UA: 0.2 U/dL
pH, UA: 6 (ref 5.0–8.0)

## 2023-09-27 MED ORDER — CEPHALEXIN 500 MG PO CAPS: 500.0000 mg | ORAL_CAPSULE | Freq: Four times a day (QID) | ORAL | 0 refills | Status: DC

## 2023-09-27 NOTE — Discharge Instructions (Addendum)
-  A urinalysis is pending. You will be contacted if the results of the culture show the antibiotic needs to be changed or discontinued.  -Take medications as prescribed. -Increase fluids. -You may take Tylenol  for pain, fever, or general discomfort. -Develop a toileting schedule that will allow you to urinate at least every 2 hours. -Avoid caffeine  to include tea, soda, and coffee. -Make sure you are drinking at least 8 to 10 eight ounce glasses of water daily.  -If sexually active, void at least 15 to 20 minutes after sexual intercourse. -Follow-up in the emergency department if you develop fever, chills, worsening abdominal pain, worsening urinary symptoms or other concerns.  -If the results of the culture are negative and you are continuing to experience symptoms, please follow-up with your PCP. -Follow-up as needed.

## 2023-09-27 NOTE — ED Provider Notes (Signed)
 [facility]    CSN: [id number] Arrival date & time: 09/27/23  1136      History   Chief Complaint No chief complaint on file.   HPI Caroline Valdez is a 73 y.o. female.   The history is provided by the patient.   Patient presents for complaints of urinary frequency and urgency that started this morning.  She also complains of suprapubic pressure.  She denies fever, chills, chest pain, abdominal pain, hematuria, dysuria, flank pain, low back pain, or decreased urine stream.  Patient states her last UTI was approximately 2 to 3 weeks ago.  Patient reports prior history of recurrent UTIs, but states they have been bothering her more frequently over the past several months.  Past Medical History:  Diagnosis Date   Arthritis    Blood dyscrasia    Von Willebrand factor    Breast cancer (HCC)    left   Complication of anesthesia    1980S  TUBAL LIG, + HEMM SURGERY PT PASSED OUT AND WENT INTO SHOCK.  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    Endometriosis    Fibromyalgia    GERD (gastroesophageal reflux disease)    Hypercholesteremia    Hypertension    Mini stroke 09/2012   Stroke [facility])    MINI STROKE   Von Willebrand disease (HCC) 09/11/2013    Patient Active Problem List   Diagnosis Date Noted   Right calf pain 05/23/2023   Upper airway cough syndrome 12/28/2022   GERD (gastroesophageal reflux disease) 10/15/2018   Personal history of breast cancer 12/05/2017   Osteoporosis 07/14/2014   Von Willebrand disease (HCC) 09/11/2013   Hypercholesteremia    Ductal carcinoma in situ (DCIS) of left breast    Dyslipidemia 09/03/2011   Type 1 von Willebrand disease (HCC) 09/03/2011   Malignant neoplasm of breast (HCC) 04/03/2011   Radiculopathy 11/08/2010   Fibromyalgia 11/08/2010    Past Surgical History:  Procedure Laterality Date   ABDOMINAL HYSTERECTOMY     AUGMENTATION MAMMAPLASTY Right    BACK SURGERY     BREAST IMPLANT  REMOVAL Right 12/05/2017   BREAST IMPLANT REMOVAL Right 12/05/2017   Procedure: REMOVAL RIGHT  BREAST IMPLANT MATERIAL;  Surgeon: Elidia Grout, MD;  Location: [facility] OR;  Service: Plastics;  Laterality: Right;   BREAST RECONSTRUCTION     BREAST RECONSTRUCTION Right 12/05/2017   Procedure: DELAYED BREAST RECONSTRUCTION WITH SILICONE IMPLANT;  Surgeon: Elidia Grout, MD;  Location: [facility] OR;  Service: Plastics;  Laterality: Right;   CARPAL TUNNEL RELEASE     right   COLONOSCOPY  01/2010   Dr. Alanda Allegra: normal   COLONOSCOPY WITH PROPOFOL  N/A 01/22/2019   Procedure: COLONOSCOPY WITH PROPOFOL ;  Surgeon: Suzette Espy, MD;  [facility];  Service: Endoscopy;  Laterality: N/A;  2:30pm - can not come earlier due to transportation   FINGER SURGERY     LEFT THUMB   HEMORRHOID SURGERY     X2   LAMINECTOMY     MASTECTOMY     left   SHOULDER ARTHROSCOPY WITH ROTATOR CUFF REPAIR Right 08/28/2023   Procedure: REVISION MINI-OPEN ROTATOR CUFF REPAIR,RIGHT SHOULDER, WITH PATCH GRAFT;  Surgeon: Orvan Blanch, MD;  Location: [facility];  Service: Orthopedics;  Laterality: Right;  Revision right shoulder mini open rotator cuff repair with patch graft   SHOULDER SURGERY Right    SKIN CANCER EXCISION     nasal basal cell lesion,  Mohs surgery   TONSILLECTOMY     TUBAL LIGATION      OB History   No obstetric history on file.      Home Medications    Prior to Admission medications   Medication Sig Start Date End Date Taking? Authorizing Provider  cephALEXin  (KEFLEX ) 500 MG capsule Take 1 capsule (500 mg total) by mouth 4 (four) times daily. 09/27/23  Yes Leath-Warren, Belen Bowers, NP  acetaminophen  (TYLENOL ) 500 MG tablet Take 1,000 mg by mouth every 6 (six) hours as needed for headache or moderate pain (pain score 4-6).    [provider]  ALPRAZolam  (XANAX ) 0.5 MG tablet TAKE 1 TABLET(0.5 MG) BY MOUTH AT BEDTIME AS NEEDED Patient taking differently: Take 0.25 mg by mouth at bedtime.  07/04/23   Austine Lefort, MD  aspirin EC 81 MG tablet Take 81 mg by mouth in the morning. Swallow whole.    [provider]  chlorpheniramine (ALLERGY RELIEF) 4 MG tablet Take 4 mg by mouth in the morning. 12/27/22   [provider]  docusate sodium  (COLACE) 100 MG capsule Take 1 capsule (100 mg total) by mouth 2 (two) times daily. 08/28/23 08/27/24  Orvan Blanch, MD  famotidine  (PEPCID ) 20 MG tablet One at least an hour before bedtime 12/28/22   Diamond Formica, MD  fluticasone  (FLONASE ) 50 MCG/ACT nasal spray Place 2 sprays into both nostrils daily. 09/19/23   Austine Lefort, MD  methocarbamol  (ROBAXIN ) 500 MG tablet Take 1 tablet (500 mg total) by mouth every 8 (eight) hours as needed for muscle spasms. 08/28/23   Orvan Blanch, MD  montelukast  (SINGULAIR ) 10 MG tablet TAKE 1 TABLET AT BEDTIME 04/11/23   Austine Lefort, MD  Multiple Vitamin (MULTIVITAMIN WITH MINERALS) TABS tablet Take 1 tablet by mouth in the morning.    [provider]  Multiple Vitamins-Minerals (EQ VISION FORMULA 50+ PO) Take 1 tablet by mouth daily.    [provider]  ondansetron  (ZOFRAN ) 4 MG tablet TAKE 1 TABLET(4 MG) BY MOUTH EVERY 8 HOURS AS NEEDED FOR NAUSEA OR VOMITING 05/24/22   Austine Lefort, MD  oxyCODONE  (OXY IR/ROXICODONE ) 5 MG immediate release tablet Take 1 tablet (5 mg total) by mouth every 4 (four) hours as needed for severe pain (pain score 7-10). 08/28/23   Orvan Blanch, MD  pantoprazole  (PROTONIX ) 40 MG tablet Take 1 tablet (40 mg total) by mouth 2 (two) times daily. 04/11/23   Austine Lefort, MD  Plecanatide  (TRULANCE ) 3 MG TABS Take 1 tablet (3 mg total) by mouth daily. 03/15/23   Eustacio Highman, NP  polyethylene glycol (MIRALAX  / GLYCOLAX ) 17 g packet Take 17 g by mouth daily. 08/28/23   Orvan Blanch, MD  rosuvastatin  (CRESTOR ) 20 MG tablet Take 1 tablet (20 mg total) by mouth daily. 04/11/23   Austine Lefort, MD  traMADol  (ULTRAM ) 50 MG tablet Take 1 tablet  (50 mg total) by mouth every 6 (six) hours as needed. 09/06/23   Austine Lefort, MD    Family History Family History  Problem Relation Age of Onset   Breast cancer Mother 48       now 3yo   Stroke Father        Father had no full sisters   Breast cancer Sister 80       now 72yo; had TAH/BSO   Lung cancer Brother        died 65yo; smoker    Social History Social History  Tobacco Use   Smoking status: Never   Smokeless tobacco: Never  Vaping Use   Vaping status: Never Used  Substance Use Topics   Alcohol use: No   Drug use: No     Allergies   Aspirin, Hydrocodone , Oxycodone , and Tape   Review of Systems Review of Systems Per HPI  Physical Exam Triage Vital Signs ED Triage Vitals  Encounter Vitals Group     BP 09/27/23 1143 137/70     Systolic BP Percentile --      Diastolic BP Percentile --      Pulse Rate 09/27/23 1143 (!) 105     Resp 09/27/23 1143 16     Temp 09/27/23 1143 98.2 F (36.8 C)     Temp Source 09/27/23 1143 Oral     SpO2 09/27/23 1143 95 %     Weight --      Height --      Head Circumference --      Peak Flow --      Pain Score 09/27/23 1142 0     Pain Loc --      Pain Education --      Exclude from Growth Chart --    No data found.  Updated Vital Signs BP 137/70 (BP Location: Right Arm)   Pulse (!) 105   Temp 98.2 F (36.8 C) (Oral)   Resp 16   SpO2 95%   Visual Acuity Right Eye Distance:   Left Eye Distance:   Bilateral Distance:    Right Eye Near:   Left Eye Near:    Bilateral Near:     Physical Exam Vitals and nursing note reviewed.  Constitutional:      General: She is not in acute distress.    Appearance: Normal appearance.  HENT:     Head: Normocephalic.  Eyes:     Extraocular Movements: Extraocular movements intact.     Pupils: Pupils are equal, round, and reactive to light.  Cardiovascular:     Rate and Rhythm: Normal rate and regular rhythm.     Pulses: Normal pulses.     Heart sounds: Normal heart  sounds.  Pulmonary:     Effort: Pulmonary effort is normal. No respiratory distress.     Breath sounds: Normal breath sounds. No stridor. No wheezing, rhonchi or rales.  Abdominal:     General: Bowel sounds are normal.     Palpations: Abdomen is soft.     Tenderness: There is abdominal tenderness in the suprapubic area. There is no right CVA tenderness or left CVA tenderness.  Musculoskeletal:     Cervical back: Normal range of motion.  Skin:    General: Skin is warm and dry.  Neurological:     General: No focal deficit present.     Mental Status: She is alert and oriented to person, place, and time.  Psychiatric:        Mood and Affect: Mood normal.        Behavior: Behavior normal.      UC Treatments / Results  Labs (all labs ordered are listed, but only abnormal results are displayed) Labs Reviewed  POCT URINALYSIS DIP (MANUAL ENTRY) - Abnormal; Notable for the following components:      Result Value   Color, UA straw (*)    Clarity, UA cloudy (*)    Spec Grav, UA >=1.030 (*)    Blood, UA large (*)    Protein Ur, POC =100 (*)    Leukocytes,  UA Large (3+) (*)    All other components within normal limits  URINE CULTURE    EKG   Radiology No results found.  Procedures Procedures (including critical care time)  Medications Ordered in UC Medications - No data to display  Initial Impression / Assessment and Plan / UC Course  I have reviewed the triage vital signs and the nursing notes.  Pertinent labs & imaging results that were available during my care of the patient were reviewed by me and considered in my medical decision making (see chart for details).  Urinalysis is positive for large leukocytes, blood, and protein, suspicious for urinary tract infection.  Urine culture is pending.  Per review of the patient's chart, patient has been treated for 2 UTIs this year.  In the interim, will treat with Keflex  500 mg 4 times daily for the next 7 days.  Supportive care  recommendations were provided and discussed with the patient to include fluids, over-the-counter analgesics, and developing a toileting schedule.  Patient was given strict ER follow-up precautions.  Patient was also advised if the culture results are negative and she is continue to experience symptoms, recommend following up with her PCP for further evaluation.  Patient was in agreement with this plan of care and verbalizes understanding.  All questions were answered.  Patient stable for discharge.  Final Clinical Impressions(s) / UC Diagnoses   Final diagnoses:  UTI symptoms   Discharge Instructions      -A urinalysis is pending. You will be contacted if the results of the culture show the antibiotic needs to be changed or discontinued.  -Take medications as prescribed. -Increase fluids. -You may take Tylenol  for pain, fever, or general discomfort. -Develop a toileting schedule that will allow you to urinate at least every 2 hours. -Avoid caffeine  to include tea, soda, and coffee. -Make sure you are drinking at least 8 to 10 eight ounce glasses of water daily.  -If sexually active, void at least 15 to 20 minutes after sexual intercourse. -Follow-up in the emergency department if you develop fever, chills, worsening abdominal pain, worsening urinary symptoms or other concerns.  -If the results of the culture are negative and you are continuing to experience symptoms, please follow-up with your PCP. -Follow-up as needed.   ED Prescriptions     Medication Sig Dispense Auth. Provider   cephALEXin  (KEFLEX ) 500 MG capsule Take 1 capsule (500 mg total) by mouth 4 (four) times daily. 28 capsule Leath-Warren, Belen Bowers, NP      PDMP not reviewed this encounter.   Hardy Lia, NP 09/27/23 1220

## 2023-09-27 NOTE — ED Triage Notes (Signed)
 Pt presents to UC for c/o urinary frequency and urgency starting this morning.

## 2023-09-30 LAB — URINE CULTURE: Culture: >=100000 — AB

## 2023-10-01 DIAGNOSIS — M25611 Stiffness of right shoulder, not elsewhere classified: Secondary | ICD-10-CM | POA: Diagnosis not present

## 2023-10-01 DIAGNOSIS — M25511 Pain in right shoulder: Secondary | ICD-10-CM | POA: Diagnosis not present

## 2023-10-03 DIAGNOSIS — M25611 Stiffness of right shoulder, not elsewhere classified: Secondary | ICD-10-CM | POA: Diagnosis not present

## 2023-10-03 DIAGNOSIS — M25511 Pain in right shoulder: Secondary | ICD-10-CM | POA: Diagnosis not present

## 2023-10-08 DIAGNOSIS — M25611 Stiffness of right shoulder, not elsewhere classified: Secondary | ICD-10-CM | POA: Diagnosis not present

## 2023-10-08 DIAGNOSIS — M25511 Pain in right shoulder: Secondary | ICD-10-CM | POA: Diagnosis not present

## 2023-10-09 ENCOUNTER — Encounter: Payer: Self-pay | Admitting: Family Medicine

## 2023-10-09 ENCOUNTER — Ambulatory Visit (INDEPENDENT_AMBULATORY_CARE_PROVIDER_SITE_OTHER): Admitting: Family Medicine

## 2023-10-09 ENCOUNTER — Telehealth: Payer: Self-pay

## 2023-10-09 ENCOUNTER — Ambulatory Visit: Admitting: Family Medicine

## 2023-10-09 VITALS — BP 138/88 | HR 96 | Temp 98.2°F | Ht 65.0 in | Wt 135.0 lb

## 2023-10-09 DIAGNOSIS — R300 Dysuria: Secondary | ICD-10-CM

## 2023-10-09 DIAGNOSIS — N390 Urinary tract infection, site not specified: Secondary | ICD-10-CM

## 2023-10-09 LAB — URINALYSIS, ROUTINE W REFLEX MICROSCOPIC
Bilirubin Urine: NEGATIVE
Casts: NONE SEEN /LPF
Crystals: NONE SEEN /HPF
Glucose, UA: NEGATIVE
Ketones, ur: NEGATIVE
Nitrite: NEGATIVE
Protein, ur: NEGATIVE
Specific Gravity, Urine: 1.01 (ref 1.001–1.035)
Yeast: NONE SEEN /HPF
pH: 7 (ref 5.0–8.0)

## 2023-10-09 LAB — MICROSCOPIC MESSAGE

## 2023-10-09 MED ORDER — NITROFURANTOIN MONOHYD MACRO 100 MG PO CAPS: 100.0000 mg | ORAL_CAPSULE | Freq: Two times a day (BID) | ORAL | 0 refills | Status: AC

## 2023-10-09 MED ORDER — PHENAZOPYRIDINE HCL 100 MG PO TABS: 100.0000 mg | ORAL_TABLET | Freq: Three times a day (TID) | ORAL | 0 refills | Status: AC | PRN

## 2023-10-09 NOTE — Telephone Encounter (Signed)
 Copied from CRM [id number]. Topic: Clinical - Medication Question >> Oct 09, 2023  7:43 AM Carlatta H wrote: Reason for CRM: Patient would like medication called in for pain and uti//she would like prescription called into [facility] on list//Please call patient to advise once done//She stated her utis usually progress within hours

## 2023-10-09 NOTE — Progress Notes (Signed)
 Patient Office Visit  Assessment & Plan:   Dysuria -     Urinalysis, Routine w reflex microscopic -     Urine Culture -     Nitrofurantoin Monohyd Macro; Take 1 capsule (100 mg total) by mouth 2 (two) times daily for 7 days.  Dispense: 14 capsule; Refill: 0 -     Phenazopyridine  HCl; Take 1 tablet (100 mg total) by mouth 3 (three) times daily as needed for up to 10 days for pain.  Dispense: 30 tablet; Refill: 0 -     Microscopic Message   Assessment and Plan    Urinary tract infection Acute UTI with dysuria, pressure, and frequency. Recurrent UTIs with E. coli sensitive to Cefalexin. - Check urine sample for current infection. - Ensure adequate hydration. - Consider cranberry tablets or other preventive measures.           No follow-ups on file.   Subjective:     Patient ID: Caroline Valdez, female    DOB: [birth year]  Age: 73 y.o. MRN: [id number]  Chief Complaint  Patient presents with   Dysuria    Started this morning. Burning and painful.    Dysuria    Discussed the use of AI scribe software for clinical note transcription with the patient, who gave verbal consent to proceed.  History of Present Illness   Caroline Valdez is a 73 year old female with recurrent urinary tract infections who presents with burning urination and pressure.  She has experienced a burning sensation and pressure during urination since this morning. No fever, chills, nausea, vomiting, or back pain. She reports frequent urination today with occasional hesitancy, but was fine last night. Her urine appeared yellow without blood today.  She has a history of recurrent urinary tract infections, with episodes in February and a recent course of Cefalexin completed last Friday. During previous infections, she has passed blood clots and had difficulty holding urine. She visited urgent care on May 9th, where a urine culture was performed, and she was treated with Cefalexin four times a day for seven  days. She did not receive a follow-up call regarding the culture results.  She drinks plenty of water and has a history of urethral dilation in the 1990s due to frequent infections. No history of kidney stones or smoking. She drinks one cup of half-decaffeinated coffee daily.    No tobacco use.  Physical Exam ABDOMEN: Abdominal pressure. Results LABS Urine culture: Escherichia coli (09/27/2023) Assessment & Plan Urinary tract infection Acute UTI with dysuria, pressure, and frequency. Recurrent UTIs with E. coli sensitive to Cefalexin. Will treat with Macrobid. Follow up on urine culture - Check urine sample for current infection. - Ensure adequate hydration. - Consider cranberry tablets or other preventive measures.     The ASCVD Risk score (Arnett DK, et al., 2019) failed to calculate for the following reasons:   Risk score cannot be calculated because patient has a medical history suggesting prior/existing ASCVD  Past Medical History:  Diagnosis Date   Allergy 2015?   Anxiety 2020?   Nighttime panic attacks   Arthritis    Blood dyscrasia    Von Willebrand factor    Blood transfusion without reported diagnosis 1967, 1974   Breast cancer (HCC)    left   Clotting disorder (HCC) Von Willenbrand   Complication of anesthesia    1980S  TUBAL LIG, + HEMM SURGERY PT PASSED OUT AND WENT INTO SHOCK.  NO PROBLEMS SINCE    Defective Cl/HCO3  exchange in ileum and colon    Endometriosis    Fibromyalgia    GERD (gastroesophageal reflux disease)    Hypercholesteremia    Hypertension    Mini stroke 09/2012   Stroke [facility])    MINI STROKE   Von Willebrand disease (HCC) 09/11/2013   Past Surgical History:  Procedure Laterality Date   ABDOMINAL HYSTERECTOMY     AUGMENTATION MAMMAPLASTY Right    BACK SURGERY     BREAST IMPLANT REMOVAL Right 12/05/2017   BREAST IMPLANT REMOVAL Right 12/05/2017   Procedure: REMOVAL RIGHT  BREAST IMPLANT MATERIAL;  Surgeon: Elidia Grout, MD;  Location:  [facility] OR;  Service: Plastics;  Laterality: Right;   BREAST RECONSTRUCTION     BREAST RECONSTRUCTION Right 12/05/2017   Procedure: DELAYED BREAST RECONSTRUCTION WITH SILICONE IMPLANT;  Surgeon: Elidia Grout, MD;  Location: [facility] OR;  Service: Plastics;  Laterality: Right;   CARPAL TUNNEL RELEASE     right   COLONOSCOPY  01/2010   Dr. Alanda Allegra: normal   COLONOSCOPY WITH PROPOFOL  N/A 01/22/2019   Procedure: COLONOSCOPY WITH PROPOFOL ;  Surgeon: Suzette Espy, MD;  Location: [facility];  Service: Endoscopy;  Laterality: N/A;  2:30pm - can not come earlier due to transportation   COSMETIC SURGERY  2011,   Breast   FINGER SURGERY     LEFT THUMB   HEMORRHOID SURGERY     X2   LAMINECTOMY     MASTECTOMY     left   SHOULDER ARTHROSCOPY WITH ROTATOR CUFF REPAIR Right 08/28/2023   Procedure: REVISION MINI-OPEN ROTATOR CUFF REPAIR,RIGHT SHOULDER, WITH PATCH GRAFT;  Surgeon: Orvan Blanch, MD;  Location: [facility];  Service: Orthopedics;  Laterality: Right;  Revision right shoulder mini open rotator cuff repair with patch graft   SHOULDER SURGERY Right    SKIN CANCER EXCISION     nasal basal cell lesion,    Mohs surgery   SPINE SURGERY  1989   TONSILLECTOMY     TUBAL LIGATION     Social History   Tobacco Use   Smoking status: Never   Smokeless tobacco: Never  Vaping Use   Vaping status: Never Used  Substance Use Topics   Alcohol use: No   Drug use: No   Family History  Problem Relation Age of Onset   Breast cancer Mother 69       now 26yo   Arthritis Mother    Cancer Mother    Hearing loss Mother    Vision loss Mother    Varicose Veins Mother    Stroke Father        Father had no full sisters   Early death Father    Hearing loss Father    Breast cancer Sister 50       now 27yo; had TAH/BSO   Cancer Sister    Hyperlipidemia Sister    Varicose Veins Sister    Lung cancer Brother        died 30yo; smoker   Asthma Daughter    Allergies  Allergen Reactions   Aspirin Other  (See Comments)    Pt has Von Willebrands Disease   Hydrocodone  Itching   Oxycodone  Itching   Tape Dermatitis    Pulls off skin    Review of Systems  Genitourinary:  Positive for dysuria.      Objective:    BP 138/88   Pulse 96   Temp 98.2 F (36.8 C)   Ht 5\' 5"  (1.651 m)   Wt  135 lb (61.2 kg)   SpO2 98%   BMI 22.47 kg/m  BP Readings from Last 3 Encounters:  10/09/23 138/88  09/27/23 137/70  09/19/23 120/82   Wt Readings from Last 3 Encounters:  10/09/23 135 lb (61.2 kg)  09/19/23 132 lb 9.6 oz (60.1 kg)  08/28/23 132 lb (59.9 kg)    Physical Exam Vitals and nursing note reviewed.  Constitutional:      General: She is not in acute distress.    Appearance: Normal appearance.  HENT:     Head: Normocephalic.     Right Ear: Tympanic membrane, ear canal and external ear normal.     Left Ear: Tympanic membrane, ear canal and external ear normal.  Eyes:     Extraocular Movements: Extraocular movements intact.     Conjunctiva/sclera: Conjunctivae normal.     Pupils: Pupils are equal, round, and reactive to light.  Cardiovascular:     Rate and Rhythm: Normal rate and regular rhythm.     Heart sounds: Normal heart sounds.  Pulmonary:     Effort: Pulmonary effort is normal.     Breath sounds: Normal breath sounds. No wheezing.  Abdominal:     General: Bowel sounds are normal.     Tenderness: There is abdominal tenderness. There is no right CVA tenderness or left CVA tenderness.     Comments: Suprapubic tenderness with palpation  Musculoskeletal:     Comments: Right arm in sling  Neurological:     General: No focal deficit present.     Mental Status: She is alert and oriented to person, place, and time.      Results for orders placed or performed in visit on 10/09/23  Urinalysis, Routine w reflex microscopic  Result Value Ref Range   Color, Urine YELLOW YELLOW   APPearance CLEAR CLEAR   Specific Gravity, Urine 1.010 1.001 - 1.035   pH 7.0 5.0 - 8.0    Glucose, UA NEGATIVE NEGATIVE   Bilirubin Urine NEGATIVE NEGATIVE   Ketones, ur NEGATIVE NEGATIVE   Hgb urine dipstick 2+ (A) NEGATIVE   Protein, ur NEGATIVE NEGATIVE   Nitrite NEGATIVE NEGATIVE   Leukocytes,Ua 2+ (A) NEGATIVE   WBC, UA 10-20 (A) 0 - 5 /HPF   RBC / HPF 3-10 (A) 0 - 2 /HPF   Squamous Epithelial / HPF 0-5 < OR = 5 /HPF   Bacteria, UA FEW (A) NONE SEEN /HPF   Crystals NONE SEEN NONE SEEN  /HPF   Casts NONE SEEN NONE SEEN /LPF   Yeast NONE SEEN NONE SEEN /HPF  Microscopic Message  Result Value Ref Range   Note

## 2023-10-10 DIAGNOSIS — M25611 Stiffness of right shoulder, not elsewhere classified: Secondary | ICD-10-CM | POA: Diagnosis not present

## 2023-10-10 DIAGNOSIS — M25511 Pain in right shoulder: Secondary | ICD-10-CM | POA: Diagnosis not present

## 2023-10-11 ENCOUNTER — Ambulatory Visit: Payer: Self-pay | Admitting: Family Medicine

## 2023-10-11 LAB — URINE CULTURE
MICRO NUMBER:: 16488915
SPECIMEN QUALITY:: ADEQUATE

## 2023-10-14 ENCOUNTER — Ambulatory Visit: Payer: Self-pay | Admitting: Family Medicine

## 2023-10-15 DIAGNOSIS — M25611 Stiffness of right shoulder, not elsewhere classified: Secondary | ICD-10-CM | POA: Diagnosis not present

## 2023-10-15 DIAGNOSIS — M25511 Pain in right shoulder: Secondary | ICD-10-CM | POA: Diagnosis not present

## 2023-10-17 DIAGNOSIS — M25611 Stiffness of right shoulder, not elsewhere classified: Secondary | ICD-10-CM | POA: Diagnosis not present

## 2023-10-17 DIAGNOSIS — M25511 Pain in right shoulder: Secondary | ICD-10-CM | POA: Diagnosis not present

## 2023-11-03 ENCOUNTER — Ambulatory Visit
Admission: EM | Admit: 2023-11-03 | Discharge: 2023-11-03 | Disposition: A | Discharge status: Home / Self Care | Attending: Family Medicine | Admitting: Family Medicine

## 2023-11-03 ENCOUNTER — Encounter: Payer: Self-pay | Admitting: Family Medicine

## 2023-11-03 DIAGNOSIS — N390 Urinary tract infection, site not specified: Secondary | ICD-10-CM | POA: Diagnosis not present

## 2023-11-03 LAB — POCT URINALYSIS DIP (MANUAL ENTRY)
Bilirubin, UA: NEGATIVE
Glucose, UA: NEGATIVE mg/dL
Ketones, POC UA: NEGATIVE mg/dL
Nitrite, UA: NEGATIVE
Protein Ur, POC: >=300 mg/dL — AB
Spec Grav, UA: 1.025 (ref 1.010–1.025)
Urobilinogen, UA: 0.2 U/dL
pH, UA: 6 (ref 5.0–8.0)

## 2023-11-03 MED ORDER — SULFAMETHOXAZOLE-TRIMETHOPRIM 800-160 MG PO TABS
1.0000 | ORAL_TABLET | Freq: Two times a day (BID) | ORAL | 0 refills | Status: AC
Start: 2023-11-03 — End: 2023-11-06

## 2023-11-03 NOTE — ED Provider Notes (Signed)
 [facility]    CSN: [id number] Arrival date & time: 11/03/23  1052      History   Chief Complaint No chief complaint on file.   HPI Caroline Valdez is a 73 y.o. female.   Patient presenting today with several day history of urinary frequency, urgency, dysuria.  Denies fever, chills, flank pain, abdominal pain, vomiting, diarrhea.  So far not trying anything over-the-counter for symptoms.  States that she has had 6 UTIs since February.    Past Medical History:  Diagnosis Date   Allergy 2015?   Anxiety 2020?   Nighttime panic attacks   Arthritis    Blood dyscrasia    Von Willebrand factor    Blood transfusion without reported diagnosis 1967, 1974   Breast cancer (HCC)    left   Clotting disorder (HCC) Von Willenbrand   Complication of anesthesia    1980S  TUBAL LIG, + HEMM SURGERY PT PASSED OUT AND WENT INTO SHOCK.  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    Endometriosis    Fibromyalgia    GERD (gastroesophageal reflux disease)    Hypercholesteremia    Hypertension    Mini stroke 09/2012   Stroke [facility])    MINI STROKE   Von Willebrand disease (HCC) 09/11/2013    Patient Active Problem List   Diagnosis Date Noted   Right calf pain 05/23/2023   Upper airway cough syndrome 12/28/2022   GERD (gastroesophageal reflux disease) 10/15/2018   Personal history of breast cancer 12/05/2017   Osteoporosis 07/14/2014   Von Willebrand disease (HCC) 09/11/2013   Hypercholesteremia    Ductal carcinoma in situ (DCIS) of left breast    Dyslipidemia 09/03/2011   Type 1 von Willebrand disease (HCC) 09/03/2011   Malignant neoplasm of breast (HCC) 04/03/2011   Radiculopathy 11/08/2010   Fibromyalgia 11/08/2010    Past Surgical History:  Procedure Laterality Date   ABDOMINAL HYSTERECTOMY     AUGMENTATION MAMMAPLASTY Right    BACK SURGERY     BREAST IMPLANT REMOVAL Right 12/05/2017   BREAST IMPLANT REMOVAL Right 12/05/2017    Procedure: REMOVAL RIGHT  BREAST IMPLANT MATERIAL;  Surgeon: Elidia Grout, MD;  Location: [facility] OR;  Service: Plastics;  Laterality: Right;   BREAST RECONSTRUCTION     BREAST RECONSTRUCTION Right 12/05/2017   Procedure: DELAYED BREAST RECONSTRUCTION WITH SILICONE IMPLANT;  Surgeon: Elidia Grout, MD;  Location: [facility] OR;  Service: Plastics;  Laterality: Right;   CARPAL TUNNEL RELEASE     right   COLONOSCOPY  01/2010   Dr. Alanda Allegra: normal   COLONOSCOPY WITH PROPOFOL  N/A 01/22/2019   Procedure: COLONOSCOPY WITH PROPOFOL ;  Surgeon: Suzette Espy, MD;  [facility];  Service: Endoscopy;  Laterality: N/A;  2:30pm - can not come earlier due to transportation   COSMETIC SURGERY  2011,   Breast   FINGER SURGERY     LEFT THUMB   HEMORRHOID SURGERY     X2   LAMINECTOMY     MASTECTOMY     left   SHOULDER ARTHROSCOPY WITH ROTATOR CUFF REPAIR Right 08/28/2023   Procedure: REVISION MINI-OPEN ROTATOR CUFF REPAIR,RIGHT SHOULDER, WITH PATCH GRAFT;  Surgeon: Orvan Blanch, MD;  Location: [facility];  Service: Orthopedics;  Laterality: Right;  Revision right shoulder mini open rotator cuff repair with patch graft   SHOULDER SURGERY Right    SKIN CANCER EXCISION     nasal basal cell lesion,    Mohs surgery  SPINE SURGERY  1989   TONSILLECTOMY     TUBAL LIGATION      OB History   No obstetric history on file.      Home Medications    Prior to Admission medications   Medication Sig Start Date End Date Taking? Authorizing Provider  sulfamethoxazole -trimethoprim  (BACTRIM  DS) 800-160 MG tablet Take 1 tablet by mouth 2 (two) times daily for 3 days. 11/03/23 11/06/23 Yes Corbin Dess, PA-C  acetaminophen  (TYLENOL ) 500 MG tablet Take 1,000 mg by mouth every 6 (six) hours as needed for headache or moderate pain (pain score 4-6).    [provider]  ALPRAZolam  (XANAX ) 0.5 MG tablet TAKE 1 TABLET(0.5 MG) BY MOUTH AT BEDTIME AS NEEDED Patient taking differently: Take 0.25 mg by  mouth at bedtime. 07/04/23   Austine Lefort, MD  aspirin EC 81 MG tablet Take 81 mg by mouth in the morning. Swallow whole.    [provider]  chlorpheniramine (ALLERGY RELIEF) 4 MG tablet Take 4 mg by mouth in the morning. 12/27/22   [provider]  famotidine  (PEPCID ) 20 MG tablet One at least an hour before bedtime 12/28/22   Diamond Formica, MD  fluticasone  (FLONASE ) 50 MCG/ACT nasal spray Place 2 sprays into both nostrils daily. 09/19/23   Austine Lefort, MD  montelukast  (SINGULAIR ) 10 MG tablet TAKE 1 TABLET AT BEDTIME 04/11/23   Austine Lefort, MD  Multiple Vitamin (MULTIVITAMIN WITH MINERALS) TABS tablet Take 1 tablet by mouth in the morning.    [provider]  Multiple Vitamins-Minerals (EQ VISION FORMULA 50+ PO) Take 1 tablet by mouth daily.    [provider]  ondansetron  (ZOFRAN ) 4 MG tablet TAKE 1 TABLET(4 MG) BY MOUTH EVERY 8 HOURS AS NEEDED FOR NAUSEA OR VOMITING 05/24/22   Austine Lefort, MD  oxyCODONE  (OXY IR/ROXICODONE ) 5 MG immediate release tablet Take 1 tablet (5 mg total) by mouth every 4 (four) hours as needed for severe pain (pain score 7-10). 08/28/23   Orvan Blanch, MD  pantoprazole  (PROTONIX ) 40 MG tablet Take 1 tablet (40 mg total) by mouth 2 (two) times daily. 04/11/23   Austine Lefort, MD  Plecanatide  (TRULANCE ) 3 MG TABS Take 1 tablet (3 mg total) by mouth daily. 03/15/23   Eustacio Highman, NP  polyethylene glycol (MIRALAX  / GLYCOLAX ) 17 g packet Take 17 g by mouth daily. 08/28/23   Orvan Blanch, MD  rosuvastatin  (CRESTOR ) 20 MG tablet Take 1 tablet (20 mg total) by mouth daily. 04/11/23   Austine Lefort, MD  traMADol  (ULTRAM ) 50 MG tablet Take 1 tablet (50 mg total) by mouth every 6 (six) hours as needed. 09/06/23   Austine Lefort, MD    Family History Family History  Problem Relation Age of Onset   Breast cancer Mother 72       now 52yo   Arthritis Mother    Cancer Mother    Hearing loss Mother    Vision  loss Mother    Varicose Veins Mother    Stroke Father        Father had no full sisters   Early death Father    Hearing loss Father    Breast cancer Sister 40       now 46yo; had TAH/BSO   Cancer Sister    Hyperlipidemia Sister    Varicose Veins Sister    Lung cancer Brother        died 34yo; smoker  Asthma Daughter     Social History Social History   Tobacco Use   Smoking status: Never   Smokeless tobacco: Never  Vaping Use   Vaping status: Never Used  Substance Use Topics   Alcohol use: No   Drug use: No     Allergies   Aspirin, Hydrocodone , Oxycodone , and Tape   Review of Systems Review of Systems Per HPI  Physical Exam Triage Vital Signs ED Triage Vitals  Encounter Vitals Group     BP 11/03/23 1108 (!) 147/83     Girls Systolic BP Percentile --      Girls Diastolic BP Percentile --      Boys Systolic BP Percentile --      Boys Diastolic BP Percentile --      Pulse Rate 11/03/23 1108 92     Resp 11/03/23 1108 20     Temp 11/03/23 1108 98.2 F (36.8 C)     Temp Source 11/03/23 1108 Oral     SpO2 11/03/23 1108 95 %     Weight --      Height --      Head Circumference --      Peak Flow --      Pain Score 11/03/23 1112 4     Pain Loc --      Pain Education --      Exclude from Growth Chart --    No data found.  Updated Vital Signs BP (!) 147/83 (BP Location: Right Arm)   Pulse 92   Temp 98.2 F (36.8 C) (Oral)   Resp 20   SpO2 95%   Visual Acuity Right Eye Distance:   Left Eye Distance:   Bilateral Distance:    Right Eye Near:   Left Eye Near:    Bilateral Near:     Physical Exam Vitals and nursing note reviewed.  Constitutional:      Appearance: Normal appearance. She is not ill-appearing.  HENT:     Head: Atraumatic.   Eyes:     Extraocular Movements: Extraocular movements intact.     Conjunctiva/sclera: Conjunctivae normal.    Cardiovascular:     Rate and Rhythm: Normal rate.  Pulmonary:     Effort: Pulmonary effort is  normal.  Abdominal:     General: Abdomen is flat. There is no distension.     Palpations: Abdomen is soft.     Tenderness: There is no abdominal tenderness. There is no right CVA tenderness, left CVA tenderness or guarding.   Musculoskeletal:        General: Normal range of motion.     Cervical back: Normal range of motion and neck supple.   Skin:    General: Skin is warm and dry.   Neurological:     Mental Status: She is alert and oriented to person, place, and time.   Psychiatric:        Mood and Affect: Mood normal.        Thought Content: Thought content normal.        Judgment: Judgment normal.      UC Treatments / Results  Labs (all labs ordered are listed, but only abnormal results are displayed) Labs Reviewed  POCT URINALYSIS DIP (MANUAL ENTRY) - Abnormal; Notable for the following components:      Result Value   Clarity, UA cloudy (*)    Blood, UA large (*)    Protein Ur, POC >=300 (*)    Leukocytes, UA Large (3+) (*)  All other components within normal limits  URINE CULTURE    EKG   Radiology No results found.  Procedures Procedures (including critical care time)  Medications Ordered in UC Medications - No data to display  Initial Impression / Assessment and Plan / UC Course  I have reviewed the triage vital signs and the nursing notes.  Pertinent labs & imaging results that were available during my care of the patient were reviewed by me and considered in my medical decision making (see chart for details).     Mildly hypertensive in triage, otherwise vital signs reassuring.  Urinalysis today with evidence of urinary tract infection.  Urine culture pending, will treat with Bactrim  and await culture results.  Push fluids.  Return for worsening symptoms.  Final Clinical Impressions(s) / UC Diagnoses   Final diagnoses:  Acute lower UTI   Discharge Instructions   None    ED Prescriptions     Medication Sig Dispense Auth. Provider    sulfamethoxazole -trimethoprim  (BACTRIM  DS) 800-160 MG tablet Take 1 tablet by mouth 2 (two) times daily for 3 days. 6 tablet Corbin Dess, [place]      PDMP not reviewed this encounter.   Corbin Dess, [place] 11/03/23 1611

## 2023-11-03 NOTE — ED Triage Notes (Signed)
 Pt reports UTI sx's urinary retention, urinary discomfort, urinary urgency and frequency, burning with urination and Teeth and jaw pain. That she states happened last time she had a UTI. So she Googled it to confirm that it was related.

## 2023-11-04 ENCOUNTER — Other Ambulatory Visit: Payer: Self-pay | Admitting: Family Medicine

## 2023-11-04 DIAGNOSIS — N390 Urinary tract infection, site not specified: Secondary | ICD-10-CM

## 2023-11-05 ENCOUNTER — Ambulatory Visit: Payer: Self-pay

## 2023-11-05 LAB — URINE CULTURE: Culture: 40000 — AB

## 2023-11-07 DIAGNOSIS — M25611 Stiffness of right shoulder, not elsewhere classified: Secondary | ICD-10-CM | POA: Diagnosis not present

## 2023-11-07 DIAGNOSIS — M25511 Pain in right shoulder: Secondary | ICD-10-CM | POA: Diagnosis not present

## 2023-11-11 ENCOUNTER — Other Ambulatory Visit (HOSPITAL_COMMUNITY): Payer: Self-pay | Admitting: Family Medicine

## 2023-11-11 DIAGNOSIS — Z1231 Encounter for screening mammogram for malignant neoplasm of breast: Secondary | ICD-10-CM

## 2023-11-14 ENCOUNTER — Ambulatory Visit (HOSPITAL_COMMUNITY)
Admission: RE | Admit: 2023-11-14 | Discharge: 2023-11-14 | Disposition: A | Discharge status: Home / Self Care | Source: Ambulatory Visit | Attending: Family Medicine | Admitting: Family Medicine

## 2023-11-14 ENCOUNTER — Encounter (HOSPITAL_COMMUNITY): Payer: Self-pay

## 2023-11-14 DIAGNOSIS — Z1231 Encounter for screening mammogram for malignant neoplasm of breast: Secondary | ICD-10-CM | POA: Insufficient documentation

## 2023-11-18 DIAGNOSIS — M25511 Pain in right shoulder: Secondary | ICD-10-CM | POA: Diagnosis not present

## 2023-11-18 DIAGNOSIS — M25611 Stiffness of right shoulder, not elsewhere classified: Secondary | ICD-10-CM | POA: Diagnosis not present

## 2023-11-21 DIAGNOSIS — M25611 Stiffness of right shoulder, not elsewhere classified: Secondary | ICD-10-CM | POA: Diagnosis not present

## 2023-11-21 DIAGNOSIS — M25511 Pain in right shoulder: Secondary | ICD-10-CM | POA: Diagnosis not present

## 2023-11-26 DIAGNOSIS — M25611 Stiffness of right shoulder, not elsewhere classified: Secondary | ICD-10-CM | POA: Diagnosis not present

## 2023-11-26 DIAGNOSIS — M25511 Pain in right shoulder: Secondary | ICD-10-CM | POA: Diagnosis not present

## 2023-11-27 NOTE — Progress Notes (Unsigned)
 GI Office Note    Referring Provider: Duanne Butler DASEN, MD Primary Care Physician:  Duanne Butler DASEN, MD Primary Gastroenterologist: [name].Rourk, MD  Date:  11/27/2023  ID:  Caroline Valdez, DOB 08/16/1950, MRN [id number]  Chief Complaint   No chief complaint on file.  History of Present Illness  Caroline Valdez is a 73 y.o. female with a history of constipation, von Willebrand disease, stroke, HLD, GERD, fibromyalgia, endometriosis, and breast cancer presenting today with complaint of ***  Colonoscopy September 2020 with diverticulosis in the sigmoid and descending colon, redundant colon, otherwise normal.  No repeat advised.   Virtual visit 04/16/2022.  Taking pantoprazole  and famotidine , still having reflux symptoms occasionally but has not had to use Tums in 1 month.  Has began having a cough at the beginning Linzess  therefore she stopped this.  Notes that she has too much fiber she gets constipated.  Proximal staying hydrated.  Denies any straining.  Hemorrhoid improves with having better bowel movements.  Denies any frequent rectal bleeding.  Using Preparation H suppository about 2 weeks prior to visit due to itching and burning.  Advised to start Amitiza  8 mcg twice daily.  Linzess .  Encouraged adequate hydration.  May use over-the-counter hemorrhoid suppositories as needed.  Continue PPI and cimetidine  twice daily.  Advised to follow-up in 3-4 months, sooner if needed and to notify if unable to obtain Amitiza .   Had stomach cramping and nausea with Amitiza .  Linzess  had worked in the past, however it eventually stopped working.   OV  07/19/22.  Constipation doing well on Trulance .  Having daily soft bowel movements without any abdominal pain.  Reflux doing okay.  Sometimes has bad days of symptoms and will need Tums which helps provide relief.  Have been continued to take her PPI and cimetidine  twice daily.  Hemorrhoids stable.  Using Preparation H cream and suppositories as  needed.  Denied any rectal bleeding.  Advised to continue current regimen.  No medication changes made.   OV  01/10/23.  Pulmonologist placed her on Pepcid  20 mg nightly and took her off cimetidine .  She was taking pantoprazole  twice daily.  Symptoms are not worse.  Keeps her head elevated about 6 inches.  Denied nausea or vomiting.  Denied any dysphagia but felt like throat was clogged up at times.  Had a decreased cough.  Taking Trulance  every day.  Last few weeks prior it was getting harder to go the need to strain.  She was taking some cough syrup with codeine  at the time.  Was not taking anything additional with her Trulance  at the time of her last office visit.  Had a hemorrhoid flare from straining, had been using Preparation H.  Advised Trulance  and adding MiraLAX  to her regimen.  Continue pantoprazole  40 mg twice daily and famotidine  20 mg nightly.  Advised her to use a suppository if she did not have a bowel movement in 2 days.  She is advised continue over-the-counter Preparation H for hemorrhoids.   Last office visit ***. Continue Trulance  3 mg daily and MiraLAX  17 g as needed.  Continue pantoprazole  40 mg twice daily and famotidine  40 mg nightly.  Advised could consider switching PPI between now and next visit if she preferred.  GERD diet reinforced.  Advise EGD in the future after shoulder recovery and can discuss at follow-up.   Today:    Wt Readings from Last 5 Encounters:  10/09/23 135 lb (61.2 kg)  09/19/23 132 lb 9.6  oz (60.1 kg)  08/28/23 132 lb (59.9 kg)  08/09/23 132 lb (59.9 kg)  07/29/23 131 lb 9.6 oz (59.7 kg)    Current Outpatient Medications  Medication Sig Dispense Refill   acetaminophen  (TYLENOL ) 500 MG tablet Take 1,000 mg by mouth every 6 (six) hours as needed for headache or moderate pain (pain score 4-6).     ALPRAZolam  (XANAX ) 0.5 MG tablet TAKE 1 TABLET(0.5 MG) BY MOUTH AT BEDTIME AS NEEDED (Patient taking differently: Take 0.25 mg by mouth at bedtime.) 60  tablet 1   aspirin EC 81 MG tablet Take 81 mg by mouth in the morning. Swallow whole.     chlorpheniramine (ALLERGY RELIEF) 4 MG tablet Take 4 mg by mouth in the morning.     famotidine  (PEPCID ) 20 MG tablet One at least an hour before bedtime 30 tablet 11   fluticasone  (FLONASE ) 50 MCG/ACT nasal spray Place 2 sprays into both nostrils daily. 48 g 3   montelukast  (SINGULAIR ) 10 MG tablet TAKE 1 TABLET AT BEDTIME 90 tablet 2   Multiple Vitamin (MULTIVITAMIN WITH MINERALS) TABS tablet Take 1 tablet by mouth in the morning.     Multiple Vitamins-Minerals (EQ VISION FORMULA 50+ PO) Take 1 tablet by mouth daily.     ondansetron  (ZOFRAN ) 4 MG tablet TAKE 1 TABLET(4 MG) BY MOUTH EVERY 8 HOURS AS NEEDED FOR NAUSEA OR VOMITING 20 tablet 0   oxyCODONE  (OXY IR/ROXICODONE ) 5 MG immediate release tablet Take 1 tablet (5 mg total) by mouth every 4 (four) hours as needed for severe pain (pain score 7-10). 40 tablet 0   pantoprazole  (PROTONIX ) 40 MG tablet Take 1 tablet (40 mg total) by mouth 2 (two) times daily. 180 tablet 2   Plecanatide  (TRULANCE ) 3 MG TABS Take 1 tablet (3 mg total) by mouth daily. 90 tablet 3   polyethylene glycol (MIRALAX  / GLYCOLAX ) 17 g packet Take 17 g by mouth daily. 14 each 0   rosuvastatin  (CRESTOR ) 20 MG tablet Take 1 tablet (20 mg total) by mouth daily. 90 tablet 2   traMADol  (ULTRAM ) 50 MG tablet Take 1 tablet (50 mg total) by mouth every 6 (six) hours as needed. 60 tablet 0   No current facility-administered medications for this visit.    Past Medical History:  Diagnosis Date   Allergy 2015?   Anxiety 2020?   Nighttime panic attacks   Arthritis    Blood dyscrasia    Von Willebrand factor    Blood transfusion without reported diagnosis 1967, 1974   Breast cancer (HCC)    left   Clotting disorder (HCC) Von Willenbrand   Complication of anesthesia    1980S  TUBAL LIG, + HEMM SURGERY PT PASSED OUT AND WENT INTO SHOCK.  NO PROBLEMS SINCE    Defective Cl/HCO3 exchange in  ileum and colon    Endometriosis    Fibromyalgia    GERD (gastroesophageal reflux disease)    Hypercholesteremia    Hypertension    Mini stroke 09/2012   Stroke [facility])    MINI STROKE   Von Willebrand disease (HCC) 09/11/2013    Past Surgical History:  Procedure Laterality Date   ABDOMINAL HYSTERECTOMY     AUGMENTATION MAMMAPLASTY Right    BACK SURGERY     BREAST IMPLANT REMOVAL Right 12/05/2017   BREAST IMPLANT REMOVAL Right 12/05/2017   Procedure: REMOVAL RIGHT  BREAST IMPLANT MATERIAL;  Surgeon: Leora Lenis, MD;  Location: [facility] OR;  Service: Plastics;  Laterality: Right;   BREAST RECONSTRUCTION  BREAST RECONSTRUCTION Right 12/05/2017   Procedure: DELAYED BREAST RECONSTRUCTION WITH SILICONE IMPLANT;  Surgeon: Leora Lenis, MD;  Location: [facility] OR;  Service: Plastics;  Laterality: Right;   CARPAL TUNNEL RELEASE     right   COLONOSCOPY  01/2010   Dr. Oneil Budge: normal   COLONOSCOPY WITH PROPOFOL  N/A 01/22/2019   Procedure: COLONOSCOPY WITH PROPOFOL ;  Surgeon: Shaaron [name], MD;  [facility];  Service: Endoscopy;  Laterality: N/A;  2:30pm - can not come earlier due to transportation   COSMETIC SURGERY  2011,   Breast   FINGER SURGERY     LEFT THUMB   HEMORRHOID SURGERY     X2   LAMINECTOMY     MASTECTOMY     left   SHOULDER ARTHROSCOPY WITH ROTATOR CUFF REPAIR Right 08/28/2023   Procedure: REVISION MINI-OPEN ROTATOR CUFF REPAIR,RIGHT SHOULDER, WITH PATCH GRAFT;  Surgeon: Duwayne Purchase, MD;  Location: [facility];  Service: Orthopedics;  Laterality: Right;  Revision right shoulder mini open rotator cuff repair with patch graft   SHOULDER SURGERY Right    SKIN CANCER EXCISION     nasal basal cell lesion,    Mohs surgery   SPINE SURGERY  1989   TONSILLECTOMY     TUBAL LIGATION      Family History  Problem Relation Age of Onset   Breast cancer Mother 39       now 44yo   Arthritis Mother    Cancer Mother    Hearing loss Mother    Vision loss Mother     Varicose Veins Mother    Stroke Father        Father had no full sisters   Early death Father    Hearing loss Father    Breast cancer Sister 44       now 5yo; had TAH/BSO   Cancer Sister    Hyperlipidemia Sister    Varicose Veins Sister    Lung cancer Brother        died 69yo; smoker   Asthma Daughter     Allergies as of 11/28/2023 - Review Complete 11/03/2023  Allergen Reaction Noted   Aspirin Other (See Comments) 02/15/2011   Hydrocodone  Itching 03/31/2018   Oxycodone  Itching 03/31/2018   Tape Dermatitis 06/21/2015    Social History   Socioeconomic History   Marital status: Widowed    Spouse name: Not on file   Number of children: 1   Years of education: Not on file   Highest education level: 12th grade  Occupational History   Not on file  Tobacco Use   Smoking status: Never   Smokeless tobacco: Never  Vaping Use   Vaping status: Never Used  Substance and Sexual Activity   Alcohol use: No   Drug use: No   Sexual activity: Not Currently    Comment: husband recently died from myelodysplastic syndrome in January of 2014  Other Topics Concern   Not on file  Social History Narrative   1 Daughter, April   1 grandson-20, 1 granddaughter-4.   Keeps granddaughter during the week.   Social Drivers of Corporate investment banker Strain: Low Risk  (05/03/2023)   Overall Financial Resource Strain (CARDIA)    Difficulty of Paying Living Expenses: Not hard at all  Food Insecurity: No Food Insecurity (06/18/2023)   Hunger Vital Sign    Worried About Running Out of Food in the Last Year: Never true    Ran Out of Food in the  Last Year: Never true  Transportation Needs: No Transportation Needs (06/18/2023)   PRAPARE - Administrator, Civil Service (Medical): No    Lack of Transportation (Non-Medical): No  Physical Activity: Insufficiently Active (05/03/2023)   Exercise Vital Sign    Days of Exercise per Week: 2 days    Minutes of Exercise per Session: 30 min   Stress: No Stress Concern Present (05/03/2023)   [employer] of Occupational Health - Occupational Stress Questionnaire    Feeling of Stress : Only a little  Social Connections: Unknown (05/03/2023)   Social Connection and Isolation Panel    Frequency of Communication with Friends and Family: Patient declined    Frequency of Social Gatherings with Friends and Family: Patient declined    Attends Religious Services: Patient declined    Database administrator or Organizations: Patient declined    Attends Banker Meetings: Not on file    Marital Status: Widowed     Review of Systems   Gen: Denies fever, chills, anorexia. Denies fatigue, weakness, weight loss.  CV: Denies chest pain, palpitations, syncope, peripheral edema, and claudication. Resp: Denies dyspnea at rest, cough, wheezing, coughing up blood, and pleurisy. GI: See HPI Derm: Denies rash, itching, dry skin Psych: Denies depression, anxiety, memory loss, confusion. No homicidal or suicidal ideation.  Heme: Denies bruising, bleeding, and enlarged lymph nodes.  Physical Exam   There were no vitals taken for this visit.  General:   Alert and oriented. No distress noted. Pleasant and cooperative.  Head:  Normocephalic and atraumatic. Eyes:  Conjuctiva clear without scleral icterus. Mouth:  Oral mucosa pink and moist. Good dentition. No lesions. Lungs:  Clear to auscultation bilaterally. No wheezes, rales, or rhonchi. No distress.  Heart:  S1, S2 present without murmurs appreciated.  Abdomen:  +BS, soft, non-tender and non-distended. No rebound or guarding. No HSM or masses noted. Rectal: *** Msk:  Symmetrical without gross deformities. Normal posture. Extremities:  Without edema. Neurologic:  Alert and  oriented x4 Psych:  Alert and cooperative. Normal mood and affect.  Assessment  Caroline Valdez is a 73 y.o. female presenting today with ***  GERD:  Constipation:  PLAN   ***   Follow up  ***    Charmaine Melia, MSN, FNP-BC, AGACNP-BC [facility]

## 2023-11-27 NOTE — H&P (View-Only) (Signed)
 GI Office Note    Referring Provider: Duanne Butler DASEN, MD Primary Care Physician:  Duanne Butler DASEN, MD Primary Gastroenterologist: [name].Rourk, MD  Date:  11/27/2023  ID:  Caroline Valdez, DOB 08/16/1950, MRN [id number]  Chief Complaint   No chief complaint on file.  History of Present Illness  Caroline Valdez is a 73 y.o. female with a history of constipation, von Willebrand disease, stroke, HLD, GERD, fibromyalgia, endometriosis, and breast cancer presenting today with complaint of ***  Colonoscopy September 2020 with diverticulosis in the sigmoid and descending colon, redundant colon, otherwise normal.  No repeat advised.   Virtual visit 04/16/2022.  Taking pantoprazole  and famotidine , still having reflux symptoms occasionally but has not had to use Tums in 1 month.  Has began having a cough at the beginning Linzess  therefore she stopped this.  Notes that she has too much fiber she gets constipated.  Proximal staying hydrated.  Denies any straining.  Hemorrhoid improves with having better bowel movements.  Denies any frequent rectal bleeding.  Using Preparation H suppository about 2 weeks prior to visit due to itching and burning.  Advised to start Amitiza  8 mcg twice daily.  Linzess .  Encouraged adequate hydration.  May use over-the-counter hemorrhoid suppositories as needed.  Continue PPI and cimetidine  twice daily.  Advised to follow-up in 3-4 months, sooner if needed and to notify if unable to obtain Amitiza .   Had stomach cramping and nausea with Amitiza .  Linzess  had worked in the past, however it eventually stopped working.   OV  07/19/22.  Constipation doing well on Trulance .  Having daily soft bowel movements without any abdominal pain.  Reflux doing okay.  Sometimes has bad days of symptoms and will need Tums which helps provide relief.  Have been continued to take her PPI and cimetidine  twice daily.  Hemorrhoids stable.  Using Preparation H cream and suppositories as  needed.  Denied any rectal bleeding.  Advised to continue current regimen.  No medication changes made.   OV  01/10/23.  Pulmonologist placed her on Pepcid  20 mg nightly and took her off cimetidine .  She was taking pantoprazole  twice daily.  Symptoms are not worse.  Keeps her head elevated about 6 inches.  Denied nausea or vomiting.  Denied any dysphagia but felt like throat was clogged up at times.  Had a decreased cough.  Taking Trulance  every day.  Last few weeks prior it was getting harder to go the need to strain.  She was taking some cough syrup with codeine  at the time.  Was not taking anything additional with her Trulance  at the time of her last office visit.  Had a hemorrhoid flare from straining, had been using Preparation H.  Advised Trulance  and adding MiraLAX  to her regimen.  Continue pantoprazole  40 mg twice daily and famotidine  20 mg nightly.  Advised her to use a suppository if she did not have a bowel movement in 2 days.  She is advised continue over-the-counter Preparation H for hemorrhoids.   Last office visit ***. Continue Trulance  3 mg daily and MiraLAX  17 g as needed.  Continue pantoprazole  40 mg twice daily and famotidine  40 mg nightly.  Advised could consider switching PPI between now and next visit if she preferred.  GERD diet reinforced.  Advise EGD in the future after shoulder recovery and can discuss at follow-up.   Today:    Wt Readings from Last 5 Encounters:  10/09/23 135 lb (61.2 kg)  09/19/23 132 lb 9.6  oz (60.1 kg)  08/28/23 132 lb (59.9 kg)  08/09/23 132 lb (59.9 kg)  07/29/23 131 lb 9.6 oz (59.7 kg)    Current Outpatient Medications  Medication Sig Dispense Refill   acetaminophen  (TYLENOL ) 500 MG tablet Take 1,000 mg by mouth every 6 (six) hours as needed for headache or moderate pain (pain score 4-6).     ALPRAZolam  (XANAX ) 0.5 MG tablet TAKE 1 TABLET(0.5 MG) BY MOUTH AT BEDTIME AS NEEDED (Patient taking differently: Take 0.25 mg by mouth at bedtime.) 60  tablet 1   aspirin EC 81 MG tablet Take 81 mg by mouth in the morning. Swallow whole.     chlorpheniramine (ALLERGY RELIEF) 4 MG tablet Take 4 mg by mouth in the morning.     famotidine  (PEPCID ) 20 MG tablet One at least an hour before bedtime 30 tablet 11   fluticasone  (FLONASE ) 50 MCG/ACT nasal spray Place 2 sprays into both nostrils daily. 48 g 3   montelukast  (SINGULAIR ) 10 MG tablet TAKE 1 TABLET AT BEDTIME 90 tablet 2   Multiple Vitamin (MULTIVITAMIN WITH MINERALS) TABS tablet Take 1 tablet by mouth in the morning.     Multiple Vitamins-Minerals (EQ VISION FORMULA 50+ PO) Take 1 tablet by mouth daily.     ondansetron  (ZOFRAN ) 4 MG tablet TAKE 1 TABLET(4 MG) BY MOUTH EVERY 8 HOURS AS NEEDED FOR NAUSEA OR VOMITING 20 tablet 0   oxyCODONE  (OXY IR/ROXICODONE ) 5 MG immediate release tablet Take 1 tablet (5 mg total) by mouth every 4 (four) hours as needed for severe pain (pain score 7-10). 40 tablet 0   pantoprazole  (PROTONIX ) 40 MG tablet Take 1 tablet (40 mg total) by mouth 2 (two) times daily. 180 tablet 2   Plecanatide  (TRULANCE ) 3 MG TABS Take 1 tablet (3 mg total) by mouth daily. 90 tablet 3   polyethylene glycol (MIRALAX  / GLYCOLAX ) 17 g packet Take 17 g by mouth daily. 14 each 0   rosuvastatin  (CRESTOR ) 20 MG tablet Take 1 tablet (20 mg total) by mouth daily. 90 tablet 2   traMADol  (ULTRAM ) 50 MG tablet Take 1 tablet (50 mg total) by mouth every 6 (six) hours as needed. 60 tablet 0   No current facility-administered medications for this visit.    Past Medical History:  Diagnosis Date   Allergy 2015?   Anxiety 2020?   Nighttime panic attacks   Arthritis    Blood dyscrasia    Von Willebrand factor    Blood transfusion without reported diagnosis 1967, 1974   Breast cancer (HCC)    left   Clotting disorder (HCC) Von Willenbrand   Complication of anesthesia    1980S  TUBAL LIG, + HEMM SURGERY PT PASSED OUT AND WENT INTO SHOCK.  NO PROBLEMS SINCE    Defective Cl/HCO3 exchange in  ileum and colon    Endometriosis    Fibromyalgia    GERD (gastroesophageal reflux disease)    Hypercholesteremia    Hypertension    Mini stroke 09/2012   Stroke [facility])    MINI STROKE   Von Willebrand disease (HCC) 09/11/2013    Past Surgical History:  Procedure Laterality Date   ABDOMINAL HYSTERECTOMY     AUGMENTATION MAMMAPLASTY Right    BACK SURGERY     BREAST IMPLANT REMOVAL Right 12/05/2017   BREAST IMPLANT REMOVAL Right 12/05/2017   Procedure: REMOVAL RIGHT  BREAST IMPLANT MATERIAL;  Surgeon: Leora Lenis, MD;  Location: [facility] OR;  Service: Plastics;  Laterality: Right;   BREAST RECONSTRUCTION  BREAST RECONSTRUCTION Right 12/05/2017   Procedure: DELAYED BREAST RECONSTRUCTION WITH SILICONE IMPLANT;  Surgeon: Leora Lenis, MD;  Location: [facility] OR;  Service: Plastics;  Laterality: Right;   CARPAL TUNNEL RELEASE     right   COLONOSCOPY  01/2010   Dr. Oneil Budge: normal   COLONOSCOPY WITH PROPOFOL  N/A 01/22/2019   Procedure: COLONOSCOPY WITH PROPOFOL ;  Surgeon: Shaaron [name], MD;  [facility];  Service: Endoscopy;  Laterality: N/A;  2:30pm - can not come earlier due to transportation   COSMETIC SURGERY  2011,   Breast   FINGER SURGERY     LEFT THUMB   HEMORRHOID SURGERY     X2   LAMINECTOMY     MASTECTOMY     left   SHOULDER ARTHROSCOPY WITH ROTATOR CUFF REPAIR Right 08/28/2023   Procedure: REVISION MINI-OPEN ROTATOR CUFF REPAIR,RIGHT SHOULDER, WITH PATCH GRAFT;  Surgeon: Duwayne Purchase, MD;  Location: [facility] ORS;  Service: Orthopedics;  Laterality: Right;  Revision right shoulder mini open rotator cuff repair with patch graft   SHOULDER SURGERY Right    SKIN CANCER EXCISION     nasal basal cell lesion,    Mohs surgery   SPINE SURGERY  1989   TONSILLECTOMY     TUBAL LIGATION      Family History  Problem Relation Age of Onset   Breast cancer Mother 39       now 44yo   Arthritis Mother    Cancer Mother    Hearing loss Mother    Vision loss Mother     Varicose Veins Mother    Stroke Father        Father had no full sisters   Early death Father    Hearing loss Father    Breast cancer Sister 44       now 5yo; had TAH/BSO   Cancer Sister    Hyperlipidemia Sister    Varicose Veins Sister    Lung cancer Brother        died 69yo; smoker   Asthma Daughter     Allergies as of 11/28/2023 - Review Complete 11/03/2023  Allergen Reaction Noted   Aspirin Other (See Comments) 02/15/2011   Hydrocodone  Itching 03/31/2018   Oxycodone  Itching 03/31/2018   Tape Dermatitis 06/21/2015    Social History   Socioeconomic History   Marital status: Widowed    Spouse name: Not on file   Number of children: 1   Years of education: Not on file   Highest education level: 12th grade  Occupational History   Not on file  Tobacco Use   Smoking status: Never   Smokeless tobacco: Never  Vaping Use   Vaping status: Never Used  Substance and Sexual Activity   Alcohol use: No   Drug use: No   Sexual activity: Not Currently    Comment: husband recently died from myelodysplastic syndrome in January of 2014  Other Topics Concern   Not on file  Social History Narrative   1 Daughter, April   1 grandson-20, 1 granddaughter-4.   Keeps granddaughter during the week.   Social Drivers of Corporate investment banker Strain: Low Risk  (05/03/2023)   Overall Financial Resource Strain (CARDIA)    Difficulty of Paying Living Expenses: Not hard at all  Food Insecurity: No Food Insecurity (06/18/2023)   Hunger Vital Sign    Worried About Running Out of Food in the Last Year: Never true    Ran Out of Food in the  Last Year: Never true  Transportation Needs: No Transportation Needs (06/18/2023)   PRAPARE - Administrator, Civil Service (Medical): No    Lack of Transportation (Non-Medical): No  Physical Activity: Insufficiently Active (05/03/2023)   Exercise Vital Sign    Days of Exercise per Week: 2 days    Minutes of Exercise per Session: 30 min   Stress: No Stress Concern Present (05/03/2023)   [employer] of Occupational Health - Occupational Stress Questionnaire    Feeling of Stress : Only a little  Social Connections: Unknown (05/03/2023)   Social Connection and Isolation Panel    Frequency of Communication with Friends and Family: Patient declined    Frequency of Social Gatherings with Friends and Family: Patient declined    Attends Religious Services: Patient declined    Database administrator or Organizations: Patient declined    Attends Banker Meetings: Not on file    Marital Status: Widowed     Review of Systems   Gen: Denies fever, chills, anorexia. Denies fatigue, weakness, weight loss.  CV: Denies chest pain, palpitations, syncope, peripheral edema, and claudication. Resp: Denies dyspnea at rest, cough, wheezing, coughing up blood, and pleurisy. GI: See HPI Derm: Denies rash, itching, dry skin Psych: Denies depression, anxiety, memory loss, confusion. No homicidal or suicidal ideation.  Heme: Denies bruising, bleeding, and enlarged lymph nodes.  Physical Exam   There were no vitals taken for this visit.  General:   Alert and oriented. No distress noted. Pleasant and cooperative.  Head:  Normocephalic and atraumatic. Eyes:  Conjuctiva clear without scleral icterus. Mouth:  Oral mucosa pink and moist. Good dentition. No lesions. Lungs:  Clear to auscultation bilaterally. No wheezes, rales, or rhonchi. No distress.  Heart:  S1, S2 present without murmurs appreciated.  Abdomen:  +BS, soft, non-tender and non-distended. No rebound or guarding. No HSM or masses noted. Rectal: *** Msk:  Symmetrical without gross deformities. Normal posture. Extremities:  Without edema. Neurologic:  Alert and  oriented x4 Psych:  Alert and cooperative. Normal mood and affect.  Assessment  Caroline Valdez is a 73 y.o. female presenting today with ***  GERD:  Constipation:  PLAN   ***   Follow up  ***    Charmaine Melia, MSN, FNP-BC, AGACNP-BC [facility]

## 2023-11-28 ENCOUNTER — Encounter: Payer: Self-pay | Admitting: Gastroenterology

## 2023-11-28 ENCOUNTER — Ambulatory Visit (INDEPENDENT_AMBULATORY_CARE_PROVIDER_SITE_OTHER): Admitting: Gastroenterology

## 2023-11-28 VITALS — BP 138/80 | HR 76 | Temp 98.6°F | Ht 65.0 in | Wt 136.6 lb

## 2023-11-28 DIAGNOSIS — M25511 Pain in right shoulder: Secondary | ICD-10-CM | POA: Diagnosis not present

## 2023-11-28 DIAGNOSIS — K5900 Constipation, unspecified: Secondary | ICD-10-CM | POA: Diagnosis not present

## 2023-11-28 DIAGNOSIS — K219 Gastro-esophageal reflux disease without esophagitis: Secondary | ICD-10-CM

## 2023-11-28 DIAGNOSIS — K5904 Chronic idiopathic constipation: Secondary | ICD-10-CM

## 2023-11-28 DIAGNOSIS — M25611 Stiffness of right shoulder, not elsewhere classified: Secondary | ICD-10-CM | POA: Diagnosis not present

## 2023-11-28 NOTE — Patient Instructions (Addendum)
 We are scheduling you for upper endoscopy in the near future with Dr. Shaaron.   I am giving you some samples of Voquezna today.  You will take 1 tablet once a day 30 minutes prior to meal.  While you are taking this and want you to stop taking your pantoprazole .  For the first several days please also refrain from taking your famotidine  at night.  You may take Tums as needed.  Please call me after you have completed the samples as if they are not effective and you like them I will send in prescription.  If after several days you are still having worsening symptoms or if they primarily tend to be at bedtime then please resume your famotidine  at night.  Follow a GERD diet:  Avoid fried, fatty, greasy, spicy, citrus foods. Avoid caffeine  and carbonated beverages. Avoid chocolate. Try eating 4-6 small meals a day rather than 3 large meals. Do not eat within 3 hours of laying down. Keep head of bed up on wood or bricks to create a 6 inch incline.  I will see you for follow-up after your upper endoscopy.  It was a pleasure to see you today. I want to create trusting relationships with patients. If you receive a survey regarding your visit,  I greatly appreciate you taking time to fill this out on paper or through your MyChart. I value your feedback.  Charmaine Melia, MSN, FNP-BC, AGACNP-BC [facility]

## 2023-12-02 ENCOUNTER — Telehealth (INDEPENDENT_AMBULATORY_CARE_PROVIDER_SITE_OTHER): Payer: Self-pay | Admitting: *Deleted

## 2023-12-02 DIAGNOSIS — Z4889 Encounter for other specified surgical aftercare: Secondary | ICD-10-CM | POA: Diagnosis not present

## 2023-12-02 DIAGNOSIS — M25511 Pain in right shoulder: Secondary | ICD-10-CM | POA: Diagnosis not present

## 2023-12-02 NOTE — Telephone Encounter (Signed)
 Called spoke with pt. Scheduled EGD with Dr. Shaaron, asa 2, on 8/7 at 12:30pm. Aware will send instructions to her.

## 2023-12-07 ENCOUNTER — Ambulatory Visit
Admission: EM | Admit: 2023-12-07 | Discharge: 2023-12-07 | Disposition: A | Discharge status: Home / Self Care | Attending: Nurse Practitioner | Admitting: Nurse Practitioner

## 2023-12-07 ENCOUNTER — Encounter: Payer: Self-pay | Admitting: Emergency Medicine

## 2023-12-07 ENCOUNTER — Ambulatory Visit

## 2023-12-07 DIAGNOSIS — R399 Unspecified symptoms and signs involving the genitourinary system: Secondary | ICD-10-CM | POA: Diagnosis not present

## 2023-12-07 DIAGNOSIS — Z87440 Personal history of urinary (tract) infections: Secondary | ICD-10-CM | POA: Diagnosis not present

## 2023-12-07 LAB — POCT URINALYSIS DIP (MANUAL ENTRY)
Bilirubin, UA: NEGATIVE
Glucose, UA: NEGATIVE mg/dL
Ketones, POC UA: NEGATIVE mg/dL
Nitrite, UA: NEGATIVE
Protein Ur, POC: NEGATIVE mg/dL
Spec Grav, UA: 1.01 (ref 1.010–1.025)
Urobilinogen, UA: 0.2 U/dL
pH, UA: 5.5 (ref 5.0–8.0)

## 2023-12-07 MED ORDER — SULFAMETHOXAZOLE-TRIMETHOPRIM 800-160 MG PO TABS: 1.0000 | ORAL_TABLET | Freq: Two times a day (BID) | ORAL | 0 refills | Status: AC

## 2023-12-07 NOTE — ED Provider Notes (Signed)
 [facility]    CSN: [id number] Arrival date & time: 12/07/23  1236      History   Chief Complaint No chief complaint on file.   HPI Caroline Valdez is a 73 y.o. female.   The history is provided by the patient.   Patient presents for complaints of lower abdominal pressure and urinary frequency that started this morning around 5 AM.  Patient denies fever, chills, dysuria, hematuria, flank pain, low back pain, decreased urine stream, vaginal symptoms, nausea, vomiting, or diarrhea.  Patient reports she has had 7 UTIs since February of this year.  States she is scheduled to see urology this week.  Past Medical History:  Diagnosis Date   Allergy 2015?   Anxiety 2020?   Nighttime panic attacks   Arthritis    Blood dyscrasia    Von Willebrand factor    Blood transfusion without reported diagnosis 1967, 1974   Breast cancer (HCC)    left   Clotting disorder (HCC) Von Willenbrand   Complication of anesthesia    1980S  TUBAL LIG, + HEMM SURGERY PT PASSED OUT AND WENT INTO SHOCK.  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    Endometriosis    Fibromyalgia    GERD (gastroesophageal reflux disease)    Hypercholesteremia    Hypertension    Mini stroke 09/2012   Stroke [facility])    MINI STROKE   Von Willebrand disease (HCC) 09/11/2013    Patient Active Problem List   Diagnosis Date Noted   Right calf pain 05/23/2023   Upper airway cough syndrome 12/28/2022   GERD (gastroesophageal reflux disease) 10/15/2018   Personal history of breast cancer 12/05/2017   Osteoporosis 07/14/2014   Von Willebrand disease (HCC) 09/11/2013   Hypercholesteremia    Ductal carcinoma in situ (DCIS) of left breast    Dyslipidemia 09/03/2011   Type 1 von Willebrand disease (HCC) 09/03/2011   Malignant neoplasm of breast (HCC) 04/03/2011   Radiculopathy 11/08/2010   Fibromyalgia 11/08/2010    Past Surgical History:  Procedure Laterality Date   ABDOMINAL  HYSTERECTOMY     AUGMENTATION MAMMAPLASTY Right    BACK SURGERY     BREAST IMPLANT REMOVAL Right 12/05/2017   BREAST IMPLANT REMOVAL Right 12/05/2017   Procedure: REMOVAL RIGHT  BREAST IMPLANT MATERIAL;  Surgeon: Leora Lenis, MD;  Location: [facility] OR;  Service: Plastics;  Laterality: Right;   BREAST RECONSTRUCTION     BREAST RECONSTRUCTION Right 12/05/2017   Procedure: DELAYED BREAST RECONSTRUCTION WITH SILICONE IMPLANT;  Surgeon: Leora Lenis, MD;  Location: [facility] OR;  Service: Plastics;  Laterality: Right;   CARPAL TUNNEL RELEASE     right   COLONOSCOPY  01/2010   Dr. Oneil Budge: normal   COLONOSCOPY WITH PROPOFOL  N/A 01/22/2019   Procedure: COLONOSCOPY WITH PROPOFOL ;  Surgeon: Shaaron [name], MD;  [facility];  Service: Endoscopy;  Laterality: N/A;  2:30pm - can not come earlier due to transportation   COSMETIC SURGERY  2011,   Breast   FINGER SURGERY     LEFT THUMB   HEMORRHOID SURGERY     X2   LAMINECTOMY     MASTECTOMY     left   SHOULDER ARTHROSCOPY WITH ROTATOR CUFF REPAIR Right 08/28/2023   Procedure: REVISION MINI-OPEN ROTATOR CUFF REPAIR,RIGHT SHOULDER, WITH PATCH GRAFT;  Surgeon: Duwayne Purchase, MD;  Location: [facility];  Service: Orthopedics;  Laterality: Right;  Revision right shoulder mini open rotator cuff repair  with patch graft   SHOULDER SURGERY Right    SKIN CANCER EXCISION     nasal basal cell lesion,    Mohs surgery   SPINE SURGERY  1989   TONSILLECTOMY     TUBAL LIGATION      OB History   No obstetric history on file.      Home Medications    Prior to Admission medications   Medication Sig Start Date End Date Taking? Authorizing Provider  sulfamethoxazole -trimethoprim  (BACTRIM  DS) 800-160 MG tablet Take 1 tablet by mouth 2 (two) times daily for 3 days. 12/07/23 12/10/23 Yes Leath-Warren, Etta PARAS, NP  acetaminophen  (TYLENOL ) 500 MG tablet Take 1,000 mg by mouth every 6 (six) hours as needed for headache or moderate pain (pain score 4-6).     [provider]  ALPRAZolam  (XANAX ) 0.5 MG tablet TAKE 1 TABLET(0.5 MG) BY MOUTH AT BEDTIME AS NEEDED Patient taking differently: TAKE 1 TABLET(0.5 MG) BY MOUTH AT BEDTIME AS NEEDED 07/04/23   Duanne Butler DASEN, MD  aspirin EC 81 MG tablet Take 81 mg by mouth in the morning. Swallow whole.    [provider]  famotidine  (PEPCID ) 20 MG tablet One at least an hour before bedtime 12/28/22   Wert, Michael B, MD  fluticasone  (FLONASE ) 50 MCG/ACT nasal spray Place 2 sprays into both nostrils daily. 09/19/23   Duanne Butler DASEN, MD  montelukast  (SINGULAIR ) 10 MG tablet TAKE 1 TABLET AT BEDTIME 04/11/23   Duanne Butler DASEN, MD  Multiple Vitamin (MULTIVITAMIN WITH MINERALS) TABS tablet Take 1 tablet by mouth in the morning.    [provider]  Multiple Vitamins-Minerals (EQ VISION FORMULA 50+ PO) Take 1 tablet by mouth daily.    [provider]  ondansetron  (ZOFRAN ) 4 MG tablet TAKE 1 TABLET(4 MG) BY MOUTH EVERY 8 HOURS AS NEEDED FOR NAUSEA OR VOMITING 05/24/22   Duanne Butler DASEN, MD  pantoprazole  (PROTONIX ) 40 MG tablet Take 1 tablet (40 mg total) by mouth 2 (two) times daily. 04/11/23   Duanne Butler DASEN, MD  Plecanatide  (TRULANCE ) 3 MG TABS Take 1 tablet (3 mg total) by mouth daily. 03/15/23   Kennedy Charmaine CROME, NP  rosuvastatin  (CRESTOR ) 20 MG tablet Take 1 tablet (20 mg total) by mouth daily. 04/11/23   Duanne Butler DASEN, MD  traMADol  (ULTRAM ) 50 MG tablet Take 1 tablet (50 mg total) by mouth every 6 (six) hours as needed. 11/04/23   Duanne Butler DASEN, MD    Family History Family History  Problem Relation Age of Onset   Breast cancer Mother 44       now 54yo   Arthritis Mother    Cancer Mother    Hearing loss Mother    Vision loss Mother    Varicose Veins Mother    Stroke Father        Father had no full sisters   Early death Father    Hearing loss Father    Breast cancer Sister 51       now 79yo; had TAH/BSO   Cancer Sister    Hyperlipidemia Sister     Varicose Veins Sister    Lung cancer Brother        died 65yo; smoker   Asthma Daughter     Social History Social History   Tobacco Use   Smoking status: Never   Smokeless tobacco: Never  Vaping Use   Vaping status: Never Used  Substance Use Topics   Alcohol use: No   Drug  use: No     Allergies   Aspirin, Hydrocodone , Oxycodone , and Tape   Review of Systems Review of Systems Per HPI  Physical Exam Triage Vital Signs ED Triage Vitals [12/07/23 1243]  Encounter Vitals Group     BP (!) 164/85     Girls Systolic BP Percentile      Girls Diastolic BP Percentile      Boys Systolic BP Percentile      Boys Diastolic BP Percentile      Pulse Rate 87     Resp 18     Temp 97.9 F (36.6 C)     Temp Source Oral     SpO2 97 %     Weight      Height      Head Circumference      Peak Flow      Pain Score 3     Pain Loc      Pain Education      Exclude from Growth Chart    No data found.  Updated Vital Signs BP (!) 164/85 (BP Location: Right Arm)   Pulse 87   Temp 97.9 F (36.6 C) (Oral)   Resp 18   SpO2 97%   Visual Acuity Right Eye Distance:   Left Eye Distance:   Bilateral Distance:    Right Eye Near:   Left Eye Near:    Bilateral Near:     Physical Exam Vitals and nursing note reviewed.  Constitutional:      General: She is not in acute distress.    Appearance: Normal appearance.  HENT:     Head: Normocephalic.  Eyes:     Extraocular Movements: Extraocular movements intact.     Conjunctiva/sclera: Conjunctivae normal.     Pupils: Pupils are equal, round, and reactive to light.  Cardiovascular:     Rate and Rhythm: Normal rate and regular rhythm.     Pulses: Normal pulses.     Heart sounds: Normal heart sounds.  Pulmonary:     Effort: Pulmonary effort is normal.     Breath sounds: Normal breath sounds.  Abdominal:     General: Bowel sounds are normal.     Palpations: Abdomen is soft.     Tenderness: There is no abdominal tenderness. There  is no right CVA tenderness or left CVA tenderness.  Musculoskeletal:     Cervical back: Normal range of motion.  Skin:    General: Skin is warm and dry.  Neurological:     General: No focal deficit present.     Mental Status: She is alert and oriented to person, place, and time.  Psychiatric:        Mood and Affect: Mood normal.        Behavior: Behavior normal.      UC Treatments / Results  Labs (all labs ordered are listed, but only abnormal results are displayed) Labs Reviewed  POCT URINALYSIS DIP (MANUAL ENTRY) - Abnormal; Notable for the following components:      Result Value   Blood, UA moderate (*)    Leukocytes, UA Trace (*)    All other components within normal limits  URINE CULTURE    EKG   Radiology No results found.  Procedures Procedures (including critical care time)  Medications Ordered in UC Medications - No data to display  Initial Impression / Assessment and Plan / UC Course  I have reviewed the triage vital signs and the nursing notes.  Pertinent labs & imaging  results that were available during my care of the patient were reviewed by me and considered in my medical decision making (see chart for details).  Mildly hypertensive in triage, otherwise vital signs reassuring. Urinalysis today with evidence of urinary tract infection. Urine culture pending, will treat with Bactrim  DS 800/160 mg tablets and await culture results.  Supportive care recommendations were provided and discussed with the patient to include over-the-counter analgesics, increasing fluids, developing a toileting schedule, and avoiding caffeine .  Patient advised to follow-up with urology as scheduled.  Patient was in agreement with this plan of care and verbalizes understanding.  All questions were answered.  Patient stable for discharge.  Final Clinical Impressions(s) / UC Diagnoses   Final diagnoses:  UTI symptoms     Discharge Instructions      A urine culture has been  ordered.  You will be contacted if the pending test results show that the antibiotic prescribed today needs to be changed or discontinued.  You also access to your results via MyChart. Take medication as prescribed. You may take over-the-counter Tylenol  as needed for pain, fever, or general discomfort. Continue to drink plenty of fluids.  Try to drink at least 8-10 8 ounce glasses of water daily. Avoid caffeine  such as tea, soda, or coffee while symptoms persist. Develop a toileting schedule that will allow you to urinate at least every 2 hours. As discussed, if your urine culture result is negative and you are continue to experience symptoms, follow-up with urology as scheduled. Follow-up as needed.     ED Prescriptions     Medication Sig Dispense Auth. Provider   sulfamethoxazole -trimethoprim  (BACTRIM  DS) 800-160 MG tablet Take 1 tablet by mouth 2 (two) times daily for 3 days. 6 tablet Leath-Warren, Etta PARAS, NP      PDMP not reviewed this encounter.   Gilmer Etta PARAS, NP 12/07/23 1258

## 2023-12-07 NOTE — Discharge Instructions (Addendum)
 A urine culture has been ordered.  You will be contacted if the pending test results show that the antibiotic prescribed today needs to be changed or discontinued.  You also access to your results via MyChart. Take medication as prescribed. You may take over-the-counter Tylenol  as needed for pain, fever, or general discomfort. Continue to drink plenty of fluids.  Try to drink at least 8-10 8 ounce glasses of water daily. Avoid caffeine  such as tea, soda, or coffee while symptoms persist. Develop a toileting schedule that will allow you to urinate at least every 2 hours. As discussed, if your urine culture result is negative and you are continue to experience symptoms, follow-up with urology as scheduled. Follow-up as needed.

## 2023-12-07 NOTE — ED Triage Notes (Signed)
 Lower ABD pain and urinary frequency that started this morning

## 2023-12-09 ENCOUNTER — Ambulatory Visit (HOSPITAL_COMMUNITY): Payer: Self-pay

## 2023-12-09 DIAGNOSIS — M25511 Pain in right shoulder: Secondary | ICD-10-CM | POA: Diagnosis not present

## 2023-12-09 DIAGNOSIS — M25611 Stiffness of right shoulder, not elsewhere classified: Secondary | ICD-10-CM | POA: Diagnosis not present

## 2023-12-09 LAB — URINE CULTURE: Culture: 20000 — AB

## 2023-12-10 ENCOUNTER — Other Ambulatory Visit: Payer: Self-pay | Admitting: Internal Medicine

## 2023-12-10 DIAGNOSIS — M25611 Stiffness of right shoulder, not elsewhere classified: Secondary | ICD-10-CM | POA: Diagnosis not present

## 2023-12-10 DIAGNOSIS — M25511 Pain in right shoulder: Secondary | ICD-10-CM | POA: Diagnosis not present

## 2023-12-12 DIAGNOSIS — N390 Urinary tract infection, site not specified: Secondary | ICD-10-CM | POA: Insufficient documentation

## 2023-12-12 NOTE — Progress Notes (Unsigned)
 Name: Caroline Valdez DOB: [birth year] MRN: [id number]  History of Present Illness: Caroline Valdez is a 73 y.o. female who presents today as a new patient at [facility] [place]. All available relevant medical records have been reviewed.   She reports chief complaint of recurrent UTls.  Urine culture results in past 12 months: - 06/23/2023: Positive for E. coli - 09/27/2023: Positive for E. coli - 10/09/2023: Positive for E. coli - 11/03/2023: Positive for E. coli - 12/07/2023: Positive for Klebsiella pneumoniae  Urinary Symptoms: She reports 7 UTl's in the last year. When present, UTI symptoms include dysuria, increased urinary urgency, frequency, occasional gross hematuria with or without clots.  She denies acute UTI symptoms today; just completed a round of antibiotics.  At baseline: She reports urinary frequency. Denies increased urinary urgency, incontinence, dysuria, gross hematuria, hesitancy, straining to void, or sensations of incomplete emptying. She denies pushing on a bulge in order to empty her bladder.  She denies history of pyelonephritis.  She denies history of kidney stones.  Reports history of a urethral dilation in 1998 after several UTIs.   Vaginal / prolapse Symptoms: She denies vaginal bulge sensation.  She denies seeing a vaginal bulge. She denies vaginal pain, bleeding, or discharge.   Bowel Symptoms: She denies constipation, diarrhea, rectal bleeding, pain with defecation, straining to defecate, or fecal incontinence.   Past OB/GYN History: OB History   No obstetric history on file.    She denies being sexually active; she is a widow.  She is post menopausal.  She reports history of abdominal hysterectomy.  Medications: Current Outpatient Medications  Medication Sig Dispense Refill   acetaminophen  (TYLENOL ) 500 MG tablet Take 1,000 mg by mouth every 6 (six) hours as needed for headache or moderate pain (pain score 4-6).      ALPRAZolam  (XANAX ) 0.5 MG tablet TAKE 1 TABLET(0.5 MG) BY MOUTH AT BEDTIME AS NEEDED (Patient taking differently: TAKE 1 TABLET(0.5 MG) BY MOUTH AT BEDTIME AS NEEDED) 60 tablet 1   aspirin EC 81 MG tablet Take 81 mg by mouth in the morning. Swallow whole.     estradiol (ESTRACE) 0.1 MG/GM vaginal cream Discard plastic applicator. Insert a blueberry size amount (approximately 1 gram) of cream on fingertip inside vagina at bedtime every night for 1 week then every other night. For long term use. 30 g 3   famotidine  (PEPCID ) 20 MG tablet TAKE 1 TABLET BY MOUTH AT LEAST 1 HOUR BEFORE BEDTIME 30 tablet 11   fluticasone  (FLONASE ) 50 MCG/ACT nasal spray Place 2 sprays into both nostrils daily. 48 g 3   montelukast  (SINGULAIR ) 10 MG tablet TAKE 1 TABLET AT BEDTIME 90 tablet 2   Multiple Vitamin (MULTIVITAMIN WITH MINERALS) TABS tablet Take 1 tablet by mouth in the morning.     Multiple Vitamins-Minerals (EQ VISION FORMULA 50+ PO) Take 1 tablet by mouth daily.     ondansetron  (ZOFRAN ) 4 MG tablet TAKE 1 TABLET(4 MG) BY MOUTH EVERY 8 HOURS AS NEEDED FOR NAUSEA OR VOMITING 20 tablet 0   pantoprazole  (PROTONIX ) 40 MG tablet Take 1 tablet (40 mg total) by mouth 2 (two) times daily. 180 tablet 2   Plecanatide  (TRULANCE ) 3 MG TABS Take 1 tablet (3 mg total) by mouth daily. 90 tablet 3   rosuvastatin  (CRESTOR ) 20 MG tablet Take 1 tablet (20 mg total) by mouth daily. 90 tablet 2   traMADol  (ULTRAM ) 50 MG tablet Take 1 tablet (50 mg total) by mouth every 6 (six) hours  as needed. 60 tablet 0   trimethoprim  (TRIMPEX ) 100 MG tablet Take 1 tablet (100 mg total) by mouth daily. 30 tablet 5   No current facility-administered medications for this visit.    Allergies: Allergies  Allergen Reactions   Aspirin Other (See Comments)    Pt has Von Willebrands Disease   Hydrocodone  Itching   Oxycodone  Itching   Tape Dermatitis    Pulls off skin    Past Medical History:  Diagnosis Date   Allergy 2015?   Anxiety 2020?    Nighttime panic attacks   Arthritis    Blood dyscrasia    Von Willebrand factor    Blood transfusion without reported diagnosis 1967, 1974   Breast cancer (HCC)    left   Clotting disorder (HCC) Von Willenbrand   Complication of anesthesia    1980S  TUBAL LIG, + HEMM SURGERY PT PASSED OUT AND WENT INTO SHOCK.  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    Endometriosis    Fibromyalgia    GERD (gastroesophageal reflux disease)    Hypercholesteremia    Hypertension    Mini stroke 09/2012   Stroke [facility])    MINI STROKE   Von Willebrand disease (HCC) 09/11/2013   Past Surgical History:  Procedure Laterality Date   ABDOMINAL HYSTERECTOMY     AUGMENTATION MAMMAPLASTY Right    BACK SURGERY     BREAST IMPLANT REMOVAL Right 12/05/2017   BREAST IMPLANT REMOVAL Right 12/05/2017   Procedure: REMOVAL RIGHT  BREAST IMPLANT MATERIAL;  Surgeon: Leora Lenis, MD;  Location: [facility] OR;  Service: Plastics;  Laterality: Right;   BREAST RECONSTRUCTION     BREAST RECONSTRUCTION Right 12/05/2017   Procedure: DELAYED BREAST RECONSTRUCTION WITH SILICONE IMPLANT;  Surgeon: Leora Lenis, MD;  Location: [facility] OR;  Service: Plastics;  Laterality: Right;   CARPAL TUNNEL RELEASE     right   COLONOSCOPY  01/2010   Dr. Oneil Budge: normal   COLONOSCOPY WITH PROPOFOL  N/A 01/22/2019   Procedure: COLONOSCOPY WITH PROPOFOL ;  Surgeon: Shaaron [name], MD;  Location: [facility] SUITE;  Service: Endoscopy;  Laterality: N/A;  2:30pm - can not come earlier due to transportation   COSMETIC SURGERY  2011,   Breast   FINGER SURGERY     LEFT THUMB   HEMORRHOID SURGERY     X2   LAMINECTOMY     MASTECTOMY     left   SHOULDER ARTHROSCOPY WITH ROTATOR CUFF REPAIR Right 08/28/2023   Procedure: REVISION MINI-OPEN ROTATOR CUFF REPAIR,RIGHT SHOULDER, WITH PATCH GRAFT;  Surgeon: Duwayne Purchase, MD;  Location: [facility] ORS;  Service: Orthopedics;  Laterality: Right;  Revision right shoulder mini open rotator  cuff repair with patch graft   SHOULDER SURGERY Right    SKIN CANCER EXCISION     nasal basal cell lesion,    Mohs surgery   SPINE SURGERY  1989   TONSILLECTOMY     TUBAL LIGATION     Family History  Problem Relation Age of Onset   Breast cancer Mother 64       now 50yo   Arthritis Mother    Cancer Mother    Hearing loss Mother    Vision loss Mother    Varicose Veins Mother    Stroke Father        Father had no full sisters   Early death Father    Hearing loss Father    Breast cancer Sister 39  now 52yo; had TAH/BSO   Cancer Sister    Hyperlipidemia Sister    Varicose Veins Sister    Lung cancer Brother        died 52yo; smoker   Asthma Daughter    Social History   Socioeconomic History   Marital status: Widowed    Spouse name: Not on file   Number of children: 1   Years of education: Not on file   Highest education level: 12th grade  Occupational History   Not on file  Tobacco Use   Smoking status: Never   Smokeless tobacco: Never  Vaping Use   Vaping status: Never Used  Substance and Sexual Activity   Alcohol use: No   Drug use: No   Sexual activity: Not Currently    Comment: husband recently died from myelodysplastic syndrome in January of 2014  Other Topics Concern   Not on file  Social History Narrative   1 Daughter, April   1 grandson-20, 1 granddaughter-4.   Keeps granddaughter during the week.   Social Drivers of Corporate investment banker Strain: Low Risk  (05/03/2023)   Overall Financial Resource Strain (CARDIA)    Difficulty of Paying Living Expenses: Not hard at all  Food Insecurity: No Food Insecurity (06/18/2023)   Hunger Vital Sign    Worried About Running Out of Food in the Last Year: Never true    Ran Out of Food in the Last Year: Never true  Transportation Needs: No Transportation Needs (06/18/2023)   PRAPARE - Administrator, Civil Service (Medical): No    Lack of Transportation (Non-Medical): No  Physical  Activity: Insufficiently Active (05/03/2023)   Exercise Vital Sign    Days of Exercise per Week: 2 days    Minutes of Exercise per Session: 30 min  Stress: No Stress Concern Present (05/03/2023)   [employer] of Occupational Health - Occupational Stress Questionnaire    Feeling of Stress : Only a little  Social Connections: Unknown (05/03/2023)   Social Connection and Isolation Panel    Frequency of Communication with Friends and Family: Patient declined    Frequency of Social Gatherings with Friends and Family: Patient declined    Attends Religious Services: Patient declined    Database administrator or Organizations: Patient declined    Attends Banker Meetings: Not on file    Marital Status: Widowed  Intimate Partner Violence: Not At Risk (06/18/2023)   Humiliation, Afraid, Rape, and Kick questionnaire    Fear of Current or Ex-Partner: No    Emotionally Abused: No    Physically Abused: No    Sexually Abused: No    SUBJECTIVE  Review of Systems Constitutional: Patient denies any unintentional weight loss or change in strength lntegumentary: Patient denies any rashes or pruritus Cardiovascular: Patient denies chest pain or syncope Respiratory: Patient denies shortness of breath Gastrointestinal: As per HPI Musculoskeletal: Patient denies muscle cramps or weakness Neurologic: Patient denies convulsions or seizures Allergic/Immunologic: Patient denies recent allergic reaction(s) Hematologic/Lymphatic: Patient denies bleeding tendencies Endocrine: Patient denies heat/cold intolerance  GU: As per HPI.  OBJECTIVE Vitals:   12/13/23 0853  BP: (!) 153/83  Pulse: 84   There is no height or weight on file to calculate BMI.  Physical Examination Constitutional: No obvious distress; patient is non-toxic appearing  Cardiovascular: No visible lower extremity edema.  Respiratory: The patient does not have audible wheezing/stridor; respirations do not appear  labored  Gastrointestinal: Abdomen non-distended Musculoskeletal: Normal ROM  of UEs  Skin: No obvious rashes/open sores  Neurologic: CN 2-12 grossly intact Psychiatric: Answered questions appropriately with normal affect  Hematologic/Lymphatic/Immunologic: No obvious bruises or sites of spontaneous bleeding  Urine microscopy: unremarkable PVR: 22 ml  ASSESSMENT Recurrent UTI - Plan: BLADDER SCAN AMB NON-IMAGING, Urinalysis, Routine w reflex microscopic, trimethoprim  (TRIMPEX ) 100 MG tablet, estradiol (ESTRACE) 0.1 MG/GM vaginal cream  Recurrent UTls:  We discussed the possible etiologies of recurrent UTls including ascending infection related to intercourse; vaginal atrophy; transmural infection that has been treated incompletely; urinary tract stones; incomplete bladder emptying with urinary stasis; kidney or bladder tumor; urethral diverticulum; and colonization of  vagina and urinary tract with pathologic, adherent organisms.   For UTI prevention advised: > Maintain adequate fluid intake daily to flush out the urinary tract. > Go to the bathroom to urinate every 4-6 hours while awake to minimize urinary stasis / bacterial overgrowth in the bladder. > Starting topical vaginal estrogen cream. The rationale, appropriate use, and potential pros / cons were discussed in detail.  > UTI prophylaxis with a daily low dose antibiotic.   Handout provided with additional recommendations / options for UTI prevention.   Patient ultimately decided to start with topical vaginal estrogen cream, Trimethoprim  100 mg daily, and will consider OTC supplements for UTI prevention. Handout provided.  We agreed to plan for follow up in 3 months or sooner if needed.  Patient verbalized understanding of and agreement with current plan. All questions were answered.  PLAN Advised the following: 1. Trimethoprim  100 mg daily for UTI prophylaxis. 2. Start topical vaginal estrogen cream as prescribed. 3. Maintain  adequate fluid intake daily to flush out the urinary tract. 4. Consider OTC supplements for UTI prevention. 5. Return in about 3 months (around 03/14/2024) for Recurrent UTI, with UA.  Orders Placed This Encounter  Procedures   Urinalysis, Routine w reflex microscopic   BLADDER SCAN AMB NON-IMAGING    It has been explained that the patient is to follow regularly with their PCP in addition to all other providers involved in their care and to follow instructions provided by these respective offices. Patient advised to contact urology clinic if any urologic-pertaining questions, concerns, new symptoms or problems arise in the interim period.  Patient Instructions  UTI prevention / management:  UTI symptoms may include:  - Pain / burning / discomfort when urinating - Recent increase in urinary urgency (how quickly you feel like you need to rush to the bathroom) - Recent increase in urinary frequency (how often you are urinating) - Fever - Acute mental status change / confusion - Fatigue / Feeling tired - Weakness - Note: Urine color, clarity, and odor are not considered to be clinically significant indicators of UTI and do not warrant urine testing unless patient is also experiencing UTI symptoms such as those listed above.  Difference between Urinalysis (urine dipstick test) and Urine culture / Why urine culture often needed to determine appropriate diagnosis and treatment of urologic symptoms: > Urinalysis (urine dipstick test): A quick office test used as an indicator to determine whether or not further testing is necessary (such as a urine culture, urine microscopy, etc.) The urinalysis cannot differentiate a true bacterial UTI or give a definitive diagnosis for the findings.  > Urine culture: May be performed based on the findings of a urinalysis to evaluate for UTI. Grows out on a petri dish for 48-72 hours. Provides important information about: whether or not bacterial growth is  present and if so: what  the predominant bacteria is which antibiotics will work best against that bacteria That information is important so that we can diagnose and treat patients appropriately as there are other conditions which may mimic UTls which must not be missed (such as cancer, interstitial cystitis, stones, etc.). Assists us  with antibiotic stewardship to minimize patient's risk for developing antibiotic resistance (getting to a point where no antibiotics work anymore).  Options when UTI symptoms occur: 1. Call [facility] [place] and request to speak with triage nurse (phone # [phone], select option 3). In accordance with clinic guidelines the nurse will determine next steps based on patient-reported symptoms, which may include: same-day lab visit to provide urine specimen, recommendation to schedule Urology office visit appointment for further evaluation, recommendation to proceed to ER, etc. 2. Call your Primary Care Provider (PCP) office to request urgent / same-day visit. Be sure to request for urine culture to be ordered and have results faxed to Urology (fax # [phone]).  3. Go to urgent care. Be sure to request for urine culture to be ordered and have results faxed to Urology (fax # [phone]).   For bladder pain/ burning with urination: - Can take over-the-counter Pyridium  (phenazopyridine ; commonly known under the AZO brand) for a few days as needed. Limit use to no more than 3 days consecutively due to risk for methemoglobinemia, liver function issues, and bone health damage with long term use of Pyridium . - Alternative: Prescription urinary analgesics (such as Uribel, Urogesic blue, Urelle, Uro-MP). Often expensive / poorly covered by insurance unfortunately.  Options / recommendations for UTI prevention: - Low dose antibiotic daily for UTI prophylaxis. - Topical vaginal estrogen for vaginal atrophy (aka Genitourinary Syndrome of Menopause (GSM)). -  Adequate daily fluid intake to flush out the urinary tract. - Go to the bathroom to urinate every 4-6 hours while awake to minimize urinary stasis / bacterial overgrowth in the bladder. - Proanthocyanidin (PAC) supplement 36 mg daily; must be soluble (insoluble form of PAC will be ineffective). Recommended brand: Ellura. This is an over-the-counter supplement (often must be found/ purchased online) supplement derived from cranberries with concentrated active component: Proanthocyanidin (PAC) 36 mg daily. Decreases bacterial adherence to bladder lining.  - D-mannose powder (2 grams daily). This is an over-the-counter supplement which decreases bacterial adherence to bladder lining (it is a sugar that inhibits bacterial adherence to urothelial cells by binding to the pili of enteric bacteria). Take as per manufacturer recommendation. Can be used as an alternative or in addition to the concentrated cranberry supplement.  - Vitamin C supplement to acidify urine to minimize bacterial growth.  - Probiotic to maintain healthy vaginal microbiome to suppress bacteria at urethral opening. Brand recommendations: Verlon (includes probiotic & D-mannose ), Feminine Balance (highest concentration of lactobacillus) or Hyperbiotic Pro 15.  Note for patients with diabetes:  - Be aware that D-mannose contains sugar.  Note for patients with interstitial cystitis (IC):  - Patients with IC should typically avoid cranberry/ PAC supplements and Vitamin C supplements due to their acidity, which may exacerbate IC-related bladder pain. - Symptoms of true bacterial UTI can overlap / mimic symptoms of an IC flare up. Antibiotic use is NOT indicated for IC flare ups. Urine culture needed prior to antibiotic treatment for IC patients. The goal is to minimize your risk for developing antibiotic-resistant bacteria.    Vaginal atrophy I Genitourinary syndrome of menopause (GSM):  What it is: Changes in the vaginal environment  (including the vulva and urethra) including: Thinning of  the epithelium (skin/ mucosa surface) Can contribute to urinary urgency and frequency Can contribute to dryness, itching, irritation of the vulvar and vaginal tissue Can contribute to pain with intercourse Can contribute to physical changes of the labia, vulva, and vagina such as: Narrowing of the vaginal opening Decreased vaginal length Loss of labial architecture Labial adhesions Pale color of vulvovaginal tissue Loss of pubic hair Allows bacteria to become adherent Results in increased risk for urinary tract infection (UTI) due to bacterial overgrowth and migration up the urethra into the bladder Change in vaginal pH (acid/ base balance) Allows for alteration / disruption of the normal bacterial flora / microbiome Results in increased risk for urinary tract infection (UTI) due to bacterial overgrowth  Treatment options: Over-the-counter lubricants (see list below). Prescription vaginal estrogen replacement. Options: Topical vaginal estrogen (estradiol) cream: (Estrace, Premarin, compounded) Apply as directed Estring vaginal ring Exchanged every 3 months (either at home or in office by provider) Vagifem vaginal tablet Inserted nightly for 2 weeks then twice a week (long term) lntrarosa vaginal suppository Vaginal DHEA: converts to estrogen in vaginal tissue without systemic effect Inserted nightly (long term) 3. Vaginal laser therapy (Mona Olam touch) Performed in 3 treatments each 6 weeks apart (available in our [place] office). Can feel like a sunburn for 3-4 days after each treatment until new skin heals in. Usually not covered by insurance. Estimated cost is $1500 for all 3 sessions.  FYI regarding prescription vaginal estrogen treatment options: - All topical vaginal estrogen replacement options are equivalent in terms of efficacy. - Topical vaginal estrogen replacement will take about 3 months to be effective. - OK  to have sex with any of the topical vaginal estrogen replacement options. - Topical vaginal estrogen replacement may sting/burn initially due to severe dryness, which will improve with ongoing treatment. - Studies have demonstrated negligible systemic absorption of low-dose intravaginal estrogen cream therefore the risk for cancer development or recurrence with this medication is minimal.  Genitourinary Syndrome of Menopause: AUA/SUFU/AUGS Guideline (2025) [place].at  Topical vaginal estrogen cream safe to use with breast cancer history [email or web address].com.[email or web address]  Topical vaginal estrogen cream safe to use with blood clot history [email or web address]   Lubricants and Moisturizers for Treating Genitourinary Syndrome of Menopause and Vulvovaginal Atrophy Treatment Comments I Available Products   Lubricants   Water-based Ingredients: Deionized water, glycerin, propylene glycol; latex safe; rare irritation; dry out with extended sexual activity Astroglide, Good Clean Love, K-Y Jelly, Natural, Organic, Pink, Sliquid, Sylk, Yes    Oil Based Ingredients: avocado, olive, peanut, corn; latex safe; can be used with silicone products; staining; safe (unless peanut allergy); non-irritating Coconut oil, vegetable oil, vitamin E oil  Silicone-Based Ingredients: Silicone polymers; staining; typically nonirritating, long lasting; waterproof; should not be used with silicone dilators, sexual toys, or gynecologic products Astroglide X, Oceanus Ultra Pure, Pink Silicone, Pjur Eros, Replens Silky Smooth, Silicone Premium JO, SKYN, Uberlube, [employer] Based Minimize harm to sperm  motility; designed for couples trying to conceive:  Astroglide TTC, Conceive Plus, PreSeed, Yes Baby  Fertility Friendly Minimize harm to sperm motility; designed for couples trying to conceive:  Astroglide, TTC, Conceive Plus, PreSeed, Yes Baby  Vaginal Moisturizers   Vaginal Moisturizers For maintenance use 1 to 3 times weekly; can benefit women with dryness, chafing with AOL, and recurrent vaginal infections irrespective of sexual activity timing Balance Active Menopause Vaginal Moisturizing Lubricant, Canesintima Intimate Moisturizer, Replens, Rephresh, Sylk Natural Intimate Moisturizer, Yes Vaginal Moisturizer  Hybrids Properties of both water and silicone-based  products (combination of a vaginal lubricant and moisturizer); Non-irritating; good option for women with allergies and sensitivities Lubrigyn, Luvena  Suppositories Hyaluronic acid to retain moisture Revaree  Vulvar Soothing Creams/Oils    Medicated Creams Pain and burn relief; Ingredients: 4% Lidocaine , Aloe Vera gel Releveum ([place])  Non-Medicated Creams For anti-itch and moisture/maintenance; Ingredients: Coconut oil, Avocado oil, Shea Butter, Olive oil, Vitamin E Vajuvenate, Vmagic  Oils for moisture/maintenance: Coconut oil, Vitamin E oil, Emu oil     Electronically signed by:  Lauraine KYM Oz, MSN, FNP-C, CUNP 12/13/2023 9:25 AM

## 2023-12-13 ENCOUNTER — Encounter: Payer: Self-pay | Admitting: Urology

## 2023-12-13 ENCOUNTER — Ambulatory Visit: Admitting: Urology

## 2023-12-13 VITALS — BP 153/83 | HR 84

## 2023-12-13 DIAGNOSIS — Z87440 Personal history of urinary (tract) infections: Secondary | ICD-10-CM

## 2023-12-13 DIAGNOSIS — N390 Urinary tract infection, site not specified: Secondary | ICD-10-CM | POA: Diagnosis not present

## 2023-12-13 LAB — URINALYSIS, ROUTINE W REFLEX MICROSCOPIC
Bilirubin, UA: NEGATIVE
Glucose, UA: NEGATIVE
Ketones, UA: NEGATIVE
Leukocytes,UA: NEGATIVE
Nitrite, UA: NEGATIVE
Protein,UA: NEGATIVE
Specific Gravity, UA: <1.005 — ABNORMAL LOW (ref 1.005–1.030)
Urobilinogen, Ur: 0.2 mg/dL (ref 0.2–1.0)
pH, UA: 6 (ref 5.0–7.5)

## 2023-12-13 LAB — MICROSCOPIC EXAMINATION
Bacteria, UA: NONE SEEN
WBC, UA: NONE SEEN /HPF (ref 0–5)

## 2023-12-13 LAB — BLADDER SCAN AMB NON-IMAGING: Scan Result: 22

## 2023-12-13 MED ORDER — TRIMETHOPRIM 100 MG PO TABS: 100.0000 mg | ORAL_TABLET | Freq: Every day | ORAL | 5 refills | Status: DC

## 2023-12-13 MED ORDER — ESTRADIOL 0.1 MG/GM VA CREA: TOPICAL_CREAM | VAGINAL | 3 refills | Status: DC

## 2023-12-13 NOTE — Patient Instructions (Signed)
 UTI prevention / management:  UTI symptoms may include:  - Pain / burning / discomfort when urinating - Recent increase in urinary urgency (how quickly you feel like you need to rush to the bathroom) - Recent increase in urinary frequency (how often you are urinating) - Fever - Acute mental status change / confusion - Fatigue / Feeling tired - Weakness - Note: Urine color, clarity, and odor are not considered to be clinically significant indicators of UTI and do not warrant urine testing unless patient is also experiencing UTI symptoms such as those listed above.  Difference between Urinalysis (urine dipstick test) and Urine culture / Why urine culture often needed to determine appropriate diagnosis and treatment of urologic symptoms: > Urinalysis (urine dipstick test): A quick office test used as an indicator to determine whether or not further testing is necessary (such as a urine culture, urine microscopy, etc.) The urinalysis cannot differentiate a true bacterial UTI or give a definitive diagnosis for the findings.  > Urine culture: May be performed based on the findings of a urinalysis to evaluate for UTI. Grows out on a petri dish for 48-72 hours. Provides important information about: whether or not bacterial growth is present and if so: what the predominant bacteria is which antibiotics will work best against that bacteria That information is important so that we can diagnose and treat patients appropriately as there are other conditions which may mimic UTls which must not be missed (such as cancer, interstitial cystitis, stones, etc.). Assists us  with antibiotic stewardship to minimize patient's risk for developing antibiotic resistance (getting to a point where no antibiotics work anymore).  Options when UTI symptoms occur: 1. Call [facility] [place] and request to speak with triage nurse (phone # [phone], select option 3). In accordance with clinic guidelines  the nurse will determine next steps based on patient-reported symptoms, which may include: same-day lab visit to provide urine specimen, recommendation to schedule Urology office visit appointment for further evaluation, recommendation to proceed to ER, etc. 2. Call your Primary Care Provider (PCP) office to request urgent / same-day visit. Be sure to request for urine culture to be ordered and have results faxed to Urology (fax # [phone]).  3. Go to urgent care. Be sure to request for urine culture to be ordered and have results faxed to Urology (fax # [phone]).   For bladder pain/ burning with urination: - Can take over-the-counter Pyridium (phenazopyridine; commonly known under the AZO brand) for a few days as needed. Limit use to no more than 3 days consecutively due to risk for methemoglobinemia, liver function issues, and bone health damage with long term use of Pyridium. - Alternative: Prescription urinary analgesics (such as Uribel, Urogesic blue, Urelle, Uro-MP). Often expensive / poorly covered by insurance unfortunately.  Options / recommendations for UTI prevention: - Low dose antibiotic daily for UTI prophylaxis. - Topical vaginal estrogen for vaginal atrophy (aka Genitourinary Syndrome of Menopause (GSM)). - Adequate daily fluid intake to flush out the urinary tract. - Go to the bathroom to urinate every 4-6 hours while awake to minimize urinary stasis / bacterial overgrowth in the bladder. - Proanthocyanidin (PAC) supplement 36 mg daily; must be soluble (insoluble form of PAC will be ineffective). Recommended brand: Ellura. This is an over-the-counter supplement (often must be found/ purchased online) supplement derived from cranberries with concentrated active component: Proanthocyanidin (PAC) 36 mg daily. Decreases bacterial adherence to bladder lining.  - D-mannose powder (2 grams daily). This is an over-the-counter supplement  which decreases bacterial adherence to bladder  lining (it is a sugar that inhibits bacterial adherence to urothelial cells by binding to the pili of enteric bacteria). Take as per manufacturer recommendation. Can be used as an alternative or in addition to the concentrated cranberry supplement.  - Vitamin C supplement to acidify urine to minimize bacterial growth.  - Probiotic to maintain healthy vaginal microbiome to suppress bacteria at urethral opening. Brand recommendations: Estill Hemming (includes probiotic & D-mannose ), Feminine Balance (highest concentration of lactobacillus) or Hyperbiotic Pro 15.  Note for patients with diabetes:  - Be aware that D-mannose contains sugar.  Note for patients with interstitial cystitis (IC):  - Patients with IC should typically avoid cranberry/ PAC supplements and Vitamin C supplements due to their acidity, which may exacerbate IC-related bladder pain. - Symptoms of true bacterial UTI can overlap / mimic symptoms of an IC flare up. Antibiotic use is NOT indicated for IC flare ups. Urine culture needed prior to antibiotic treatment for IC patients. The goal is to minimize your risk for developing antibiotic-resistant bacteria.    Vaginal atrophy I Genitourinary syndrome of menopause (GSM):  What it is: Changes in the vaginal environment (including the vulva and urethra) including: Thinning of the epithelium (skin/ mucosa surface) Can contribute to urinary urgency and frequency Can contribute to dryness, itching, irritation of the vulvar and vaginal tissue Can contribute to pain with intercourse Can contribute to physical changes of the labia, vulva, and vagina such as: Narrowing of the vaginal opening Decreased vaginal length Loss of labial architecture Labial adhesions Pale color of vulvovaginal tissue Loss of pubic hair Allows bacteria to become adherent Results in increased risk for urinary tract infection (UTI) due to bacterial overgrowth and migration up the urethra into the bladder Change in  vaginal pH (acid/ base balance) Allows for alteration / disruption of the normal bacterial flora / microbiome Results in increased risk for urinary tract infection (UTI) due to bacterial overgrowth  Treatment options: Over-the-counter lubricants (see list below). Prescription vaginal estrogen replacement. Options: Topical vaginal estrogen (estradiol ) cream: (Estrace , Premarin, compounded) Apply as directed Estring  vaginal ring Exchanged every 3 months (either at home or in office by provider) Vagifem  vaginal tablet Inserted nightly for 2 weeks then twice a week (long term) lntrarosa vaginal suppository Vaginal DHEA: converts to estrogen in vaginal tissue without systemic effect Inserted nightly (long term) 3. Vaginal laser therapy (Mona Edwina Gram touch) Performed in 3 treatments each 6 weeks apart (available in our [place] office). Can feel like a sunburn for 3-4 days after each treatment until new skin heals in. Usually not covered by insurance. Estimated cost is $1500 for all 3 sessions.  FYI regarding prescription vaginal estrogen treatment options: - All topical vaginal estrogen replacement options are equivalent in terms of efficacy. - Topical vaginal estrogen replacement will take about 3 months to be effective. - OK to have sex with any of the topical vaginal estrogen replacement options. - Topical vaginal estrogen replacement may sting/burn initially due to severe dryness, which will improve with ongoing treatment. - Studies have demonstrated negligible systemic absorption of low-dose intravaginal estrogen cream therefore the risk for cancer development or recurrence with this medication is minimal.  Genitourinary Syndrome of Menopause: AUA/SUFU/AUGS Guideline (2025) [place]  Topical vaginal estrogen cream safe to use with breast cancer  history [email or web address].[email or web address]  Topical vaginal estrogen cream safe to use with blood clot history [email or web address]   Lubricants and Moisturizers for Treating Genitourinary Syndrome of Menopause and Vulvovaginal Atrophy Treatment Comments I  Available Products   Lubricants   Water-based Ingredients: Deionized water, glycerin, propylene glycol; latex safe; rare irritation; dry out with extended sexual activity Astroglide, Good Clean Love, K-Y Jelly, Natural, Organic, Pink, Sliquid, Sylk, Yes    Oil Based Ingredients: avocado, olive, peanut, corn; latex safe; can be used with silicone products; staining; safe (unless peanut allergy); non-irritating Coconut oil, vegetable oil, vitamin E oil  Silicone-Based Ingredients: Silicone polymers; staining; typically nonirritating, long lasting; waterproof; should not be used with silicone dilators, sexual toys, or gynecologic products Astroglide X, Oceanus Ultra Pure, Pink Silicone, Pjur Eros, Replens Silky Smooth, Silicone Premium JO, SKYN, Uberlube, [employer] Based Minimize harm to sperm motility; designed for couples trying to conceive:  Astroglide TTC, Conceive Plus, PreSeed, Yes Baby  Fertility Friendly Minimize harm to sperm motility; designed for couples trying to conceive:  Astroglide, TTC, Conceive Plus, PreSeed, Yes Baby  Vaginal Moisturizers   Vaginal Moisturizers For maintenance use 1 to 3 times weekly; can benefit women with dryness, chafing with AOL, and recurrent vaginal infections irrespective of sexual activity timing Balance Active Menopause Vaginal Moisturizing Lubricant, Canesintima Intimate Moisturizer, Replens, Rephresh, Sylk Natural Intimate Moisturizer, Yes Vaginal  Moisturizer  Hybrids Properties of both water and silicone-based products (combination of a vaginal lubricant and moisturizer); Non-irritating; good option for women with allergies and sensitivities Lubrigyn, Luvena  Suppositories Hyaluronic acid to retain moisture Revaree  Vulvar Soothing Creams/Oils    Medicated Creams Pain and burn relief; Ingredients: 4% Lidocaine, Aloe Vera gel Releveum ([place])  Non-Medicated Creams For anti-itch and moisture/maintenance; Ingredients: Coconut oil, Avocado oil, Shea Butter, Olive oil, Vitamin E Vajuvenate, Vmagic  Oils for moisture/maintenance: Coconut oil, Vitamin E oil, Emu oil

## 2023-12-23 ENCOUNTER — Other Ambulatory Visit: Payer: Self-pay

## 2023-12-23 ENCOUNTER — Telehealth: Payer: Self-pay

## 2023-12-23 DIAGNOSIS — M25611 Stiffness of right shoulder, not elsewhere classified: Secondary | ICD-10-CM | POA: Diagnosis not present

## 2023-12-23 DIAGNOSIS — N390 Urinary tract infection, site not specified: Secondary | ICD-10-CM

## 2023-12-23 DIAGNOSIS — M25511 Pain in right shoulder: Secondary | ICD-10-CM | POA: Diagnosis not present

## 2023-12-23 MED ORDER — ESTRADIOL 0.1 MG/GM VA CREA: TOPICAL_CREAM | VAGINAL | 3 refills | Status: AC

## 2023-12-23 NOTE — Telephone Encounter (Signed)
 Open in error

## 2023-12-23 NOTE — Telephone Encounter (Signed)
-----   Message from [facility] Kourtney B sent at 12/23/2023  8:41 AM EDT ----- Refill request

## 2023-12-23 NOTE — Telephone Encounter (Signed)
 Called patient to confirm RX for which pharmacy. Patient state's she would like her estrace  cream to go to [facility] instead of [facility]. Patient is made aware Rx will be D/C at [facility] and reorder for [facility]. Patient voiced understanding.

## 2023-12-25 DIAGNOSIS — M25511 Pain in right shoulder: Secondary | ICD-10-CM | POA: Diagnosis not present

## 2023-12-25 DIAGNOSIS — M25611 Stiffness of right shoulder, not elsewhere classified: Secondary | ICD-10-CM | POA: Diagnosis not present

## 2023-12-26 ENCOUNTER — Ambulatory Visit (HOSPITAL_COMMUNITY)

## 2023-12-26 ENCOUNTER — Other Ambulatory Visit: Payer: Self-pay

## 2023-12-26 ENCOUNTER — Encounter (HOSPITAL_COMMUNITY): Payer: Self-pay | Admitting: Internal Medicine

## 2023-12-26 ENCOUNTER — Ambulatory Visit (HOSPITAL_COMMUNITY)
Admission: RE | Admit: 2023-12-26 | Discharge: 2023-12-26 | Disposition: A | Discharge status: Home / Self Care | Attending: Internal Medicine | Admitting: Internal Medicine

## 2023-12-26 ENCOUNTER — Encounter (HOSPITAL_COMMUNITY)
Admission: RE | Disposition: A | Discharge status: Home / Self Care | Payer: Self-pay | Source: Home / Self Care | Attending: Internal Medicine

## 2023-12-26 DIAGNOSIS — K229 Disease of esophagus, unspecified: Secondary | ICD-10-CM

## 2023-12-26 DIAGNOSIS — Z79899 Other long term (current) drug therapy: Secondary | ICD-10-CM | POA: Diagnosis not present

## 2023-12-26 DIAGNOSIS — M1990 Unspecified osteoarthritis, unspecified site: Secondary | ICD-10-CM | POA: Insufficient documentation

## 2023-12-26 DIAGNOSIS — Z853 Personal history of malignant neoplasm of breast: Secondary | ICD-10-CM | POA: Insufficient documentation

## 2023-12-26 DIAGNOSIS — K317 Polyp of stomach and duodenum: Secondary | ICD-10-CM

## 2023-12-26 DIAGNOSIS — F419 Anxiety disorder, unspecified: Secondary | ICD-10-CM | POA: Insufficient documentation

## 2023-12-26 DIAGNOSIS — M797 Fibromyalgia: Secondary | ICD-10-CM | POA: Diagnosis not present

## 2023-12-26 DIAGNOSIS — K5900 Constipation, unspecified: Secondary | ICD-10-CM | POA: Insufficient documentation

## 2023-12-26 DIAGNOSIS — Z8673 Personal history of transient ischemic attack (TIA), and cerebral infarction without residual deficits: Secondary | ICD-10-CM | POA: Diagnosis not present

## 2023-12-26 DIAGNOSIS — E785 Hyperlipidemia, unspecified: Secondary | ICD-10-CM | POA: Diagnosis not present

## 2023-12-26 DIAGNOSIS — Z8616 Personal history of COVID-19: Secondary | ICD-10-CM | POA: Diagnosis not present

## 2023-12-26 DIAGNOSIS — K219 Gastro-esophageal reflux disease without esophagitis: Secondary | ICD-10-CM | POA: Insufficient documentation

## 2023-12-26 DIAGNOSIS — R12 Heartburn: Secondary | ICD-10-CM | POA: Diagnosis not present

## 2023-12-26 DIAGNOSIS — D6800 Von Willebrand disease, unspecified: Secondary | ICD-10-CM | POA: Insufficient documentation

## 2023-12-26 DIAGNOSIS — I10 Essential (primary) hypertension: Secondary | ICD-10-CM | POA: Insufficient documentation

## 2023-12-26 HISTORY — PX: ESOPHAGOGASTRODUODENOSCOPY: SHX5428

## 2023-12-26 SURGERY — EGD (ESOPHAGOGASTRODUODENOSCOPY)
Anesthesia: General

## 2023-12-26 MED ORDER — LIDOCAINE 2% (20 MG/ML) 5 ML SYRINGE
INTRAMUSCULAR | Status: DC | PRN
Start: 2023-12-26 — End: 2023-12-26
  Administered 2023-12-26: 60 mg via INTRAVENOUS

## 2023-12-26 MED ORDER — LACTATED RINGERS IV SOLN: INTRAVENOUS | Status: DC | PRN

## 2023-12-26 MED ORDER — PROPOFOL 500 MG/50ML IV EMUL
INTRAVENOUS | Status: DC | PRN
Start: 2023-12-26 — End: 2023-12-26
  Administered 2023-12-26: 20 mg via INTRAVENOUS
  Administered 2023-12-26: 125 ug/kg/min via INTRAVENOUS
  Administered 2023-12-26: 80 mg via INTRAVENOUS

## 2023-12-26 NOTE — Op Note (Signed)
 [facility] Patient Name: Caroline Valdez Procedure Date: 12/26/2023 11:03 AM MRN: [id number] Date of Birth: [birth year] Attending MD: [name] , MD, [id number] CSN: [id number] Age: 73 Admit Type: Outpatient Procedure:                Upper GI endoscopy Indications:              Heartburn Providers:                [name], MD, Jon LABOR. Gerome RN, RN,                            Dorcas Lenis, Technician Referring MD:              Medicines:                Propofol  per Anesthesia Complications:            No immediate complications. Estimated Blood Loss:     Estimated blood loss was minimal. Procedure:                Pre-Anesthesia Assessment:                           - Prior to the procedure, a History and Physical                            was performed, and patient medications and                            allergies were reviewed. The patient's tolerance of                            previous anesthesia was also reviewed. The risks                            and benefits of the procedure and the sedation                            options and risks were discussed with the patient.                            All questions were answered, and informed consent                            was obtained. Prior Anticoagulants: The patient has                            taken no anticoagulant or antiplatelet agents. ASA                            Grade Assessment: III - A patient with severe                            systemic disease. After reviewing the risks and  benefits, the patient was deemed in satisfactory                            condition to undergo the procedure.                           After obtaining informed consent, the endoscope was                            passed under direct vision. Throughout the                            procedure, the patient's blood pressure, pulse, and                            oxygen  saturations were monitored continuously. The                            GIF-H190 ([id number]) scope was introduced through the                            mouth, and advanced to the second part of duodenum.                            The upper GI endoscopy was accomplished without                            difficulty. The patient tolerated the procedure                            well. Scope In: 11:17:35 AM Scope Out: 11:26:54 AM Total Procedure Duration: 0 hours 9 minutes 19 seconds  Findings:      The examined esophagus was normal except for a 5 mm vascular bleb in the       proximal esophagus. Multiple fundic gland appearing polyps throughout       the gastric mucosa. No ulcer or infiltrating process. Largest polyp       approximately 8 mm. Pylorus patent easily traversed.      The duodenal bulb and second portion of the duodenum were normal. One of       the largest gastric polyps cold biopsy removed with cold biopsy forceps.       Finally, mid and distal esophagus were biopsied for histologic study Impression:               - Normal esophagus (vascular bleb). Biopsied.                            Gastric polyp -removed with cold biopsy forceps                           - Normal duodenal bulb and second portion of the                            duodenum.                           -  This nice lady denies ever having a great                            response to a variety of PPIs. Moreover, when she                            takes Tums she does not necessarily get meaningful                            relief. Recently 2 weeks course of Voquenza - .                            Felt esophageal burning was worse                           - May need pH/impedance study OFF acid suppression                            therapy Moderate Sedation:      Moderate (conscious) sedation was personally administered by an       anesthesia professional. The following parameters were monitored: oxygen        saturation, heart rate, blood pressure, respiratory rate, EKG, adequacy       of pulmonary ventilation, and response to care. Recommendation:           - Patient has a contact number available for                            emergencies. The signs and symptoms of potential                            delayed complications were discussed with the                            patient. Return to normal activities tomorrow.                            Written discharge instructions were provided to the                            patient.                           - Advance diet as tolerated. Further                            recommendations to follow. Follow-up on pathology. Procedure Code(s):        --- Professional ---                           [phone], Esophagogastroduodenoscopy, flexible,                            transoral; diagnostic, including collection of  specimen(s) by brushing or washing, when performed                            (separate procedure) Diagnosis Code(s):        --- Professional ---                           R12, Heartburn CPT copyright 2022 American Medical Association. All rights reserved. The codes documented in this report are preliminary and upon coder review may  be revised to meet current compliance requirements. [name]. [name], MD [name], MD 12/26/2023 11:34:18 AM This report has been signed electronically. Number of Addenda: 0

## 2023-12-26 NOTE — Anesthesia Preprocedure Evaluation (Addendum)
 Anesthesia Evaluation  Patient identified by MRN, date of birth, ID band Patient awake    Reviewed: Allergy & Precautions, H&P , NPO status , Patient's Chart, lab work & pertinent test results  History of Anesthesia Complications (+) history of anesthetic complications  Airway Mallampati: II  TM Distance: >3 FB Neck ROM: Full    Dental no notable dental hx.    Pulmonary neg pulmonary ROS   Pulmonary exam normal breath sounds clear to auscultation       Cardiovascular hypertension, Normal cardiovascular exam Rhythm:Regular Rate:Normal     Neuro/Psych   Anxiety     TIA 2014 no residuals  Neuromuscular disease CVA  negative psych ROS   GI/Hepatic Neg liver ROS,GERD  ,,  Endo/Other  negative endocrine ROS    Renal/GU negative Renal ROS  negative genitourinary   Musculoskeletal  (+) Arthritis ,  Fibromyalgia -  Abdominal   Peds negative pediatric ROS (+)  Hematology  (+) Blood dyscrasia   Anesthesia Other Findings   Reproductive/Obstetrics negative OB ROS                              Anesthesia Physical Anesthesia Plan  ASA: 2  Anesthesia Plan: General   Post-op Pain Management:    Induction: Intravenous  PONV Risk Score and Plan:   Airway Management Planned: Nasal Cannula  Additional Equipment:   Intra-op Plan:   Post-operative Plan:   Informed Consent: I have reviewed the patients History and Physical, chart, labs and discussed the procedure including the risks, benefits and alternatives for the proposed anesthesia with the patient or authorized representative who has indicated his/her understanding and acceptance.     Dental advisory given  Plan Discussed with: CRNA  Anesthesia Plan Comments:          Anesthesia Quick Evaluation

## 2023-12-26 NOTE — Anesthesia Procedure Notes (Signed)
 Date/Time: 12/26/2023 11:12 AM  Performed by: Para Jerelene CROME, CRNAOxygen Delivery Method: Nasal cannula

## 2023-12-26 NOTE — Anesthesia Postprocedure Evaluation (Signed)
 Anesthesia Post Note  Patient: Caroline Valdez  Procedure(s) Performed: EGD (ESOPHAGOGASTRODUODENOSCOPY)  Patient location during evaluation: PACU Anesthesia Type: General Level of consciousness: awake and alert Pain management: pain level controlled Vital Signs Assessment: post-procedure vital signs reviewed and stable Respiratory status: spontaneous breathing, nonlabored ventilation, respiratory function stable and patient connected to nasal cannula oxygen Cardiovascular status: stable and blood pressure returned to baseline Postop Assessment: no apparent nausea or vomiting Anesthetic complications: no   No notable events documented.   Last Vitals:  Vitals:   12/26/23 1050 12/26/23 1131  BP: (!) 148/76 132/77  Pulse: 88 86  Resp: 19 20  Temp: 36.8 C (!) 36.4 C  SpO2: 96% 95%    Last Pain:  Vitals:   12/26/23 1131  TempSrc: Axillary  PainSc: 0-No pain                 Andrea Limes

## 2023-12-26 NOTE — Discharge Instructions (Signed)
 EGD Discharge instructions Please read the instructions outlined below and refer to this sheet in the next few weeks. These discharge instructions provide you with general information on caring for yourself after you leave the hospital. Your doctor may also give you specific instructions. While your treatment has been planned according to the most current medical practices available, unavoidable complications occasionally occur. If you have any problems or questions after discharge, please call your doctor. ACTIVITY You may resume your regular activity but move at a slower pace for the next 24 hours.  Take frequent rest periods for the next 24 hours.  Walking will help expel (get rid of) the air and reduce the bloated feeling in your abdomen.  No driving for 24 hours (because of the anesthesia (medicine) used during the test).  You may shower.  Do not sign any important legal documents or operate any machinery for 24 hours (because of the anesthesia used during the test).  NUTRITION Drink plenty of fluids.  You may resume your normal diet.  Begin with a light meal and progress to your normal diet.  Avoid alcoholic beverages for 24 hours or as instructed by your caregiver.  MEDICATIONS You may resume your normal medications unless your caregiver tells you otherwise.  WHAT YOU CAN EXPECT TODAY You may experience abdominal discomfort such as a feeling of fullness or "gas" pains.  FOLLOW-UP Your doctor will discuss the results of your test with you.  SEEK IMMEDIATE MEDICAL ATTENTION IF ANY OF THE FOLLOWING OCCUR: Excessive nausea (feeling sick to your stomach) and/or vomiting.  Severe abdominal pain and distention (swelling).  Trouble swallowing.  Temperature over 101 F (37.8 C).  Rectal bleeding or vomiting of blood.      A polyp was removed from your stomach     your esophagus was biopsied  Further recommendations to follow pending review of pathology report

## 2023-12-26 NOTE — Transfer of Care (Addendum)
 Immediate Anesthesia Transfer of Care Note  Patient: Caroline Valdez  Procedure(s) Performed: EGD (ESOPHAGOGASTRODUODENOSCOPY)  Patient Location: Endoscopy Unit  Anesthesia Type:General  Level of Consciousness: drowsy and patient cooperative  Airway & Oxygen Therapy: Patient Spontanous Breathing  Post-op Assessment: Report given to RN and Post -op Vital signs reviewed and stable  Post vital signs: Reviewed and stable  Last Vitals:  Vitals Value Taken Time  BP 132/77 12/26/23   1131  Temp 36.4 12/26/23   1131  Pulse 86 12/26/23   1131  Resp 20 12/26/23   1131  SpO2 95% 12/26/23   1131    Last Pain:  Vitals:   12/26/23 1115  TempSrc:   PainSc: 3          Complications: No notable events documented.

## 2023-12-26 NOTE — Interval H&P Note (Signed)
 History and Physical Interval Note:  12/26/2023 11:05 AM  Caroline Valdez  has presented today for surgery, with the diagnosis of gerd, esophageal burning.  The various methods of treatment have been discussed with the patient and family. After consideration of risks, benefits and other options for treatment, the patient has consented to  Procedure(s) with comments: EGD (ESOPHAGOGASTRODUODENOSCOPY) (N/A) - 1230pm, asa 2 as a surgical intervention.  The patient's history has been reviewed, patient examined, no change in status, stable for surgery.  I have reviewed the patient's chart and labs.  Questions were answered to the patient's satisfaction.     Chin Wachter     No dysphagia.  Esophageal burning worse with Voqenza which she took for 3 weeks.  Diagnostic EGD today per plan.  The risks, benefits, limitations, alternatives and imponderables have been reviewed with the patient. Potential for esophageal dilation, biopsy, etc. have also been reviewed.  Questions have been answered. All parties agreeable.    no change.  Diagnostic EGD today.

## 2023-12-27 ENCOUNTER — Encounter (HOSPITAL_COMMUNITY): Payer: Self-pay | Admitting: Internal Medicine

## 2023-12-27 LAB — SURGICAL PATHOLOGY

## 2023-12-31 DIAGNOSIS — M25611 Stiffness of right shoulder, not elsewhere classified: Secondary | ICD-10-CM | POA: Diagnosis not present

## 2023-12-31 DIAGNOSIS — M25511 Pain in right shoulder: Secondary | ICD-10-CM | POA: Diagnosis not present

## 2024-01-01 ENCOUNTER — Ambulatory Visit: Payer: Self-pay | Admitting: Internal Medicine

## 2024-01-02 DIAGNOSIS — M25511 Pain in right shoulder: Secondary | ICD-10-CM | POA: Diagnosis not present

## 2024-01-02 DIAGNOSIS — M25611 Stiffness of right shoulder, not elsewhere classified: Secondary | ICD-10-CM | POA: Diagnosis not present

## 2024-01-03 ENCOUNTER — Other Ambulatory Visit: Payer: Self-pay

## 2024-01-03 DIAGNOSIS — N390 Urinary tract infection, site not specified: Secondary | ICD-10-CM

## 2024-01-03 MED ORDER — TRIMETHOPRIM 100 MG PO TABS: 100.0000 mg | ORAL_TABLET | Freq: Every day | ORAL | 5 refills | Status: DC

## 2024-01-03 NOTE — Telephone Encounter (Signed)
 Called patient and rescheduled appointment with Dr. Matilda. Patient is made aware Rx is going to be sent to MD for medication. Patient is made aware that our NP is no longer with our practice and was rescheduled with [facility] next available appointment.

## 2024-01-03 NOTE — Telephone Encounter (Signed)
 Patient made aware and voiced understanding. Rx for Trimethoprim  [facility]

## 2024-01-07 DIAGNOSIS — M25611 Stiffness of right shoulder, not elsewhere classified: Secondary | ICD-10-CM | POA: Diagnosis not present

## 2024-01-07 DIAGNOSIS — M25511 Pain in right shoulder: Secondary | ICD-10-CM | POA: Diagnosis not present

## 2024-01-09 ENCOUNTER — Other Ambulatory Visit: Payer: Self-pay

## 2024-01-09 DIAGNOSIS — M25611 Stiffness of right shoulder, not elsewhere classified: Secondary | ICD-10-CM | POA: Diagnosis not present

## 2024-01-09 DIAGNOSIS — N390 Urinary tract infection, site not specified: Secondary | ICD-10-CM

## 2024-01-09 DIAGNOSIS — M25511 Pain in right shoulder: Secondary | ICD-10-CM | POA: Diagnosis not present

## 2024-01-09 NOTE — Telephone Encounter (Signed)
 Patient needing 90 day supply of:  trimethoprim  (TRIMPEX ) 100 MG tablet   estradiol  (ESTRACE ) 0.1 MG/GM vaginal cream   Cost is less with 90 day supply.   Pharmacy:  [facility] - [place], [place] - [address] Phone: [phone]  Fax: [phone]

## 2024-01-10 ENCOUNTER — Other Ambulatory Visit: Payer: Self-pay | Admitting: Family Medicine

## 2024-01-10 MED ORDER — TRIMETHOPRIM 100 MG PO TABS: 100.0000 mg | ORAL_TABLET | Freq: Every day | ORAL | 1 refills | Status: DC

## 2024-01-10 NOTE — Telephone Encounter (Signed)
 Requested medications are due for refill today.  yes  Requested medications are on the active medications list.  yes  Last refill. 11/04/2023 #60 0 rf  Future visit scheduled.   Pt has a lab appt scheduled.  Notes to clinic.  Refill not delegated.    Requested Prescriptions  Pending Prescriptions Disp Refills   traMADol  (ULTRAM ) 50 MG tablet [Pharmacy Med Name: TRAMADOL  50MG  TABLETS] 60 tablet     Sig: TAKE 1 TABLET(50 MG) BY MOUTH EVERY 6 HOURS AS NEEDED     Not Delegated - Analgesics:  Opioid Agonists Failed - 01/10/2024  5:20 PM      Failed - This refill cannot be delegated      Failed - Urine Drug Screen completed in last 360 days      Failed - Valid encounter within last 3 months    Recent Outpatient Visits           3 months ago Dysuria   [place] [facility] Aletha Bene, MD   3 months ago Pure hypercholesterolemia   [place] [facility] Duanne Butler DASEN, MD   6 months ago Viral upper respiratory tract infection   [place] [facility] Aletha Bene, MD   6 months ago Suspected UTI   [place] [facility] Duanne Butler DASEN, MD   7 months ago Acute non-recurrent frontal sinusitis   [place] [facility] Kayla Jeoffrey RAMAN, FNP       Future Appointments             In 3 months Matilda Senior, MD [facility] [place]

## 2024-01-12 ENCOUNTER — Other Ambulatory Visit: Payer: Self-pay | Admitting: Family Medicine

## 2024-01-12 DIAGNOSIS — K219 Gastro-esophageal reflux disease without esophagitis: Secondary | ICD-10-CM

## 2024-01-13 DIAGNOSIS — Z4889 Encounter for other specified surgical aftercare: Secondary | ICD-10-CM | POA: Diagnosis not present

## 2024-01-14 NOTE — Telephone Encounter (Signed)
 Requested Prescriptions  Pending Prescriptions Disp Refills   pantoprazole  (PROTONIX ) 40 MG tablet [Pharmacy Med Name: PANTOPRAZOLE  40MG  TABLETS] 180 tablet 2    Sig: TAKE 1 TABLET(40 MG) BY MOUTH TWICE DAILY     Gastroenterology: Proton Pump Inhibitors Passed - 01/14/2024 11:18 AM      Passed - Valid encounter within last 12 months    Recent Outpatient Visits           3 months ago Dysuria   [place] [facility] Aletha Bene, MD   3 months ago Pure hypercholesterolemia   [place] [facility] Duanne Butler DASEN, MD   6 months ago Viral upper respiratory tract infection   [place] [facility] Aletha Bene, MD   6 months ago Suspected UTI   [place] [facility] Duanne Butler DASEN, MD   7 months ago Acute non-recurrent frontal sinusitis   [place] [facility] Kayla Jeoffrey RAMAN, FNP       Future Appointments             In 3 months Matilda Senior, MD [facility] [place]

## 2024-01-30 ENCOUNTER — Ambulatory Visit (INDEPENDENT_AMBULATORY_CARE_PROVIDER_SITE_OTHER): Admitting: Family Medicine

## 2024-01-30 ENCOUNTER — Telehealth: Payer: Self-pay

## 2024-01-30 ENCOUNTER — Encounter: Payer: Self-pay | Admitting: Family Medicine

## 2024-01-30 VITALS — BP 126/76 | HR 87 | Temp 97.7°F | Ht 65.0 in | Wt 156.6 lb

## 2024-01-30 DIAGNOSIS — M19041 Primary osteoarthritis, right hand: Secondary | ICD-10-CM | POA: Diagnosis not present

## 2024-01-30 DIAGNOSIS — M19042 Primary osteoarthritis, left hand: Secondary | ICD-10-CM

## 2024-01-30 MED ORDER — MELOXICAM 15 MG PO TABS: 15.0000 mg | ORAL_TABLET | Freq: Every day | ORAL | 5 refills | Status: DC

## 2024-01-30 NOTE — Progress Notes (Signed)
 Subjective:    Patient ID: Caroline Valdez, female    DOB: 1950/12/09, 73 y.o.   MRN: [id number] Patient reports pain in her hands on a daily basis.  She reports pain and stiffness in the DIP joints of the left 2nd, 3rd and 4th fingers.  She also complains of pain and stiffness in the MCP joints of both hands.  She has Heberden's nodules over the DIP joints of the 2nd through 4th fingers of both hands.  She has a history of von Willebrand's disease and last while she was told not to take an aspirin however she has not had any significant bleeding or bruising in the years that I have known her.  She is not currently taking any NSAID.  Tylenol  is not helping. Past Medical History:  Diagnosis Date   Allergy 2015?   Anxiety 2020?   Nighttime panic attacks   Arthritis    Blood dyscrasia    Von Willebrand factor    Blood transfusion without reported diagnosis 1967, 1974   Breast cancer (HCC)    left   Clotting disorder (HCC) Von Willenbrand   Complication of anesthesia    1980S  TUBAL LIG, + HEMM SURGERY PT PASSED OUT AND WENT INTO SHOCK.  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    Endometriosis    Fibromyalgia    GERD (gastroesophageal reflux disease)    Hypercholesteremia    Hypertension    Mini stroke 09/2012   Stroke [facility])    MINI STROKE   Von Willebrand disease (HCC) 09/11/2013   Past Surgical History:  Procedure Laterality Date   ABDOMINAL HYSTERECTOMY     AUGMENTATION MAMMAPLASTY Right    BACK SURGERY     BREAST IMPLANT REMOVAL Right 12/05/2017   BREAST IMPLANT REMOVAL Right 12/05/2017   Procedure: REMOVAL RIGHT  BREAST IMPLANT MATERIAL;  Surgeon: Leora Lenis, MD;  Location: [facility] OR;  Service: Plastics;  Laterality: Right;   BREAST RECONSTRUCTION     BREAST RECONSTRUCTION Right 12/05/2017   Procedure: DELAYED BREAST RECONSTRUCTION WITH SILICONE IMPLANT;  Surgeon: Leora Lenis, MD;  Location: [facility] OR;  Service: Plastics;  Laterality: Right;   CARPAL  TUNNEL RELEASE     right   COLONOSCOPY  01/2010   Dr. Oneil Budge: normal   COLONOSCOPY WITH PROPOFOL  N/A 01/22/2019   Procedure: COLONOSCOPY WITH PROPOFOL ;  Surgeon: Shaaron [name], MD;  Location[facility] ENDO SU[facility];  Service: Endoscopy;  Laterality: N/A;  2:30pm - can not come earlier due to transportation   COSMETIC SURGERY  2011,   Breast   ESOPHAGOGASTRODUODENOSCOPY N/A 12/26/2023   Procedure: EGD (ESOPHAGOGASTRODUODENOSCOPY);  Surgeon: Shaaron [name], MD;  Location: [facility] SUITE;  Service: Endoscopy;  Laterality: N/A;  1230pm, asa 2   FINGER SURGERY     LEFT THUMB   HEMORRHOID SURGERY     X2   LAMINECTOMY     MASTECTOMY     left   SHOULDER ARTHROSCOPY WITH ROTATOR CUFF REPAIR Right 08/28/2023   Procedure: REVISION MINI-OPEN ROTATOR CUFF REPAIR,RIGHT SHOULDER, WITH PATCH GRAFT;  Surgeon: Duwayne Purchase, MD;  Location: [facility] ORS;  Service: Orthopedics;  Laterality: Right;  Revision right shoulder mini open rotator cuff repair with patch graft   SHOULDER SURGERY Right    SKIN CANCER EXCISION     nasal basal cell lesion,    Mohs surgery   SPINE SURGERY  1989   TONSILLECTOMY     TUBAL LIGATION  Current Outpatient Medications on File Prior to Visit  Medication Sig Dispense Refill   acetaminophen  (TYLENOL ) 500 MG tablet Take 1,000 mg by mouth every 6 (six) hours as needed for headache or moderate pain (pain score 4-6).     ALPRAZolam  (XANAX ) 0.5 MG tablet TAKE 1 TABLET(0.5 MG) BY MOUTH AT BEDTIME AS NEEDED (Patient taking differently: No sig reported) 60 tablet 1   aspirin EC 81 MG tablet Take 81 mg by mouth in the morning. Swallow whole.     estradiol  (ESTRACE ) 0.1 MG/GM vaginal cream Discard plastic applicator. Insert a blueberry size amount (approximately 1 gram) of cream on fingertip inside vagina at bedtime every night for 1 week then every other night. For long term use. 30 g 3   famotidine  (PEPCID ) 20 MG tablet TAKE 1 TABLET BY MOUTH AT LEAST 1 HOUR BEFORE BEDTIME 30 tablet 11    fluticasone  (FLONASE ) 50 MCG/ACT nasal spray Place 2 sprays into both nostrils daily. 48 g 3   montelukast  (SINGULAIR ) 10 MG tablet TAKE 1 TABLET AT BEDTIME 90 tablet 2   Multiple Vitamin (MULTIVITAMIN WITH MINERALS) TABS tablet Take 1 tablet by mouth in the morning.     Multiple Vitamins-Minerals (EQ VISION FORMULA 50+ PO) Take 1 tablet by mouth daily.     ondansetron  (ZOFRAN ) 4 MG tablet TAKE 1 TABLET(4 MG) BY MOUTH EVERY 8 HOURS AS NEEDED FOR NAUSEA OR VOMITING 20 tablet 0   pantoprazole  (PROTONIX ) 40 MG tablet TAKE 1 TABLET(40 MG) BY MOUTH TWICE DAILY 180 tablet 2   Plecanatide  (TRULANCE ) 3 MG TABS Take 1 tablet (3 mg total) by mouth daily. 90 tablet 3   rosuvastatin  (CRESTOR ) 20 MG tablet Take 1 tablet (20 mg total) by mouth daily. 90 tablet 2   traMADol  (ULTRAM ) 50 MG tablet Take 1 tablet (50 mg total) by mouth every 6 (six) hours as needed. 60 tablet 0   trimethoprim  (TRIMPEX ) 100 MG tablet Take 1 tablet (100 mg total) by mouth daily. 90 tablet 1   No current facility-administered medications on file prior to visit.   Allergies  Allergen Reactions   Aspirin Other (See Comments)    Pt has Von Willebrands Disease   Hydrocodone  Itching   Oxycodone  Itching   Tape Dermatitis    Pulls off skin   Social History   Socioeconomic History   Marital status: Widowed    Spouse name: Not on file   Number of children: 1   Years of education: Not on file   Highest education level: 12th grade  Occupational History   Not on file  Tobacco Use   Smoking status: Never   Smokeless tobacco: Never  Vaping Use   Vaping status: Never Used  Substance and Sexual Activity   Alcohol use: No   Drug use: No   Sexual activity: Not Currently    Comment: husband recently died from myelodysplastic syndrome in January of 2014  Other Topics Concern   Not on file  Social History Narrative   1 Daughter, April   1 grandson-20, 1 granddaughter-4.   Keeps granddaughter during the week.   Social Drivers  of Health   Financial Resource Strain: Patient Declined (01/29/2024)   Overall Financial Resource Strain (CARDIA)    Difficulty of Paying Living Expenses: Patient declined  Food Insecurity: Patient Declined (01/29/2024)   Hunger Vital Sign    Worried About Running Out of Food in the Last Year: Patient declined    Ran Out of Food in the Last Year:  Patient declined  Transportation Needs: No Transportation Needs (01/29/2024)   PRAPARE - Administrator, Civil Service (Medical): No    Lack of Transportation (Non-Medical): No  Physical Activity: Insufficiently Active (01/29/2024)   Exercise Vital Sign    Days of Exercise per Week: 3 days    Minutes of Exercise per Session: 10 min  Stress: Stress Concern Present (01/29/2024)   [employer] of Occupational Health - Occupational Stress Questionnaire    Feeling of Stress: To some extent  Social Connections: Unknown (01/29/2024)   Social Connection and Isolation Panel    Frequency of Communication with Friends and Family: Patient declined    Frequency of Social Gatherings with Friends and Family: Patient declined    Attends Religious Services: Not on Marketing executive or Organizations: Patient declined    Attends Banker Meetings: Not on file    Marital Status: Widowed  Intimate Partner Violence: Not At Risk (06/18/2023)   Humiliation, Afraid, Rape, and Kick questionnaire    Fear of Current or Ex-Partner: No    Emotionally Abused: No    Physically Abused: No    Sexually Abused: No   Family History  Problem Relation Age of Onset   Breast cancer Mother 8       now 64yo   Arthritis Mother    Cancer Mother    Hearing loss Mother    Vision loss Mother    Varicose Veins Mother    Stroke Father        Father had no full sisters   Early death Father    Hearing loss Father    Breast cancer Sister 69       now 69yo; had TAH/BSO   Cancer Sister    Hyperlipidemia Sister    Varicose Veins Sister     Lung cancer Brother        died 74yo; smoker   Asthma Daughter      Review of Systems  All other systems reviewed and are negative.      Objective:   Physical Exam Vitals reviewed.  Constitutional:      General: She is not in acute distress.    Appearance: She is well-developed. She is not diaphoretic.  HENT:     Head: Normocephalic and atraumatic.     Right Ear: Tympanic membrane and ear canal normal.     Left Ear: Tympanic membrane and ear canal normal.     Nose:     Right Turbinates: Not enlarged.     Left Turbinates: Not enlarged.     Right Sinus: No maxillary sinus tenderness or frontal sinus tenderness.     Left Sinus: No maxillary sinus tenderness or frontal sinus tenderness.  Eyes:     Conjunctiva/sclera: Conjunctivae normal.  Cardiovascular:     Rate and Rhythm: Normal rate and regular rhythm.     Heart sounds: Normal heart sounds. No murmur heard.    No friction rub. No gallop.  Pulmonary:     Effort: Pulmonary effort is normal. No respiratory distress.     Breath sounds: Normal breath sounds. No wheezing or rales.  Chest:     Chest wall: No tenderness.  Musculoskeletal:     Right hand: Tenderness and bony tenderness present. Decreased range of motion. Normal strength. Normal sensation.     Left hand: Tenderness and bony tenderness present. Decreased range of motion. Normal strength. Normal sensation.  Assessment & Plan:  Osteoarthritis of fingers of both hands Try meloxicam  15 mg daily.  Renal function is excellent in this patient.  Monitor for any GI toxicity.  We discussed slight increased risk of bleeding.  She will monitor for any bleeding or bruising.

## 2024-01-30 NOTE — Telephone Encounter (Signed)
 Copied from CRM [id number]. Topic: General - Billing Inquiry >> Jan 24, 2024  9:50 AM Sophia H wrote: Reason for CRM: Patient is following back up on a bill that she received for an AWV/physical. States she believes it was coded incorrectly, has already spoken with billing 4 times and has not been resolved. Now wants to speak with the office directly.. Called [facility] but placed on hold and couldn't wait too long. Please return patients call today by the end of the day. I'm unsure if there is someone in office who handles billing issues or if she can only speak with the billing department which she states she has already done. # [phone]

## 2024-02-03 ENCOUNTER — Other Ambulatory Visit: Payer: Self-pay | Admitting: Family Medicine

## 2024-02-03 DIAGNOSIS — E7800 Pure hypercholesterolemia, unspecified: Secondary | ICD-10-CM

## 2024-02-05 DIAGNOSIS — H43393 Other vitreous opacities, bilateral: Secondary | ICD-10-CM | POA: Diagnosis not present

## 2024-02-14 ENCOUNTER — Other Ambulatory Visit: Payer: Self-pay | Admitting: Family Medicine

## 2024-02-14 DIAGNOSIS — F4320 Adjustment disorder, unspecified: Secondary | ICD-10-CM

## 2024-02-14 DIAGNOSIS — Z0000 Encounter for general adult medical examination without abnormal findings: Secondary | ICD-10-CM

## 2024-02-14 NOTE — Telephone Encounter (Signed)
 Requested medications are due for refill today.  yes  Requested medications are on the active medications list.  yes  Last refill. Xanax  07/04/2023 #60 1 rf, Tramadol  01/14/2024 #60 0 rf  Future visit scheduled.   Only with the lab  Notes to clinic.  Refills not delegated.    Requested Prescriptions  Pending Prescriptions Disp Refills   ALPRAZolam  (XANAX ) 0.5 MG tablet [Pharmacy Med Name: ALPRAZOLAM  0.5MG  TABLETS] 60 tablet     Sig: TAKE 1 TABLET(0.5 MG) BY MOUTH AT BEDTIME AS NEEDED     Not Delegated - Psychiatry: Anxiolytics/Hypnotics 2 Failed - 02/14/2024  6:00 PM      Failed - This refill cannot be delegated      Failed - Urine Drug Screen completed in last 360 days      Passed - Patient is not pregnant      Passed - Valid encounter within last 6 months    Recent Outpatient Visits           2 weeks ago Osteoarthritis of fingers of both hands   [place] [facility] Pickard, Butler DASEN, MD   4 months ago Dysuria   [place] [facility] Aletha Bene, MD   4 months ago Pure hypercholesterolemia   [place] [facility] Duanne Butler DASEN, MD   7 months ago Viral upper respiratory tract infection   [place] [facility] Aletha Bene, MD   7 months ago Suspected UTI   [place] [facility] Pickard, Butler DASEN, MD       Future Appointments             In 2 months Matilda Senior, MD [facility] [place]             traMADol  (ULTRAM ) 50 MG tablet [Pharmacy Med Name: TRAMADOL  50MG  TABLETS] 60 tablet     Sig: TAKE 1 TABLET(50 MG) BY MOUTH EVERY 6 HOURS AS NEEDED     Not Delegated - Analgesics:  Opioid Agonists Failed - 02/14/2024  6:00 PM      Failed - This refill cannot be delegated      Failed - Urine Drug Screen completed in last 360 days      Failed - Valid encounter within last 3 months    Recent Outpatient Visits           2 weeks ago Osteoarthritis  of fingers of both hands   [place] [facility] Duanne Butler DASEN, MD   4 months ago Dysuria   [place] [facility] Aletha Bene, MD   4 months ago Pure hypercholesterolemia   [place] [facility] Duanne Butler DASEN, MD   7 months ago Viral upper respiratory tract infection   [place] [facility] Aletha Bene, MD   7 months ago Suspected UTI    [facility] Pickard, Butler DASEN, MD       Future Appointments             In 2 months Matilda Senior, MD [facility] [place]

## 2024-02-17 ENCOUNTER — Other Ambulatory Visit: Payer: Self-pay | Admitting: Family Medicine

## 2024-02-17 DIAGNOSIS — R058 Other specified cough: Secondary | ICD-10-CM

## 2024-02-17 DIAGNOSIS — R053 Chronic cough: Secondary | ICD-10-CM

## 2024-02-19 NOTE — Telephone Encounter (Signed)
 Requested Prescriptions  Pending Prescriptions Disp Refills   montelukast  (SINGULAIR ) 10 MG tablet [Pharmacy Med Name: MONTELUKAST  10MG  TABLETS] 90 tablet 0    Sig: TAKE 1 TABLET BY MOUTH AT BEDTIME     Pulmonology:  Leukotriene Inhibitors Passed - 02/19/2024  8:54 AM      Passed - Valid encounter within last 12 months    Recent Outpatient Visits           2 weeks ago Osteoarthritis of fingers of both hands   [place] [facility] Pickard, Butler DASEN, MD   4 months ago Dysuria   [place] [facility] Aletha Bene, MD   5 months ago Pure hypercholesterolemia   [place] [facility] Duanne Butler DASEN, MD   7 months ago Viral upper respiratory tract infection   [place] [facility] Aletha Bene, MD   8 months ago Suspected UTI    [place] [facility] Pickard, Butler DASEN, MD       Future Appointments             In 1 month Matilda Senior, MD [facility] [place]

## 2024-02-26 ENCOUNTER — Other Ambulatory Visit: Payer: Self-pay | Admitting: Family Medicine

## 2024-02-26 DIAGNOSIS — R058 Other specified cough: Secondary | ICD-10-CM

## 2024-02-26 DIAGNOSIS — R053 Chronic cough: Secondary | ICD-10-CM

## 2024-02-28 DIAGNOSIS — K115 Sialolithiasis: Secondary | ICD-10-CM | POA: Diagnosis not present

## 2024-02-28 DIAGNOSIS — K1120 Sialoadenitis, unspecified: Secondary | ICD-10-CM | POA: Diagnosis not present

## 2024-02-28 DIAGNOSIS — K134 Granuloma and granuloma-like lesions of oral mucosa: Secondary | ICD-10-CM | POA: Diagnosis not present

## 2024-03-03 ENCOUNTER — Telehealth: Payer: Self-pay | Admitting: Family Medicine

## 2024-03-03 NOTE — Telephone Encounter (Signed)
 Copied from CRM [id number]. Topic: General - Billing Inquiry >> Jan 21, 2024  9:10 AM Wess RAMAN wrote: Reason for CRM: Patient states she is receiving a bill that should have been covered by Medicare for her annual physical. She believes it was coded incorrectly.  Callback #: [id number]  Please advise patient.

## 2024-03-16 DIAGNOSIS — L814 Other melanin hyperpigmentation: Secondary | ICD-10-CM | POA: Diagnosis not present

## 2024-03-16 DIAGNOSIS — L570 Actinic keratosis: Secondary | ICD-10-CM | POA: Diagnosis not present

## 2024-03-16 DIAGNOSIS — L821 Other seborrheic keratosis: Secondary | ICD-10-CM | POA: Diagnosis not present

## 2024-03-16 DIAGNOSIS — Z85828 Personal history of other malignant neoplasm of skin: Secondary | ICD-10-CM | POA: Diagnosis not present

## 2024-03-16 DIAGNOSIS — B079 Viral wart, unspecified: Secondary | ICD-10-CM | POA: Diagnosis not present

## 2024-03-16 DIAGNOSIS — D485 Neoplasm of uncertain behavior of skin: Secondary | ICD-10-CM | POA: Diagnosis not present

## 2024-03-17 ENCOUNTER — Ambulatory Visit: Admitting: Urology

## 2024-03-23 ENCOUNTER — Other Ambulatory Visit

## 2024-03-23 DIAGNOSIS — R5383 Other fatigue: Secondary | ICD-10-CM

## 2024-03-23 DIAGNOSIS — E7800 Pure hypercholesterolemia, unspecified: Secondary | ICD-10-CM

## 2024-03-23 DIAGNOSIS — Z8673 Personal history of transient ischemic attack (TIA), and cerebral infarction without residual deficits: Secondary | ICD-10-CM | POA: Diagnosis not present

## 2024-03-23 DIAGNOSIS — Z0000 Encounter for general adult medical examination without abnormal findings: Secondary | ICD-10-CM

## 2024-03-24 ENCOUNTER — Ambulatory Visit: Payer: Self-pay | Admitting: Family Medicine

## 2024-03-24 LAB — COMPLETE METABOLIC PANEL WITHOUT GFR
AG Ratio: 1.9 (calc) (ref 1.0–2.5)
ALT: 13 U/L (ref 6–29)
AST: 21 U/L (ref 10–35)
Albumin: 4.2 g/dL (ref 3.6–5.1)
Alkaline phosphatase (APISO): 61 U/L (ref 37–153)
BUN: 15 mg/dL (ref 7–25)
CO2: 32 mmol/L (ref 20–32)
Calcium: 9.1 mg/dL (ref 8.6–10.4)
Chloride: 104 mmol/L (ref 98–110)
Creat: 0.71 mg/dL (ref 0.60–1.00)
Globulin: 2.2 g/dL (ref 1.9–3.7)
Glucose, Bld: 94 mg/dL (ref 65–99)
Potassium: 4.4 mmol/L (ref 3.5–5.3)
Sodium: 142 mmol/L (ref 135–146)
Total Bilirubin: 0.9 mg/dL (ref 0.2–1.2)
Total Protein: 6.4 g/dL (ref 6.1–8.1)

## 2024-03-24 LAB — CBC WITH DIFFERENTIAL/PLATELET
Absolute Lymphocytes: 1140 {cells}/uL (ref 850–3900)
Absolute Monocytes: 447 {cells}/uL (ref 200–950)
Basophils Absolute: 21 {cells}/uL (ref 0–200)
Basophils Relative: 0.5 %
Eosinophils Absolute: 49 {cells}/uL (ref 15–500)
Eosinophils Relative: 1.2 %
HCT: 41.9 % (ref 35.0–45.0)
Hemoglobin: 13.8 g/dL (ref 11.7–15.5)
MCH: 31.6 pg (ref 27.0–33.0)
MCHC: 32.9 g/dL (ref 32.0–36.0)
MCV: 95.9 fL (ref 80.0–100.0)
MPV: 10.4 fL (ref 7.5–12.5)
Monocytes Relative: 10.9 %
Neutro Abs: 2444 {cells}/uL (ref 1500–7800)
Neutrophils Relative %: 59.6 %
Platelets: 189 Thousand/uL (ref 140–400)
RBC: 4.37 Million/uL (ref 3.80–5.10)
RDW: 12.4 % (ref 11.0–15.0)
Total Lymphocyte: 27.8 %
WBC: 4.1 Thousand/uL (ref 3.8–10.8)

## 2024-03-24 LAB — LIPID PANEL
Cholesterol: 158 mg/dL (ref <200)
HDL: 56 mg/dL (ref >=50)
LDL Cholesterol (Calc): 78 mg/dL
Non-HDL Cholesterol (Calc): 102 mg/dL (ref <130)
Total CHOL/HDL Ratio: 2.8 (calc) (ref <5.0)
Triglycerides: 140 mg/dL (ref <150)

## 2024-03-30 ENCOUNTER — Ambulatory Visit: Admitting: Family Medicine

## 2024-03-30 VITALS — BP 136/74 | HR 87 | Temp 98.7°F | Ht 65.0 in | Wt 137.0 lb

## 2024-03-30 DIAGNOSIS — K219 Gastro-esophageal reflux disease without esophagitis: Secondary | ICD-10-CM

## 2024-03-30 NOTE — Progress Notes (Signed)
 Subjective:    Patient ID: Caroline Valdez, female    DOB: 06-Feb-1951, 73 y.o.   MRN: [id number]  Recently I started the patient on meloxicam  for arthritic pain in her hands.  Approximately 2 weeks ago she developed burning pain in her epigastric area.  She denies any melena or hematochezia or fever or chills or weight loss.  EGD was normal in August.  She is currently on pantoprazole  and famotidine .  She has tried numerous PPIs in the past and still had breakthrough reflux Past Medical History:  Diagnosis Date  . Allergy 2015?  SABRA Anxiety 2020?   Nighttime panic attacks  . Arthritis   . Blood dyscrasia    Von Willebrand factor   . Blood transfusion without reported diagnosis 1967, 1974  . Breast cancer (HCC)    left  . Clotting disorder Von Willenbrand  . Complication of anesthesia    1980S  TUBAL LIG, + HEMM SURGERY PT PASSED OUT AND WENT INTO SHOCK.  NO PROBLEMS SINCE   . 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 Endometriosis   . Fibromyalgia   . GERD (gastroesophageal reflux disease)   . Hypercholesteremia   . Hypertension   . Mini stroke 09/2012  . Stroke [facility])    MINI STROKE  . Von Willebrand disease (HCC) 09/11/2013   Past Surgical History:  Procedure Laterality Date  . ABDOMINAL HYSTERECTOMY    . AUGMENTATION MAMMAPLASTY Right   . BACK SURGERY    . BREAST IMPLANT REMOVAL Right 12/05/2017  . BREAST IMPLANT REMOVAL Right 12/05/2017   Procedure: REMOVAL RIGHT  BREAST IMPLANT MATERIAL;  Surgeon: Leora Lenis, MD;  Location: [facility] OR;  Service: Plastics;  Laterality: Right;  . BREAST RECONSTRUCTION    . BREAST RECONSTRUCTION Right 12/05/2017   Procedure: DELAYED BREAST RECONSTRUCTION WITH SILICONE IMPLANT;  Surgeon: Leora Lenis, MD;  Location: [facility] OR;  Service: Plastics;  Laterality: Right;  . CARPAL TUNNEL RELEASE     right  . COLONOSCOPY  01/2010   Dr. Oneil Budge: normal  . COLONOSCOPY WITH PROPOFOL  N/A 01/22/2019   Procedure: COLONOSCOPY WITH PROPOFOL ;   Surgeon: Shaaron [name], MD;  Location: [facility] SUITE;  Service: Endoscopy;  Laterality: N/A;  2:30pm - can not come earlier due to transportation  . COSMETIC SURGERY  2011,   Breast  . ESOPHAGOGASTRODUODENOSCOPY N/A 12/26/2023   Procedure: EGD (ESOPHAGOGASTRODUODENOSCOPY);  Surgeon: Shaaron [name], MD;  Location: [facility] SUITE;  Service: Endoscopy;  Laterality: N/A;  1230pm, asa 2  . FINGER SURGERY     LEFT THUMB  . HEMORRHOID SURGERY     X2  . LAMINECTOMY    . MASTECTOMY     left  . SHOULDER ARTHROSCOPY WITH ROTATOR CUFF REPAIR Right 08/28/2023   Procedure: REVISION MINI-OPEN ROTATOR CUFF REPAIR,RIGHT SHOULDER, WITH PATCH GRAFT;  Surgeon: Duwayne Purchase, MD;  Location: [facility] ORS;  Service: Orthopedics;  Laterality: Right;  Revision right shoulder mini open rotator cuff repair with patch graft  . SHOULDER SURGERY Right   . SKIN CANCER EXCISION     nasal basal cell lesion,    Mohs surgery  . SPINE SURGERY  1989  . TONSILLECTOMY    . TUBAL LIGATION     Current Outpatient Medications on File Prior to Visit  Medication Sig Dispense Refill  . acetaminophen  (TYLENOL ) 500 MG tablet Take 1,000 mg by mouth every 6 (six) hours as needed for headache or moderate pain (pain score 4-6).    . ALPRAZolam  (  XANAX ) 0.5 MG tablet TAKE 1 TABLET(0.5 MG) BY MOUTH AT BEDTIME AS NEEDED 60 tablet 3  . aspirin EC 81 MG tablet Take 81 mg by mouth in the morning. Swallow whole.    . estradiol  (ESTRACE ) 0.1 MG/GM vaginal cream Discard plastic applicator. Insert a blueberry size amount (approximately 1 gram) of cream on fingertip inside vagina at bedtime every night for 1 week then every other night. For long term use. 30 g 3  . famotidine  (PEPCID ) 20 MG tablet TAKE 1 TABLET BY MOUTH AT LEAST 1 HOUR BEFORE BEDTIME 30 tablet 11  . fluticasone  (FLONASE ) 50 MCG/ACT nasal spray Place 2 sprays into both nostrils daily. 48 g 3  . montelukast  (SINGULAIR ) 10 MG tablet TAKE 1 TABLET BY MOUTH AT BEDTIME 90 tablet 0  . Multiple  Vitamin (MULTIVITAMIN WITH MINERALS) TABS tablet Take 1 tablet by mouth in the morning.    . Multiple Vitamins-Minerals (EQ VISION FORMULA 50+ PO) Take 1 tablet by mouth daily.    . ondansetron  (ZOFRAN ) 4 MG tablet TAKE 1 TABLET(4 MG) BY MOUTH EVERY 8 HOURS AS NEEDED FOR NAUSEA OR VOMITING 20 tablet 0  . pantoprazole  (PROTONIX ) 40 MG tablet TAKE 1 TABLET(40 MG) BY MOUTH TWICE DAILY 180 tablet 2  . Plecanatide  (TRULANCE ) 3 MG TABS Take 1 tablet (3 mg total) by mouth daily. 90 tablet 3  . rosuvastatin  (CRESTOR ) 20 MG tablet TAKE 1 TABLET(20 MG) BY MOUTH DAILY 90 tablet 2  . traMADol  (ULTRAM ) 50 MG tablet Take 1 tablet (50 mg total) by mouth every 6 (six) hours as needed. 60 tablet 0   No current facility-administered medications on file prior to visit.   Allergies  Allergen Reactions  . Aspirin Other (See Comments)    Pt has Von Willebrands Disease  . Hydrocodone  Itching  . Oxycodone  Itching  . Tape Dermatitis    Pulls off skin   Social History   Socioeconomic History  . Marital status: Widowed    Spouse name: Not on file  . Number of children: 1  . Years of education: Not on file  . Highest education level: 12th grade  Occupational History  . Not on file  Tobacco Use  . Smoking status: Never  . Smokeless tobacco: Never  Vaping Use  . Vaping status: Never Used  Substance and Sexual Activity  . Alcohol use: No  . Drug use: No  . Sexual activity: Not Currently    Comment: husband recently died from myelodysplastic syndrome in January of 2014  Other Topics Concern  . Not on file  Social History Narrative   1 Daughter, April   1 grandson-20, 1 granddaughter-4.   Keeps granddaughter during the week.   Social Drivers of Health   Financial Resource Strain: Patient Declined (03/30/2024)   Overall Financial Resource Strain (CARDIA)   . Difficulty of Paying Living Expenses: Patient declined  Food Insecurity: Patient Declined (03/30/2024)   Hunger Vital Sign   . Worried About  Programme Researcher, Broadcasting/film/video in the Last Year: Patient declined   . Ran Out of Food in the Last Year: Patient declined  Transportation Needs: No Transportation Needs (03/30/2024)   PRAPARE - Transportation   . Lack of Transportation (Medical): No   . Lack of Transportation (Non-Medical): No  Physical Activity: Insufficiently Active (03/30/2024)   Exercise Vital Sign   . Days of Exercise per Week: 3 days   . Minutes of Exercise per Session: 20 min  Stress: No Stress Concern Present (03/30/2024)  [employer] of Occupational Health - Occupational Stress Questionnaire   . Feeling of Stress: Only a little  Recent Concern: Stress - Stress Concern Present (01/29/2024)   [employer] of Occupational Health - Occupational Stress Questionnaire   . Feeling of Stress: To some extent  Social Connections: Unknown (03/30/2024)   Social Connection and Isolation Panel   . Frequency of Communication with Friends and Family: Patient declined   . Frequency of Social Gatherings with Friends and Family: Patient declined   . Attends Religious Services: Patient declined   . Active Member of Clubs or Organizations: Patient declined   . Attends Banker Meetings: Not on file   . Marital Status: Widowed  Intimate Partner Violence: Not At Risk (06/18/2023)   Humiliation, Afraid, Rape, and Kick questionnaire   . Fear of Current or Ex-Partner: No   . Emotionally Abused: No   . Physically Abused: No   . Sexually Abused: No   Family History  Problem Relation Age of Onset  . Breast cancer Mother 78       now 60yo  . Arthritis Mother   . Cancer Mother   . Hearing loss Mother   . Vision loss Mother   . Varicose Veins Mother   . Stroke Father        Father had no full sisters  . Early death Father   . Hearing loss Father   . Breast cancer Sister 98       now 68yo; had TAH/BSO  . Cancer Sister   . Hyperlipidemia Sister   . Varicose Veins Sister   . Lung cancer Brother        died 51yo;  smoker  . Asthma Daughter      Review of Systems  All other systems reviewed and are negative.      Objective:   Physical Exam Vitals reviewed.  Constitutional:      General: She is not in acute distress.    Appearance: She is well-developed. She is not diaphoretic.  HENT:     Head: Normocephalic and atraumatic.     Right Ear: Tympanic membrane and ear canal normal.     Left Ear: Tympanic membrane and ear canal normal.     Nose:     Right Turbinates: Not enlarged.     Left Turbinates: Not enlarged.     Right Sinus: No maxillary sinus tenderness or frontal sinus tenderness.     Left Sinus: No maxillary sinus tenderness or frontal sinus tenderness.  Eyes:     Conjunctiva/sclera: Conjunctivae normal.  Cardiovascular:     Rate and Rhythm: Normal rate and regular rhythm.     Heart sounds: Normal heart sounds. No murmur heard.    No friction rub. No gallop.  Pulmonary:     Effort: Pulmonary effort is normal. No respiratory distress.     Breath sounds: Normal breath sounds. No wheezing or rales.  Chest:     Chest wall: No tenderness.  Abdominal:     General: Abdomen is flat. Bowel sounds are normal.     Palpations: Abdomen is soft.     Tenderness: There is no abdominal tenderness.           Assessment & Plan:  Gastroesophageal reflux disease, unspecified whether esophagitis present Discontinue all NSAIDs.  Replace pantoprazole  with Voquenza 20 mg daily and recheck in 2 weeks.  Abdomen is soft nondistended nontender with normal bowel sounds

## 2024-03-31 ENCOUNTER — Encounter: Payer: Self-pay | Admitting: Family Medicine

## 2024-04-02 DIAGNOSIS — M25511 Pain in right shoulder: Secondary | ICD-10-CM | POA: Diagnosis not present

## 2024-04-10 ENCOUNTER — Other Ambulatory Visit: Payer: Self-pay | Admitting: Gastroenterology

## 2024-04-10 DIAGNOSIS — K5904 Chronic idiopathic constipation: Secondary | ICD-10-CM

## 2024-04-13 NOTE — Progress Notes (Unsigned)
 History of Present Illness:  This lady is here for continued attention to recurrent UTIs. First seen 7.25.2025 by GORMAN Oz, NP.  Prior to her visit in July, she apparently had 6 or 7 UTIs over 65-month period.  She was put on a vaginal estrogen cream as well as 3 months of trimethoprim  once a day.  She has not had any issues since that time.  She uses her Estrace  cream 3 days a week.  Past Medical History:  Diagnosis Date   Allergy 2015?   Anxiety 2020?   Nighttime panic attacks   Arthritis    Blood dyscrasia    Von Willebrand factor    Blood transfusion without reported diagnosis 1967, 1974   Breast cancer (HCC)    left   Clotting disorder Von Willenbrand   Complication of anesthesia    1980S  TUBAL LIG, + HEMM SURGERY PT PASSED OUT AND WENT INTO SHOCK.  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    Endometriosis    Fibromyalgia    GERD (gastroesophageal reflux disease)    Hypercholesteremia    Hypertension    Mini stroke 09/2012   Stroke [facility])    MINI STROKE   Von Willebrand disease (HCC) 09/11/2013    Past Surgical History:  Procedure Laterality Date   ABDOMINAL HYSTERECTOMY     AUGMENTATION MAMMAPLASTY Right    BACK SURGERY     BREAST IMPLANT REMOVAL Right 12/05/2017   BREAST IMPLANT REMOVAL Right 12/05/2017   Procedure: REMOVAL RIGHT  BREAST IMPLANT MATERIAL;  Surgeon: Leora Lenis, MD;  Location: [facility] OR;  Service: Plastics;  Laterality: Right;   BREAST RECONSTRUCTION     BREAST RECONSTRUCTION Right 12/05/2017   Procedure: DELAYED BREAST RECONSTRUCTION WITH SILICONE IMPLANT;  Surgeon: Leora Lenis, MD;  Location: [facility] OR;  Service: Plastics;  Laterality: Right;   CARPAL TUNNEL RELEASE     right   COLONOSCOPY  01/2010   Dr. Oneil Budge: normal   COLONOSCOPY WITH PROPOFOL  N/A 01/22/2019   Procedure: COLONOSCOPY WITH PROPOFOL ;  Surgeon: Shaaron [name], MD;  Location: [facility];  Service: Endoscopy;  Laterality: N/A;  2:30pm - can  not come earlier due to transportation   COSMETIC SURGERY  2011,   Breast   ESOPHAGOGASTRODUODENOSCOPY N/A 12/26/2023   Procedure: EGD (ESOPHAGOGASTRODUODENOSCOPY);  Surgeon: Shaaron [name], MD;  Location: [facility];  Service: Endoscopy;  Laterality: N/A;  1230pm, asa 2   FINGER SURGERY     LEFT THUMB   HEMORRHOID SURGERY     X2   LAMINECTOMY     MASTECTOMY     left   SHOULDER ARTHROSCOPY WITH ROTATOR CUFF REPAIR Right 08/28/2023   Procedure: REVISION MINI-OPEN ROTATOR CUFF REPAIR,RIGHT SHOULDER, WITH PATCH GRAFT;  Surgeon: Duwayne Purchase, MD;  Location: [facility] ORS;  Service: Orthopedics;  Laterality: Right;  Revision right shoulder mini open rotator cuff repair with patch graft   SHOULDER SURGERY Right    SKIN CANCER EXCISION     nasal basal cell lesion,    Mohs surgery   SPINE SURGERY  1989   TONSILLECTOMY     TUBAL LIGATION      Home Medications:  (Not in a hospital admission)   Allergies:  Allergies  Allergen Reactions   Aspirin Other (See Comments)    Pt has Von Willebrands Disease   Hydrocodone  Itching   Oxycodone  Itching   Tape Dermatitis    Pulls off skin  Family History  Problem Relation Age of Onset   Breast cancer Mother 38       now 29yo   Arthritis Mother    Cancer Mother    Hearing loss Mother    Vision loss Mother    Varicose Veins Mother    Stroke Father        Father had no full sisters   Early death Father    Hearing loss Father    Breast cancer Sister 59       now 69yo; had TAH/BSO   Cancer Sister    Hyperlipidemia Sister    Varicose Veins Sister    Lung cancer Brother        died 21yo; smoker   Asthma Daughter     Social History:  reports that she has never smoked. She has never used smokeless tobacco. She reports that she does not drink alcohol and does not use drugs.  ROS: A complete review of systems was performed.  All systems are negative except for pertinent findings as noted.  Physical Exam:  Vital signs in last 24  hours: @VSRANGES @ General:  Alert and oriented, No acute distress HEENT: Normocephalic, atraumatic Neck: No JVD or lymphadenopathy Cardiovascular: Regular rate  Lungs: Normal inspiratory/expiratory excursion Extremities: No edema Neurologic: Grossly intact  I have reviewed prior pt notes  I have reviewed urinalysis results--clear today I have reviewed prior urine culture   Impression/Assessment:  1.  Recurrent urinary tract infections, doing well in the past 4 months  2.  Atrophic vaginal changes, on estrogen cream  Plan:  1.  I told her to continue the Estrace  cream 2 nights a week long-term  2.  She will stay off the trimethoprim , I recommended she take vitamin C 500 mg nightly long-term  3.  Return as needed.  She will ask Dr. Duanne to refill her estrogen cream  Garnette HERO Harli Engelken 04/13/2024, 11:25 AM  Garnette HERO. Teresia Myint MD

## 2024-04-14 ENCOUNTER — Other Ambulatory Visit: Payer: Self-pay

## 2024-04-14 ENCOUNTER — Encounter: Payer: Self-pay | Admitting: Family Medicine

## 2024-04-14 ENCOUNTER — Ambulatory Visit: Admitting: Urology

## 2024-04-14 VITALS — BP 123/75 | HR 90 | Temp 98.1°F | Resp 16 | Ht 69.0 in | Wt 224.4 lb

## 2024-04-14 DIAGNOSIS — N390 Urinary tract infection, site not specified: Secondary | ICD-10-CM

## 2024-04-14 DIAGNOSIS — N952 Postmenopausal atrophic vaginitis: Secondary | ICD-10-CM | POA: Diagnosis not present

## 2024-04-14 DIAGNOSIS — Z87440 Personal history of urinary (tract) infections: Secondary | ICD-10-CM

## 2024-04-14 DIAGNOSIS — K219 Gastro-esophageal reflux disease without esophagitis: Secondary | ICD-10-CM

## 2024-04-14 LAB — URINALYSIS, ROUTINE W REFLEX MICROSCOPIC
Bilirubin, UA: NEGATIVE
Glucose, UA: NEGATIVE
Ketones, UA: NEGATIVE
Nitrite, UA: NEGATIVE
Protein,UA: NEGATIVE
Specific Gravity, UA: 1.01 (ref 1.005–1.030)
Urobilinogen, Ur: 0.2 mg/dL (ref 0.2–1.0)
pH, UA: 6 (ref 5.0–7.5)

## 2024-04-14 LAB — MICROSCOPIC EXAMINATION: Bacteria, UA: NONE SEEN

## 2024-04-15 ENCOUNTER — Other Ambulatory Visit: Payer: Self-pay

## 2024-04-15 MED ORDER — PANTOPRAZOLE SODIUM 40 MG PO TBEC: 40.0000 mg | DELAYED_RELEASE_TABLET | Freq: Two times a day (BID) | ORAL | 2 refills | Status: AC

## 2024-04-21 ENCOUNTER — Encounter: Payer: Self-pay | Admitting: Urology

## 2024-04-21 NOTE — Telephone Encounter (Signed)
 FYI and advise

## 2024-04-29 DIAGNOSIS — M25511 Pain in right shoulder: Secondary | ICD-10-CM | POA: Diagnosis not present

## 2024-05-08 ENCOUNTER — Other Ambulatory Visit: Payer: Self-pay | Admitting: Family Medicine

## 2024-05-08 ENCOUNTER — Other Ambulatory Visit: Payer: Self-pay

## 2024-05-08 MED ORDER — TRAMADOL HCL 50 MG PO TABS: 50.0000 mg | ORAL_TABLET | Freq: Four times a day (QID) | ORAL | 0 refills | Status: DC | PRN

## 2024-05-08 MED ORDER — ONDANSETRON HCL 4 MG PO TABS: 4.0000 mg | ORAL_TABLET | Freq: Three times a day (TID) | ORAL | 0 refills | Status: DC | PRN

## 2024-05-11 ENCOUNTER — Encounter: Payer: Self-pay | Admitting: Family Medicine

## 2024-05-11 ENCOUNTER — Other Ambulatory Visit: Payer: Self-pay

## 2024-05-11 DIAGNOSIS — K219 Gastro-esophageal reflux disease without esophagitis: Secondary | ICD-10-CM

## 2024-05-11 MED ORDER — ONDANSETRON HCL 4 MG PO TABS: 4.0000 mg | ORAL_TABLET | Freq: Three times a day (TID) | ORAL | 0 refills | Status: AC | PRN

## 2024-05-12 ENCOUNTER — Other Ambulatory Visit: Payer: Self-pay | Admitting: Family Medicine

## 2024-05-12 MED ORDER — TRAMADOL HCL 50 MG PO TABS: 50.0000 mg | ORAL_TABLET | Freq: Four times a day (QID) | ORAL | 0 refills | Status: DC | PRN

## 2024-06-09 ENCOUNTER — Inpatient Hospital Stay: Attending: Oncology

## 2024-06-09 ENCOUNTER — Other Ambulatory Visit: Payer: Self-pay

## 2024-06-09 DIAGNOSIS — D6800 Von Willebrand disease, unspecified: Secondary | ICD-10-CM

## 2024-06-09 DIAGNOSIS — D6801 Von willebrand disease, type 1: Secondary | ICD-10-CM

## 2024-06-09 LAB — CBC WITH DIFFERENTIAL/PLATELET
Abs Immature Granulocytes: 0.01 K/uL (ref 0.00–0.07)
Basophils Absolute: 0 K/uL (ref 0.0–0.1)
Basophils Relative: 0 %
Eosinophils Absolute: 0.2 K/uL (ref 0.0–0.5)
Eosinophils Relative: 3 %
HCT: 42.7 % (ref 36.0–46.0)
Hemoglobin: 14.3 g/dL (ref 12.0–15.0)
Immature Granulocytes: 0 %
Lymphocytes Relative: 22 %
Lymphs Abs: 1.1 K/uL (ref 0.7–4.0)
MCH: 31.5 pg (ref 26.0–34.0)
MCHC: 33.5 g/dL (ref 30.0–36.0)
MCV: 94.1 fL (ref 80.0–100.0)
Monocytes Absolute: 0.4 K/uL (ref 0.1–1.0)
Monocytes Relative: 8 %
Neutro Abs: 3.4 K/uL (ref 1.7–7.7)
Neutrophils Relative %: 67 %
Platelets: 204 K/uL (ref 150–400)
RBC: 4.54 MIL/uL (ref 3.87–5.11)
RDW: 12.4 % (ref 11.5–15.5)
WBC: 5.2 K/uL (ref 4.0–10.5)
nRBC: 0 % (ref 0.0–0.2)

## 2024-06-09 LAB — PROTIME-INR
INR: 1 (ref 0.8–1.2)
Prothrombin Time: 14.1 s (ref 11.4–15.2)

## 2024-06-09 LAB — FIBRINOGEN: Fibrinogen: 380 mg/dL (ref 210–475)

## 2024-06-09 LAB — APTT: aPTT: 28 s (ref 24–36)

## 2024-06-11 LAB — VON WILLEBRAND PANEL
Coagulation Factor VIII: 129 % (ref 56–140)
Ristocetin Co-factor, Plasma: 138 % (ref 50–200)
Von Willebrand Antigen, Plasma: 136 % (ref 50–200)

## 2024-06-11 LAB — COAG STUDIES INTERP REPORT

## 2024-06-16 ENCOUNTER — Inpatient Hospital Stay: Admitting: Oncology

## 2024-06-16 DIAGNOSIS — D6800 Von Willebrand disease, unspecified: Secondary | ICD-10-CM

## 2024-06-16 NOTE — Progress Notes (Signed)
 "  [facility] OFFICE PROGRESS NOTE  Duanne Butler DASEN, MD  ASSESSMENT & PLAN:  I connected with Yasenia Reedy Schuchard on 06/16/24 at 10:00 AM EST by telephone visit and verified that I am speaking with the correct person using two identifiers.   I discussed the limitations, risks, security and privacy concerns of performing an evaluation and management service by telemedicine and the availability of in-person appointments. I also discussed with the patient that there may be a patient responsible charge related to this service. The patient expressed understanding and agreed to proceed.   Other persons participating in the visit and their role in the encounter: NP, Patient    Patients location: Home   Providers location: Home   Assessment & Plan Von Willebrand disease (HCC) - She was referred by Dr. Duwayne for optimization prior to planned Ortho surgery. - We reviewed labs from PT, PTT, fibrinogen  levels were normal.  VWF antigen and ristocetin cofactor activity were normal. - Because of her prior history, I have recommended DDAVP  0.3 mcg/kg given over 15-30 minutes, 60 minutes prior to anticipated procedure. -Clearance form sent to Ortho- Wheeler Senters, RN faxed to Dr. Ronie [facility].  - RTC as needed.   No orders of the defined types were placed in this encounter.   INTERVAL HISTORY: Patient returns for follow-up for history of clinical von Willebrand's.  Patient is to have additional orthopedic surgery with Dr. Duwayne and needs clearance.  It was recommended at that time she received DDAVP  0.3 mcg/kg given over 50 to 30 minutes 60 minutes prior to anticipated procedure to prevent complications with surgery.  We reviewed labs from 06/09/2024.  SUMMARY OF HEMATOLOGIC HISTORY: Oncology History  Ductal carcinoma in situ (DCIS) of left breast  04/2009 Initial Diagnosis   Left breast DCIS with microinvasion status post left mastectomy and reconstruction together with right breast  reduction and implant, ER positive PR negative, 2 cm size, Tis NX stage 0; patient did not want antiestrogen therapy     1.  Clinical VW disease: - She carries a diagnosis of von Willebrand's disease.  She was reportedly seen by Dr. Sharyne Killian at [facility] - History of major hemorrhage after tonsillectomy at age 32 requiring blood transfusion.  Also had hemorrhage after normal vaginal delivery to 12 days after second childbirth.  She had to have packing done after every time she had tooth pulled.  Also reported major bleeding after hysterectomy in 1996. - History of receiving DDAVP  prior to most surgical procedures without any significant bleeding. - She denies any active bleeding.  Occasional bruising on legs.  She recently had 1 nosebleed while using Flonase . - She has been on aspirin 81 mg since 2014 after having minor stroke. - Normal factor IX, factor VIII activity, factor is 11 and 12 tested in May 2019.   2. Social/family History: -Lives independently. No tobacco use. -No family history of Von Willebrand's or other bleeding disorders. Sister and mother had breast cancer. Brother died of metastasized lung cancer due to agent orange exposure.    CBC    Component Value Date/Time   WBC 5.2 06/09/2024 1110   RBC 4.54 06/09/2024 1110   HGB 14.3 06/09/2024 1110   HGB 13.6 12/28/2022 1038   HCT 42.7 06/09/2024 1110   HCT 40.2 12/28/2022 1038   PLT 204 06/09/2024 1110   PLT 191 12/28/2022 1038   MCV 94.1 06/09/2024 1110   MCV 92 12/28/2022 1038   MCH 31.5 06/09/2024  1110   MCHC 33.5 06/09/2024 1110   RDW 12.4 06/09/2024 1110   RDW 12.5 12/28/2022 1038   LYMPHSABS 1.1 06/09/2024 1110   LYMPHSABS 1.5 12/28/2022 1038   MONOABS 0.4 06/09/2024 1110   EOSABS 0.2 06/09/2024 1110   EOSABS 0.1 12/28/2022 1038   BASOSABS 0.0 06/09/2024 1110   BASOSABS 0.0 12/28/2022 1038       Latest Ref Rng & Units 03/23/2024    8:08 AM 09/19/2023    8:52 AM 08/09/2023    9:31 AM  CMP  Glucose 65  - 99 mg/dL 94   92   BUN 7 - 25 mg/dL 15   16   Creatinine 9.39 - 1.00 mg/dL 9.28   9.27   Sodium 864 - 146 mmol/L 142   138   Potassium 3.5 - 5.3 mmol/L 4.4   3.5   Chloride 98 - 110 mmol/L 104   104   CO2 20 - 32 mmol/L 32   27   Calcium  8.6 - 10.4 mg/dL 9.1   9.3   Total Protein 6.1 - 8.1 g/dL 6.4  6.9    Total Bilirubin 0.2 - 1.2 mg/dL 0.9  0.8    AST 10 - 35 U/L 21  23    ALT 6 - 29 U/L 13  16       No results found for: FERRITIN, VITAMINB12  There were no vitals filed for this visit.  Review of System:  Review of Systems  Constitutional:  Negative for malaise/fatigue and weight loss.    Physical Exam: Physical Exam Neurological:     Mental Status: She is alert and oriented to person, place, and time.    I provided 18 minutes of non face-to-face telephone visit time during this encounter, and > 50% was spent counseling as documented under my assessment & plan.   Delon Hope, NP 06/16/2024 9:48 AM "

## 2024-06-16 NOTE — Assessment & Plan Note (Addendum)
-   She was referred by Dr. Duwayne for optimization prior to planned Ortho surgery. - We reviewed labs from PT, PTT, fibrinogen  levels were normal.  VWF antigen and ristocetin cofactor activity were normal. - Because of her prior history, I have recommended DDAVP  0.3 mcg/kg given over 15-30 minutes, 60 minutes prior to anticipated procedure. -Clearance form sent to Ortho- Wheeler Senters, RN faxed to Dr. [facility]ie [facility].  - RTC as needed.

## 2024-06-19 ENCOUNTER — Other Ambulatory Visit: Payer: Self-pay | Admitting: Family Medicine

## 2024-06-19 DIAGNOSIS — R058 Other specified cough: Secondary | ICD-10-CM

## 2024-06-19 DIAGNOSIS — R053 Chronic cough: Secondary | ICD-10-CM

## 2024-06-22 ENCOUNTER — Ambulatory Visit: Payer: Self-pay

## 2024-06-22 ENCOUNTER — Telehealth: Admitting: Nurse Practitioner

## 2024-06-22 ENCOUNTER — Ambulatory Visit
Admission: EM | Admit: 2024-06-22 | Discharge: 2024-06-22 | Disposition: A | Discharge status: Home / Self Care | Source: Home / Self Care

## 2024-06-22 DIAGNOSIS — N390 Urinary tract infection, site not specified: Secondary | ICD-10-CM

## 2024-06-22 DIAGNOSIS — R3989 Other symptoms and signs involving the genitourinary system: Secondary | ICD-10-CM

## 2024-06-22 LAB — POCT URINE DIPSTICK
Bilirubin, UA: NEGATIVE
Glucose, UA: NEGATIVE mg/dL
Ketones, POC UA: NEGATIVE mg/dL
Nitrite, UA: NEGATIVE
Spec Grav, UA: >=1.03 — AB
Urobilinogen, UA: 0.2 U/dL
pH, UA: 5.5

## 2024-06-22 MED ORDER — CEPHALEXIN 500 MG PO CAPS: 500.0000 mg | ORAL_CAPSULE | Freq: Two times a day (BID) | ORAL | 0 refills | Status: AC

## 2024-06-22 NOTE — ED Triage Notes (Signed)
 Pt reports burning with urination, difficulty urinating, frequency lower abdominal pressure, lower back pain x 1 day

## 2024-06-22 NOTE — Telephone Encounter (Signed)
 FYI Only or Action Required?: FYI only for provider: appointment scheduled on 06/23/24.  Patient was last seen in primary care on 03/30/2024 by Duanne Butler DASEN, MD.  Called Nurse Triage reporting Dysuria.  Symptoms began today.  Interventions attempted: Nothing.  Symptoms are: unchanged.  Triage Disposition: See Physician Within 24 Hours  Patient/caregiver understands and will follow disposition?: Yes  Reason for Disposition  Age > 50 years  More than 2 UTI's in last year  Answer Assessment - Initial Assessment Questions Pt reports c/o frequent urination, burning and lower abdominal pain that began this AM. Denies N/V/fever/hematuria. States possible flank pain. Scheduled next day ov but advised to also call her urologist. ED advised if symptoms worsen or if fever, hematuria, N/V develop. Pt verbalized understanding.  1. SEVERITY: How bad is the pain?  (e.g., Scale 1-10; mild, moderate, or severe)     4/10 2. FREQUENCY: How many times have you had painful urination today?      Continuous urge 4. ONSET: When did the painful urination start?      This AM 5. FEVER: Do you have a fever? If Yes, ask: What is your temperature, how was it measured, and when did it start?     Denies 6. PAST UTI: Have you had a urine infection before? If Yes, ask: When was the last time? and What happened that time?      Yes, chronic d/t vaginal atrophy 7. CAUSE: What do you think is causing the painful urination?  (e.g., UTI, scratch, Herpes sore)     UTI 8. OTHER SYMPTOMS: Do you have any other symptoms? (e.g., blood in urine, flank pain, genital sores, urgency, vaginal discharge)     Poss flank pain  Protocols used: Urination Pain - Female-A-AH Copied from CRM #[id number]. Topic: Clinical - Medication Question >> Jun 22, 2024 10:46 AM Tonda B wrote: Reason for CRM: patient is calling in with reoccurring uti and wants to know if she can get an rx please call pt back  [phone]

## 2024-06-23 ENCOUNTER — Ambulatory Visit: Admitting: Family Medicine

## 2024-06-23 ENCOUNTER — Ambulatory Visit (HOSPITAL_COMMUNITY): Payer: Self-pay

## 2024-06-23 ENCOUNTER — Encounter: Payer: Self-pay | Admitting: Family Medicine

## 2024-06-23 VITALS — BP 126/82 | HR 84 | Temp 97.8°F | Ht 65.0 in | Wt 136.0 lb

## 2024-06-23 DIAGNOSIS — R3989 Other symptoms and signs involving the genitourinary system: Secondary | ICD-10-CM

## 2024-06-23 LAB — MICROSCOPIC MESSAGE

## 2024-06-23 LAB — URINE CULTURE: Culture: NO GROWTH

## 2024-06-23 LAB — URINALYSIS, ROUTINE W REFLEX MICROSCOPIC
Bilirubin Urine: NEGATIVE
Glucose, UA: NEGATIVE
Ketones, ur: NEGATIVE
Nitrite: NEGATIVE
Protein, ur: NEGATIVE
Specific Gravity, Urine: 1.02 (ref 1.001–1.035)
pH: 5.5 (ref 5.0–8.0)

## 2024-06-23 NOTE — Progress Notes (Signed)
 "  Subjective:    Patient ID: Caroline Valdez, female    DOB: 1951-04-16, 74 y.o.   MRN: [id number] Yesterday, the patient went to an urgent care for urinary tract infection.  She reports dysuria, frequency, and urgency.  She was started on Cipro .  She states that she is doing 20% to 30% better.  She states that the frequency and urgency has improved.  She denies any fevers or chills or CVA tenderness.  However her urine culture was negative and the urgent care told her to stop her antibiotics.  Today on our urinalysis, the patient has +2 blood and +1 leukocyte esterase.  She denies any radiating pain.  She complains of dysuria and frequency and urgency.  Patient saw urology last year for frequent urinary tract infections and was started on topical Estrace .  She had not had a urinary tract infection in over 6 months after doing that Past Medical History:  Diagnosis Date   Allergy 2015?   Anxiety 2020?   Nighttime panic attacks   Arthritis    Blood dyscrasia    Von Willebrand factor    Blood transfusion without reported diagnosis 1967, 1974   Breast cancer (HCC)    left   Clotting disorder Von Willenbrand   Complication of anesthesia    1980S  TUBAL LIG, + HEMM SURGERY PT PASSED OUT AND WENT INTO SHOCK.  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    Endometriosis    Fibromyalgia    GERD (gastroesophageal reflux disease)    Hypercholesteremia    Hypertension    Mini stroke 09/2012   Stroke [facility])    MINI STROKE   Von Willebrand disease (HCC) 09/11/2013   Past Surgical History:  Procedure Laterality Date   ABDOMINAL HYSTERECTOMY     AUGMENTATION MAMMAPLASTY Right    BACK SURGERY     BREAST IMPLANT REMOVAL Right 12/05/2017   BREAST IMPLANT REMOVAL Right 12/05/2017   Procedure: REMOVAL RIGHT  BREAST IMPLANT MATERIAL;  Surgeon: Leora Lenis, MD;  Location: [facility] OR;  Service: Plastics;  Laterality: Right;   BREAST RECONSTRUCTION     BREAST RECONSTRUCTION Right  12/05/2017   Procedure: DELAYED BREAST RECONSTRUCTION WITH SILICONE IMPLANT;  Surgeon: Leora Lenis, MD;  Location: [facility] OR;  Service: Plastics;  Laterality: Right;   CARPAL TUNNEL RELEASE     right   COLONOSCOPY  01/2010   Dr. Oneil Budge: normal   COLONOSCOPY WITH PROPOFOL  N/A 01/22/2019   Procedure: COLONOSCOPY WITH PROPOFOL ;  Surgeon: Shaaron [name], MD;  Location: [facility];  Service: Endoscopy;  Laterality: N/A;  2:30pm - can not come earlier due to transportation   COSMETIC SURGERY  2011,   Breast   ESOPHAGOGASTRODUODENOSCOPY N/A 12/26/2023   Procedure: EGD (ESOPHAGOGASTRODUODENOSCOPY);  Surgeon: Shaaron [name], MD;  Location: [facility];  Service: Endoscopy;  Laterality: N/A;  1230pm, asa 2   FINGER SURGERY     LEFT THUMB   HEMORRHOID SURGERY     X2   LAMINECTOMY     MASTECTOMY     left   SHOULDER ARTHROSCOPY WITH ROTATOR CUFF REPAIR Right 08/28/2023   Procedure: REVISION MINI-OPEN ROTATOR CUFF REPAIR,RIGHT SHOULDER, WITH PATCH GRAFT;  Surgeon: Duwayne Purchase, MD;  Location: [facility] ORS;  Service: Orthopedics;  Laterality: Right;  Revision right shoulder mini open rotator cuff repair with patch graft   SHOULDER SURGERY Right    SKIN CANCER EXCISION     nasal basal cell lesion,  Mohs surgery   SPINE SURGERY  1989   TONSILLECTOMY     TUBAL LIGATION     Current Outpatient Medications on File Prior to Visit  Medication Sig Dispense Refill   acetaminophen  (TYLENOL ) 500 MG tablet Take 1,000 mg by mouth every 6 (six) hours as needed for headache or moderate pain (pain score 4-6).     ALPRAZolam  (XANAX ) 0.5 MG tablet TAKE 1 TABLET(0.5 MG) BY MOUTH AT BEDTIME AS NEEDED 60 tablet 3   aspirin EC 81 MG tablet Take 81 mg by mouth in the morning. Swallow whole.     cephALEXin  (KEFLEX ) 500 MG capsule Take 1 capsule (500 mg total) by mouth 2 (two) times daily. 10 capsule 0   estradiol  (ESTRACE ) 0.1 MG/GM vaginal cream Discard plastic applicator. Insert a blueberry size amount  (approximately 1 gram) of cream on fingertip inside vagina at bedtime every night for 1 week then every other night. For long term use. 30 g 3   famotidine  (PEPCID ) 20 MG tablet TAKE 1 TABLET BY MOUTH AT LEAST 1 HOUR BEFORE BEDTIME 30 tablet 11   fluticasone  (FLONASE ) 50 MCG/ACT nasal spray Place 2 sprays into both nostrils daily. 48 g 3   montelukast  (SINGULAIR ) 10 MG tablet TAKE 1 TABLET BY MOUTH AT BEDTIME 90 tablet 0   Multiple Vitamin (MULTIVITAMIN WITH MINERALS) TABS tablet Take 1 tablet by mouth in the morning.     Multiple Vitamins-Minerals (EQ VISION FORMULA 50+ PO) Take 1 tablet by mouth daily.     ondansetron  (ZOFRAN ) 4 MG tablet Take 1 tablet (4 mg total) by mouth every 8 (eight) hours as needed for nausea or vomiting. 20 tablet 0   pantoprazole  (PROTONIX ) 40 MG tablet Take 1 tablet (40 mg total) by mouth 2 (two) times daily. 180 tablet 2   rosuvastatin  (CRESTOR ) 20 MG tablet TAKE 1 TABLET(20 MG) BY MOUTH DAILY 90 tablet 2   traMADol  (ULTRAM ) 50 MG tablet Take 1 tablet (50 mg total) by mouth every 6 (six) hours as needed. 60 tablet 0   TRULANCE  3 MG TABS TAKE 1 TABLET DAILY 90 tablet 3   No current facility-administered medications on file prior to visit.   Allergies  Allergen Reactions   Aspirin Other (See Comments)    Pt has Von Willebrands Disease   Hydrocodone  Itching   Oxycodone  Itching   Tape Dermatitis    Pulls off skin   Social History   Socioeconomic History   Marital status: Widowed    Spouse name: Not on file   Number of children: 1   Years of education: Not on file   Highest education level: 12th grade  Occupational History   Not on file  Tobacco Use   Smoking status: Never   Smokeless tobacco: Never  Vaping Use   Vaping status: Never Used  Substance and Sexual Activity   Alcohol use: No   Drug use: No   Sexual activity: Not Currently    Comment: husband recently died from myelodysplastic syndrome in January of 2014  Other Topics Concern   Not on  file  Social History Narrative   1 Daughter, April   1 grandson-20, 1 granddaughter-4.   Keeps granddaughter during the week.   Social Drivers of Health   Tobacco Use: Low Risk (06/23/2024)   Patient History    Smoking Tobacco Use: Never    Smokeless Tobacco Use: Never    Passive Exposure: Not on file  Financial Resource Strain: Patient Declined (03/30/2024)   Overall  Financial Resource Strain (CARDIA)    Difficulty of Paying Living Expenses: Patient declined  Food Insecurity: Patient Declined (03/30/2024)   Epic    Worried About Programme Researcher, Broadcasting/film/video in the Last Year: Patient declined    Barista in the Last Year: Patient declined  Transportation Needs: No Transportation Needs (03/30/2024)   Epic    Lack of Transportation (Medical): No    Lack of Transportation (Non-Medical): No  Physical Activity: Insufficiently Active (03/30/2024)   Exercise Vital Sign    Days of Exercise per Week: 3 days    Minutes of Exercise per Session: 20 min  Stress: No Stress Concern Present (03/30/2024)   [employer] of Occupational [employer] - Occupational Stress Questionnaire    Feeling of Stress: Only a little  Recent Concern: Stress - Stress Concern Present (01/29/2024)   [employer] of Occupational [employer] - Occupational Stress Questionnaire    Feeling of Stress: To some extent  Social Connections: Unknown (03/30/2024)   Social Connection and Isolation Panel    Frequency of Communication with Friends and Family: Patient declined    Frequency of Social Gatherings with Friends and Family: Patient declined    Attends Religious Services: Patient declined    Database Administrator or Organizations: Patient declined    Attends Banker Meetings: Not on file    Marital Status: Widowed  Intimate Partner Violence: Not At Risk (06/18/2023)   Humiliation, Afraid, Rape, and Kick questionnaire    Fear of Current or Ex-Partner: No    Emotionally Abused: No    Physically Abused: No     Sexually Abused: No  Depression (PHQ2-9): Low Risk (09/19/2023)   Depression (PHQ2-9)    PHQ-2 Score: 0  Alcohol Screen: Low Risk (09/08/2021)   Alcohol Screen    Last Alcohol Screening Score (AUDIT): 0  Housing: Patient Declined (03/30/2024)   Epic    Unable to Pay for Housing in the Last Year: Patient declined    Number of Times Moved in the Last Year: Not on file    Homeless in the Last Year: Patient declined  Utilities: Not At Risk (06/18/2023)   [facility] Utilities    Threatened with loss of utilities: No  Health Literacy: Not on file   Family History  Problem Relation Age of Onset   Breast cancer Mother 67       now 58yo   Arthritis Mother    Cancer Mother    Hearing loss Mother    Vision loss Mother    Varicose Veins Mother    Stroke Father        Father had no full sisters   Early death Father    Hearing loss Father    Breast cancer Sister 91       now 58yo; had TAH/BSO   Cancer Sister    Hyperlipidemia Sister    Varicose Veins Sister    Lung cancer Brother        died 74yo; smoker   Asthma Daughter      Review of Systems  All other systems reviewed and are negative.      Objective:   Physical Exam Vitals reviewed.  Constitutional:      General: She is not in acute distress.    Appearance: She is well-developed. She is not diaphoretic.  HENT:     Head: Normocephalic and atraumatic.     Right Ear: Tympanic membrane and ear canal normal.     Left Ear: Tympanic  membrane and ear canal normal.     Nose:     Right Turbinates: Not enlarged.     Left Turbinates: Not enlarged.     Right Sinus: No maxillary sinus tenderness or frontal sinus tenderness.     Left Sinus: No maxillary sinus tenderness or frontal sinus tenderness.  Eyes:     Conjunctiva/sclera: Conjunctivae normal.  Cardiovascular:     Rate and Rhythm: Normal rate and regular rhythm.     Heart sounds: Normal heart sounds. No murmur heard.    No friction rub. No gallop.  Pulmonary:     Effort:  Pulmonary effort is normal. No respiratory distress.     Breath sounds: Normal breath sounds. No wheezing or rales.  Chest:     Chest wall: No tenderness.           Assessment & Plan:  Suspected UTI - Plan: Urine Culture, Urinalysis, Routine w reflex microscopic Patient has hematuria.  Differential diagnosis includes kidney stone, urinary tract infection, interstitial cystitis.  She has never had a cystoscopy.  I will repeat a urine culture.  She is already improving on Cipro  so I have asked her to finish the course of the antibiotics.  If she continues to improve by tomorrow, I would simply have her finish the antibiotics.  It is possible that the urine culture could be incorrect.  If symptoms do not improve, and urine culture is negative, I would recommend evaluation for hematuria with a CT scan of the abdomen and pelvis and a possible cystoscopy.  However I feel that it is most likely a urinary tract infection because the symptoms began suddenly 2 days ago without warning "

## 2024-06-24 ENCOUNTER — Encounter: Payer: Self-pay | Admitting: Family Medicine

## 2024-06-24 LAB — URINE CULTURE
MICRO NUMBER:: 17542747
SPECIMEN QUALITY:: ADEQUATE

## 2024-06-26 ENCOUNTER — Encounter: Payer: Self-pay | Admitting: Family Medicine

## 2024-09-22 ENCOUNTER — Encounter: Admitting: Family Medicine
